# Patient Record
Sex: Female | Born: 1945 | Race: White | Hispanic: No | Smoking: Never smoker
Health system: Southern US, Community
[De-identification: ages and names within clinical notes are randomized; demographics above are authoritative.]

## PROBLEM LIST (undated history)

## (undated) DIAGNOSIS — I2721 Secondary pulmonary arterial hypertension: Secondary | ICD-10-CM

## (undated) DIAGNOSIS — F329 Major depressive disorder, single episode, unspecified: Secondary | ICD-10-CM

## (undated) DIAGNOSIS — I5030 Unspecified diastolic (congestive) heart failure: Secondary | ICD-10-CM

## (undated) DIAGNOSIS — N183 Chronic kidney disease, stage 3 unspecified: Secondary | ICD-10-CM

## (undated) DIAGNOSIS — G4733 Obstructive sleep apnea (adult) (pediatric): Secondary | ICD-10-CM

## (undated) DIAGNOSIS — I2510 Atherosclerotic heart disease of native coronary artery without angina pectoris: Secondary | ICD-10-CM

## (undated) DIAGNOSIS — R011 Cardiac murmur, unspecified: Secondary | ICD-10-CM

## (undated) DIAGNOSIS — I513 Intracardiac thrombosis, not elsewhere classified: Secondary | ICD-10-CM

## (undated) DIAGNOSIS — K219 Gastro-esophageal reflux disease without esophagitis: Secondary | ICD-10-CM

## (undated) DIAGNOSIS — E119 Type 2 diabetes mellitus without complications: Secondary | ICD-10-CM

## (undated) DIAGNOSIS — E785 Hyperlipidemia, unspecified: Secondary | ICD-10-CM

## (undated) DIAGNOSIS — R32 Unspecified urinary incontinence: Secondary | ICD-10-CM

## (undated) DIAGNOSIS — I739 Peripheral vascular disease, unspecified: Secondary | ICD-10-CM

## (undated) DIAGNOSIS — I10 Essential (primary) hypertension: Secondary | ICD-10-CM

## (undated) DIAGNOSIS — M1990 Unspecified osteoarthritis, unspecified site: Secondary | ICD-10-CM

## (undated) DIAGNOSIS — J302 Other seasonal allergic rhinitis: Secondary | ICD-10-CM

## (undated) DIAGNOSIS — I679 Cerebrovascular disease, unspecified: Secondary | ICD-10-CM

## (undated) DIAGNOSIS — I050 Rheumatic mitral stenosis: Secondary | ICD-10-CM

## (undated) DIAGNOSIS — I4819 Other persistent atrial fibrillation: Secondary | ICD-10-CM

## (undated) DIAGNOSIS — I639 Cerebral infarction, unspecified: Secondary | ICD-10-CM

## (undated) DIAGNOSIS — I779 Disorder of arteries and arterioles, unspecified: Secondary | ICD-10-CM

## (undated) DIAGNOSIS — K922 Gastrointestinal hemorrhage, unspecified: Secondary | ICD-10-CM

## (undated) DIAGNOSIS — F32A Depression, unspecified: Secondary | ICD-10-CM

## (undated) HISTORY — DX: Cardiac murmur, unspecified: R01.1

## (undated) HISTORY — DX: Essential (primary) hypertension: I10

## (undated) HISTORY — PX: KNEE SURGERY: SHX244

## (undated) HISTORY — DX: Unspecified osteoarthritis, unspecified site: M19.90

## (undated) HISTORY — DX: Chronic kidney disease, stage 3 (moderate): N18.3

## (undated) HISTORY — DX: Morbid (severe) obesity due to excess calories: E66.01

## (undated) HISTORY — DX: Other seasonal allergic rhinitis: J30.2

## (undated) HISTORY — DX: Chronic kidney disease, stage 3 unspecified: N18.30

## (undated) HISTORY — DX: Secondary pulmonary arterial hypertension: I27.21

## (undated) HISTORY — DX: Unspecified diastolic (congestive) heart failure: I50.30

## (undated) HISTORY — DX: Type 2 diabetes mellitus without complications: E11.9

## (undated) HISTORY — PX: BACK SURGERY: SHX140

## (undated) HISTORY — DX: Major depressive disorder, single episode, unspecified: F32.9

## (undated) HISTORY — DX: Hyperlipidemia, unspecified: E78.5

## (undated) HISTORY — DX: Gastrointestinal hemorrhage, unspecified: K92.2

## (undated) HISTORY — DX: Depression, unspecified: F32.A

## (undated) HISTORY — DX: Atherosclerotic heart disease of native coronary artery without angina pectoris: I25.10

## (undated) HISTORY — PX: CARDIAC CATHETERIZATION: SHX172

## (undated) HISTORY — DX: Obstructive sleep apnea (adult) (pediatric): G47.33

## (undated) HISTORY — DX: Unspecified urinary incontinence: R32

## (undated) HISTORY — DX: Rheumatic mitral stenosis: I05.0

## (undated) HISTORY — DX: Gastro-esophageal reflux disease without esophagitis: K21.9

## (undated) HISTORY — DX: Other persistent atrial fibrillation: I48.19

---
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
## 1999-02-01 ENCOUNTER — Other Ambulatory Visit: Admission: RE | Admit: 1999-02-01 | Discharge: 1999-02-01 | Payer: Self-pay | Admitting: Obstetrics & Gynecology

## 1999-04-07 ENCOUNTER — Ambulatory Visit (HOSPITAL_COMMUNITY): Admission: RE | Admit: 1999-04-07 | Discharge: 1999-04-07 | Payer: Self-pay | Admitting: *Deleted

## 1999-04-29 ENCOUNTER — Emergency Department (HOSPITAL_COMMUNITY): Admission: EM | Admit: 1999-04-29 | Discharge: 1999-04-29 | Payer: Self-pay | Admitting: Internal Medicine

## 1999-04-29 ENCOUNTER — Encounter: Payer: Self-pay | Admitting: Family Medicine

## 1999-04-29 ENCOUNTER — Encounter: Payer: Self-pay | Admitting: Internal Medicine

## 2001-07-10 ENCOUNTER — Encounter: Payer: Self-pay | Admitting: Family Medicine

## 2001-07-10 ENCOUNTER — Ambulatory Visit (HOSPITAL_COMMUNITY): Admission: RE | Admit: 2001-07-10 | Discharge: 2001-07-10 | Payer: Self-pay | Admitting: Family Medicine

## 2001-10-06 ENCOUNTER — Ambulatory Visit (HOSPITAL_COMMUNITY): Admission: RE | Admit: 2001-10-06 | Discharge: 2001-10-06 | Payer: Self-pay | Admitting: Cardiology

## 2001-10-22 ENCOUNTER — Ambulatory Visit (HOSPITAL_COMMUNITY): Admission: RE | Admit: 2001-10-22 | Discharge: 2001-10-22 | Payer: Self-pay | Admitting: Cardiology

## 2001-10-30 ENCOUNTER — Ambulatory Visit (HOSPITAL_COMMUNITY): Admission: RE | Admit: 2001-10-30 | Discharge: 2001-10-30 | Payer: Self-pay | Admitting: Cardiology

## 2001-11-05 ENCOUNTER — Ambulatory Visit (HOSPITAL_BASED_OUTPATIENT_CLINIC_OR_DEPARTMENT_OTHER): Admission: RE | Admit: 2001-11-05 | Discharge: 2001-11-05 | Payer: Self-pay | Admitting: Family Medicine

## 2003-03-07 ENCOUNTER — Emergency Department (HOSPITAL_COMMUNITY): Admission: EM | Admit: 2003-03-07 | Discharge: 2003-03-07 | Payer: Self-pay | Admitting: Emergency Medicine

## 2003-03-07 ENCOUNTER — Encounter: Payer: Self-pay | Admitting: Emergency Medicine

## 2004-12-06 ENCOUNTER — Emergency Department (HOSPITAL_COMMUNITY): Admission: EM | Admit: 2004-12-06 | Discharge: 2004-12-06 | Payer: Self-pay | Admitting: Emergency Medicine

## 2009-06-01 ENCOUNTER — Emergency Department (HOSPITAL_COMMUNITY): Admission: EM | Admit: 2009-06-01 | Discharge: 2009-06-01 | Payer: Self-pay | Admitting: Emergency Medicine

## 2011-01-05 LAB — DIFFERENTIAL
Basophils Absolute: 0 10*3/uL (ref 0.0–0.1)
Basophils Relative: 0 % (ref 0–1)
Eosinophils Absolute: 0.2 10*3/uL (ref 0.0–0.7)
Eosinophils Relative: 3 % (ref 0–5)
Lymphocytes Relative: 16 % (ref 12–46)
Monocytes Absolute: 0.5 10*3/uL (ref 0.1–1.0)

## 2011-01-05 LAB — CBC
HCT: 34.9 % — ABNORMAL LOW (ref 36.0–46.0)
Hemoglobin: 12.1 g/dL (ref 12.0–15.0)
MCHC: 34.7 g/dL (ref 30.0–36.0)
MCV: 87.6 fL (ref 78.0–100.0)
RDW: 13.2 % (ref 11.5–15.5)

## 2011-01-05 LAB — BASIC METABOLIC PANEL
CO2: 26 mEq/L (ref 19–32)
Glucose, Bld: 191 mg/dL — ABNORMAL HIGH (ref 70–99)
Potassium: 3.8 mEq/L (ref 3.5–5.1)
Sodium: 134 mEq/L — ABNORMAL LOW (ref 135–145)

## 2011-02-13 NOTE — Consult Note (Signed)
Megan Obrien, NORRICK            ACCOUNT NO.:  [id number]   MEDICAL RECORD NO.:  [id number]          PATIENT TYPE:  EMS   LOCATION:  ED                            FACILITY:  [facility]   PHYSICIAN:  Chelsea Primus, MD      DATE OF BIRTH:  01-08-1946   DATE OF CONSULTATION:  06/01/2009  DATE OF DISCHARGE:  06/01/2009                                 CONSULTATION   REASON FOR CONSULTATION:  Laceration in the right neck and submandibular  area.   HISTORY OF PRESENT ILLNESS:  The patient is a 65 year old female with  multiple medical problems and has been experiencing some dizziness.  She  was earlier in the morning getting out of bed to use the restroom, lost  her balance and had fallen striking a piece of furniture on the left  side of her face.  No loss of consciousness, but the patient has had  significant amount of bleeding and was therefore brought to [facility]  Emergency Department for further evaluation.  She had been evaluated by  the emergency room physician, who attempted the initial closure.  However, was unable to obtain hemostasis and therefore consultation was  urgently requested.   The patient's past medical, past surgical, medications are reviewed in  the patient's chart and briefly with the patient, who is somewhat  somnolent secondary to pain medication for the procedure.   ALLERGIES:  Reviewed.   FOCUSED PHYSICAL EXAM:  GENERAL:  The patient is in a supine position in  the emergency room cart.  She is arousable and alert, oriented once  awake.  She is not in any acute distress.  HEENT:  Scalp, no deformities.  No masses.  Eyes, pupils are equal,  round, reactive.  Extraocular movements are intact.  Oral mucosa is  pink.  On inspection of the mouth, there is area of notable bleeding and  appears to be fresh, lost a molar on the right side of the lower jaw.  Additionally, there is notable bleeding from a large mucosal defect near  the mucosal edge on the mandible.   Externally, the inspection of the  face demonstrates a large angulated laceration over the anterior to the  mandible on the right side, this was approximately mid way along the  mandible.  This is relatively deep.  There is somewhat undermining of  the skin edge, soft tissue, and muscles evidence.  There is significant  bleeding upon removal of dressing, although this is nonpulsatile.  There  is a through-and-through wound in this area.  NECK:  Trachea is midline.  No cervical lymphadenopathy.  No trauma or  abnormalities noted at the chest wall.  PULMONARY:  Unlabored respirations.  Chest clear to auscultation.  CARDIOVASCULAR:  Regular rate and rhythm.   ASSESSMENT/PLAN:  Complex laceration of the face involving a through-and-  through defect on the right premandibular region.  Plan at this time,  the patient will continue on analgesia and plans will be for three-layer  closure of the wound, this is discussed with the patient and plans are  to proceed at this time with closure.  Chelsea Primus, MD  Electronically Signed     BZ/MEDQ  D:  06/08/2009  T:  06/09/2009  Job:  [id number]

## 2011-02-13 NOTE — Op Note (Signed)
NAMEKATHYREN, MARSIGLIA            ACCOUNT NO.:  [id number]   MEDICAL RECORD NO.:  [id number]          PATIENT TYPE:  EMS   LOCATION:  ED                            FACILITY:  [facility]   PHYSICIAN:  Chelsea Primus, MD      DATE OF BIRTH:  1945-12-15   DATE OF PROCEDURE:  06/01/2009  DATE OF DISCHARGE:  06/01/2009                               OPERATIVE REPORT   PREPROCEDURE DIAGNOSIS:  Complex laceration of the right face.   POSTPROCEDURE DIAGNOSIS:  Complex laceration of the right face.   PROCEDURE:  Three-layer closure of the laceration of the right face.   SURGEON:  Chelsea Primus, MD   ANESTHESIA:  1% lidocaine with epinephrine.   SPECIMEN:  None.   ESTIMATED BLOOD LOSS:  Less than 200 mL during the procedure.   INDICATIONS:  The patient is a 65 year old female, status post fall, who  hit the edge of furniture during the fall causing the laceration.  Risks, benefits, and alternatives of the closure were discussed and at  this time plans are for closure.   PROCEDURE:  The wound was cleaned with both sterile saline as well as  Betadine solution and to the region the area was then anesthetized with  local anesthetic.  Due to ongoing intraoral bleeding, I did opt to close  this aspect first using a 4-0 chromic suture in interrupted fashion to  reapproximate the mucosal lining.  At this time, the multiple sutures  were required to obtain a closure.  Upon closing the mucosa, hemostasis  was excellent intraorally.  At this time, I turned my attention to the  deep subcuticular tissue.  Inspection of the wound did not demonstrate  any evidence of any muscular defects or involvement of any salivary  glands at this time.  A 4-0 Vicryl was utilized to reapproximate the  deep subcu tissue again in a simple interrupted fashion with this tissue  reapproximated the wound again was irrigated and a 4-0 nylon was  utilized to reapproximate the skin edges.  At the maximum point of  angulation, I  did place a simple interrupted suture to identify the apex  of the skin defect and to properly align the tissue.  Several short  series of continuous running suture was then utilized to approximate the  skin edges.  Three  separate short runs were utilized to reapproximate the skin edges.  With  the skin reapproximated, the skin was washed and dried with moist dry  towel.  Betadine ointment was placed over the dressing.  The patient  tolerated the procedure extremely well.  All structures disposed were in  proper accordance.      Chelsea Primus, MD  Electronically Signed     BZ/MEDQ  D:  06/08/2009  T:  06/09/2009  Job:  [id number]

## 2011-02-16 NOTE — Cardiovascular Report (Signed)
[place]. [facility]  Patient:    Megan Obrien, Megan Obrien Visit Number: [id number] MRN: [id number]          Service Type: CAT Location: [place] 01 Attending Physician:  Rodell Perna Dictated by:   Barnett Abu, M.D. Proc. Date: 10/30/00 Admit Date:  10/30/2001 Discharge Date: 10/30/2001   CC:         Fransico Him, M.D.  Briscoe Deutscher, M.D.  Cardiac Catheterization Laboratory   Cardiac Catheterization  PROCEDURES PERFORMED: 1. Left heart catheterization. 2. Coronary angiography. 3. Left ventriculogram via the right brachial approach.  INDICATIONS:  Megan Obrien is a 65 year old woman with diabetes and worsening exertional dyspnea, as well as morbid obesity.  A myocardial perfusion study revealed reversible anterior and lateral wall defect.  She is brought now to the catheterization laboratory to identify the extent of disease and to provide for further therapeutic options.  PROCEDURAL NOTE:  Left heart catheterization was performed following the percutaneous insertion of a 6-French catheter sheath utilizing an anterior approach over a guiding J-wire into the right brachial artery.  The patient then received 2500 units of heparin intra-arterially.  A 6-French 100 cm pigtail catheter was used to measure pressures in the ascending aorta and in the left ventricle both prior to and following the ventriculogram.  A 30 degree RAO cine left ventriculogram was performed utilizing a power injector.  All catheter manipulations were performed using fluoroscopic observation and exchanges performed over a long guiding J-wire.  A 6-French #2 Harvest Dark Amplatz catheter was used for angiography of left coronary artery.  Cineangiography of the left coronary artery was performed in multiple LAO and RAO projections.  The #2 Harvest Dark was exchanged for a #1 Castillo.  Cineangiography of the right coronary artery was conducted in the LAO and RAO projections.  At  the completion of the procedure, the catheter and catheter sheath was removed.  Hemostasis was achieved by direct pressure.  The patient was transported to the recovery area in stable condition with an intact radial pulse.  HEMODYNAMICS:  Systemic arterial pressure was 204/108, with a mean of 150 mmHg.  There was no systolic gradient across the aortic valve.  The left ventricular end-diastolic pressure was 30 mmHg.  ANGIOGRAPHY:  LEFT VENTRICULOGRAM:  The left ventriculogram demonstrated normal global systolic function with a visual estimate of the ejection fraction at 75%. There was concentric moderate LVH present.  There was no coronary calcification or mitral valve regurgitation seen.  CORONARY ARTERIES:  There was a right dominant coronary system present.  The main left coronary artery was normal.  The left anterior descending and its branches were normal.  The left circumflex coronary artery and its branches were normal.  The right coronary artery and its branches were normal.  It should be noted that there was extensive tortuosity of the distal portions of the vessel, indicative of her longstanding hypertension.  FINAL IMPRESSIONS: 1. Systemic hypertension. 2. Concentric moderate left ventricular hypertrophy. 3. Intact left ventricular systolic function. 4. Elevated left ventricular end-diastolic pressure. 5. No evidence of epicardial coronary disease.  PLAN/RECOMMENDATIONS:  The patient is discharged on an outpatient basis with followup with Dr. Radford Pax and Dr. Maceo Pro.  She is reassured by these results and understands that her dyspnea is most likely due to pulmonary complications secondary to her obesity. Dictated by:   Barnett Abu, M.D. Attending Physician:  Rodell Perna DD:  10/30/01 TD:  10/31/01 Job: [id number]

## 2011-06-05 ENCOUNTER — Ambulatory Visit (HOSPITAL_COMMUNITY)
Admission: RE | Admit: 2011-06-05 | Discharge: 2011-06-05 | Disposition: A | Discharge status: Home / Self Care | Payer: Medicare Other | Source: Ambulatory Visit | Attending: Gastroenterology | Admitting: Gastroenterology

## 2011-06-05 DIAGNOSIS — K259 Gastric ulcer, unspecified as acute or chronic, without hemorrhage or perforation: Secondary | ICD-10-CM | POA: Insufficient documentation

## 2011-06-05 DIAGNOSIS — K2960 Other gastritis without bleeding: Secondary | ICD-10-CM | POA: Insufficient documentation

## 2011-06-05 DIAGNOSIS — K5730 Diverticulosis of large intestine without perforation or abscess without bleeding: Secondary | ICD-10-CM | POA: Insufficient documentation

## 2011-06-05 DIAGNOSIS — I10 Essential (primary) hypertension: Secondary | ICD-10-CM | POA: Insufficient documentation

## 2011-06-05 DIAGNOSIS — E785 Hyperlipidemia, unspecified: Secondary | ICD-10-CM | POA: Insufficient documentation

## 2011-06-05 DIAGNOSIS — Z79899 Other long term (current) drug therapy: Secondary | ICD-10-CM | POA: Insufficient documentation

## 2011-06-05 DIAGNOSIS — D649 Anemia, unspecified: Secondary | ICD-10-CM | POA: Insufficient documentation

## 2011-06-05 DIAGNOSIS — E119 Type 2 diabetes mellitus without complications: Secondary | ICD-10-CM | POA: Insufficient documentation

## 2011-06-06 LAB — CLOTEST (H. PYLORI), BIOPSY: Helicobacter screen: NEGATIVE

## 2011-06-24 NOTE — Op Note (Signed)
  [name], Megan Obrien            ACCOUNT NO.:  [id number]  MEDICAL RECORD NO.:  [id number]  LOCATION:  WLEN                         FACILITY:  [facility]  PHYSICIAN:  Yariel Ferraris C. Amedeo Plenty, M.D.    DATE OF BIRTH:  11/04/1945  DATE OF PROCEDURE:  06/05/2011 DATE OF DISCHARGE:                              OPERATIVE REPORT   PROCEDURE:  Colonoscopy.  REASON FOR CONSULTATION:  Anemia.  DESCRIPTION OF PROCEDURE:  The patient was placed in the left lateral decubitus position and placed on the pulse monitor with continuous low- flow oxygen delivered by nasal cannula.  She was sedated with 50 mcg IV fentanyl and 4 mg IV Versed in addition to the medicine given for the previous EGD.  Olympus video colonoscope was inserted into the rectum and advanced to cecum, confirmed by transillumination of McBurney point and visualization of ileocecal valve and appendiceal orifice.  The prep was good.  The cecum and ascending colon appeared normal with no masses,polyps, diverticula, or other mucosal abnormalities.  Thus in the transverse colon, there were seen a few scattered diverticula and these were more numerous in the descending sigmoid colon.  No other abnormalities were seen in the transverse, descending, sigmoid, or rectum.  Scope was then withdrawn and the patient returned to the recovery room in stable condition.  She tolerated the procedure well. There were no immediate complications.  IMPRESSION:  Predominantly left-sided diverticulosis, otherwise normal study.  PLAN:  Repeat screening colonoscopy in 10 years.          ______________________________ Elyse Jarvis Amedeo Plenty, M.D.     JCH/MEDQ  D:  06/05/2011  T:  06/05/2011  Job:  [id number]  Electronically Signed by Teena Irani M.D. on 06/24/2011 11:40:10 AM

## 2011-06-24 NOTE — Op Note (Signed)
  [name], Megan Obrien            ACCOUNT NO.:  [id number]  MEDICAL RECORD NO.:  [id number]  LOCATION:  WLEN                         FACILITY:  [facility]  PHYSICIAN:  Cristiana Yochim C. Amedeo Plenty, M.D.    DATE OF BIRTH:  12/12/1945  DATE OF PROCEDURE:  06/05/2011 DATE OF DISCHARGE:                              OPERATIVE REPORT   PROCEDURE:  Esophagogastroduodenoscopy with biopsy.  INDICATION FOR PROCEDURE:  Significant microcytic anemia in a patient taking nonsteroidal anti-inflammatory drugs.  PROCEDURE IN DETAIL:  The patient was placed in the left lateral decubitus position and placed on the pulse monitor with continuous low- flow oxygen delivered via nasal cannula.  She was sedated with 50 mcg IV fentanyl and 4 mg IV Versed.  Olympus video endoscope was advanced under direct vision into the oropharynx and esophagus.  The esophagus was straight and of normal caliber with squamocolumnar line at 38 cm.  There was no visible hiatal hernia, ring, stricture, or other abnormality of the GE junction.  The stomach was entered and a small amount of liquid secretions were suctioned from the fundus.  Retroflexed view of cardia was unremarkable.  The fundus, body appeared normal.  The antrum showed some erythema and a few punctate erosions with 1 or 2 showing a tiny bit of central exudate.  In addition, there was a better define large prepyloric to pyloric channel ulcer about 4 mm in diameter with clean base.  This showed some mild deformity of the duodenal bulb and some mild-to-moderate difficulty in passage of the scope tip into the duodenum.  There was no stigma of hemorrhage.  Both bulb and second portion were inspected and appeared to be within normal limits.  The scope was then withdrawn and the patient returned to the recovery room in stable condition.  She tolerated the procedure well and there were no immediate complications.  IMPRESSION: 1. Pyloric channel ulcer. 2. Antral gastritis with  erosions.  PLAN:  Await CLO-test and treat with proton pump inhibitor, otherwise while minimizing use of nonsteroidal anti-inflammatory drugs if possible.          ______________________________ Elyse Jarvis. Amedeo Plenty, M.D.     JCH/MEDQ  D:  06/05/2011  T:  06/05/2011  Job:  [id number]  Electronically Signed by Teena Irani M.D. on 06/24/2011 11:40:13 AM

## 2012-07-30 ENCOUNTER — Other Ambulatory Visit: Payer: Self-pay | Admitting: Family Medicine

## 2012-07-30 DIAGNOSIS — Z1231 Encounter for screening mammogram for malignant neoplasm of breast: Secondary | ICD-10-CM

## 2012-09-04 ENCOUNTER — Ambulatory Visit
Admission: RE | Admit: 2012-09-04 | Discharge: 2012-09-04 | Disposition: A | Discharge status: Home / Self Care | Payer: Medicare Other | Source: Ambulatory Visit | Attending: Family Medicine | Admitting: Family Medicine

## 2012-09-04 DIAGNOSIS — Z1231 Encounter for screening mammogram for malignant neoplasm of breast: Secondary | ICD-10-CM

## 2014-10-04 ENCOUNTER — Other Ambulatory Visit: Payer: Self-pay

## 2014-10-04 DIAGNOSIS — Z1231 Encounter for screening mammogram for malignant neoplasm of breast: Secondary | ICD-10-CM

## 2014-10-14 ENCOUNTER — Ambulatory Visit
Admission: RE | Admit: 2014-10-14 | Discharge: 2014-10-14 | Disposition: A | Discharge status: Home / Self Care | Payer: Medicare Other | Source: Ambulatory Visit

## 2014-10-14 ENCOUNTER — Encounter (INDEPENDENT_AMBULATORY_CARE_PROVIDER_SITE_OTHER): Payer: Self-pay

## 2014-10-14 DIAGNOSIS — Z1231 Encounter for screening mammogram for malignant neoplasm of breast: Secondary | ICD-10-CM

## 2015-09-30 ENCOUNTER — Telehealth: Payer: Self-pay | Admitting: Family Medicine

## 2015-09-30 NOTE — Telephone Encounter (Signed)
Error

## 2015-10-16 DIAGNOSIS — G4730 Sleep apnea, unspecified: Secondary | ICD-10-CM | POA: Diagnosis not present

## 2015-11-01 ENCOUNTER — Telehealth: Payer: Self-pay | Admitting: Family Medicine

## 2015-11-01 NOTE — Telephone Encounter (Signed)
Notified patient no, cannot legaly prescribe til seen. Patient verbalized understanding.

## 2015-11-01 NOTE — Telephone Encounter (Signed)
NO, cannot legealy prescribe til seen

## 2015-11-01 NOTE — Telephone Encounter (Signed)
No documented visit in [place]. No meds on med list. Patient has no documented visit to establish care.

## 2015-11-01 NOTE — Telephone Encounter (Signed)
Pt is a new pt with [place] and has not been seen yet. Pt had an appt for 2/7 but her daughter in law can not bring her until 3/7 due to her job giving her the wrong day off. Pt is needing refills on some of her medications and is wanting to know if we can refill them to last her to 3/7.   OMEPRAZOLE 40 MG CAP PRAVASTATIN 40 MG TAB CARVEDILOL 6.25 MG TAB LISINOPRIL 20-12.5 MG TAB GLIMEPIRIDE 2 MG TAB FLUOXETINE 40 MG CAP   [facility] [place]

## 2015-11-08 ENCOUNTER — Ambulatory Visit: Payer: Self-pay | Admitting: Family Medicine

## 2015-11-16 DIAGNOSIS — G4730 Sleep apnea, unspecified: Secondary | ICD-10-CM | POA: Diagnosis not present

## 2015-12-06 ENCOUNTER — Ambulatory Visit: Payer: Self-pay | Admitting: Family Medicine

## 2015-12-14 DIAGNOSIS — G4730 Sleep apnea, unspecified: Secondary | ICD-10-CM | POA: Diagnosis not present

## 2016-01-14 DIAGNOSIS — G4730 Sleep apnea, unspecified: Secondary | ICD-10-CM | POA: Diagnosis not present

## 2016-02-06 ENCOUNTER — Ambulatory Visit (INDEPENDENT_AMBULATORY_CARE_PROVIDER_SITE_OTHER): Payer: Medicare Other | Admitting: Primary Care

## 2016-02-06 ENCOUNTER — Encounter: Payer: Self-pay | Admitting: Primary Care

## 2016-02-06 VITALS — BP 136/70 | HR 61 | Temp 97.7°F | Ht 66.0 in | Wt 244.1 lb

## 2016-02-06 DIAGNOSIS — E118 Type 2 diabetes mellitus with unspecified complications: Secondary | ICD-10-CM

## 2016-02-06 DIAGNOSIS — I10 Essential (primary) hypertension: Secondary | ICD-10-CM

## 2016-02-06 DIAGNOSIS — N3941 Urge incontinence: Secondary | ICD-10-CM

## 2016-02-06 DIAGNOSIS — F329 Major depressive disorder, single episode, unspecified: Secondary | ICD-10-CM | POA: Diagnosis not present

## 2016-02-06 DIAGNOSIS — K219 Gastro-esophageal reflux disease without esophagitis: Secondary | ICD-10-CM

## 2016-02-06 DIAGNOSIS — E785 Hyperlipidemia, unspecified: Secondary | ICD-10-CM | POA: Diagnosis not present

## 2016-02-06 DIAGNOSIS — F32A Depression, unspecified: Secondary | ICD-10-CM

## 2016-02-06 MED ORDER — GLIMEPIRIDE 2 MG PO TABS: 2.0000 mg | ORAL_TABLET | Freq: Every day | ORAL | Status: DC

## 2016-02-06 MED ORDER — OMEPRAZOLE 40 MG PO CPDR: 40.0000 mg | DELAYED_RELEASE_CAPSULE | Freq: Every day | ORAL | Status: DC

## 2016-02-06 MED ORDER — FLUOXETINE HCL 40 MG PO CAPS: 40.0000 mg | ORAL_CAPSULE | Freq: Every day | ORAL | Status: DC

## 2016-02-06 MED ORDER — LISINOPRIL-HYDROCHLOROTHIAZIDE 20-12.5 MG PO TABS: 1.0000 | ORAL_TABLET | Freq: Every day | ORAL | Status: DC

## 2016-02-06 MED ORDER — PRAVASTATIN SODIUM 40 MG PO TABS: 40.0000 mg | ORAL_TABLET | Freq: Every day | ORAL | Status: DC

## 2016-02-06 MED ORDER — OXYBUTYNIN CHLORIDE 5 MG PO TABS: 5.0000 mg | ORAL_TABLET | Freq: Three times a day (TID) | ORAL | Status: DC

## 2016-02-06 MED ORDER — METFORMIN HCL 1000 MG PO TABS: 1000.0000 mg | ORAL_TABLET | Freq: Two times a day (BID) | ORAL | Status: DC

## 2016-02-06 MED ORDER — CARVEDILOL 6.25 MG PO TABS: 12.5000 mg | ORAL_TABLET | Freq: Two times a day (BID) | ORAL | Status: DC

## 2016-02-06 NOTE — Assessment & Plan Note (Signed)
Managed on Fluoxetine for years. Feels well managed, denies SI/HI. Continue same.

## 2016-02-06 NOTE — Assessment & Plan Note (Signed)
Currently managed on Carvedilol 12.5 mg BID, and hctz/lisinpril combo. HR remains in low 60's on average. Will have her reduce carvedilol to 1 tablet BID as she's nearly fallen a few times due to dizziness. She is to check her BP and HR over the next 2 weeks and call in with readings. BP stable today.

## 2016-02-06 NOTE — Progress Notes (Signed)
Subjective:    Patient ID: Megan Obrien, female    DOB: May 24, 1946, 70 y.o.   MRN: [id number]  HPI  Megan Obrien is a 70 year old female who presents today to establish care and discuss the problems mentioned below. Will obtain old records. Her last Wellness Exam was in December 2016.   1) Essential Hypertension: Diagnosed years ago. Currently managed on carvedilol 6.25 (2 tabs) mg BID, Lisinopril/HCTZ 20/12.5 mg. She will check her BP occasionally at home with stable readings. Denies chest pain, dizziness, shortness of breath. She does have occasional headaches. She's noticed her HR in the low 60's on average.  2) Type 2 Diabetes: Diagnosed 20 years ago. Currently managed on Glimepiride 2 mg, Metformin 1000 mg BID. She is unsure of her last A1C. She checks her sugars at home once to twice daily and will get readings of 120's-140's. She does have numbness to her right hand and has a history of diabetic neuropathy to her feet. She was treated for diabetic neuropathy in the past with an "unknown" medication.   3) Depression: Diagnosed years ago and is currently managed on Fluoxetine 40 mg. She's been on the Fluoxetine 40 mg for the past 20 years and feels well managed at her current dose. Denies daily tearfulness, SI/HI, daily sadness.    4) Hyperlipidemia: Currently managed on pravastatin 40 mg. Her last lipid panel was in December 2016. She believes it was normal. Denies myalgias.   5) Urinary Incontinence: She is currently managed on oxybutynin 5 mg. Stress and urge incontinence. Wears pads/panty liners for accidents. She does not travel outside of her home much because of this.  Review of Systems  Constitutional: Negative for unexpected weight change.  Respiratory: Negative for shortness of breath.   Cardiovascular: Negative for chest pain.  Genitourinary:       Incontinence  Musculoskeletal: Positive for arthralgias.  Neurological: Positive for headaches. Negative for dizziness.      Diabetic neuropathy.  Psychiatric/Behavioral: Negative for suicidal ideas.       Past Medical History  Diagnosis Date  . GERD (gastroesophageal reflux disease)   . Type 2 diabetes mellitus ([place])   . Depression   . Hyperlipidemia   . Arthritis   . Seasonal allergies   . Urinary incontinence   . Murmur      Social History   Social History  . Marital Status: Widowed    Spouse Name: N/A  . Number of Children: N/A  . Years of Education: N/A   Occupational History  . Not on file.   Social History Main Topics  . Smoking status: Never Smoker   . Smokeless tobacco: Not on file  . Alcohol Use: 0.0 oz/week    0 Standard drinks or equivalent per week     Comment: rarely  . Drug Use: No  . Sexual Activity: Not on file   Other Topics Concern  . Not on file   Social History Narrative   Widow.   2 children, numerous grandchildren.   Retired. Once worked on a Publishing copy.   Enjoys playing computer games, spending time with family.    Past Surgical History  Procedure Laterality Date  . Tonsillectomy and adenoidectomy  1951  . Knee surgery Left   . Back surgery      Family History  Problem Relation Age of Onset  . Lung cancer Father   . Alzheimer's disease Mother     Allergies not on file  No current outpatient prescriptions  on file prior to visit.   No current facility-administered medications on file prior to visit.    BP 136/70 mmHg  Pulse 61  Temp(Src) 97.7 F (36.5 C) (Oral)  Ht 5\' 6"  (1.676 m)  Wt 244 lb 1.9 oz (110.732 kg)  BMI 39.42 kg/m2  SpO2 96%    Objective:   Physical Exam  Constitutional: She appears well-nourished.  Neck: Neck supple.  Cardiovascular: Normal rate and regular rhythm.   Pulmonary/Chest: Effort normal and breath sounds normal.  Skin: Skin is warm and dry.  Psychiatric: She has a normal mood and affect.          Assessment & Plan:

## 2016-02-06 NOTE — Patient Instructions (Signed)
I've sent refills for all of your medications.  I will obtain records from [place] and determine if/when we should obtain a repeat echocardiogram.  Follow up in December 2017 for your annual Wellness Visit or sooner if needed.  It was a pleasure to meet you today! Please don't hesitate to call me with any questions. Welcome to [facility]!

## 2016-02-06 NOTE — Assessment & Plan Note (Signed)
Managed on pravastatin 40 mg. Continue same. Will obtain records for most recent lipid panel.

## 2016-02-06 NOTE — Assessment & Plan Note (Signed)
Managed on omeprazole 40 mg. Continue current regimen for now.

## 2016-02-06 NOTE — Assessment & Plan Note (Signed)
Managed on Glimiperide and Metformin. Endorses normal kidney function and A1C that was checked in December 2016. Will obtain records for further evaluation. Continue current regimen. Managed on ACE and statin.

## 2016-02-06 NOTE — Progress Notes (Signed)
Pre visit review using our clinic review tool, if applicable. No additional management support is needed unless otherwise documented below in the visit note. 

## 2016-02-06 NOTE — Assessment & Plan Note (Signed)
Managed on oxybutynin for years, also wears pads during the day. Continue current regimen as she believes this does help to reduce symptoms.

## 2016-02-13 DIAGNOSIS — G4730 Sleep apnea, unspecified: Secondary | ICD-10-CM | POA: Diagnosis not present

## 2016-02-20 ENCOUNTER — Telehealth: Payer: Self-pay | Admitting: Primary Care

## 2016-02-20 NOTE — Telephone Encounter (Signed)
Noted. Will continue to monitor. Please have her notify me if she continues to see numbers at or greater than 150/90 as that is too high.

## 2016-02-20 NOTE — Telephone Encounter (Signed)
-----   Message from Pleas Koch, NP sent at 02/06/2016 11:13 AM EDT ----- Regarding: BP Check on BP and heart rate readings since we reduced carvediolol to 6.25 mg BID.

## 2016-02-20 NOTE — Telephone Encounter (Signed)
Spoken to patient and she checked it today. BP was 150/75, pulse 65  Patient is doing fine and have not been dizzy.

## 2016-02-20 NOTE — Telephone Encounter (Signed)
Spoken and notified patient of Kate's comments. Patient verbalized understanding. 

## 2016-03-15 DIAGNOSIS — G4730 Sleep apnea, unspecified: Secondary | ICD-10-CM | POA: Diagnosis not present

## 2016-04-14 DIAGNOSIS — G4730 Sleep apnea, unspecified: Secondary | ICD-10-CM | POA: Diagnosis not present

## 2016-05-15 DIAGNOSIS — G4730 Sleep apnea, unspecified: Secondary | ICD-10-CM | POA: Diagnosis not present

## 2016-06-15 DIAGNOSIS — G4730 Sleep apnea, unspecified: Secondary | ICD-10-CM | POA: Diagnosis not present

## 2016-07-15 DIAGNOSIS — G4730 Sleep apnea, unspecified: Secondary | ICD-10-CM | POA: Diagnosis not present

## 2016-07-17 ENCOUNTER — Telehealth: Payer: Self-pay

## 2016-07-17 MED ORDER — GLUCOSE BLOOD VI STRP: ORAL_STRIP | 0 refills | Status: DC

## 2016-07-17 NOTE — Telephone Encounter (Signed)
Pt is almost out of accuchek aviva plus test strips and request refill to [facility] [address]. Pt has wellness exam later this month. Pt established care 02/06/16. Refilled x 1 and pt voiced understanding.

## 2016-07-24 ENCOUNTER — Ambulatory Visit (INDEPENDENT_AMBULATORY_CARE_PROVIDER_SITE_OTHER): Payer: Medicare Other | Admitting: Primary Care

## 2016-07-24 ENCOUNTER — Encounter: Payer: Self-pay | Admitting: Primary Care

## 2016-07-24 ENCOUNTER — Other Ambulatory Visit: Payer: Self-pay | Admitting: Primary Care

## 2016-07-24 VITALS — BP 130/70 | HR 60 | Temp 97.7°F | Ht 66.0 in | Wt 244.0 lb

## 2016-07-24 DIAGNOSIS — R300 Dysuria: Secondary | ICD-10-CM

## 2016-07-24 DIAGNOSIS — I10 Essential (primary) hypertension: Secondary | ICD-10-CM

## 2016-07-24 DIAGNOSIS — Z23 Encounter for immunization: Secondary | ICD-10-CM

## 2016-07-24 DIAGNOSIS — E119 Type 2 diabetes mellitus without complications: Secondary | ICD-10-CM | POA: Diagnosis not present

## 2016-07-24 DIAGNOSIS — E2839 Other primary ovarian failure: Secondary | ICD-10-CM | POA: Diagnosis not present

## 2016-07-24 DIAGNOSIS — Z1231 Encounter for screening mammogram for malignant neoplasm of breast: Secondary | ICD-10-CM

## 2016-07-24 DIAGNOSIS — Z1239 Encounter for other screening for malignant neoplasm of breast: Secondary | ICD-10-CM

## 2016-07-24 DIAGNOSIS — E785 Hyperlipidemia, unspecified: Secondary | ICD-10-CM | POA: Diagnosis not present

## 2016-07-24 DIAGNOSIS — R809 Proteinuria, unspecified: Principal | ICD-10-CM

## 2016-07-24 DIAGNOSIS — N3941 Urge incontinence: Secondary | ICD-10-CM

## 2016-07-24 DIAGNOSIS — Z0000 Encounter for general adult medical examination without abnormal findings: Secondary | ICD-10-CM | POA: Insufficient documentation

## 2016-07-24 DIAGNOSIS — E1129 Type 2 diabetes mellitus with other diabetic kidney complication: Secondary | ICD-10-CM

## 2016-07-24 DIAGNOSIS — E118 Type 2 diabetes mellitus with unspecified complications: Secondary | ICD-10-CM

## 2016-07-24 LAB — COMPREHENSIVE METABOLIC PANEL
ALT: 7 U/L (ref 0–35)
AST: 11 U/L (ref 0–37)
Albumin: 3.9 g/dL (ref 3.5–5.2)
Alkaline Phosphatase: 52 U/L (ref 39–117)
BUN: 27 mg/dL — AB (ref 6–23)
CHLORIDE: 103 meq/L (ref 96–112)
CO2: 28 mEq/L (ref 19–32)
Calcium: 9.7 mg/dL (ref 8.4–10.5)
Creatinine, Ser: 1.34 mg/dL — ABNORMAL HIGH (ref 0.40–1.20)
GFR: 41.37 mL/min — ABNORMAL LOW (ref >60.00)
GLUCOSE: 132 mg/dL — AB (ref 70–99)
POTASSIUM: 4.9 meq/L (ref 3.5–5.1)
SODIUM: 138 meq/L (ref 135–145)
Total Bilirubin: 0.8 mg/dL (ref 0.2–1.2)
Total Protein: 7.3 g/dL (ref 6.0–8.3)

## 2016-07-24 LAB — POC URINALSYSI DIPSTICK (AUTOMATED)
Bilirubin, UA: NEGATIVE
Glucose, UA: NEGATIVE
KETONES UA: NEGATIVE
NITRITE UA: NEGATIVE
PH UA: 6
Spec Grav, UA: >=1.03
Urobilinogen, UA: NEGATIVE

## 2016-07-24 LAB — MICROALBUMIN / CREATININE URINE RATIO
Creatinine,U: 254.6 mg/dL
Microalb Creat Ratio: 91.9 mg/g — ABNORMAL HIGH (ref 0.0–30.0)
Microalb, Ur: 234 mg/dL — ABNORMAL HIGH (ref 0.0–1.9)

## 2016-07-24 LAB — LIPID PANEL
CHOL/HDL RATIO: 4
Cholesterol: 156 mg/dL (ref 0–200)
HDL: 44.6 mg/dL (ref >39.00)
LDL Cholesterol: 72 mg/dL (ref 0–99)
NONHDL: 111.63
TRIGLYCERIDES: 196 mg/dL — AB (ref 0.0–149.0)
VLDL: 39.2 mg/dL (ref 0.0–40.0)

## 2016-07-24 LAB — HEMOGLOBIN A1C: HEMOGLOBIN A1C: 8.2 % — AB (ref 4.6–6.5)

## 2016-07-24 MED ORDER — CEPHALEXIN 500 MG PO CAPS: 500.0000 mg | ORAL_CAPSULE | Freq: Two times a day (BID) | ORAL | 0 refills | Status: DC

## 2016-07-24 MED ORDER — ZOSTER VACCINE LIVE 19400 UNT/0.65ML ~~LOC~~ SUSR: 0.6500 mL | Freq: Once | SUBCUTANEOUS | 0 refills | Status: AC

## 2016-07-24 MED ORDER — GLIMEPIRIDE 4 MG PO TABS: 4.0000 mg | ORAL_TABLET | Freq: Every day | ORAL | 0 refills | Status: DC

## 2016-07-24 NOTE — Assessment & Plan Note (Signed)
Lipids pending today, continue pravastatin.

## 2016-07-24 NOTE — Patient Instructions (Addendum)
Complete lab work prior to leaving today. I will notify you of your results once received.   You will be contacted regarding your mammogram and bone density testing.  Please let us know if you have not heard back within one week.   Take the shingles vaccination to your pharmacy to inquire about price.  Increase consumption of vegetables, fruit, whole grains.   Start walking daily for 15-20 minutes as tolerated.  Your urine shows evidence of infection. Start Cephalexin antibiotics. Take 1 tablet by mouth twice daily for 7 days.  I will be in touch with you regarding your results once received.  It was a pleasure to see you today!

## 2016-07-24 NOTE — Assessment & Plan Note (Signed)
Immunizations UTD except for influenza (administered today) and Zostavax (too expensive in the past). Rx printed for Zostavax for her to inquire price at her pharmacy. Mammogram and Bone Density testing due, ordered and pending. Colonoscopy UTD. Declines Pap given age. Due for annual eye exam, patient will schedule. Discussed importance of diabetic diet and regular exercise. Exam unremarkable, labs pending. Recommendations provided at end of visit.  I have personally reviewed and have noted: 1. The patient's medical and social history 2. Their use of alcohol, tobacco or illicit drugs 3. Their current medications and supplements 4. The patient's functional ability including ADL's, fall  risks, home safety risks and hearing or visual  impairment. 5. Diet and physical activities 6. Evidence for depression or mood disorder

## 2016-07-24 NOTE — Assessment & Plan Note (Signed)
Due for repeat A1C today, urine microalbumin pending. Managed on ACE and statin. Pneumonia vaccinations UTD. Continue Metformin and Glimepiride.

## 2016-07-24 NOTE — Progress Notes (Signed)
Patient ID: Megan Obrien, female   DOB: 12-13-1945, 70 y.o.   MRN: [id number]  HPI: [name] is a 70 year old female who presents today for her annual wellness visit.  Past Medical History:  Diagnosis Date  . Arthritis   . Depression   . GERD (gastroesophageal reflux disease)   . Hyperlipidemia   . Murmur   . Seasonal allergies   . Type 2 diabetes mellitus ([place])   . Urinary incontinence     Current Outpatient Prescriptions  Medication Sig Dispense Refill  . carvedilol (COREG) 6.25 MG tablet Take 2 tablets (12.5 mg total) by mouth 2 (two) times daily with a meal. 360 tablet 2  . FLUoxetine (PROZAC) 40 MG capsule Take 1 capsule (40 mg total) by mouth daily. 90 capsule 2  . glimepiride (AMARYL) 2 MG tablet Take 1 tablet (2 mg total) by mouth daily with breakfast. 90 tablet 2  . glucose blood (ACCU-CHEK AVIVA PLUS) test strip Check blood sugar 1-2 times daily and as directed. Dx E11.8 100 each 0  . Iron-Folic Acid-Vit L89 (IRON FORMULA PO) Take 65 mg by mouth daily.    [name] lisinopril-hydrochlorothiazide (PRINZIDE,ZESTORETIC) 20-12.5 MG tablet Take 1 tablet by mouth daily. 90 tablet 2  . metFORMIN (GLUCOPHAGE) 1000 MG tablet Take 1 tablet (1,000 mg total) by mouth 2 (two) times daily with a meal. 180 tablet 2  . omeprazole (PRILOSEC) 40 MG capsule Take 1 capsule (40 mg total) by mouth daily. 90 capsule 2  . oxybutynin (DITROPAN) 5 MG tablet Take 1 tablet (5 mg total) by mouth 3 (three) times daily. 270 tablet 2  . pravastatin (PRAVACHOL) 40 MG tablet Take 1 tablet (40 mg total) by mouth daily. 90 tablet 2  . VITAMIN D, CHOLECALCIFEROL, PO Take 5,000 Units by mouth daily.     No current facility-administered medications for this visit.     Not on File  Family History  Problem Relation Age of Onset  . Lung cancer Father   . Alzheimer's disease Mother     Social History   Social History  . Marital status: Widowed    Spouse name: N/A  . Number of children: N/A  . Years of  education: N/A   Occupational History  . Not on file.   Social History Main Topics  . Smoking status: Never Smoker  . Smokeless tobacco: Not on file  . Alcohol use 0.0 oz/week     Comment: rarely  . Drug use: No  . Sexual activity: Not on file   Other Topics Concern  . Not on file   Social History Narrative   Widow.   2 children, numerous grandchildren.   Retired. Once worked on a Publishing copy.   Enjoys playing computer games, spending time with family.    Hospitiliaztions: None  Health Maintenance:    Flu: Due today.  Tetanus: Completed in 2016  Pneumovax: Completed in 2004 and 2011  Prevnar: Completed in 2016  Zostavax: Never completed  Bone Density: Never completed   Colonoscopy: Completed in 2012  Eye Doctor: Completed 2 years ago  Dental Exam: False teeth  Mammogram: Completed in 2016, due.  Pap: Completed years ago, declines      Providers: [name], PCP   I have personally reviewed and have noted: 1. The patient's medical and social history 2. Their use of alcohol, tobacco or illicit drugs 3. Their current medications and supplements 4. The patient's functional ability including ADL's, fall risks, home safety risks  and hearing or visual impairment. 5. Diet and physical activities 6. Evidence for depression or mood disorder  Subjective:   Review of Systems:   Constitutional: Denies fever, malaise, fatigue, headache or abrupt weight changes.  HEENT: Denies eye pain, eye redness, ear pain, ringing in the ears, wax buildup, runny nose, nasal congestion, bloody nose, or sore throat. Respiratory: Denies difficulty breathing, shortness of breath, cough or sputum production.   Cardiovascular: Denies chest pain, chest tightness, palpitations or swelling in the hands or feet.  Gastrointestinal: Denies abdominal pain, bloating, constipation, diarrhea or blood in the stool.  GU: Denies urgency, blood in urine, odor or discharge. She does take Oxybutynin  for incontinence. Over the past week she's noticed dysuria. She denies hematuria, foul smelling urine, increased frequency. Musculoskeletal: Denies muscle pain or joint pain and swelling. Uses cane for ambulation. Chronic knee pain. Chronic decrease in ROM to fingers of right hand. History of osteoarthritis. Skin: Denies redness, rashes, lesions or ulcercations.  Neurological: Denies dizziness, difficulty with speech or problems with balance and coordination. Does experience some difficulty with short term memory.  No other specific complaints in a complete review of systems (except as listed in HPI above).  Objective:  PE:   BP 130/70   Pulse 60   Temp 97.7 F (36.5 C) (Oral)   Ht 5\' 6"  (1.676 m)   Wt 244 lb (110.7 kg)   SpO2 96%   BMI 39.38 kg/m  Wt Readings from Last 3 Encounters:  07/24/16 244 lb (110.7 kg)  02/06/16 244 lb 1.9 oz (110.7 kg)    General: Appears their stated age, well developed, well nourished in NAD. Skin: Warm, dry and intact. No rashes, lesions or ulcerations noted. HEENT: Head: normal shape and size; Eyes: sclera white, no icterus, conjunctiva pink, PERRLA and EOMs intact; Ears: Tm's gray and intact, normal light reflex; Nose: mucosa pink and moist, septum midline; Throat/Mouth: Teeth present, mucosa pink and moist, no exudate, lesions or ulcerations noted.  Neck: Normal range of motion. Neck supple, trachea midline. No massses, lumps or thyromegaly present.  Cardiovascular: Normal rate and rhythm. S1,S2 noted.  No murmur, rubs or gallops noted. No JVD or BLE edema. No carotid bruits noted. Pulmonary/Chest: Normal effort and positive vesicular breath sounds. No respiratory distress. No wheezes, rales or ronchi noted.  Abdomen: Soft and nontender. Normal bowel sounds, no bruits noted. No distention or masses noted. Liver, spleen and kidneys non palpable. Musculoskeletal: Normal range of motion. No signs of joint swelling. No difficulty with gait.  Neurological:  Alert and oriented. Cranial nerves II-XII intact. Coordination normal. +DTRs bilaterally. Psychiatric: Mood and affect normal. Behavior is normal. Judgment and thought content normal.     BMET    Component Value Date/Time   NA 134 (L) 06/01/2009 0552   K 3.8 06/01/2009 0552   CL 97 06/01/2009 0552   CO2 26 06/01/2009 0552   GLUCOSE 191 (H) 06/01/2009 0552   BUN 14 06/01/2009 0552   CREATININE 0.84 06/01/2009 0552   CALCIUM 8.8 06/01/2009 0552   GFRNONAA >60 06/01/2009 0552   GFRAA  06/01/2009 0552    >60        The eGFR has been calculated using the MDRD equation. This calculation has not been validated in all clinical situations. eGFR's persistently <60 mL/min signify possible Chronic Kidney Disease.    Lipid Panel  No results found for: CHOL, TRIG, HDL, CHOLHDL, VLDL, LDLCALC  CBC    Component Value Date/Time   WBC 9.1 06/01/2009  0552   RBC 3.98 06/01/2009 0552   HGB 12.1 06/01/2009 0552   HCT 34.9 (L) 06/01/2009 0552   PLT 153 06/01/2009 0552   MCV 87.6 06/01/2009 0552   MCHC 34.7 06/01/2009 0552   RDW 13.2 06/01/2009 0552   LYMPHSABS 1.4 06/01/2009 0552   MONOABS 0.5 06/01/2009 0552   EOSABS 0.2 06/01/2009 0552   BASOSABS 0.0 06/01/2009 0552    Hgb A1C No results found for: HGBA1C    Assessment and Plan:   Medicare Annual Wellness Visit:  Diet: She endorses a heathy diet. Breakfast: Cereal, eggs, sometimes skips Lunch: Sandwich, pasta salad, fruit Dinner: Restaurants, left overs, sandwich, canned food Snacks: Sugar free ice cream, sugar free candy Desserts: Daily Beverages: Diet soda, water with flavor Physical activity: Sedentary Depression/mood screen: Negative Hearing: Intact to whispered voice Visual acuity: Grossly normal, performs annual eye exam  ADLs: Capable Fall risk: Yes, uses cane Home safety: Good Cognitive evaluation: Intact to orientation, naming, recall and repetition EOL planning: Adv directives completed., [name] to be  [place], daughter.  Preventative Medicine: Immunizations UTD except for influenza (administered today) and Zostavax (too expensive in the past). Rx printed for Zostavax for her to inquire price at her pharmacy. Mammogram and Bone Density testing due, ordered and pending. Colonoscopy UTD. Declines Pap given age. Due for annual eye exam, patient will schedule. Discussed importance of diabetic diet and regular exercise. Exam unremarkable, labs pending. Recommendations provided at end of visit.  Next appointment: Depending on lab results. Likely 6 months.

## 2016-07-24 NOTE — Assessment & Plan Note (Signed)
Stable on Prozac, continue same.

## 2016-07-24 NOTE — Assessment & Plan Note (Signed)
Overall stable on Oxybutynin, continue same.  Complaints of dysuria x 1 week. UA today: 3+ leuks, 2+ blood, negative nitrites. Culture pending. Rx for Cephalexin course sent to pharmacy. Push intake of water for now.

## 2016-07-24 NOTE — Progress Notes (Signed)
Pre visit review using our clinic review tool, if applicable. No additional management support is needed unless otherwise documented below in the visit note. 

## 2016-07-24 NOTE — Assessment & Plan Note (Signed)
Stable in the clinic today. Continue lisinopril-hctz and carvedilol.

## 2016-07-26 ENCOUNTER — Encounter: Payer: Self-pay | Admitting: *Deleted

## 2016-07-26 LAB — URINE CULTURE

## 2016-08-07 ENCOUNTER — Other Ambulatory Visit: Payer: Self-pay | Admitting: Primary Care

## 2016-08-15 DIAGNOSIS — G4730 Sleep apnea, unspecified: Secondary | ICD-10-CM | POA: Diagnosis not present

## 2016-08-28 ENCOUNTER — Telehealth: Payer: Self-pay | Admitting: *Deleted

## 2016-08-28 DIAGNOSIS — R809 Proteinuria, unspecified: Principal | ICD-10-CM

## 2016-08-28 DIAGNOSIS — E1129 Type 2 diabetes mellitus with other diabetic kidney complication: Secondary | ICD-10-CM

## 2016-08-28 NOTE — Telephone Encounter (Addendum)
Patient left a voicemail stating that you stopped her metformin and her blood sugar is staying above 150. Patient stated that she also has a rash in the bend of her arms and until her blood sugar is under control she will continue to have the rash. Patient stated that she is using an old ointment that another doctor gave her, but wants to know if you can prescribe something for the rash?  Patient wants to know if she should start back on the metformin or something else for her sugar? Pharmacy [facility]

## 2016-08-28 NOTE — Telephone Encounter (Signed)
She cannot resume her Metformin as her kidneys cannot handle this medication. Is she taking Glimepiride 4 mg once daily? Is she eating a healthy diet? What are her blood sugars running? As for the rash, I need to see it in the office so I can understand what kind of rash.

## 2016-08-28 NOTE — Telephone Encounter (Signed)
Spoken and notified patient of Kate's comments. Patient verbalized understanding.  Patient stated that she is taking the glimepiride 4 mg.  Patient is trying to eat a healthy diet. She eat very little meat. Trying to eat more fruits and vegetables.  Patient usually check her twice and one after the other to make sure that the numbers are correct. She checks it before she eats. Blood sugars 150s to 200s.  Patient will call to make an appointment sometime by the end of the week.

## 2016-08-29 MED ORDER — GLIMEPIRIDE 4 MG PO TABS: 4.0000 mg | ORAL_TABLET | Freq: Two times a day (BID) | ORAL | 0 refills | Status: DC

## 2016-08-29 NOTE — Telephone Encounter (Signed)
Spoken and notified patient of Kate's comments. Patient verbalized understanding. 

## 2016-08-29 NOTE — Telephone Encounter (Signed)
Please have patient start taking Glimepiride 4 mg twice daily with meals. Have her continue to monitor blood sugars. I sent a new Rx for Glimepiride to her pharmacy. Needs repeat A1C after January 24th, please ensure she is scheduled for a lab only appointment.

## 2016-08-30 ENCOUNTER — Encounter: Payer: Self-pay | Admitting: Primary Care

## 2016-08-30 ENCOUNTER — Ambulatory Visit: Payer: Medicare Other | Admitting: Primary Care

## 2016-08-30 ENCOUNTER — Ambulatory Visit (INDEPENDENT_AMBULATORY_CARE_PROVIDER_SITE_OTHER): Payer: Medicare Other | Admitting: Primary Care

## 2016-08-30 VITALS — BP 136/72 | HR 61 | Temp 98.1°F | Ht 66.0 in | Wt 261.4 lb

## 2016-08-30 DIAGNOSIS — L309 Dermatitis, unspecified: Secondary | ICD-10-CM

## 2016-08-30 DIAGNOSIS — E118 Type 2 diabetes mellitus with unspecified complications: Secondary | ICD-10-CM | POA: Diagnosis not present

## 2016-08-30 MED ORDER — TRIAMCINOLONE ACETONIDE 0.1 % EX CREA: 1.0000 "application " | TOPICAL_CREAM | Freq: Two times a day (BID) | CUTANEOUS | 0 refills | Status: DC

## 2016-08-30 MED ORDER — ACCU-CHEK AVIVA PLUS W/DEVICE KIT: PACK | 0 refills | Status: DC

## 2016-08-30 NOTE — Assessment & Plan Note (Addendum)
Hyperglycemia since removal of metformin. We will increase glimepiride to 4 mg twice daily. Continue improvements in diet and start exercising. Repeat A1c due in late January 2018.

## 2016-08-30 NOTE — Progress Notes (Signed)
Pre visit review using our clinic review tool, if applicable. No additional management support is needed unless otherwise documented below in the visit note. 

## 2016-08-30 NOTE — Patient Instructions (Addendum)
Increase your Glimepiride to 4 mg twice daily.  We sent a prescription for the new glucose meter to your pharmacy.  Start Triamcinolone 1% cream for rash. Apply to affected area twice daily.  Schedule a lab only appointment after January 24th for recheck of your labs.  It was a pleasure to see you today!

## 2016-08-30 NOTE — Progress Notes (Signed)
Subjective:    Patient ID: Megan Obrien, female    DOB: 1946/09/05, 70 y.o.   MRN: [id number]  HPI  Megan Obrien is a 70 year old female who presents today with a chief complaint of rash and hyperglycemia.  She has a history of type 2 diabetes and is currently managed on Glimpeiride 4 mg once daily. She was managed on Metformin but this was removed given recent elevation in creatinine and GFR of 41. She called several days ago reporting elevations in blood sugar levels since removing Metformin. She's checking her blood sugars fasting which are running 140-170's fasting.   She has a history of intermittent itchy rash that will flare with episodes of hyperglycemia that is typically located to her upper extremity antecubital fossa bilaterally. Her recent rash began intermittently since she stopped taking Metformin. She's been applying an old prescription strength cream that was provided by her PCP several years ago. This has not helped with the rash as it once did.  She denies numbness, tingling, shortness of breath, chest pain.  Review of Systems  Respiratory: Negative for shortness of breath.   Cardiovascular: Negative for chest pain.  Skin: Positive for rash. Negative for wound.  Neurological: Negative for dizziness and headaches.       Past Medical History:  Diagnosis Date  . Arthritis   . Depression   . GERD (gastroesophageal reflux disease)   . Hyperlipidemia   . Murmur   . Seasonal allergies   . Type 2 diabetes mellitus ([place])   . Urinary incontinence      Social History   Social History  . Marital status: Widowed    Spouse name: N/A  . Number of children: N/A  . Years of education: N/A   Occupational History  . Not on file.   Social History Main Topics  . Smoking status: Never Smoker  . Smokeless tobacco: Not on file  . Alcohol use 0.0 oz/week     Comment: rarely  . Drug use: No  . Sexual activity: Not on file   Other Topics Concern  . Not on file    Social History Narrative   Widow.   2 children, numerous grandchildren.   Retired. Once worked on a Publishing copy.   Enjoys playing computer games, spending time with family.    Past Surgical History:  Procedure Laterality Date  . BACK SURGERY    . KNEE SURGERY Left   . TONSILLECTOMY AND ADENOIDECTOMY  1951    Family History  Problem Relation Age of Onset  . Lung cancer Father   . Alzheimer's disease Mother     No Known Allergies  Current Outpatient Prescriptions on File Prior to Visit  Medication Sig Dispense Refill  . ACCU-CHEK AVIVA PLUS test strip USE TO TEST BLOOD SUGAR ONE TO TWO TIMES DAILY AND AS DIRECTED 100 each 5  . carvedilol (COREG) 6.25 MG tablet Take 2 tablets (12.5 mg total) by mouth 2 (two) times daily with a meal. 360 tablet 2  . FLUoxetine (PROZAC) 40 MG capsule Take 1 capsule (40 mg total) by mouth daily. 90 capsule 2  . glimepiride (AMARYL) 4 MG tablet Take 1 tablet (4 mg total) by mouth 2 (two) times daily with a meal. 180 tablet 0  . Iron-Folic Acid-Vit A35 (IRON FORMULA PO) Take 65 mg by mouth daily.    [name] lisinopril-hydrochlorothiazide (PRINZIDE,ZESTORETIC) 20-12.5 MG tablet Take 1 tablet by mouth daily. 90 tablet 2  . omeprazole (PRILOSEC) 40 MG capsule  Take 1 capsule (40 mg total) by mouth daily. 90 capsule 2  . oxybutynin (DITROPAN) 5 MG tablet Take 1 tablet (5 mg total) by mouth 3 (three) times daily. 270 tablet 2  . pravastatin (PRAVACHOL) 40 MG tablet Take 1 tablet (40 mg total) by mouth daily. 90 tablet 2  . VITAMIN D, CHOLECALCIFEROL, PO Take 5,000 Units by mouth daily.     No current facility-administered medications on file prior to visit.     BP 136/72   Pulse 61   Temp 98.1 F (36.7 C) (Oral)   Ht 5\' 6"  (1.676 m)   Wt 261 lb 6.4 oz (118.6 kg)   SpO2 96%   BMI 42.19 kg/m    Objective:   Physical Exam  Constitutional: She appears well-nourished.  Neck: Neck supple.  Cardiovascular: Normal rate and regular rhythm.    Pulmonary/Chest: Effort normal and breath sounds normal.  Skin: Skin is warm and dry. Rash noted.  Mild rash to bilateral antecubital fossa representative of eczema.          Assessment & Plan:  Rash:  Intermittently present to antecubital fossa for years. Recent episode over the past month. Exam today with evidence of eczema-like rash. Suspect this has flared due to hyperglycemia and stress. Prescription for triamcinolone 1% cream sent to pharmacy for twice a day application. She will notify no improvement.  Sheral Flow, NP

## 2016-09-10 ENCOUNTER — Ambulatory Visit
Admission: RE | Admit: 2016-09-10 | Discharge: 2016-09-10 | Disposition: A | Discharge status: Home / Self Care | Payer: Medicare Other | Source: Ambulatory Visit | Attending: Primary Care | Admitting: Primary Care

## 2016-09-10 ENCOUNTER — Other Ambulatory Visit: Payer: Self-pay | Admitting: Family Medicine

## 2016-09-10 DIAGNOSIS — M85852 Other specified disorders of bone density and structure, left thigh: Secondary | ICD-10-CM | POA: Insufficient documentation

## 2016-09-10 DIAGNOSIS — Z1239 Encounter for other screening for malignant neoplasm of breast: Secondary | ICD-10-CM

## 2016-09-10 DIAGNOSIS — Z13820 Encounter for screening for osteoporosis: Secondary | ICD-10-CM | POA: Diagnosis not present

## 2016-09-10 DIAGNOSIS — E2839 Other primary ovarian failure: Secondary | ICD-10-CM | POA: Insufficient documentation

## 2016-09-11 ENCOUNTER — Encounter: Payer: Self-pay | Admitting: Family Medicine

## 2016-09-11 ENCOUNTER — Encounter: Payer: Self-pay | Admitting: *Deleted

## 2016-09-11 ENCOUNTER — Ambulatory Visit (INDEPENDENT_AMBULATORY_CARE_PROVIDER_SITE_OTHER): Payer: Medicare Other | Admitting: Family Medicine

## 2016-09-11 ENCOUNTER — Ambulatory Visit (INDEPENDENT_AMBULATORY_CARE_PROVIDER_SITE_OTHER): Payer: Medicare Other

## 2016-09-11 VITALS — BP 160/69 | HR 89 | Temp 97.8°F | Wt 264.0 lb

## 2016-09-11 DIAGNOSIS — M79672 Pain in left foot: Secondary | ICD-10-CM

## 2016-09-11 DIAGNOSIS — M19072 Primary osteoarthritis, left ankle and foot: Secondary | ICD-10-CM | POA: Diagnosis not present

## 2016-09-11 DIAGNOSIS — M109 Gout, unspecified: Secondary | ICD-10-CM | POA: Diagnosis not present

## 2016-09-11 LAB — BASIC METABOLIC PANEL
BUN: 23 mg/dL (ref 6–23)
CHLORIDE: 99 meq/L (ref 96–112)
CO2: 32 mEq/L (ref 19–32)
Calcium: 9.7 mg/dL (ref 8.4–10.5)
Creatinine, Ser: 1.08 mg/dL (ref 0.40–1.20)
GFR: 53.04 mL/min — ABNORMAL LOW (ref >60.00)
GLUCOSE: 161 mg/dL — AB (ref 70–99)
POTASSIUM: 4.2 meq/L (ref 3.5–5.1)
Sodium: 138 mEq/L (ref 135–145)

## 2016-09-11 LAB — URIC ACID: Uric Acid, Serum: 6.7 mg/dL (ref 2.4–7.0)

## 2016-09-11 MED ORDER — NAPROXEN 500 MG PO TABS: 500.0000 mg | ORAL_TABLET | Freq: Two times a day (BID) | ORAL | 0 refills | Status: DC

## 2016-09-11 NOTE — Patient Instructions (Signed)
Nice to see you. We will start you on naproxen for your gout pain. You need to take this with food. If it causes upset stomach please let us know. We'll check some lab work today and an x-ray to look for other causes of this discomfort. If you develops spreading redness, warmth, fevers, chills, sweats, or any new or changing symptoms please seek medical attention immediately.

## 2016-09-11 NOTE — Progress Notes (Signed)
Tommi Rumps, MD Phone: [phone]  Megan Obrien is a 70 y.o. female who presents today for same-day visit.  Patient notes 1 week of left first MTP joint discomfort. Notes there was some mild swelling with it last night. Notes some overlying erythema. No spreading erythema. No fevers. No chills. No warmth to it. Notes the pain occurs if she steps on it or if she wears shoes. She feels well overall. She did eat more red meat recently. No alcohol intake. Reports she was treated for gout last year. She does have a history of diabetes and her sugars have been running around the 150s. She is unsure what she was treated with last year. Does have a history of reflux though this is well controlled at this time.  PMH: History of gout.   ROS see history of present illness  Objective  Physical Exam Vitals:   09/11/16 1326 09/11/16 1341  BP: (!) 158/88 (!) 160/69  Pulse: 89   Temp: 97.8 F (36.6 C)     BP Readings from Last 3 Encounters:  09/11/16 (!) 160/69  08/30/16 136/72  07/24/16 130/70   Wt Readings from Last 3 Encounters:  09/11/16 264 lb (119.7 kg)  08/30/16 261 lb 6.4 oz (118.6 kg)  07/24/16 244 lb (110.7 kg)    Physical Exam  Constitutional: No distress.  Cardiovascular: Normal rate, regular rhythm and normal heart sounds.   Pulmonary/Chest: Effort normal.  Musculoskeletal: She exhibits no edema.  Left first MTP joint with overlying erythema and proximal to the MTP joint to the midfoot over the medial aspect and just distal into the proximal great toe not past the interphalangeal joint, this area is tender, there is no induration or warmth, no fluctuance, good capillary refill in the great toe, sensation to light touch intact in left foot, no other erythema in the foot, right foot with no swelling, warmth, erythema, or tenderness  Neurological: She is alert.  Skin: Skin is warm and dry. She is not diaphoretic.     Assessment/Plan: Please see individual problem  list.  [place] History and exam most consistent with gout. No documented gout in her chart though reports she was treated for gout last year at her former doctor's office. Vital signs are stable and she is systemically well arguing against infection. There is no warmth or induration to indicate cellulitis. History most consistent with gout. Given her diabetes history we will obtain a CBC and sedimentation rate to evaluate for other underlying causes. Also obtain uric acid. Given tenderness we will additionally obtain a left foot x-ray. Considered using colchicine though it appears there is an interaction between that and her pravastatin which places her at risk for myopathy. Patient with no active GI issues thus we will proceed with naproxen for treatment. We will check her kidney function to ensure that she can tolerate this as well. Could consider prednisone if unable to tolerate NSAIDs. Patient is given return precautions.   Orders Placed This Encounter  Procedures  . DG Foot Complete Left    Standing Status:   Future    Number of Occurrences:   1    Standing Expiration Date:   11/12/2017    Order Specific Question:   Reason for Exam (SYMPTOM  OR DIAGNOSIS REQUIRED)    Answer:   left foot discomfort at first MTP with overlying erythema and tenderness    Order Specific Question:   Preferred imaging location?    Answer:   [facility]  .  CBC  . Sed Rate (ESR)  . Uric acid  . Basic Metabolic Panel (BMET)    Meds ordered this encounter  Medications  . naproxen (NAPROSYN) 500 MG tablet    Sig: Take 1 tablet (500 mg total) by mouth 2 (two) times daily with a meal.    Dispense:  10 tablet    Refill:  0    Tommi Rumps, MD [place]

## 2016-09-11 NOTE — Assessment & Plan Note (Signed)
History and exam most consistent with gout. No documented gout in her chart though reports she was treated for gout last year at her former doctor's office. Vital signs are stable and she is systemically well arguing against infection. There is no warmth or induration to indicate cellulitis. History most consistent with gout. Given her diabetes history we will obtain a CBC and sedimentation rate to evaluate for other underlying causes. Also obtain uric acid. Given tenderness we will additionally obtain a left foot x-ray. Considered using colchicine though it appears there is an interaction between that and her pravastatin which places her at risk for myopathy. Patient with no active GI issues thus we will proceed with naproxen for treatment. We will check her kidney function to ensure that she can tolerate this as well. Could consider prednisone if unable to tolerate NSAIDs. Patient is given return precautions.

## 2016-09-11 NOTE — Progress Notes (Signed)
Pre visit review using our clinic review tool, if applicable. No additional management support is needed unless otherwise documented below in the visit note. 

## 2016-09-12 ENCOUNTER — Ambulatory Visit: Payer: Medicare Other | Admitting: Primary Care

## 2016-09-12 LAB — CBC
HEMATOCRIT: 38.4 % (ref 36.0–46.0)
HEMOGLOBIN: 13 g/dL (ref 12.0–15.0)
MCHC: 34 g/dL (ref 30.0–36.0)
MCV: 90.8 fl (ref 78.0–100.0)
Platelets: 168 10*3/uL (ref 150.0–400.0)
RBC: 4.23 Mil/uL (ref 3.87–5.11)
RDW: 12.9 % (ref 11.5–15.5)
WBC: 8.9 10*3/uL (ref 4.0–10.5)

## 2016-09-12 LAB — SEDIMENTATION RATE: Sed Rate: 35 mm/hr — ABNORMAL HIGH (ref 0–30)

## 2016-09-14 DIAGNOSIS — G4730 Sleep apnea, unspecified: Secondary | ICD-10-CM | POA: Diagnosis not present

## 2016-09-18 ENCOUNTER — Other Ambulatory Visit: Payer: Self-pay | Admitting: Family Medicine

## 2016-09-18 ENCOUNTER — Other Ambulatory Visit: Payer: Self-pay | Admitting: Primary Care

## 2016-09-18 NOTE — Addendum Note (Signed)
Addended by: Jacqualin Combes on: 09/18/2016 03:13 PM   Modules accepted: Orders

## 2016-09-19 ENCOUNTER — Other Ambulatory Visit: Payer: Self-pay | Admitting: Family Medicine

## 2016-09-20 ENCOUNTER — Telehealth: Payer: Self-pay | Admitting: Primary Care

## 2016-09-20 NOTE — Telephone Encounter (Signed)
Pt lvm stating that she saw dr. Caryl Bis for the gout on her foot. States that she has finished the naproxen and her foot is still swollen and tender. States that the pharmacy will not let her refill her naproxen. Pt would like a cb to discuss. Pt cb [phone]

## 2016-09-20 NOTE — Telephone Encounter (Signed)
Advised patient to contact pcp office for any further refills

## 2016-09-28 ENCOUNTER — Ambulatory Visit (INDEPENDENT_AMBULATORY_CARE_PROVIDER_SITE_OTHER): Payer: Medicare Other | Admitting: Family Medicine

## 2016-09-28 ENCOUNTER — Encounter: Payer: Self-pay | Admitting: Family Medicine

## 2016-09-28 VITALS — BP 124/88 | HR 79 | Wt 270.0 lb

## 2016-09-28 DIAGNOSIS — M79672 Pain in left foot: Secondary | ICD-10-CM

## 2016-09-28 DIAGNOSIS — E118 Type 2 diabetes mellitus with unspecified complications: Secondary | ICD-10-CM | POA: Diagnosis not present

## 2016-09-28 DIAGNOSIS — B351 Tinea unguium: Secondary | ICD-10-CM | POA: Diagnosis not present

## 2016-09-28 MED ORDER — NAPROXEN 500 MG PO TABS: 500.0000 mg | ORAL_TABLET | Freq: Two times a day (BID) | ORAL | 0 refills | Status: DC | PRN

## 2016-09-28 NOTE — Progress Notes (Signed)
Subjective:    Patient ID: Megan Obrien, female    DOB: July 09, 1946, 70 y.o.   MRN: [id number]  HPI This is a 70 yo female, brought in by her daughter, who presents today with left foot pain. Was seen 09/11/16 by Dr. Caryl Bis and was diagnosed with gout. Had some improvement with naproxen, 5 day course and then pain has increased once she stopped naproxen.Pain is under and on top of foot near big toe, worse with putting on shoes and with weight of blankets, not as much with walking. Some redness at area and bilateral foot swelling. Uric acid was normal, ESR was mildly elevated at 35. BMP showed continued decreased GFR (though improved) of 53. She has DM type 2 and last HgbA1C was 8.2. She has had similar symptoms and diagnosis in the past, no known triggers. Longstanding thickened toenails and dry feet. Does not drink water, drinks diet, decaffeinated soda.   Past Medical History:  Diagnosis Date  . Arthritis   . Depression   . GERD (gastroesophageal reflux disease)   . Hyperlipidemia   . Murmur   . Seasonal allergies   . Type 2 diabetes mellitus ([place])   . Urinary incontinence    Past Surgical History:  Procedure Laterality Date  . BACK SURGERY    . KNEE SURGERY Left   . TONSILLECTOMY AND ADENOIDECTOMY  1951   Family History  Problem Relation Age of Onset  . Lung cancer Father   . Alzheimer's disease Mother    Social History  Substance Use Topics  . Smoking status: Never Smoker  . Smokeless tobacco: Not on file  . Alcohol use 0.0 oz/week     Comment: rarely      Review of Systems Per HPI    Objective:   Physical Exam  Constitutional: She is oriented to person, place, and time. She appears well-developed and well-nourished. No distress.  Morbidly obese.   HENT:  Head: Normocephalic and atraumatic.  Eyes: Conjunctivae are normal.  Cardiovascular: Normal rate.   Pulmonary/Chest: Effort normal.  Musculoskeletal:       Left foot: There is tenderness.  Left MTP  with mild erythema, mild tenderness, bilateral feet with trace edema.  Thickened toenails bilaterally, some superficial, non erythematous cracking along toes and medial soles, scattered area of peeling.   Neurological: She is alert and oriented to person, place, and time.  Skin: She is not diaphoretic.  Psychiatric: She has a normal mood and affect. Her behavior is normal. Judgment and thought content normal.  Vitals reviewed.     BP 124/88   Pulse 79   Wt 270 lb (122.5 kg)   SpO2 96%   BMI 43.58 kg/m  Wt Readings from Last 3 Encounters:  09/28/16 270 lb (122.5 kg)  09/11/16 264 lb (119.7 kg)  08/30/16 261 lb 6.4 oz (118.6 kg)       Assessment & Plan:  1. Left foot pain - discussed diet to minimize gout and provided written information - discussed using naproxen sparingly - naproxen (NAPROSYN) 500 MG tablet; Take 1 tablet (500 mg total) by mouth 2 (two) times daily as needed.  Dispense: 20 tablet; Refill: 0 - Ambulatory referral to Podiatry  2. Onychomycosis - Ambulatory referral to Podiatry  3. Type 2 diabetes mellitus with complication, without long-term current use of insulin ([place]) - discussed need for diabetic foot exam with her current symptoms - Ambulatory referral to Podiatry  - follow up with PCP in 4 weeks for repeat labs  Clarene Reamer, FNP-BC  [facility]  09/28/2016 5:02 PM

## 2016-09-28 NOTE — Patient Instructions (Addendum)
Use naproxen as sparingly as possible Drink more water!!! Gout Gout is painful swelling that can occur in some of your joints. Gout is a type of arthritis. This condition is caused by having too much uric acid in your body. Uric acid is a chemical that forms when your body breaks down substances called purines. Purines are important for building body proteins. When your body has too much uric acid, sharp crystals can form and build up inside your joints. This causes pain and swelling. Gout attacks can happen quickly and be very painful (acute gout). Over time, the attacks can affect more joints and become more frequent (chronic gout). Gout can also cause uric acid to build up under your skin and inside your kidneys. What are the causes? This condition is caused by too much uric acid in your blood. This can occur because:  Your kidneys do not remove enough uric acid from your blood. This is the most common cause.  Your body makes too much uric acid. This can occur with some cancers and cancer treatments. It can also occur if your body is breaking down too many red blood cells (hemolytic anemia).  You eat too many foods that are high in purines. These foods include organ meats and some seafood. Alcohol, especially beer, is also high in purines. A gout attack may be triggered by trauma or stress. What increases the risk? This condition is more likely to develop in people who:  Have a family history of gout.  Are female and middle-aged.  Are female and have gone through menopause.  Are obese.  Frequently drink alcohol, especially beer.  Are dehydrated.  Lose weight too quickly.  Have an organ transplant.  Have lead poisoning.  Take certain medicines, including aspirin, cyclosporine, diuretics, levodopa, and niacin.  Have kidney disease or psoriasis. What are the signs or symptoms? An attack of acute gout happens quickly. It usually occurs in just one joint. The most common place is  the big toe. Attacks often start at night. Other joints that may be affected include joints of the feet, ankle, knee, fingers, wrist, or elbow. Symptoms may include:  Severe pain.  Warmth.  Swelling.  Stiffness.  Tenderness. The affected joint may be very painful to touch.  Shiny, red, or purple skin.  Chills and fever. Chronic gout may cause symptoms more frequently. More joints may be involved. You may also have white or yellow lumps (tophi) on your hands or feet or in other areas near your joints. How is this diagnosed? This condition is diagnosed based on your symptoms, medical history, and physical exam. You may have tests, such as:  Blood tests to measure uric acid levels.  Removal of joint fluid with a needle (aspiration) to look for uric acid crystals.  X-rays to look for joint damage. How is this treated? Treatment for this condition has two phases: treating an acute attack and preventing future attacks. Acute gout treatment may include medicines to reduce pain and swelling, including:  NSAIDs.  Steroids. These are strong anti-inflammatory medicines that can be taken by mouth (orally) or injected into a joint.  Colchicine. This medicine relieves pain and swelling when it is taken soon after an attack. It can be given orally or through an IV tube. Preventive treatment may include:  Daily use of smaller doses of NSAIDs or colchicine.  Use of a medicine that reduces uric acid levels in your blood.  Changes to your diet. You may need to see a specialist  about healthy eating (dietitian). Follow these instructions at home: During a Gout Attack  If directed, apply ice to the affected area:  Put ice in a plastic bag.  Place a towel between your skin and the bag.  Leave the ice on for 20 minutes, 2-3 times a day.  Rest the joint as much as possible. If the affected joint is in your leg, you may be given crutches to use.  Raise (elevate) the affected joint above the  level of your heart as often as possible.  Drink enough fluids to keep your urine clear or pale yellow.  Take over-the-counter and prescription medicines only as told by your health care provider.  Do not drive or operate heavy machinery while taking prescription pain medicine.  Follow instructions from your health care provider about eating or drinking restrictions.  Return to your normal activities as told by your health care provider. Ask your health care provider what activities are safe for you. Avoiding Future Gout Attacks  Follow a low-purine diet as told by your dietitian or health care provider. Avoid foods and drinks that are high in purines, including liver, kidney, anchovies, asparagus, herring, mushrooms, mussels, and beer.  Limit alcohol intake to no more than 1 drink a day for nonpregnant women and 2 drinks a day for men. One drink equals 12 oz of beer, 5 oz of wine, or 1 oz of hard liquor.  Maintain a healthy weight or lose weight if you are overweight. If you want to lose weight, talk with your health care provider. It is important that you do not lose weight too quickly.  Start or maintain an exercise program as told by your health care provider.  Drink enough fluids to keep your urine clear or pale yellow.  Take over-the-counter and prescription medicines only as told by your health care provider.  Keep all follow-up visits as told by your health care provider. This is important. Contact a health care provider if:  You have another gout attack.  You continue to have symptoms of a gout attack after10 days of treatment.  You have side effects from your medicines.  You have chills or a fever.  You have burning pain when you urinate.  You have pain in your lower back or belly. Get help right away if:  You have severe or uncontrolled pain.  You cannot urinate. This information is not intended to replace advice given to you by your health care provider. Make  sure you discuss any questions you have with your health care provider. Document Released: 09/14/2000 Document Revised: 02/23/2016 Document Reviewed: 06/30/2015 Elsevier Interactive Patient Education  2017 [employer].

## 2016-10-10 ENCOUNTER — Emergency Department
Admission: EM | Admit: 2016-10-10 | Discharge: 2016-10-10 | Disposition: A | Discharge status: Home / Self Care | Payer: Medicare Other | Attending: Emergency Medicine | Admitting: Emergency Medicine

## 2016-10-10 ENCOUNTER — Encounter: Payer: Self-pay | Admitting: Emergency Medicine

## 2016-10-10 ENCOUNTER — Emergency Department: Payer: Medicare Other

## 2016-10-10 DIAGNOSIS — S0083XA Contusion of other part of head, initial encounter: Secondary | ICD-10-CM | POA: Insufficient documentation

## 2016-10-10 DIAGNOSIS — W19XXXA Unspecified fall, initial encounter: Secondary | ICD-10-CM

## 2016-10-10 DIAGNOSIS — Y999 Unspecified external cause status: Secondary | ICD-10-CM | POA: Insufficient documentation

## 2016-10-10 DIAGNOSIS — Z7984 Long term (current) use of oral hypoglycemic drugs: Secondary | ICD-10-CM | POA: Insufficient documentation

## 2016-10-10 DIAGNOSIS — W0110XA Fall on same level from slipping, tripping and stumbling with subsequent striking against unspecified object, initial encounter: Secondary | ICD-10-CM | POA: Diagnosis not present

## 2016-10-10 DIAGNOSIS — R55 Syncope and collapse: Secondary | ICD-10-CM | POA: Insufficient documentation

## 2016-10-10 DIAGNOSIS — S0990XA Unspecified injury of head, initial encounter: Secondary | ICD-10-CM | POA: Diagnosis not present

## 2016-10-10 DIAGNOSIS — Y929 Unspecified place or not applicable: Secondary | ICD-10-CM | POA: Diagnosis not present

## 2016-10-10 DIAGNOSIS — Y939 Activity, unspecified: Secondary | ICD-10-CM | POA: Insufficient documentation

## 2016-10-10 DIAGNOSIS — E119 Type 2 diabetes mellitus without complications: Secondary | ICD-10-CM | POA: Insufficient documentation

## 2016-10-10 DIAGNOSIS — S0003XA Contusion of scalp, initial encounter: Secondary | ICD-10-CM | POA: Diagnosis not present

## 2016-10-10 DIAGNOSIS — I10 Essential (primary) hypertension: Secondary | ICD-10-CM | POA: Diagnosis not present

## 2016-10-10 DIAGNOSIS — S0093XA Contusion of unspecified part of head, initial encounter: Secondary | ICD-10-CM

## 2016-10-10 DIAGNOSIS — R0602 Shortness of breath: Secondary | ICD-10-CM | POA: Diagnosis not present

## 2016-10-10 LAB — CBC WITH DIFFERENTIAL/PLATELET
BASOS ABS: 0 10*3/uL (ref 0–0.1)
Basophils Relative: 0 %
Eosinophils Absolute: 0.4 10*3/uL (ref 0–0.7)
Eosinophils Relative: 3 %
HEMATOCRIT: 36.4 % (ref 35.0–47.0)
HEMOGLOBIN: 12.6 g/dL (ref 12.0–16.0)
LYMPHS PCT: 6 %
Lymphs Abs: 0.7 10*3/uL — ABNORMAL LOW (ref 1.0–3.6)
MCH: 31.2 pg (ref 26.0–34.0)
MCHC: 34.5 g/dL (ref 32.0–36.0)
MCV: 90.4 fL (ref 80.0–100.0)
Monocytes Absolute: 0.5 10*3/uL (ref 0.2–0.9)
Monocytes Relative: 4 %
NEUTROS ABS: 11.4 10*3/uL — AB (ref 1.4–6.5)
Neutrophils Relative %: 87 %
Platelets: 171 10*3/uL (ref 150–440)
RBC: 4.03 MIL/uL (ref 3.80–5.20)
RDW: 13.1 % (ref 11.5–14.5)
WBC: 13 10*3/uL — AB (ref 3.6–11.0)

## 2016-10-10 MED ORDER — BACITRACIN ZINC 500 UNIT/GM EX OINT
TOPICAL_OINTMENT | Freq: Once | CUTANEOUS | Status: AC
  Administered 2016-10-10: 1 via TOPICAL

## 2016-10-10 MED ORDER — ONDANSETRON HCL 4 MG/2ML IJ SOLN
4.0000 mg | Freq: Once | INTRAMUSCULAR | Status: AC
  Administered 2016-10-10: 4 mg via INTRAVENOUS
  Filled 2016-10-10: qty 2

## 2016-10-10 MED ORDER — ONDANSETRON HCL 4 MG PO TABS: 4.0000 mg | ORAL_TABLET | Freq: Three times a day (TID) | ORAL | 0 refills | Status: DC | PRN

## 2016-10-10 MED ORDER — MORPHINE SULFATE (PF) 4 MG/ML IV SOLN
4.0000 mg | Freq: Once | INTRAVENOUS | Status: AC
  Administered 2016-10-10: 4 mg via INTRAVENOUS
  Filled 2016-10-10: qty 1

## 2016-10-10 MED ORDER — BACITRACIN ZINC 500 UNIT/GM EX OINT
TOPICAL_OINTMENT | CUTANEOUS | Status: AC
  Administered 2016-10-10: 1 via TOPICAL
  Filled 2016-10-10: qty 0.9

## 2016-10-10 NOTE — ED Notes (Signed)

## 2016-10-10 NOTE — ED Notes (Signed)
Pt placed on 3L via n/c per pt and family request. Pt states she uses 2.5-3L to sleep at night.

## 2016-10-10 NOTE — ED Provider Notes (Addendum)
[facility] Emergency Department Provider Note  ____________________________________________   I have reviewed the triage vital signs and the nursing notes.   HISTORY  Chief Complaint Fall    HPI Megan Obrien is a 71 y.o. female who presents today complaining ofa mechanical fall. She was trying to get something off of her shirt, failed to which where she was going and hit the ground. She does not have any neck pain she did not pass out. She was able to rollover immediately and get up with assistance. She denies any other injury. She has a very large hematoma to the right forehead. Patient states she did take her blood pressure medications today. She is somewhat anxious and upset. She insists that she had no antecedent symptoms. She has had no chest pain or shortness of breath she does not take any blood thinners. She does not have any change in vision in that eye although it is starting to swell over.  The patient has significant high blood pressure at baseline, she states she did take her blood pressure medications today.  Past Medical History:  Diagnosis Date  . Arthritis   . Depression   . GERD (gastroesophageal reflux disease)   . Hyperlipidemia   . Murmur   . Seasonal allergies   . Type 2 diabetes mellitus ([place])   . Urinary incontinence     Patient Active Problem List   Diagnosis Date Noted  . 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 Medicare annual wellness visit, subsequent 07/24/2016  . Hyperlipidemia 02/06/2016  . Essential hypertension 02/06/2016  . Type 2 diabetes mellitus with complication, without long-term current use of insulin ([place]) 02/06/2016  . Depression 02/06/2016  . Esophageal reflux 02/06/2016  . Urge incontinence of urine 02/06/2016    Past Surgical History:  Procedure Laterality Date  . BACK SURGERY    . KNEE SURGERY Left   . TONSILLECTOMY AND ADENOIDECTOMY  1951    Prior to Admission medications   Medication Sig Start Date End  Date Taking? Authorizing Provider  ACCU-CHEK AVIVA PLUS test strip USE TO TEST BLOOD SUGAR ONE TO TWO TIMES DAILY AND AS DIRECTED 09/18/16   Pleas Koch, NP  Blood Glucose Monitoring Suppl (ACCU-CHEK AVIVA PLUS) w/Device KIT Use as directed to check blood sugar 2 times daily. 08/30/16   Pleas Koch, NP  carvedilol (COREG) 6.25 MG tablet Take 2 tablets (12.5 mg total) by mouth 2 (two) times daily with a meal. 02/06/16   Pleas Koch, NP  FLUoxetine (PROZAC) 40 MG capsule Take 1 capsule (40 mg total) by mouth daily. 02/06/16   Pleas Koch, NP  glimepiride (AMARYL) 4 MG tablet Take 1 tablet (4 mg total) by mouth 2 (two) times daily with a meal. 08/29/16   Pleas Koch, NP  Iron-Folic Acid-Vit Y18 (IRON FORMULA PO) Take 65 mg by mouth daily.    Historical Provider, MD  lisinopril-hydrochlorothiazide (PRINZIDE,ZESTORETIC) 20-12.5 MG tablet Take 1 tablet by mouth daily. 02/06/16   Pleas Koch, NP  naproxen (NAPROSYN) 500 MG tablet Take 1 tablet (500 mg total) by mouth 2 (two) times daily as needed. 09/28/16   Elby Beck, FNP  omeprazole (PRILOSEC) 40 MG capsule Take 1 capsule (40 mg total) by mouth daily. 02/06/16   Pleas Koch, NP  oxybutynin (DITROPAN) 5 MG tablet Take 1 tablet (5 mg total) by mouth 3 (three) times daily. 02/06/16   Pleas Koch, NP  pravastatin (PRAVACHOL) 40 MG tablet Take 1 tablet (  40 mg total) by mouth daily. 02/06/16   Doreene Nest, NP  triamcinolone cream (KENALOG) 0.1 % Apply 1 application topically 2 (two) times daily. 08/30/16   Doreene Nest, NP  VITAMIN D, CHOLECALCIFEROL, PO Take 5,000 Units by mouth daily.    Historical Provider, MD    Allergies Patient has no known allergies.  Family History  Problem Relation Age of Onset  . Lung cancer Father   . Alzheimer's disease Mother     Social History Social History  Substance Use Topics  . Smoking status: Never Smoker  . Smokeless tobacco: Never Used  . Alcohol use 0.0  oz/week     Comment: rarely    Review of Systems Constitutional: No fever/chills Eyes: No visual changes. ENT: No sore throat. No stiff neck no neck pain Cardiovascular: Denies chest pain. Respiratory: Denies shortness of breath. Gastrointestinal:   no vomiting.  No diarrhea.  No constipation. Genitourinary: Negative for dysuria. Musculoskeletal: Negative lower extremity swelling Skin: Negative for rash. Neurological: Negative for severe headaches, focal weakness or numbness. 10-point ROS otherwise negative.  ____________________________________________   PHYSICAL EXAM:  VITAL SIGNS: ED Triage Vitals  Enc Vitals Group     BP 10/10/16 1500 (!) 221/89     Pulse Rate 10/10/16 1500 60     Resp 10/10/16 1649 16     Temp --      Temp src --      SpO2 10/10/16 1500 91 %     Weight 10/10/16 1453 300 lb (136.1 kg)     Height 10/10/16 1453 5\' 8"  (1.727 m)     Head Circumference --      Peak Flow --      Pain Score 10/10/16 1453 8     Pain Loc --      Pain Edu? --      Excl. in GC? --     Constitutional: Alert and oriented. Well appearing and in no acute distress. Eyes: Conjunctivae are normal. PERRL. EOMI. Head: Large hematoma noted to the right forehead with no evidence of skull fracture,. Nose: No congestion/rhinnorhea. Mouth/Throat: Mucous membranes are moist.  Oropharynx non-erythematous. Neck: No stridor.   Nontender with no meningismus Cardiovascular: Normal rate, regular rhythm. Grossly normal heart sounds.  Good peripheral circulation. Respiratory: Normal respiratory effort.  No retractions. Lungs CTAB. Abdominal: Soft and nontender. No distention. No guarding no rebound Back:  There is no focal tenderness or step off.  there is no midline tenderness there are no lesions noted. there is no CVA tenderness Musculoskeletal: No lower extremity tenderness, no upper extremity tenderness. No joint effusions, no DVT signs strong distal pulses no edema Neurologic:  Normal  speech and language. No gross focal neurologic deficits are appreciated.  Skin:  Skin is warm, dry and intact. No rash noted. Psychiatric: Mood and affect are anxious. Speech and behavior are normal.  ____________________________________________   LABS (all labs ordered are listed, but only abnormal results are displayed)  Labs Reviewed  CBC WITH DIFFERENTIAL/PLATELET - Abnormal; Notable for the following:       Result Value   WBC 13.0 (*)    Neutro Abs 11.4 (*)    Lymphs Abs 0.7 (*)    All other components within normal limits  PROTIME-INR  BASIC METABOLIC PANEL  TROPONIN I   ____________________________________________  EKG  I personally interpreted any EKGs ordered by me or triage Normal sinus rhythm rate 59 bpm no acute ST admission acute ST depression normal axis unremarkable EKG  ____________________________________________  [id number]  I reviewed any imaging ordered by me or triage that were performed during my shift and, if possible, patient and/or family made aware of any abnormal findings. ____________________________________________   PROCEDURES  Procedure(s) performed: None  Procedures  Critical Care performed: None  ____________________________________________   INITIAL IMPRESSION / ASSESSMENT AND PLAN / ED COURSE  Pertinent labs & imaging results that were available during my care of the patient were reviewed by me and considered in my medical decision making (see chart for details).  Patient in no acute distress but very anxious and upset. Has a large hematoma. However I'm able to pull back the bruising her eye shows no evidence of hyphema or other damage. The patient initial blood pressure was elevated, conservatively but she was very anxious upset and in pain. We have given her pain medication and her blood pressure standing and appropriately. I'm reluctant to aggressively treat her with cardiac active medications given that I believe this is mostly  situational. Because of the size of the hematoma, which is considerable, we are placing an Ace wrap after we have placed ice on it, and we will check basic blood work including coags and platelet count. 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 She agrees with all of her management and care, and we are going to observe the patient even though this was a syncopal fall we will do a workup.   ----------------------------------------- 6:14 PM on 10/10/2016 -----------------------------------------  Patient in no acute distress and laboratory here in the department. There was significant hemolysis and some of the blood that they sent them. Family would prefer not to wait for repeat blood draw. She is not on any blood thinners she remains neurologically intact with no other complaints she has some slight abrasions which are showing up on Neurontin and no evidence of fracture. Tetanus is up-to-date. We will discharge her therefore the care of her family. Return precautions and follow-up given and understood. This is a large hematoma but there is no evidence of underlying pathology including ocular pathology, and I have advised him that it'll take several weeks to resolve. Platelet count is reassuring. The pressure is trending down. Initial blood pressure was likely artificially elevated because the cuff was too small for her arm. We checked it is certainly improved and his since improved even more. Family would prefer to watch this at home. Daughter as mentioned is a Marine scientist. They will stay with her. I do not think that there is any indication for acute medication to bring down this patient's heart she is still anxious and a little upset about her fall. At this time, I think discharge is a reasonable option and we will do so.   Clinical Course    ____________________________________________   FINAL CLINICAL IMPRESSION(S) / ED DIAGNOSES  Final diagnoses:  Syncope      This chart  was dictated using voice recognition software.  Despite best efforts to proofread,  errors can occur which can change meaning.      Schuyler Amor, MD 10/10/16 [place], MD 10/10/16 [place], MD 10/10/16 [place], MD 10/10/16 [phone]

## 2016-10-10 NOTE — ED Triage Notes (Signed)
Pt was out to eat and tripped getting into car.  Arrived via ems, a&o, hematoma to right forehead

## 2016-10-10 NOTE — Discharge Instructions (Signed)
[name] blood pressure was somewhat elevated here today. Please have that rechecked in the next day or 2. Take your blood pressure medications as prescribed. If he have chest pain, headache or any other new or worrisome symptoms return to the emergency room. Take Tylenol for the pain from her fall. He have persistent vomiting or lethargy worsening headache or any other new or worrisome symptoms of course return to the hospital.

## 2016-10-10 NOTE — ED Notes (Signed)
Pt ambulated to bathroom with stand-by assist x2.

## 2016-10-11 ENCOUNTER — Ambulatory Visit: Payer: Medicare Other | Admitting: Podiatry

## 2016-10-15 DIAGNOSIS — G4730 Sleep apnea, unspecified: Secondary | ICD-10-CM | POA: Diagnosis not present

## 2016-10-24 ENCOUNTER — Encounter: Payer: Self-pay | Admitting: Primary Care

## 2016-10-24 ENCOUNTER — Ambulatory Visit (INDEPENDENT_AMBULATORY_CARE_PROVIDER_SITE_OTHER): Payer: Medicare Other | Admitting: Primary Care

## 2016-10-24 VITALS — BP 130/84 | HR 78 | Temp 97.3°F | Ht 68.0 in | Wt 267.1 lb

## 2016-10-24 DIAGNOSIS — T148XXA Other injury of unspecified body region, initial encounter: Secondary | ICD-10-CM | POA: Diagnosis not present

## 2016-10-24 DIAGNOSIS — R110 Nausea: Secondary | ICD-10-CM

## 2016-10-24 MED ORDER — TRAMADOL HCL 50 MG PO TABS: 50.0000 mg | ORAL_TABLET | Freq: Three times a day (TID) | ORAL | 0 refills | Status: DC | PRN

## 2016-10-24 MED ORDER — ONDANSETRON HCL 4 MG PO TABS: 4.0000 mg | ORAL_TABLET | Freq: Three times a day (TID) | ORAL | 0 refills | Status: DC | PRN

## 2016-10-24 NOTE — Progress Notes (Signed)
Subjective:    Patient ID: Megan Obrien, female    DOB: 23-Apr-1946, 71 y.o.   MRN: [id number]  HPI  Megan Obrien is a 71 year old female who presents today for emergency department follow up.  She presented to [facility] ED on 10/10/16 with a chief complaint of fall. She was walking down a ramp with her walker and was distracted by an object on her shirt, she accidentally tripped and fell onto the ground. She did not have any neck pain and did not experience a syncopal episode. She did have a large hematoma to the right forehead and eye.  During her stay in the ED she underwent ECG (NSR, no acute ST changes, normal axis), Chest xray (no fracture), CT scan of her head which was showed mild small vessel disease, multiple areas of arterial calcifications, large hematoma; otherwise unremarkable. Her BP was noted to be very high at 208/81, but this improved during her say and was thought to be situational. They attempted to draw blood work including CBC, but the samples were hemolyzed. Given that she is not on blood thinners, normal neuro exam, and family to monitor her at home, she was discharged home.  Since her visit her hematoma and bruising has drastically decreased. She's noticed headaches to the site of the hematoma. Her family denies confusion, changes in consciousness. She's not taken any ibuprofen, naproxen; only tylenol. She's applying an ice pack once daily.   Review of Systems  Constitutional: Negative for fever.  Respiratory: Negative for shortness of breath.   Cardiovascular: Negative for chest pain.  Skin: Positive for color change and wound.       Hematoma   Neurological: Positive for headaches. Negative for dizziness and weakness.  Psychiatric/Behavioral: Negative for confusion.       Past Medical History:  Diagnosis Date  . Arthritis   . Depression   . GERD (gastroesophageal reflux disease)   . Hyperlipidemia   . Murmur   . Seasonal allergies   . Type 2 diabetes  mellitus ([place])   . Urinary incontinence      Social History   Social History  . Marital status: Widowed    Spouse name: N/A  . Number of children: N/A  . Years of education: N/A   Occupational History  . Not on file.   Social History Main Topics  . Smoking status: Never Smoker  . Smokeless tobacco: Never Used  . Alcohol use 0.0 oz/week     Comment: rarely  . Drug use: No  . Sexual activity: Not on file   Other Topics Concern  . Not on file   Social History Narrative   Widow.   2 children, numerous grandchildren.   Retired. Once worked on a Publishing copy.   Enjoys playing computer games, spending time with family.    Past Surgical History:  Procedure Laterality Date  . BACK SURGERY    . KNEE SURGERY Left   . TONSILLECTOMY AND ADENOIDECTOMY  1951    Family History  Problem Relation Age of Onset  . Lung cancer Father   . Alzheimer's disease Mother     No Known Allergies  Current Outpatient Prescriptions on File Prior to Visit  Medication Sig Dispense Refill  . ACCU-CHEK AVIVA PLUS test strip USE TO TEST BLOOD SUGAR ONE TO TWO TIMES DAILY AND AS DIRECTED 300 each 2  . Blood Glucose Monitoring Suppl (ACCU-CHEK AVIVA PLUS) w/Device KIT Use as directed to check blood sugar 2 times daily.  1 kit 0  . carvedilol (COREG) 6.25 MG tablet Take 2 tablets (12.5 mg total) by mouth 2 (two) times daily with a meal. 360 tablet 2  . FLUoxetine (PROZAC) 40 MG capsule Take 1 capsule (40 mg total) by mouth daily. 90 capsule 2  . glimepiride (AMARYL) 4 MG tablet Take 1 tablet (4 mg total) by mouth 2 (two) times daily with a meal. 180 tablet 0  . Iron-Folic Acid-Vit H20 (IRON FORMULA PO) Take 65 mg by mouth daily.    [name] lisinopril-hydrochlorothiazide (PRINZIDE,ZESTORETIC) 20-12.5 MG tablet Take 1 tablet by mouth daily. 90 tablet 2  . naproxen (NAPROSYN) 500 MG tablet Take 1 tablet (500 mg total) by mouth 2 (two) times daily as needed. 20 tablet 0  . omeprazole (PRILOSEC) 40 MG capsule  Take 1 capsule (40 mg total) by mouth daily. 90 capsule 2  . oxybutynin (DITROPAN) 5 MG tablet Take 1 tablet (5 mg total) by mouth 3 (three) times daily. 270 tablet 2  . pravastatin (PRAVACHOL) 40 MG tablet Take 1 tablet (40 mg total) by mouth daily. 90 tablet 2  . triamcinolone cream (KENALOG) 0.1 % Apply 1 application topically 2 (two) times daily. 30 g 0  . VITAMIN D, CHOLECALCIFEROL, PO Take 5,000 Units by mouth daily.     No current facility-administered medications on file prior to visit.     BP 130/84   Pulse 78   Temp 97.3 F (36.3 C) (Oral)   Ht _0  (1.727 m)   Wt 267 lb 1.9 oz (121.2 kg)   SpO2 95%   BMI 40.62 kg/m    Objective:   Physical Exam  Constitutional: She is oriented to person, place, and time. She appears well-nourished.  Eyes: EOM are normal. Pupils are equal, round, and reactive to light.  Neck: Neck supple. Normal range of motion present.  Cardiovascular: Normal rate and regular rhythm.   Pulmonary/Chest: Effort normal and breath sounds normal.  Neurological: She is alert and oriented to person, place, and time. No cranial nerve deficit.  Skin: Skin is warm and dry.  Golf ball sized rounded hematoma, colored purple/light blue to right forehead proximal and lateral to right eye. Scabbing present. Bruising to right shoulder and neck, right face; healing well.          Assessment & Plan:  Emergency Department Follow Up:  Presented to [facility] ED after fall. ED workup overall unremarkable except for large hematoma. She's had no confusion, dizziness, increased headaches since being home. Neuro exam today unremarkable. Wounds appear to be healing well. Discussed that hematoma will decrease with time, increase frequency of ice packs.  Rx for Tramadol provided to use PRN pain. Discussed to refrain from NSAIDS. Strict return precautions provided including dizziness, changes in mental status, increased headache, enlarging hematoma. Patient and family  verbalized understanding.  All hospital notes and imaging reviewed. Sheral Flow, NP

## 2016-10-24 NOTE — Patient Instructions (Signed)
You may take the ondansetron (Zofran) tablets every 8 hours as needed for nausea.  You may take Tramadol every 8 hours as needed for pain. Caution as this may cause drowsiness.  Please notify me if you develop increased headaches, dizziness, feeling off balance, confusion.  It was a pleasure to see you today!

## 2016-10-24 NOTE — Progress Notes (Signed)
Pre visit review using our clinic review tool, if applicable. No additional management support is needed unless otherwise documented below in the visit note. 

## 2016-11-15 DIAGNOSIS — G4730 Sleep apnea, unspecified: Secondary | ICD-10-CM | POA: Diagnosis not present

## 2016-12-13 DIAGNOSIS — G4730 Sleep apnea, unspecified: Secondary | ICD-10-CM | POA: Diagnosis not present

## 2016-12-31 ENCOUNTER — Telehealth: Payer: Self-pay | Admitting: Primary Care

## 2016-12-31 ENCOUNTER — Other Ambulatory Visit: Payer: Self-pay | Admitting: Primary Care

## 2016-12-31 DIAGNOSIS — F329 Major depressive disorder, single episode, unspecified: Secondary | ICD-10-CM

## 2016-12-31 DIAGNOSIS — E785 Hyperlipidemia, unspecified: Secondary | ICD-10-CM

## 2016-12-31 DIAGNOSIS — K219 Gastro-esophageal reflux disease without esophagitis: Secondary | ICD-10-CM

## 2016-12-31 DIAGNOSIS — E1129 Type 2 diabetes mellitus with other diabetic kidney complication: Secondary | ICD-10-CM

## 2016-12-31 DIAGNOSIS — R809 Proteinuria, unspecified: Secondary | ICD-10-CM

## 2016-12-31 DIAGNOSIS — F32A Depression, unspecified: Secondary | ICD-10-CM

## 2016-12-31 DIAGNOSIS — I10 Essential (primary) hypertension: Secondary | ICD-10-CM

## 2016-12-31 NOTE — Telephone Encounter (Signed)
Patient has two prescriptions of glimepiride 4 mg on file. I just refilled the one that was sent through refill request. Please verify how much she's taking and delete the other. Also see note for office visit due in April 2018.

## 2017-01-01 NOTE — Telephone Encounter (Signed)
Spoken and notified patient of Kate's comments. I have updated the medication list.  Also patient stated that she does not have anyone to take to the doctor. Her daughter just started a new job. She does have someone who may be able to bring her so she will call back later.

## 2017-01-01 NOTE — Telephone Encounter (Signed)
Noted  

## 2017-01-01 NOTE — Telephone Encounter (Signed)
See other telephone note.  

## 2017-01-13 DIAGNOSIS — G4730 Sleep apnea, unspecified: Secondary | ICD-10-CM | POA: Diagnosis not present

## 2017-01-15 ENCOUNTER — Encounter: Payer: Self-pay | Admitting: Primary Care

## 2017-01-15 ENCOUNTER — Ambulatory Visit (INDEPENDENT_AMBULATORY_CARE_PROVIDER_SITE_OTHER): Payer: Medicare Other | Admitting: Primary Care

## 2017-01-15 VITALS — BP 136/88 | HR 62 | Temp 98.2°F | Ht 68.0 in | Wt 280.0 lb

## 2017-01-15 DIAGNOSIS — E1129 Type 2 diabetes mellitus with other diabetic kidney complication: Secondary | ICD-10-CM

## 2017-01-15 DIAGNOSIS — E785 Hyperlipidemia, unspecified: Secondary | ICD-10-CM

## 2017-01-15 DIAGNOSIS — F331 Major depressive disorder, recurrent, moderate: Secondary | ICD-10-CM

## 2017-01-15 DIAGNOSIS — E118 Type 2 diabetes mellitus with unspecified complications: Secondary | ICD-10-CM

## 2017-01-15 DIAGNOSIS — N3941 Urge incontinence: Secondary | ICD-10-CM | POA: Diagnosis not present

## 2017-01-15 DIAGNOSIS — I10 Essential (primary) hypertension: Secondary | ICD-10-CM | POA: Diagnosis not present

## 2017-01-15 DIAGNOSIS — R809 Proteinuria, unspecified: Secondary | ICD-10-CM

## 2017-01-15 LAB — LIPID PANEL
Cholesterol: 198 mg/dL (ref 0–200)
HDL: 48.9 mg/dL (ref >39.00)
LDL Cholesterol: 125 mg/dL — ABNORMAL HIGH (ref 0–99)
NONHDL: 149.56
Total CHOL/HDL Ratio: 4
Triglycerides: 121 mg/dL (ref 0.0–149.0)
VLDL: 24.2 mg/dL (ref 0.0–40.0)

## 2017-01-15 LAB — BASIC METABOLIC PANEL
BUN: 26 mg/dL — ABNORMAL HIGH (ref 6–23)
CALCIUM: 9.7 mg/dL (ref 8.4–10.5)
CO2: 32 mEq/L (ref 19–32)
Chloride: 99 mEq/L (ref 96–112)
Creatinine, Ser: 1.31 mg/dL — ABNORMAL HIGH (ref 0.40–1.20)
GFR: 42.4 mL/min — ABNORMAL LOW (ref >60.00)
Glucose, Bld: 158 mg/dL — ABNORMAL HIGH (ref 70–99)
POTASSIUM: 5 meq/L (ref 3.5–5.1)
SODIUM: 137 meq/L (ref 135–145)

## 2017-01-15 LAB — HEMOGLOBIN A1C: Hgb A1c MFr Bld: 8.2 % — ABNORMAL HIGH (ref 4.6–6.5)

## 2017-01-15 MED ORDER — BUPROPION HCL ER (SR) 150 MG PO TB12: 150.0000 mg | ORAL_TABLET | Freq: Two times a day (BID) | ORAL | 1 refills | Status: DC

## 2017-01-15 NOTE — Assessment & Plan Note (Signed)
Continues with urgency. Would recommend we try Myrbetric but will wait until next visit to initiate given initiation of the antidepressant.

## 2017-01-15 NOTE — Assessment & Plan Note (Signed)
Continue pravastatin for now, check lipids today.

## 2017-01-15 NOTE — Assessment & Plan Note (Signed)
Deteriorated and seems to be the cause of her overall health decline. PHQ 9 score of 13 today. Offered therapy for which I suspect she would greatly benefit from, she declines. Discussed other treatment options, will add in Wellbutrin 150 mg BID for depression. Continue Fluoxetine. Close follow up in 1 month for re-evaluation.

## 2017-01-15 NOTE — Assessment & Plan Note (Signed)
Stable today, continue current regimen.

## 2017-01-15 NOTE — Progress Notes (Signed)
Pre visit review using our clinic review tool, if applicable. No additional management support is needed unless otherwise documented below in the visit note. 

## 2017-01-15 NOTE — Assessment & Plan Note (Signed)
Fasting sugars in the mid 200's, A1C pending today. Continue glimepiride 4 mg for now until results received. Poor diet and is inactive, education provided regarding diabetic diet and regular exercise. She declines referral to diabetic nutritionist as she's "heard it all before".

## 2017-01-15 NOTE — Progress Notes (Signed)
Subjective:    Patient ID: Megan Obrien, female    DOB: 08/21/1946, 71 y.o.   MRN: [id number]  HPI  [name] is a 71 year old female who presents today for follow up.  1) Type 2 Diabetes: Currently managed on glimepiride 4 mg daily which is an increase from her 2 mg tablets in October 2017. Her last A1C was 8.2 in October 2017. She was previously managed on Metformin which was discontinued due to decreased renal function.   She's checking her sugars fasting in the morning and in the evening which is running in the mid to upper 200's.  She endorses a poor diet and eats mostly at restaurants or canned/boxed food at home. She does eat sugar free cookies and candy, she's drinking mostly diet soda. She has experienced increased symptoms of depression and has been binge eating since her last visit.  2) Essential Hypertension: Currently managed on carvedilol 6.25 mg BID, lisinopril-HCTZ 20/12.5 mg. Her BP in the office today is 136/88. She's not checking her blood pressure at home. She denies chest pain, dizziness. She's continued to experience an intermittent headache since her fall in late January 2018, overall improved.  3) Hyperlipidemia: She is currently managed on pravastatin 40 mg. Last lipid panel with triglycerides above goal in October 2017. She is fasting today.   4) Depression: Currently managed on Fluoxetine 40 mg. Her children have noticed symptoms of irritability, depression, and food binging. She doesn't feel as though she's irritable, but does feel lonely when she's at home by herself. She has difficulty falling asleep and is taking Melatonin 10 mg every night at bedtime without improvement. She will sleep 2 hours every night as she can't stop her mind racing thoughts. She is concerned about her age and how she's getting older. She is concerned about Alzheimer's disease as she has a strong family history of this.  PHQ 9 score of 13 today. She denies SI/HI.  5) Urinary  Incontinence: Currently managed on oxybutynin 5 mg TID. She is up to the bathroom every 2 hours. She does wet herself every so often, but this has overall improved since she was initiated on oxybutynin.    Review of Systems  Eyes: Negative for visual disturbance.  Respiratory: Negative for shortness of breath.   Cardiovascular: Negative for chest pain.  Genitourinary:       Urinary incontinence   Neurological: Positive for headaches. Negative for dizziness and weakness.  Psychiatric/Behavioral: Positive for sleep disturbance. Negative for confusion and suicidal ideas. The patient is not nervous/anxious.        Depression, see HPI       Past Medical History:  Diagnosis Date  . Arthritis   . Depression   . GERD (gastroesophageal reflux disease)   . Hyperlipidemia   . Murmur   . Seasonal allergies   . Type 2 diabetes mellitus ([place])   . Urinary incontinence      Social History   Social History  . Marital status: Widowed    Spouse name: N/A  . Number of children: N/A  . Years of education: N/A   Occupational History  . Not on file.   Social History Main Topics  . Smoking status: Never Smoker  . Smokeless tobacco: Never Used  . Alcohol use 0.0 oz/week     Comment: rarely  . Drug use: No  . Sexual activity: Not on file   Other Topics Concern  . Not on file   Social History Narrative  Widow.   2 children, numerous grandchildren.   Retired. Once worked on a Publishing copy.   Enjoys playing computer games, spending time with family.    Past Surgical History:  Procedure Laterality Date  . BACK SURGERY    . KNEE SURGERY Left   . TONSILLECTOMY AND ADENOIDECTOMY  1951    Family History  Problem Relation Age of Onset  . Lung cancer Father   . Alzheimer's disease Mother     No Known Allergies  Current Outpatient Prescriptions on File Prior to Visit  Medication Sig Dispense Refill  . ACCU-CHEK AVIVA PLUS test strip USE TO TEST BLOOD SUGAR ONE TO TWO TIMES DAILY  AND AS DIRECTED 300 each 2  . Blood Glucose Monitoring Suppl (ACCU-CHEK AVIVA PLUS) w/Device KIT Use as directed to check blood sugar 2 times daily. 1 kit 0  . carvedilol (COREG) 6.25 MG tablet Take 2 tablets (12.5 mg total) by mouth 2 (two) times daily with a meal. 360 tablet 2  . FLUoxetine (PROZAC) 40 MG capsule TAKE ONE CAPSULE BY MOUTH ONCE DAILY 90 capsule 2  . glimepiride (AMARYL) 4 MG tablet TAKE ONE TABLET BY MOUTH ONCE DAILY WITH BREAKFAST 90 tablet 0  . Iron-Folic Acid-Vit H15 (IRON FORMULA PO) Take 65 mg by mouth daily.    [name] lisinopril-hydrochlorothiazide (PRINZIDE,ZESTORETIC) 20-12.5 MG tablet TAKE ONE TABLET BY MOUTH ONCE DAILY 90 tablet 2  . omeprazole (PRILOSEC) 40 MG capsule TAKE ONE CAPSULE BY MOUTH ONCE DAILY 90 capsule 1  . oxybutynin (DITROPAN) 5 MG tablet Take 1 tablet (5 mg total) by mouth 3 (three) times daily. 270 tablet 2  . pravastatin (PRAVACHOL) 40 MG tablet TAKE ONE TABLET BY MOUTH ONCE DAILY 90 tablet 1  . triamcinolone cream (KENALOG) 0.1 % Apply 1 application topically 2 (two) times daily. 30 g 0  . VITAMIN D, CHOLECALCIFEROL, PO Take 5,000 Units by mouth daily.     No current facility-administered medications on file prior to visit.     BP 136/88   Pulse 62   Temp 98.2 F (36.8 C) (Oral)   Ht '5\' 8"'$  (1.727 m)   Wt 280 lb (127 kg)   SpO2 98%   BMI 42.57 kg/m    Objective:   Physical Exam  Constitutional: She appears well-nourished.  Neck: Neck supple.  Cardiovascular: Normal rate and regular rhythm.   Pulmonary/Chest: Effort normal and breath sounds normal. She has no wheezes.  Skin: Skin is warm and dry.  Very slight bruising in the healing stage to right frontal lobe just proximal to eye from prior injury in January.  Psychiatric: She has a normal mood and affect.          Assessment & Plan:

## 2017-01-15 NOTE — Patient Instructions (Signed)
Complete lab work prior to leaving today. I will notify you of your results once received.   Start bupropion 150 SR tablets for depression. Take 1 tablet by mouth once daily for 5 days, then increase to 1 tablet twice daily thereafter.  It's important to improve your diet by reducing consumption of fast food, fried food, processed snack foods, sugary drinks. Increase consumption of fresh vegetables and fruits, whole grains, water.  Ensure you are drinking 64 ounces of water daily.  Start walking daily, even if you can walk 5 minutes at a time. Gradually build up from that.   Please schedule a follow up visit in 1 month for re-evaluation of depression.

## 2017-01-17 ENCOUNTER — Other Ambulatory Visit: Payer: Self-pay | Admitting: Primary Care

## 2017-01-17 DIAGNOSIS — E785 Hyperlipidemia, unspecified: Secondary | ICD-10-CM

## 2017-01-17 MED ORDER — ATORVASTATIN CALCIUM 40 MG PO TABS: 40.0000 mg | ORAL_TABLET | Freq: Every day | ORAL | 1 refills | Status: DC

## 2017-01-24 LAB — HM DIABETES EYE EXAM

## 2017-01-29 LAB — HM DIABETES EYE EXAM

## 2017-02-12 DIAGNOSIS — G4730 Sleep apnea, unspecified: Secondary | ICD-10-CM | POA: Diagnosis not present

## 2017-02-20 ENCOUNTER — Ambulatory Visit (INDEPENDENT_AMBULATORY_CARE_PROVIDER_SITE_OTHER): Payer: Medicare Other | Admitting: Primary Care

## 2017-02-20 VITALS — BP 140/80 | HR 79 | Temp 97.9°F | Ht 68.0 in | Wt 270.8 lb

## 2017-02-20 DIAGNOSIS — R531 Weakness: Secondary | ICD-10-CM

## 2017-02-20 DIAGNOSIS — F3341 Major depressive disorder, recurrent, in partial remission: Secondary | ICD-10-CM

## 2017-02-20 DIAGNOSIS — I10 Essential (primary) hypertension: Secondary | ICD-10-CM

## 2017-02-20 DIAGNOSIS — R5383 Other fatigue: Secondary | ICD-10-CM | POA: Diagnosis not present

## 2017-02-20 DIAGNOSIS — R011 Cardiac murmur, unspecified: Secondary | ICD-10-CM

## 2017-02-20 DIAGNOSIS — R296 Repeated falls: Secondary | ICD-10-CM | POA: Diagnosis not present

## 2017-02-20 MED ORDER — FLUTICASONE PROPIONATE 50 MCG/ACT NA SUSP: 2.0000 | Freq: Every day | NASAL | 1 refills | Status: DC

## 2017-02-20 NOTE — Patient Instructions (Addendum)
You will be contacted regarding your echocardiogram and also for physical therapy.  Please let us know if you have not heard back within one week.   Schedule a lab only appointment after July 17th to recheck your diabetes levels.  Check your blood pressure daily, around the same time of day, for the next 2 weeks.  Ensure that you have rested for 30 minutes prior to checking your blood pressure. Record your readings and I'll call you for those readings.  It was a pleasure to see you today!

## 2017-02-20 NOTE — Assessment & Plan Note (Signed)
Mood today seems improved when compared to last visit. Continue Wellbutrin and fluoxetine for now.

## 2017-02-20 NOTE — Progress Notes (Signed)
Subjective:    Patient ID: Megan Obrien, female    DOB: Feb 06, 1946, 71 y.o.   MRN: [id number]  HPI  Megan Obrien is a 71 year old female who presents today for follow up of depression. She was last evaluated in April 2018 with symptoms of irritability, depression, food binging, difficulty sleeping. PHQ 9 score of 13 on Fluoxetine 40 mg. She kindly declined therapy last visit so we added in Wellbutrin 150 mg BID.   Since her last visit she's not noticed much difference, although she never thought she had depression to begin with. She has lost 10 pounds since her last visit and has reduced her binge eating.  She continues to feel dizzy, feels off balance, blurred vision, generalized weakness, and "feels bad" all the time. She's felt this way for the past 6-8 months. She has noticed intermittent sharp pain to the site of her hematoma that has been present since her fall in January 2018. She has fallen several times in the past several months as she loses her balance. She is home bound and depends on others for transportation. She does use her walker in her home.  She feels dizziness every time when walking, sometimes when sitting. She wears glasses with magnifiers during the day, cannot wear them when walking. She's not had an eye exam in 1 year.   She's checking her BP infrequently and got a reading of 175/84 this morning. Her blood pressure in the office today is 140/80. She denies chest pain, shortness of breath, headaches. She is inactive most of her day, struggles with chronic knee pain.  Review of Systems  Constitutional: Positive for fatigue.  Eyes: Positive for visual disturbance.  Respiratory: Negative for shortness of breath.   Cardiovascular: Negative for chest pain.  Musculoskeletal: Positive for arthralgias.  Neurological: Positive for dizziness.  Psychiatric/Behavioral:       Denies depression       Past Medical History:  Diagnosis Date  . Arthritis   . Depression   .  GERD (gastroesophageal reflux disease)   . Hyperlipidemia   . Murmur   . Seasonal allergies   . Type 2 diabetes mellitus ([place])   . Urinary incontinence      Social History   Social History  . Marital status: Widowed    Spouse name: N/A  . Number of children: N/A  . Years of education: N/A   Occupational History  . Not on file.   Social History Main Topics  . Smoking status: Never Smoker  . Smokeless tobacco: Never Used  . Alcohol use 0.0 oz/week     Comment: rarely  . Drug use: No  . Sexual activity: Not on file   Other Topics Concern  . Not on file   Social History Narrative   Widow.   2 children, numerous grandchildren.   Retired. Once worked on a Publishing copy.   Enjoys playing computer games, spending time with family.    Past Surgical History:  Procedure Laterality Date  . BACK SURGERY    . KNEE SURGERY Left   . TONSILLECTOMY AND ADENOIDECTOMY  1951    Family History  Problem Relation Age of Onset  . Lung cancer Father   . Alzheimer's disease Mother     No Known Allergies  Current Outpatient Prescriptions on File Prior to Visit  Medication Sig Dispense Refill  . ACCU-CHEK AVIVA PLUS test strip USE TO TEST BLOOD SUGAR ONE TO TWO TIMES DAILY AND AS DIRECTED 300 each  2  . atorvastatin (LIPITOR) 40 MG tablet Take 1 tablet (40 mg total) by mouth daily. 90 tablet 1  . Blood Glucose Monitoring Suppl (ACCU-CHEK AVIVA PLUS) w/Device KIT Use as directed to check blood sugar 2 times daily. 1 kit 0  . buPROPion (WELLBUTRIN SR) 150 MG 12 hr tablet Take 1 tablet (150 mg total) by mouth 2 (two) times daily. 60 tablet 1  . carvedilol (COREG) 6.25 MG tablet Take 2 tablets (12.5 mg total) by mouth 2 (two) times daily with a meal. 360 tablet 2  . FLUoxetine (PROZAC) 40 MG capsule TAKE ONE CAPSULE BY MOUTH ONCE DAILY 90 capsule 2  . glimepiride (AMARYL) 4 MG tablet TAKE ONE TABLET BY MOUTH ONCE DAILY WITH BREAKFAST 90 tablet 0  . Iron-Folic Acid-Vit Q33 (IRON FORMULA PO)  Take 65 mg by mouth daily.    [name] lisinopril-hydrochlorothiazide (PRINZIDE,ZESTORETIC) 20-12.5 MG tablet TAKE ONE TABLET BY MOUTH ONCE DAILY 90 tablet 2  . omeprazole (PRILOSEC) 40 MG capsule TAKE ONE CAPSULE BY MOUTH ONCE DAILY 90 capsule 1  . oxybutynin (DITROPAN) 5 MG tablet Take 1 tablet (5 mg total) by mouth 3 (three) times daily. 270 tablet 2  . triamcinolone cream (KENALOG) 0.1 % Apply 1 application topically 2 (two) times daily. 30 g 0  . VITAMIN D, CHOLECALCIFEROL, PO Take 5,000 Units by mouth daily.     No current facility-administered medications on file prior to visit.     BP 140/80   Pulse 79   Temp 97.9 F (36.6 C) (Oral)   Ht 5' 8" (1.727 m)   Wt 270 lb 12.8 oz (122.8 kg)   SpO2 95%   BMI 41.17 kg/m    Objective:   Physical Exam  Constitutional: She is oriented to person, place, and time. She appears well-nourished.  Eyes: EOM are normal.  Neck: Neck supple.  Cardiovascular: Normal rate and regular rhythm.   Pulmonary/Chest: Effort normal and breath sounds normal.  Musculoskeletal:  Strength to upper extremities equal bilaterally.  Neurological: She is alert and oriented to person, place, and time. No cranial nerve deficit.  Skin: Skin is warm and dry.  Evidence of vascular disease to lower extremities  Psychiatric: She has a normal mood and affect.  Seems more upbeat today          Assessment & Plan:

## 2017-02-20 NOTE — Assessment & Plan Note (Addendum)
Overall stable today, normal on previous visits. Will have her monitor blood pressure at home and call her in 2 weeks for readings.

## 2017-02-20 NOTE — Assessment & Plan Note (Signed)
Ongoing for the past 8-9 months. Could be related to depression, currently treating. Will check echocardiogram given history of chronic murmur coupled with obesity, fatigue and vascular disease to lower extremities. Referral placed for home health physical therapy to help with balance and strength.

## 2017-02-21 MED ORDER — FLUTICASONE PROPIONATE 50 MCG/ACT NA SUSP: 1.0000 | Freq: Every day | NASAL | 1 refills | Status: DC

## 2017-02-21 NOTE — Addendum Note (Signed)
Addended by: Jacqualin Combes on: 02/21/2017 12:18 PM   Modules accepted: Orders

## 2017-02-22 ENCOUNTER — Encounter: Payer: Self-pay | Admitting: Primary Care

## 2017-02-26 DIAGNOSIS — Z9981 Dependence on supplemental oxygen: Secondary | ICD-10-CM | POA: Diagnosis not present

## 2017-02-26 DIAGNOSIS — M1991 Primary osteoarthritis, unspecified site: Secondary | ICD-10-CM | POA: Diagnosis not present

## 2017-02-26 DIAGNOSIS — E785 Hyperlipidemia, unspecified: Secondary | ICD-10-CM | POA: Diagnosis not present

## 2017-02-26 DIAGNOSIS — M6281 Muscle weakness (generalized): Secondary | ICD-10-CM | POA: Diagnosis not present

## 2017-02-26 DIAGNOSIS — E119 Type 2 diabetes mellitus without complications: Secondary | ICD-10-CM | POA: Diagnosis not present

## 2017-02-26 DIAGNOSIS — R296 Repeated falls: Secondary | ICD-10-CM | POA: Diagnosis not present

## 2017-02-26 DIAGNOSIS — I10 Essential (primary) hypertension: Secondary | ICD-10-CM | POA: Diagnosis not present

## 2017-02-28 DIAGNOSIS — E785 Hyperlipidemia, unspecified: Secondary | ICD-10-CM | POA: Diagnosis not present

## 2017-02-28 DIAGNOSIS — I10 Essential (primary) hypertension: Secondary | ICD-10-CM | POA: Diagnosis not present

## 2017-02-28 DIAGNOSIS — M1991 Primary osteoarthritis, unspecified site: Secondary | ICD-10-CM | POA: Diagnosis not present

## 2017-02-28 DIAGNOSIS — E119 Type 2 diabetes mellitus without complications: Secondary | ICD-10-CM | POA: Diagnosis not present

## 2017-02-28 DIAGNOSIS — Z9981 Dependence on supplemental oxygen: Secondary | ICD-10-CM | POA: Diagnosis not present

## 2017-02-28 DIAGNOSIS — M6281 Muscle weakness (generalized): Secondary | ICD-10-CM | POA: Diagnosis not present

## 2017-02-28 DIAGNOSIS — R296 Repeated falls: Secondary | ICD-10-CM | POA: Diagnosis not present

## 2017-03-04 DIAGNOSIS — E119 Type 2 diabetes mellitus without complications: Secondary | ICD-10-CM | POA: Diagnosis not present

## 2017-03-04 DIAGNOSIS — R296 Repeated falls: Secondary | ICD-10-CM | POA: Diagnosis not present

## 2017-03-04 DIAGNOSIS — Z9981 Dependence on supplemental oxygen: Secondary | ICD-10-CM | POA: Diagnosis not present

## 2017-03-04 DIAGNOSIS — E785 Hyperlipidemia, unspecified: Secondary | ICD-10-CM | POA: Diagnosis not present

## 2017-03-04 DIAGNOSIS — M6281 Muscle weakness (generalized): Secondary | ICD-10-CM | POA: Diagnosis not present

## 2017-03-04 DIAGNOSIS — I10 Essential (primary) hypertension: Secondary | ICD-10-CM | POA: Diagnosis not present

## 2017-03-04 DIAGNOSIS — M1991 Primary osteoarthritis, unspecified site: Secondary | ICD-10-CM | POA: Diagnosis not present

## 2017-03-06 ENCOUNTER — Telehealth: Payer: Self-pay | Admitting: Primary Care

## 2017-03-06 NOTE — Telephone Encounter (Signed)
Message left for patient to return my call.  

## 2017-03-06 NOTE — Telephone Encounter (Signed)
-----   Message from Pleas Koch, NP sent at 02/20/2017  3:00 PM EDT ----- Regarding: BP Please check on patient's BP readings.

## 2017-03-07 DIAGNOSIS — E785 Hyperlipidemia, unspecified: Secondary | ICD-10-CM | POA: Diagnosis not present

## 2017-03-07 DIAGNOSIS — M1991 Primary osteoarthritis, unspecified site: Secondary | ICD-10-CM | POA: Diagnosis not present

## 2017-03-07 DIAGNOSIS — E119 Type 2 diabetes mellitus without complications: Secondary | ICD-10-CM | POA: Diagnosis not present

## 2017-03-07 DIAGNOSIS — R296 Repeated falls: Secondary | ICD-10-CM | POA: Diagnosis not present

## 2017-03-07 DIAGNOSIS — M6281 Muscle weakness (generalized): Secondary | ICD-10-CM | POA: Diagnosis not present

## 2017-03-07 DIAGNOSIS — I10 Essential (primary) hypertension: Secondary | ICD-10-CM | POA: Diagnosis not present

## 2017-03-07 DIAGNOSIS — Z9981 Dependence on supplemental oxygen: Secondary | ICD-10-CM | POA: Diagnosis not present

## 2017-03-11 DIAGNOSIS — E119 Type 2 diabetes mellitus without complications: Secondary | ICD-10-CM | POA: Diagnosis not present

## 2017-03-11 DIAGNOSIS — M6281 Muscle weakness (generalized): Secondary | ICD-10-CM | POA: Diagnosis not present

## 2017-03-11 DIAGNOSIS — M1991 Primary osteoarthritis, unspecified site: Secondary | ICD-10-CM | POA: Diagnosis not present

## 2017-03-11 DIAGNOSIS — Z9981 Dependence on supplemental oxygen: Secondary | ICD-10-CM | POA: Diagnosis not present

## 2017-03-11 DIAGNOSIS — E785 Hyperlipidemia, unspecified: Secondary | ICD-10-CM | POA: Diagnosis not present

## 2017-03-11 DIAGNOSIS — R296 Repeated falls: Secondary | ICD-10-CM | POA: Diagnosis not present

## 2017-03-11 DIAGNOSIS — I10 Essential (primary) hypertension: Secondary | ICD-10-CM | POA: Diagnosis not present

## 2017-03-13 NOTE — Telephone Encounter (Signed)
Spoken to patient and the last time it was check when stated the nurse who comes over to her house, a few weeks ago. It was 150/80. Patient stated that it is much better than before. When the nurse first came out, BP was 200/100.

## 2017-03-14 NOTE — Telephone Encounter (Signed)
Please have her start monitoring her blood pressure closely once daily. Have her report readings greater than 140/90 on a regular basis. I'd like to see her back in the office in 1 month for a diabetes and blood pressure check. Please schedule.

## 2017-03-14 NOTE — Telephone Encounter (Signed)
Spoken and notified patient of Kate's comments. Patient verbalized understanding.  Follow up on 04/17/2017

## 2017-03-15 DIAGNOSIS — G4730 Sleep apnea, unspecified: Secondary | ICD-10-CM | POA: Diagnosis not present

## 2017-03-18 ENCOUNTER — Other Ambulatory Visit: Payer: Self-pay | Admitting: Primary Care

## 2017-03-18 DIAGNOSIS — R809 Proteinuria, unspecified: Principal | ICD-10-CM

## 2017-03-18 DIAGNOSIS — F331 Major depressive disorder, recurrent, moderate: Secondary | ICD-10-CM

## 2017-03-18 DIAGNOSIS — N3941 Urge incontinence: Secondary | ICD-10-CM

## 2017-03-18 DIAGNOSIS — I10 Essential (primary) hypertension: Secondary | ICD-10-CM

## 2017-03-18 DIAGNOSIS — E1129 Type 2 diabetes mellitus with other diabetic kidney complication: Secondary | ICD-10-CM

## 2017-03-28 ENCOUNTER — Ambulatory Visit (INDEPENDENT_AMBULATORY_CARE_PROVIDER_SITE_OTHER): Payer: Medicare Other

## 2017-03-28 ENCOUNTER — Other Ambulatory Visit: Payer: Self-pay

## 2017-03-28 DIAGNOSIS — R011 Cardiac murmur, unspecified: Secondary | ICD-10-CM

## 2017-04-05 ENCOUNTER — Encounter: Payer: Self-pay | Admitting: *Deleted

## 2017-04-14 DIAGNOSIS — G4730 Sleep apnea, unspecified: Secondary | ICD-10-CM | POA: Diagnosis not present

## 2017-04-17 ENCOUNTER — Other Ambulatory Visit: Payer: Self-pay | Admitting: Primary Care

## 2017-04-17 ENCOUNTER — Encounter: Payer: Self-pay | Admitting: Primary Care

## 2017-04-17 ENCOUNTER — Ambulatory Visit (INDEPENDENT_AMBULATORY_CARE_PROVIDER_SITE_OTHER): Payer: Medicare Other | Admitting: Primary Care

## 2017-04-17 VITALS — BP 158/84 | HR 76 | Temp 98.4°F | Ht 68.0 in | Wt 272.0 lb

## 2017-04-17 DIAGNOSIS — I10 Essential (primary) hypertension: Secondary | ICD-10-CM | POA: Diagnosis not present

## 2017-04-17 DIAGNOSIS — R809 Proteinuria, unspecified: Principal | ICD-10-CM

## 2017-04-17 DIAGNOSIS — E118 Type 2 diabetes mellitus with unspecified complications: Secondary | ICD-10-CM

## 2017-04-17 DIAGNOSIS — R0602 Shortness of breath: Secondary | ICD-10-CM | POA: Diagnosis not present

## 2017-04-17 DIAGNOSIS — E119 Type 2 diabetes mellitus without complications: Secondary | ICD-10-CM

## 2017-04-17 DIAGNOSIS — E785 Hyperlipidemia, unspecified: Secondary | ICD-10-CM | POA: Diagnosis not present

## 2017-04-17 DIAGNOSIS — R0609 Other forms of dyspnea: Secondary | ICD-10-CM

## 2017-04-17 DIAGNOSIS — R5383 Other fatigue: Secondary | ICD-10-CM | POA: Diagnosis not present

## 2017-04-17 DIAGNOSIS — E1129 Type 2 diabetes mellitus with other diabetic kidney complication: Secondary | ICD-10-CM

## 2017-04-17 LAB — BRAIN NATRIURETIC PEPTIDE: Pro B Natriuretic peptide (BNP): 372 pg/mL — ABNORMAL HIGH (ref 0.0–100.0)

## 2017-04-17 LAB — BASIC METABOLIC PANEL
BUN: 24 mg/dL — AB (ref 6–23)
CHLORIDE: 98 meq/L (ref 96–112)
CO2: 30 mEq/L (ref 19–32)
CREATININE: 1.56 mg/dL — AB (ref 0.40–1.20)
Calcium: 9.9 mg/dL (ref 8.4–10.5)
GFR: 34.64 mL/min — ABNORMAL LOW (ref >60.00)
GLUCOSE: 217 mg/dL — AB (ref 70–99)
POTASSIUM: 4.9 meq/L (ref 3.5–5.1)
Sodium: 136 mEq/L (ref 135–145)

## 2017-04-17 LAB — HEMOGLOBIN A1C: HEMOGLOBIN A1C: 8.6 % — AB (ref 4.6–6.5)

## 2017-04-17 LAB — LIPID PANEL
CHOL/HDL RATIO: 5
Cholesterol: 232 mg/dL — ABNORMAL HIGH (ref 0–200)
HDL: 45.9 mg/dL (ref >39.00)
LDL Cholesterol: 157 mg/dL — ABNORMAL HIGH (ref 0–99)
NONHDL: 186.29
Triglycerides: 146 mg/dL (ref 0.0–149.0)
VLDL: 29.2 mg/dL (ref 0.0–40.0)

## 2017-04-17 LAB — TSH: TSH: 4.16 u[IU]/mL (ref 0.35–4.50)

## 2017-04-17 MED ORDER — LISINOPRIL-HYDROCHLOROTHIAZIDE 20-12.5 MG PO TABS: 1.0000 | ORAL_TABLET | Freq: Two times a day (BID) | ORAL | 0 refills | Status: DC

## 2017-04-17 MED ORDER — GLIMEPIRIDE 4 MG PO TABS: 8.0000 mg | ORAL_TABLET | Freq: Every day | ORAL | 1 refills | Status: DC

## 2017-04-17 NOTE — Assessment & Plan Note (Signed)
Ongoing. Check TSH today. Recommended regular activity to increase overall strength.

## 2017-04-17 NOTE — Assessment & Plan Note (Addendum)
Chronic for years, worse now. Exam today without evidence of crackles, does have aortic murmur. Echocardiogram completed in late June 2018, overall okay. Xray in January with pulmonary vascular congestion and cardiomegaly. Given symptoms and overall presentation today will check BNP. Will continue to work on BP control. Referral placed to cardiology for further work up of exertional SOB with fatigue.

## 2017-04-17 NOTE — Assessment & Plan Note (Signed)
No improvement in diet, not exercising. Check A1C today, consider increasing glimepiride or adding additional medication if needed. She declines diabetic nutritionist. Referral placed for diabetic eye exam. Managed on ACE and statin.

## 2017-04-17 NOTE — Assessment & Plan Note (Signed)
Due for repeat lipids since switching from pravastatin to atorvastatin. Lipid panel pending.

## 2017-04-17 NOTE — Patient Instructions (Signed)
Increase lisinopril/HCTZ 20/12.5 to one tablet twice daily.   Please start monitoring your blood pressure daily. Check your blood pressure daily, around the same time of day, for the next 2 weeks. Ensure that you have rested for 30 minutes prior to checking your blood pressure. Record your readings and bring them to your next visit.  Stop by the front desk and speak with either Rosaria Ferries or Shirlean Mylar regarding your referral to Cardiology.  It's important to improve your diet by reducing consumption of fast food, fried food, processed snack foods, sugary drinks. Increase consumption of fresh vegetables and fruits, whole grains, water.  Start exercising. You should be getting 150 minutes of exercise weekly.  Schedule a follow up visit in 2-3 weeks for re-evaluation of your blood pressure.  It was a pleasure to see you today!

## 2017-04-17 NOTE — Assessment & Plan Note (Signed)
Above goal today, also on home readings. Continue carvedilol 6.25 mg BID, increase to BID dosing of lisinopril/HCTZ 20/12.5 mg.  Will have her continue to monitor BP, record readings and bring them to her next visit in 2-3 weeks.

## 2017-04-17 NOTE — Progress Notes (Signed)
Subjective:    Patient ID: Megan Obrien, female    DOB: July 18, 1946, 71 y.o.   MRN: [id number]  HPI  Megan Obrien is a 71 year old female with a history of obesity, diabetes, essential hypertension who presents today for follow up.  1) Type 2 Diabetes: Currently managed on glimepiride 4 mg daily. Her last A1C was above goal at 8.2 in April 2018 which was about the same as her prior reading. She reported that she would work on diet improvements and exercise. She is due for A1C today.  She's checking her blood sugars infrequently, mostly fasting with numbers ranging 160-mid 200's. She has not seen a diabetic nutritionist and declines today. She has difficulty controlling cravings and will binge on unhealthy foods. She is not exercising due to chronic knee pain and shortness of breath.  Diet currently consists of:  Breakfast: Restaurants, pancakes, eggs, potatoes, eggs Lunch: Restaurants, potatoes  Dinner: Sandwich, chicken, rice, potato salad Snacks: Not usually Desserts: Rarely  Beverages: Un-sweet tea, diet soda, little water  Exercise: She is not exercising   Wt Readings from Last 3 Encounters:  04/17/17 272 lb (123.4 kg)  02/20/17 270 lb 12.8 oz (122.8 kg)  01/15/17 280 lb (127 kg)     2) Essential Hypertension: BP in the clinic today of 158/84. Currently managed on carvedilol 6.25 mg BID, lisinopril/HCTZ 20-12.5 mg once daily. She's checked her BP at home for the past 2-3 days, no readings prior. Her BP readings were 150-170's/80's. She's continued to feel dizzy with rest and ambulation, this has been present for years. Her last eye exam was around 2-3 years ago.   3) Hyperlipidemia: Lipid panel in April 2018 with LDL above goal, borderline TC so her Pravastatin was switched to atorvastatin. She is due for lipid panel today and is fasting.  4) Shortness of Breath: Chronic for years, gradually increased over the last several months. Chronic, dry cough throughout the day.  Continues to feel fatigued which is chronic. Uses walker for ambulation and will often have to stop every several feet and rest. Echocardiogram in June 2018 with EF of 55-60%, severe calcified annulus that were consistent with mild-moderate stenosis, moderate dilation to left atrium. Pulmonary artery pressure was unable to be measured. Wearing oxygen at night for sleep apnea. Chest xray in January with pulmonary vascular congestion and cardiomegaly.   She's working with a physical therapist on overall strength and endurance given chronic knee pain and dyspnea.   Review of Systems  Constitutional: Positive for fatigue.  HENT: Negative for congestion.   Eyes: Negative for visual disturbance.  Respiratory: Positive for cough and shortness of breath. Negative for wheezing.   Cardiovascular: Negative for chest pain and palpitations.  Musculoskeletal: Positive for arthralgias.  Neurological: Positive for dizziness. Negative for headaches.       Past Medical History:  Diagnosis Date  . Arthritis   . Depression   . GERD (gastroesophageal reflux disease)   . Hyperlipidemia   . Murmur   . Seasonal allergies   . Type 2 diabetes mellitus ([place])   . Urinary incontinence      Social History   Social History  . Marital status: Widowed    Spouse name: N/A  . Number of children: N/A  . Years of education: N/A   Occupational History  . Not on file.   Social History Main Topics  . Smoking status: Never Smoker  . Smokeless tobacco: Never Used  . Alcohol use 0.0 oz/week  Comment: rarely  . Drug use: No  . Sexual activity: Not on file   Other Topics Concern  . Not on file   Social History Narrative   Widow.   2 children, numerous grandchildren.   Retired. Once worked on a Publishing copy.   Enjoys playing computer games, spending time with family.    Past Surgical History:  Procedure Laterality Date  . BACK SURGERY    . KNEE SURGERY Left   . TONSILLECTOMY AND ADENOIDECTOMY  1951      Family History  Problem Relation Age of Onset  . Lung cancer Father   . Alzheimer's disease Mother     No Known Allergies  Current Outpatient Prescriptions on File Prior to Visit  Medication Sig Dispense Refill  . ACCU-CHEK AVIVA PLUS test strip USE TO TEST BLOOD SUGAR ONE TO TWO TIMES DAILY AND AS DIRECTED 300 each 2  . atorvastatin (LIPITOR) 40 MG tablet Take 1 tablet (40 mg total) by mouth daily. 90 tablet 1  . Blood Glucose Monitoring Suppl (ACCU-CHEK AVIVA PLUS) w/Device KIT Use as directed to check blood sugar 2 times daily. 1 kit 0  . buPROPion (WELLBUTRIN SR) 150 MG 12 hr tablet TAKE 1 TABLET BY MOUTH TWICE DAILY 180 tablet 0  . carvedilol (COREG) 6.25 MG tablet TAKE TWO TABLETS BY MOUTH TWICE DAILY WITH MEALS 360 tablet 1  . FLUoxetine (PROZAC) 40 MG capsule TAKE ONE CAPSULE BY MOUTH ONCE DAILY 90 capsule 2  . fluticasone (FLONASE) 50 MCG/ACT nasal spray Place 1 spray into both nostrils daily. 48 g 1  . glimepiride (AMARYL) 4 MG tablet TAKE 1 TABLET BY MOUTH ONCE DAILY WITH BREAKFAST 90 tablet 0  . Iron-Folic Acid-Vit E32 (IRON FORMULA PO) Take 65 mg by mouth daily.    [name] lisinopril-hydrochlorothiazide (PRINZIDE,ZESTORETIC) 20-12.5 MG tablet TAKE ONE TABLET BY MOUTH ONCE DAILY 90 tablet 2  . omeprazole (PRILOSEC) 40 MG capsule TAKE ONE CAPSULE BY MOUTH ONCE DAILY 90 capsule 1  . oxybutynin (DITROPAN) 5 MG tablet TAKE ONE TABLET BY MOUTH THREE TIMES DAILY 270 tablet 0  . triamcinolone cream (KENALOG) 0.1 % Apply 1 application topically 2 (two) times daily. 30 g 0  . VITAMIN D, CHOLECALCIFEROL, PO Take 5,000 Units by mouth daily.     No current facility-administered medications on file prior to visit.     BP (!) 158/84   Pulse 76   Temp 98.4 F (36.9 C) (Oral)   Ht _0  (1.727 m)   Wt 272 lb (123.4 kg)   SpO2 97%   BMI 41.36 kg/m  ' Objective:   Physical Exam  Constitutional: She is oriented to person, place, and time. She appears well-nourished.  Using walker in  clinic, stopping to rest every 10 feet initially. Improved when leaving clinic.  Neck: Neck supple.  Cardiovascular: Normal rate and regular rhythm.   Murmur heard. Pulmonary/Chest: Effort normal and breath sounds normal. She has no wheezes.  Neurological: She is alert and oriented to person, place, and time.  Skin: Skin is warm and dry.  Psychiatric: She has a normal mood and affect.          Assessment & Plan:

## 2017-04-18 ENCOUNTER — Encounter: Payer: Self-pay | Admitting: *Deleted

## 2017-04-19 ENCOUNTER — Ambulatory Visit (INDEPENDENT_AMBULATORY_CARE_PROVIDER_SITE_OTHER): Payer: Medicare Other | Admitting: Cardiovascular Disease

## 2017-04-19 ENCOUNTER — Encounter: Payer: Self-pay | Admitting: Cardiovascular Disease

## 2017-04-19 VITALS — BP 179/86 | HR 64 | Ht 68.0 in | Wt 273.0 lb

## 2017-04-19 DIAGNOSIS — Z7689 Persons encountering health services in other specified circumstances: Secondary | ICD-10-CM

## 2017-04-19 DIAGNOSIS — I059 Rheumatic mitral valve disease, unspecified: Secondary | ICD-10-CM | POA: Diagnosis not present

## 2017-04-19 DIAGNOSIS — I10 Essential (primary) hypertension: Secondary | ICD-10-CM | POA: Diagnosis not present

## 2017-04-19 DIAGNOSIS — Z01812 Encounter for preprocedural laboratory examination: Secondary | ICD-10-CM | POA: Diagnosis not present

## 2017-04-19 DIAGNOSIS — R0609 Other forms of dyspnea: Secondary | ICD-10-CM

## 2017-04-19 DIAGNOSIS — E785 Hyperlipidemia, unspecified: Secondary | ICD-10-CM | POA: Diagnosis not present

## 2017-04-19 NOTE — Progress Notes (Signed)
  Cardiology Office Note   Date:  04/19/2017   ID:  Megan Obrien, DOB 05/11/1946, MRN [id number]  PCP:  Clark, Katherine K, NP  Cardiologist:   Muhammad Arida, MD   Chief Complaint  Patient presents with  . other    Sob. Meds reviewed verbally with pt.      History of Present Illness: Megan Obrien is a 71 y.o. female who Was referred by Kathryn Clark, NP for evaluation of exertional dyspnea. She has known history of type 2 diabetes, obesity, hyperlipidemia, Sleep apnea and essential hypertension.  She had previous cardiac catheterization in 2013 which showed no significant coronary artery disease. Over the last few years, she has experienced progressive exertional dyspnea which is currently happening with minimal activities. Her functional capacity is very poor and she walks slowly with a walker. She reports poor balance and occasional falls. She complains of significant orthostatic dizziness. She denies any chest pain. She reports poor quality sleep as she has to wake up every 2 hours to urinate. Her blood pressure was uncontrolled recently. Blood pressure medications were adjusted but she continued to have significant exertional dyspnea.  She had an echocardiogram done last month which showed normal LV systolic function, grade 1 diastolic dysfunction, severely calcified mitral valve annulus with mild to moderate stenosis. Mean gradient was 5 mmHg with a valve area of 1.73. Left atrium was moderately to severely dilated and pulmonary pressure could not be estimated.  Recent labs showed mildly elevated BNP at 372.   Past Medical History:  Diagnosis Date  . Arthritis   . Depression   . GERD (gastroesophageal reflux disease)   . Hyperlipidemia   . Hypertension   . Murmur   . Seasonal allergies   . Type 2 diabetes mellitus ([facility])   . Urinary incontinence     Past Surgical History:  Procedure Laterality Date  . BACK SURGERY    . CARDIAC CATHETERIZATION    . KNEE  SURGERY Left   . TONSILLECTOMY AND ADENOIDECTOMY  1951     Current Outpatient Prescriptions  Medication Sig Dispense Refill  . ACCU-CHEK AVIVA PLUS test strip USE TO TEST BLOOD SUGAR ONE TO TWO TIMES DAILY AND AS DIRECTED 300 each 2  . atorvastatin (LIPITOR) 40 MG tablet Take 1 tablet (40 mg total) by mouth daily. 90 tablet 1  . Blood Glucose Monitoring Suppl (ACCU-CHEK AVIVA PLUS) w/Device KIT Use as directed to check blood sugar 2 times daily. 1 kit 0  . buPROPion (WELLBUTRIN SR) 150 MG 12 hr tablet TAKE 1 TABLET BY MOUTH TWICE DAILY 180 tablet 0  . carvedilol (COREG) 6.25 MG tablet TAKE TWO TABLETS BY MOUTH TWICE DAILY WITH MEALS 360 tablet 1  . FLUoxetine (PROZAC) 40 MG capsule TAKE ONE CAPSULE BY MOUTH ONCE DAILY 90 capsule 2  . fluticasone (FLONASE) 50 MCG/ACT nasal spray Place 1 spray into both nostrils daily. 48 g 1  . glimepiride (AMARYL) 4 MG tablet Take 2 tablets (8 mg total) by mouth daily with breakfast. 180 tablet 1  . Iron-Folic Acid-Vit B12 (IRON FORMULA PO) Take 65 mg by mouth daily.    . lisinopril-hydrochlorothiazide (PRINZIDE,ZESTORETIC) 20-12.5 MG tablet Take 1 tablet by mouth 2 (two) times daily. 180 tablet 0  . omeprazole (PRILOSEC) 40 MG capsule TAKE ONE CAPSULE BY MOUTH ONCE DAILY 90 capsule 1  . oxybutynin (DITROPAN) 5 MG tablet TAKE ONE TABLET BY MOUTH THREE TIMES DAILY 270 tablet 0  . triamcinolone cream (KENALOG) 0.1 % Apply   1 application topically 2 (two) times daily. 30 g 0  . VITAMIN D, CHOLECALCIFEROL, PO Take 5,000 Units by mouth daily.     No current facility-administered medications for this visit.     Allergies:   Patient has no known allergies.    Social History:  The patient  reports that she has never smoked. She has never used smokeless tobacco. She reports that she does not drink alcohol or use drugs.   Family History:  The patient's family history includes Alzheimer's disease in her mother; Lung cancer in her father; Stroke in her maternal  grandfather.    ROS:  Please see the history of present illness.   Otherwise, review of systems are positive for none.   All other systems are reviewed and negative.    PHYSICAL EXAM: VS:  BP (!) 179/86 (BP Location: Right Arm, Patient Position: Sitting, Cuff Size: Large)   Pulse 64   Ht 5' 8" (1.727 m)   Wt 273 lb (123.8 kg)   BMI 41.51 kg/m  , BMI Body mass index is 41.51 kg/m. GEN: Well nourished, well developed, in no acute distress  HEENT: normal  Neck: no JVD, carotid bruits, or masses Cardiac: RRR; no rubs, or gallops. 2/6 systolic ejection murmur in the aortic area which is early peaking. There is +1 edema bilaterally with tonic stasis dermatitis Respiratory:  clear to auscultation bilaterally, normal work of breathing GI: soft, nontender, nondistended, + BS MS: no deformity or atrophy  Skin: warm and dry, no rash Neuro:  Strength and sensation are intact Psych: euthymic mood, full affect   EKG:  EKG is ordered today. The ekg ordered today demonstrates normal sinus rhythm was left axis deviation and minimal LVH.   Recent Labs: 07/24/2016: ALT 7 10/10/2016: Hemoglobin 12.6; Platelets 171 04/17/2017: BUN 24; Creatinine, Ser 1.56; Potassium 4.9; Pro B Natriuretic peptide (BNP) 372.0; Sodium 136; TSH 4.16    Lipid Panel    Component Value Date/Time   CHOL 232 (H) 04/17/2017 1249   TRIG 146.0 04/17/2017 1249   HDL 45.90 04/17/2017 1249   CHOLHDL 5 04/17/2017 1249   VLDL 29.2 04/17/2017 1249   LDLCALC 157 (H) 04/17/2017 1249      Wt Readings from Last 3 Encounters:  04/19/17 273 lb (123.8 kg)  04/17/17 272 lb (123.4 kg)  02/20/17 270 lb 12.8 oz (122.8 kg)       PAD Screen 04/19/2017  Previous PAD dx? Yes  Previous surgical procedure? No  Pain with walking? Yes  Subsides with rest? Yes  Feet/toe relief with dangling? No  Painful, non-healing ulcers? No  Extremities discolored? Yes      ASSESSMENT AND PLAN:  1.  Severe exertional dyspnea: Most likely  this is multifactorial due to diastolic heart failure, extreme physical deconditioning and obesity. Nonetheless, she has multiple risk factors for coronary artery disease including prolonged history of type 2 diabetes. She requires further ischemic cardiac evaluation. Stress testing is limited by poor functional capacity and obesity and likely would be associated with significant artifacts. The echocardiogram also could not evaluate pulmonary pressure.  Given severity of her symptoms, I think the best option is to proceed with a right and left cardiac catheterization for definite diagnosis. I discussed the procedure in details as well as risks and benefits.  2. Mild to moderate mitral stenosis: The murmur she has is due to aortic sclerosis. This will be further evaluated with cardiac catheterization.  3. Essential hypertension: Blood pressure continues to be elevated. I  considered adding amlodipine. However, she complains of significant orthostatic dizziness. Thus, I made no changes for now.  4. Hyperlipidemia: Most recent LDL was 157. Given the presence of diabetes, treatment with atorvastatin is recommended to achieve a target LDL of less than 70.    Disposition:   FU with me in 1 month  Signed,  Muhammad Arida, MD  04/19/2017 12:16 PM    [facility] 

## 2017-04-19 NOTE — Patient Instructions (Addendum)
Medication Instructions:  Your physician recommends that you continue on your current medications as directed. Please refer to the Current Medication list given to you today.   Labwork: BMET, CBC, PT/INR Monday, July 23 at the [facility] outpatient lab  Testing/Procedures: Your physician has requested that you have a cardiac catheterization. Cardiac catheterization is used to diagnose and/or treat various heart conditions. Doctors may recommend this procedure for a number of different reasons. The most common reason is to evaluate chest pain. Chest pain can be a symptom of coronary artery disease (CAD), and cardiac catheterization can show whether plaque is narrowing or blocking your heart's arteries. This procedure is also used to evaluate the valves, as well as measure the blood flow and oxygen levels in different parts of your heart. For further information please visit [employer]. Please follow instruction sheet, as given.  [facility] Cardiac Cath Instructions   You are scheduled for a Cardiac Cath on Thursday, July 26  Arrival time 7:30am  Please expect a call from our [facility] to pre-register you  Do not eat/drink anything after midnight  Someone will need to drive you home  It is recommended someone be with you for the first 24 hours after your procedure  Wear clothes that are easy to get on/off and wear slip on shoes if possible   Medications bring a current list of all medications with you  _xx_ Hold lisinopril/HCTZ the morning of your procedure.  _xx__ Do not take these medications before your procedure: Do not take diabetes medications 24 hours before your procedure  Day of your procedure: Arrive at the [place] entrance.  Free valet service is available.  After entering the [place] please check-in at the registration desk (1st desk on your right) to receive your armband. After receiving your armband someone will escort you to the  cardiac cath/special procedures waiting area.  The usual length of stay after your procedure is about 2 to 3 hours.  This can vary.  If you have any questions, please call our office at [phone], or you may call the cardiac cath lab at [facility] directly at [phone]   Follow-Up: Your physician recommends that you schedule a follow-up appointment in: 3 weeks with Dr. Fletcher Anon.    Any Other Special Instructions Will Be Listed Below (If Applicable).     If you need a refill on your cardiac medications before your next appointment, please call your pharmacy.   Angiogram An angiogram, also called angiography, is a procedure used to look at the blood vessels. In this procedure, dye is injected through a long, thin tube (catheter) into an artery. X-rays are then taken. The X-rays will show if there is a blockage or problem in a blood vessel. Tell a health care provider about:  Any allergies you have, including allergies to shellfish or contrast dye.  All medicines you are taking, including vitamins, herbs, eye drops, creams, and over-the-counter medicines.  Any problems you or family members have had with anesthetic medicines.  Any blood disorders you have.  Any surgeries you have had.  Any previous kidney problems or failure you have had.  Any medical conditions you have.  Possibility of pregnancy, if this applies. What are the risks? Generally, an angiogram is a safe procedure. However, as with any procedure, problems can occur. Possible problems include:  Injury to the blood vessels, including rupture or bleeding.  Infection or bruising at the catheter site.  Allergic reaction to the dye or contrast  used.  Kidney damage from the dye or contrast used.  Blood clots that can lead to a stroke or heart attack.  What happens before the procedure?  Do not eat or drink after midnight on the night before the procedure, or as directed by your health care provider.  Ask your  health care provider if you may drink enough water to take any needed medicines the morning of the procedure. What happens during the procedure?  You may be given a medicine to help you relax (sedative) before and during the procedure. This medicine is given through an IV access tube that is inserted into one of your veins.  The area where the catheter will be inserted will be washed and shaved. This is usually done in the groin but may be done in the fold of your arm (near your elbow) or in the wrist.  A medicine will be given to numb the area where the catheter will be inserted (local anesthetic).  The catheter will be inserted with a guide wire into an artery. The catheter is guided by using a type of X-ray (fluoroscopy) to the blood vessel being examined.  Dye is then injected into the catheter, and X-rays are taken. The dye helps to show where any narrowing or blockages are located. What happens after the procedure?  If the procedure is done through the leg, you will be kept in bed lying flat for several hours. You will be instructed to not bend or cross your legs.  The insertion site will be checked frequently.  The pulse in your feet or wrist will be checked frequently.  Additional blood tests, X-rays, and electrocardiography may be done.  You may need to stay in the hospital overnight for observation. This information is not intended to replace advice given to you by your health care provider. Make sure you discuss any questions you have with your health care provider. Document Released: 06/27/2005 Document Revised: 02/29/2016 Document Reviewed: 02/18/2013 Elsevier Interactive Patient Education  2017 [employer].

## 2017-04-22 ENCOUNTER — Other Ambulatory Visit
Admission: RE | Admit: 2017-04-22 | Discharge: 2017-04-22 | Disposition: A | Discharge status: Home / Self Care | Payer: Medicare Other | Source: Ambulatory Visit | Attending: Cardiovascular Disease | Admitting: Cardiovascular Disease

## 2017-04-22 DIAGNOSIS — Z01812 Encounter for preprocedural laboratory examination: Secondary | ICD-10-CM | POA: Insufficient documentation

## 2017-04-22 LAB — BASIC METABOLIC PANEL
Anion gap: 9 (ref 5–15)
BUN: 27 mg/dL — AB (ref 6–20)
CHLORIDE: 100 mmol/L — AB (ref 101–111)
CO2: 28 mmol/L (ref 22–32)
CREATININE: 1.41 mg/dL — AB (ref 0.44–1.00)
Calcium: 9.3 mg/dL (ref 8.9–10.3)
GFR calc Af Amer: 42 mL/min — ABNORMAL LOW (ref >60)
GFR calc non Af Amer: 36 mL/min — ABNORMAL LOW (ref >60)
GLUCOSE: 178 mg/dL — AB (ref 65–99)
Potassium: 4.3 mmol/L (ref 3.5–5.1)
Sodium: 137 mmol/L (ref 135–145)

## 2017-04-22 LAB — CBC
HCT: 38.4 % (ref 35.0–47.0)
Hemoglobin: 13.5 g/dL (ref 12.0–16.0)
MCH: 31 pg (ref 26.0–34.0)
MCHC: 35.2 g/dL (ref 32.0–36.0)
MCV: 88.2 fL (ref 80.0–100.0)
PLATELETS: 159 10*3/uL (ref 150–440)
RBC: 4.35 MIL/uL (ref 3.80–5.20)
RDW: 12.6 % (ref 11.5–14.5)
WBC: 6.3 10*3/uL (ref 3.6–11.0)

## 2017-04-22 LAB — PROTIME-INR
INR: 0.97
PROTHROMBIN TIME: 12.9 s (ref 11.4–15.2)

## 2017-04-25 ENCOUNTER — Encounter: Payer: Self-pay | Admitting: Cardiovascular Disease

## 2017-04-25 ENCOUNTER — Telehealth: Payer: Self-pay | Admitting: *Deleted

## 2017-04-25 ENCOUNTER — Ambulatory Visit
Admission: RE | Admit: 2017-04-25 | Discharge: 2017-04-25 | Disposition: A | Discharge status: Home / Self Care | Payer: Medicare Other | Source: Ambulatory Visit | Attending: Cardiovascular Disease | Admitting: Cardiovascular Disease

## 2017-04-25 ENCOUNTER — Encounter
Admission: RE | Disposition: A | Discharge status: Home / Self Care | Payer: Self-pay | Source: Ambulatory Visit | Attending: Cardiovascular Disease

## 2017-04-25 DIAGNOSIS — I059 Rheumatic mitral valve disease, unspecified: Secondary | ICD-10-CM

## 2017-04-25 DIAGNOSIS — R0609 Other forms of dyspnea: Secondary | ICD-10-CM | POA: Diagnosis not present

## 2017-04-25 DIAGNOSIS — K219 Gastro-esophageal reflux disease without esophagitis: Secondary | ICD-10-CM | POA: Diagnosis not present

## 2017-04-25 DIAGNOSIS — G4730 Sleep apnea, unspecified: Secondary | ICD-10-CM | POA: Insufficient documentation

## 2017-04-25 DIAGNOSIS — I130 Hypertensive heart and chronic kidney disease with heart failure and stage 1 through stage 4 chronic kidney disease, or unspecified chronic kidney disease: Secondary | ICD-10-CM | POA: Insufficient documentation

## 2017-04-25 DIAGNOSIS — I5030 Unspecified diastolic (congestive) heart failure: Secondary | ICD-10-CM | POA: Diagnosis not present

## 2017-04-25 DIAGNOSIS — I2510 Atherosclerotic heart disease of native coronary artery without angina pectoris: Secondary | ICD-10-CM | POA: Insufficient documentation

## 2017-04-25 DIAGNOSIS — I10 Essential (primary) hypertension: Secondary | ICD-10-CM

## 2017-04-25 DIAGNOSIS — I342 Nonrheumatic mitral (valve) stenosis: Secondary | ICD-10-CM | POA: Diagnosis not present

## 2017-04-25 DIAGNOSIS — E785 Hyperlipidemia, unspecified: Secondary | ICD-10-CM | POA: Insufficient documentation

## 2017-04-25 DIAGNOSIS — Z6841 Body Mass Index (BMI) 40.0 and over, adult: Secondary | ICD-10-CM | POA: Diagnosis not present

## 2017-04-25 DIAGNOSIS — E1122 Type 2 diabetes mellitus with diabetic chronic kidney disease: Secondary | ICD-10-CM | POA: Diagnosis not present

## 2017-04-25 DIAGNOSIS — Z79899 Other long term (current) drug therapy: Secondary | ICD-10-CM

## 2017-04-25 DIAGNOSIS — Z7951 Long term (current) use of inhaled steroids: Secondary | ICD-10-CM | POA: Diagnosis not present

## 2017-04-25 DIAGNOSIS — F329 Major depressive disorder, single episode, unspecified: Secondary | ICD-10-CM | POA: Insufficient documentation

## 2017-04-25 DIAGNOSIS — E669 Obesity, unspecified: Secondary | ICD-10-CM | POA: Diagnosis not present

## 2017-04-25 DIAGNOSIS — I272 Pulmonary hypertension, unspecified: Secondary | ICD-10-CM | POA: Diagnosis not present

## 2017-04-25 DIAGNOSIS — Z7984 Long term (current) use of oral hypoglycemic drugs: Secondary | ICD-10-CM | POA: Diagnosis not present

## 2017-04-25 DIAGNOSIS — N189 Chronic kidney disease, unspecified: Secondary | ICD-10-CM | POA: Diagnosis not present

## 2017-04-25 DIAGNOSIS — M1990 Unspecified osteoarthritis, unspecified site: Secondary | ICD-10-CM | POA: Diagnosis not present

## 2017-04-25 HISTORY — PX: RIGHT/LEFT HEART CATH AND CORONARY ANGIOGRAPHY: CATH118266

## 2017-04-25 LAB — GLUCOSE, CAPILLARY: Glucose-Capillary: 212 mg/dL — ABNORMAL HIGH (ref 65–99)

## 2017-04-25 SURGERY — RIGHT/LEFT HEART CATH AND CORONARY ANGIOGRAPHY
Anesthesia: Moderate Sedation | Laterality: Bilateral

## 2017-04-25 MED ORDER — CARVEDILOL 12.5 MG PO TABS: 12.5000 mg | ORAL_TABLET | Freq: Two times a day (BID) | ORAL | 3 refills | Status: DC

## 2017-04-25 MED ORDER — FUROSEMIDE 40 MG PO TABS: 40.0000 mg | ORAL_TABLET | Freq: Every day | ORAL | 3 refills | Status: DC

## 2017-04-25 MED ORDER — LISINOPRIL 10 MG PO TABS
20.0000 mg | ORAL_TABLET | Freq: Two times a day (BID) | ORAL | Status: DC
  Administered 2017-04-25: 20 mg via ORAL

## 2017-04-25 MED ORDER — FENTANYL CITRATE (PF) 100 MCG/2ML IJ SOLN
INTRAMUSCULAR | Status: DC | PRN
  Administered 2017-04-25: 25 ug via INTRAVENOUS

## 2017-04-25 MED ORDER — ASPIRIN 81 MG PO CHEW: 81.0000 mg | CHEWABLE_TABLET | ORAL | Status: DC

## 2017-04-25 MED ORDER — FENTANYL CITRATE (PF) 100 MCG/2ML IJ SOLN
INTRAMUSCULAR | Status: AC
  Filled 2017-04-25: qty 2

## 2017-04-25 MED ORDER — SODIUM CHLORIDE 0.9% FLUSH: 3.0000 mL | INTRAVENOUS | Status: DC | PRN

## 2017-04-25 MED ORDER — MIDAZOLAM HCL 2 MG/2ML IJ SOLN
INTRAMUSCULAR | Status: DC | PRN
  Administered 2017-04-25: 1 mg via INTRAVENOUS

## 2017-04-25 MED ORDER — CARVEDILOL 12.5 MG PO TABS
12.5000 mg | ORAL_TABLET | Freq: Once | ORAL | Status: AC
  Administered 2017-04-25: 12.5 mg via ORAL
  Filled 2017-04-25: qty 1

## 2017-04-25 MED ORDER — HEPARIN SODIUM (PORCINE) 1000 UNIT/ML IJ SOLN
INTRAMUSCULAR | Status: DC | PRN
  Administered 2017-04-25: 3000 [IU] via INTRAVENOUS
  Administered 2017-04-25: 2000 [IU] via INTRAVENOUS

## 2017-04-25 MED ORDER — HYDROCHLOROTHIAZIDE 12.5 MG PO CAPS
12.5000 mg | ORAL_CAPSULE | Freq: Two times a day (BID) | ORAL | Status: DC
  Administered 2017-04-25: 12.5 mg via ORAL
  Filled 2017-04-25 (×3): qty 1

## 2017-04-25 MED ORDER — HEPARIN SODIUM (PORCINE) 1000 UNIT/ML IJ SOLN
INTRAMUSCULAR | Status: AC
  Filled 2017-04-25: qty 1

## 2017-04-25 MED ORDER — LISINOPRIL-HYDROCHLOROTHIAZIDE 20-12.5 MG PO TABS: 2.0000 | ORAL_TABLET | Freq: Two times a day (BID) | ORAL | Status: DC

## 2017-04-25 MED ORDER — LABETALOL HCL 5 MG/ML IV SOLN
INTRAVENOUS | Status: AC
  Filled 2017-04-25: qty 4

## 2017-04-25 MED ORDER — MIDAZOLAM HCL 2 MG/2ML IJ SOLN
INTRAMUSCULAR | Status: AC
  Filled 2017-04-25: qty 2

## 2017-04-25 MED ORDER — LISINOPRIL 10 MG PO TABS
ORAL_TABLET | ORAL | Status: AC
  Administered 2017-04-25: 20 mg via ORAL
  Filled 2017-04-25: qty 2

## 2017-04-25 MED ORDER — SODIUM CHLORIDE 0.9 % IV SOLN: 250.0000 mL | INTRAVENOUS | Status: DC | PRN

## 2017-04-25 MED ORDER — LABETALOL HCL 5 MG/ML IV SOLN
INTRAVENOUS | Status: DC | PRN
  Administered 2017-04-25: 20 mg via INTRAVENOUS

## 2017-04-25 MED ORDER — VERAPAMIL HCL 2.5 MG/ML IV SOLN
INTRAVENOUS | Status: AC
  Filled 2017-04-25: qty 2

## 2017-04-25 MED ORDER — SODIUM CHLORIDE 0.9% FLUSH: 3.0000 mL | Freq: Two times a day (BID) | INTRAVENOUS | Status: DC

## 2017-04-25 MED ORDER — IOPAMIDOL (ISOVUE-300) INJECTION 61%
INTRAVENOUS | Status: DC | PRN
  Administered 2017-04-25: 55 mL via INTRA_ARTERIAL

## 2017-04-25 MED ORDER — SODIUM CHLORIDE 0.9 % IV SOLN
INTRAVENOUS | Status: DC
Start: 2017-04-25 — End: 2017-04-25
  Administered 2017-04-25: 08:00:00 via INTRAVENOUS

## 2017-04-25 SURGICAL SUPPLY — 12 items
CATH 5FR JR4 DIAGNOSTIC (CATHETERS) ×2 IMPLANT
CATH BALLN WEDGE 5F 110CM (CATHETERS) ×2 IMPLANT
CATH INFINITI 5FR ANG PIGTAIL (CATHETERS) ×2 IMPLANT
CATH OPTITORQUE JACKY 4.0 5F (CATHETERS) ×2 IMPLANT
DEVICE RAD TR BAND REGULAR (VASCULAR PRODUCTS) ×2 IMPLANT
GLIDESHEATH SLEND SS 6F .021 (SHEATH) ×4 IMPLANT
GUIDEWIRE .025 260CM (WIRE) ×2 IMPLANT
KIT MANI 3VAL PERCEP (MISCELLANEOUS) ×3 IMPLANT
KIT RIGHT HEART (MISCELLANEOUS) ×2 IMPLANT
PACK CARDIAC CATH (CUSTOM PROCEDURE TRAY) ×3 IMPLANT
WIRE HITORQ VERSACORE ST 145CM (WIRE) ×2 IMPLANT
WIRE ROSEN-J .035X260CM (WIRE) ×2 IMPLANT

## 2017-04-25 NOTE — H&P (View-Only) (Signed)
Cardiology Office Note   Date:  04/19/2017   ID:  Megan Obrien, DOB [birth year], MRN [id number]  PCP:  Pleas Koch, NP  Cardiologist:   [name], MD   Chief Complaint  Patient presents with  . other    Sob. Meds reviewed verbally with pt.      History of Present Illness: Megan Obrien is a 71 y.o. female who Was referred by Jerrel Ivory, NP for evaluation of exertional dyspnea. She has known history of type 2 diabetes, obesity, hyperlipidemia, Sleep apnea and essential hypertension.  She had previous cardiac catheterization in 2013 which showed no significant coronary artery disease. Over the last few years, she has experienced progressive exertional dyspnea which is currently happening with minimal activities. Her functional capacity is very poor and she walks slowly with a walker. She reports poor balance and occasional falls. She complains of significant orthostatic dizziness. She denies any chest pain. She reports poor quality sleep as she has to wake up every 2 hours to urinate. Her blood pressure was uncontrolled recently. Blood pressure medications were adjusted but she continued to have significant exertional dyspnea.  She had an echocardiogram done last month which showed normal LV systolic function, grade 1 diastolic dysfunction, severely calcified mitral valve annulus with mild to moderate stenosis. Mean gradient was 5 mmHg with a valve area of 1.73. Left atrium was moderately to severely dilated and pulmonary pressure could not be estimated.  Recent labs showed mildly elevated BNP at 372.   Past Medical History:  Diagnosis Date  . Arthritis   . Depression   . GERD (gastroesophageal reflux disease)   . Hyperlipidemia   . Hypertension   . Murmur   . Seasonal allergies   . Type 2 diabetes mellitus ([place])   . Urinary incontinence     Past Surgical History:  Procedure Laterality Date  . BACK SURGERY    . CARDIAC CATHETERIZATION    . KNEE  SURGERY Left   . TONSILLECTOMY AND ADENOIDECTOMY  1951     Current Outpatient Prescriptions  Medication Sig Dispense Refill  . ACCU-CHEK AVIVA PLUS test strip USE TO TEST BLOOD SUGAR ONE TO TWO TIMES DAILY AND AS DIRECTED 300 each 2  . atorvastatin (LIPITOR) 40 MG tablet Take 1 tablet (40 mg total) by mouth daily. 90 tablet 1  . Blood Glucose Monitoring Suppl (ACCU-CHEK AVIVA PLUS) w/Device KIT Use as directed to check blood sugar 2 times daily. 1 kit 0  . buPROPion (WELLBUTRIN SR) 150 MG 12 hr tablet TAKE 1 TABLET BY MOUTH TWICE DAILY 180 tablet 0  . carvedilol (COREG) 6.25 MG tablet TAKE TWO TABLETS BY MOUTH TWICE DAILY WITH MEALS 360 tablet 1  . FLUoxetine (PROZAC) 40 MG capsule TAKE ONE CAPSULE BY MOUTH ONCE DAILY 90 capsule 2  . fluticasone (FLONASE) 50 MCG/ACT nasal spray Place 1 spray into both nostrils daily. 48 g 1  . glimepiride (AMARYL) 4 MG tablet Take 2 tablets (8 mg total) by mouth daily with breakfast. 180 tablet 1  . Iron-Folic Acid-Vit M54 (IRON FORMULA PO) Take 65 mg by mouth daily.    [name] lisinopril-hydrochlorothiazide (PRINZIDE,ZESTORETIC) 20-12.5 MG tablet Take 1 tablet by mouth 2 (two) times daily. 180 tablet 0  . omeprazole (PRILOSEC) 40 MG capsule TAKE ONE CAPSULE BY MOUTH ONCE DAILY 90 capsule 1  . oxybutynin (DITROPAN) 5 MG tablet TAKE ONE TABLET BY MOUTH THREE TIMES DAILY 270 tablet 0  . triamcinolone cream (KENALOG) 0.1 % Apply  1 application topically 2 (two) times daily. 30 g 0  . VITAMIN D, CHOLECALCIFEROL, PO Take 5,000 Units by mouth daily.     No current facility-administered medications for this visit.     Allergies:   Patient has no known allergies.    Social History:  The patient  reports that she has never smoked. She has never used smokeless tobacco. She reports that she does not drink alcohol or use drugs.   Family History:  The patient's family history includes Alzheimer's disease in her mother; Lung cancer in her father; Stroke in her maternal  grandfather.    ROS:  Please see the history of present illness.   Otherwise, review of systems are positive for none.   All other systems are reviewed and negative.    PHYSICAL EXAM: VS:  BP (!) 179/86 (BP Location: Right Arm, Patient Position: Sitting, Cuff Size: Large)   Pulse 64   Ht 5' 8" (1.727 m)   Wt 273 lb (123.8 kg)   BMI 41.51 kg/m  , BMI Body mass index is 41.51 kg/m. GEN: Well nourished, well developed, in no acute distress  HEENT: normal  Neck: no JVD, carotid bruits, or masses Cardiac: RRR; no rubs, or gallops. 2/6 systolic ejection murmur in the aortic area which is early peaking. There is +1 edema bilaterally with tonic stasis dermatitis Respiratory:  clear to auscultation bilaterally, normal work of breathing GI: soft, nontender, nondistended, + BS MS: no deformity or atrophy  Skin: warm and dry, no rash Neuro:  Strength and sensation are intact Psych: euthymic mood, full affect   EKG:  EKG is ordered today. The ekg ordered today demonstrates normal sinus rhythm was left axis deviation and minimal LVH.   Recent Labs: 07/24/2016: ALT 7 10/10/2016: Hemoglobin 12.6; Platelets 171 04/17/2017: BUN 24; Creatinine, Ser 1.56; Potassium 4.9; Pro B Natriuretic peptide (BNP) 372.0; Sodium 136; TSH 4.16    Lipid Panel    Component Value Date/Time   CHOL 232 (H) 04/17/2017 1249   TRIG 146.0 04/17/2017 1249   HDL 45.90 04/17/2017 1249   CHOLHDL 5 04/17/2017 1249   VLDL 29.2 04/17/2017 1249   LDLCALC 157 (H) 04/17/2017 1249      Wt Readings from Last 3 Encounters:  04/19/17 273 lb (123.8 kg)  04/17/17 272 lb (123.4 kg)  02/20/17 270 lb 12.8 oz (122.8 kg)       PAD Screen 04/19/2017  Previous PAD dx? Yes  Previous surgical procedure? No  Pain with walking? Yes  Subsides with rest? Yes  Feet/toe relief with dangling? No  Painful, non-healing ulcers? No  Extremities discolored? Yes      ASSESSMENT AND PLAN:  1.  Severe exertional dyspnea: Most likely  this is multifactorial due to diastolic heart failure, extreme physical deconditioning and obesity. Nonetheless, she has multiple risk factors for coronary artery disease including prolonged history of type 2 diabetes. She requires further ischemic cardiac evaluation. Stress testing is limited by poor functional capacity and obesity and likely would be associated with significant artifacts. The echocardiogram also could not evaluate pulmonary pressure.  Given severity of her symptoms, I think the best option is to proceed with a right and left cardiac catheterization for definite diagnosis. I discussed the procedure in details as well as risks and benefits.  2. Mild to moderate mitral stenosis: The murmur she has is due to aortic sclerosis. This will be further evaluated with cardiac catheterization.  3. Essential hypertension: Blood pressure continues to be elevated. I  considered adding amlodipine. However, she complains of significant orthostatic dizziness. Thus, I made no changes for now.  4. Hyperlipidemia: Most recent LDL was 157. Given the presence of diabetes, treatment with atorvastatin is recommended to achieve a target LDL of less than 70.    Disposition:   FU with me in 1 month  Signed,  [name], MD  04/19/2017 12:16 PM    [place]

## 2017-04-25 NOTE — Telephone Encounter (Signed)
S/w patient. Instructions given verbally to patient. Then she gave me permission to speak with her friend who wrote down the medications and how to take them. She verbalized understanding to get labwork on 05/02/17 at the [facility] as well. BMP order entered and Rx for furosemide and carvedilol sent to pharmacy.

## 2017-04-25 NOTE — Discharge Instructions (Signed)
Moderate Conscious Sedation, Adult, Care After These instructions provide you with information about caring for yourself after your procedure. Your health care provider may also give you more specific instructions. Your treatment has been planned according to current medical practices, but problems sometimes occur. Call your health care provider if you have any problems or questions after your procedure. What can I expect after the procedure? After your procedure, it is common:  To feel sleepy for several hours.  To feel clumsy and have poor balance for several hours.  To have poor judgment for several hours.  To vomit if you eat too soon.  Follow these instructions at home: For at least 24 hours after the procedure:   Do not: ? Participate in activities where you could fall or become injured. ? Drive. ? Use heavy machinery. ? Drink alcohol. ? Take sleeping pills or medicines that cause drowsiness. ? Make important decisions or sign legal documents. ? Take care of children on your own.  Rest. Eating and drinking  Follow the diet recommended by your health care provider.  If you vomit: ? Drink water, juice, or soup when you can drink without vomiting. ? Make sure you have little or no nausea before eating solid foods. General instructions  Have a responsible adult stay with you until you are awake and alert.  Take over-the-counter and prescription medicines only as told by your health care provider.  If you smoke, do not smoke without supervision.  Keep all follow-up visits as told by your health care provider. This is important. Contact a health care provider if:  You keep feeling nauseous or you keep vomiting.  You feel light-headed.  You develop a rash.  You have a fever. Get help right away if:  You have trouble breathing. This information is not intended to replace advice given to you by your health care provider. Make sure you discuss any questions you have  with your health care provider. Document Released: 07/08/2013 Document Revised: 02/20/2016 Document Reviewed: 01/07/2016 Elsevier Interactive Patient Education  2018 [employer] Refer to this sheet in the next few weeks. These instructions provide you with information about caring for yourself after your procedure. Your health care provider may also give you more specific instructions. Your treatment has been planned according to current medical practices, but problems sometimes occur. Call your health care provider if you have any problems or questions after your procedure. What can I expect after the procedure? After your procedure, it is typical to have the following:  Bruising at the radial site that usually fades within 1-2 weeks.  Blood collecting in the tissue (hematoma) that may be painful to the touch. It should usually decrease in size and tenderness within 1-2 weeks.  Follow these instructions at home:  Take medicines only as directed by your health care provider.  You may shower 24-48 hours after the procedure or as directed by your health care provider. Remove the bandage (dressing) and gently wash the site with plain soap and water. Pat the area dry with a clean towel. Do not rub the site, because this may cause bleeding.  Do not take baths, swim, or use a hot tub until your health care provider approves.  Check your insertion site every day for redness, swelling, or drainage.  Do not apply powder or lotion to the site.  Do not flex or bend the affected arm for 24 hours or as directed by your health care provider.  Do not  push or pull heavy objects with the affected arm for 24 hours or as directed by your health care provider.  Do not lift over 10 lb (4.5 kg) for 5 days after your procedure or as directed by your health care provider.  Ask your health care provider when it is okay to: ? Return to work or school. ? Resume usual physical activities or  sports. ? Resume sexual activity.  Do not drive home if you are discharged the same day as the procedure. Have someone else drive you.  You may drive 24 hours after the procedure unless otherwise instructed by your health care provider.  Do not operate machinery or power tools for 24 hours after the procedure.  If your procedure was done as an outpatient procedure, which means that you went home the same day as your procedure, a responsible adult should be with you for the first 24 hours after you arrive home.  Keep all follow-up visits as directed by your health care provider. This is important. Contact a health care provider if:  You have a fever.  You have chills.  You have increased bleeding from the radial site. Hold pressure on the site. Get help right away if:  You have unusual pain at the radial site.  You have redness, warmth, or swelling at the radial site.  You have drainage (other than a small amount of blood on the dressing) from the radial site.  The radial site is bleeding, and the bleeding does not stop after 30 minutes of holding steady pressure on the site.  Your arm or hand becomes pale, cool, tingly, or numb. This information is not intended to replace advice given to you by your health care provider. Make sure you discuss any questions you have with your health care provider. Document Released: 10/20/2010 Document Revised: 02/23/2016 Document Reviewed: 04/05/2014 Elsevier Interactive Patient Education  2018 [employer].

## 2017-04-25 NOTE — Interval H&P Note (Signed)
History and Physical Interval Note:  04/25/2017 8:50 AM  Megan Obrien  has presented today for surgery, with the diagnosis of LT and RT Cath   Exertional dyspnea  Mitral valve disease  The various methods of treatment have been discussed with the patient and family. After consideration of risks, benefits and other options for treatment, the patient has consented to  Procedure(s): Right/Left Heart Cath and Coronary Angiography (Bilateral) as a surgical intervention .  The patient's history has been reviewed, patient examined, no change in status, stable for surgery.  I have reviewed the patient's chart and labs.  Questions were answered to the patient's satisfaction.     [name]

## 2017-04-25 NOTE — Telephone Encounter (Signed)
-----   Message from Wellington Hampshire, MD sent at 04/25/2017  2:05 PM EDT ----- She had cardiac cath done today and blood pressure was extremely high. Increased carvedilol to 12.5 mg twice daily and add furosemide 40 mg once daily. Check basic metabolic profile in one week.

## 2017-05-09 ENCOUNTER — Telehealth: Payer: Self-pay | Admitting: *Deleted

## 2017-05-09 NOTE — Telephone Encounter (Signed)
Patient needed BMP end of last week and forgot to have it done. Patient has appt with Dr Fletcher Anon tomorrow at 2 pm. She will go to [facility] tomorrow prior to appointment for the labs.

## 2017-05-10 ENCOUNTER — Encounter: Payer: Self-pay | Admitting: Cardiovascular Disease

## 2017-05-10 ENCOUNTER — Ambulatory Visit (INDEPENDENT_AMBULATORY_CARE_PROVIDER_SITE_OTHER): Payer: Medicare Other | Admitting: Cardiovascular Disease

## 2017-05-10 ENCOUNTER — Other Ambulatory Visit
Admission: RE | Admit: 2017-05-10 | Discharge: 2017-05-10 | Disposition: A | Discharge status: Home / Self Care | Payer: Medicare Other | Source: Ambulatory Visit | Attending: Cardiovascular Disease | Admitting: Cardiovascular Disease

## 2017-05-10 VITALS — BP 142/76 | HR 58 | Ht 66.0 in | Wt 270.0 lb

## 2017-05-10 DIAGNOSIS — G4730 Sleep apnea, unspecified: Secondary | ICD-10-CM | POA: Diagnosis not present

## 2017-05-10 DIAGNOSIS — I059 Rheumatic mitral valve disease, unspecified: Secondary | ICD-10-CM

## 2017-05-10 DIAGNOSIS — I10 Essential (primary) hypertension: Secondary | ICD-10-CM | POA: Diagnosis not present

## 2017-05-10 DIAGNOSIS — I5032 Chronic diastolic (congestive) heart failure: Secondary | ICD-10-CM | POA: Diagnosis not present

## 2017-05-10 DIAGNOSIS — Z79899 Other long term (current) drug therapy: Secondary | ICD-10-CM | POA: Diagnosis not present

## 2017-05-10 DIAGNOSIS — I272 Pulmonary hypertension, unspecified: Secondary | ICD-10-CM | POA: Diagnosis not present

## 2017-05-10 LAB — BASIC METABOLIC PANEL
ANION GAP: 9 (ref 5–15)
BUN: 36 mg/dL — AB (ref 6–20)
CO2: 32 mmol/L (ref 22–32)
Calcium: 9.2 mg/dL (ref 8.9–10.3)
Chloride: 96 mmol/L — ABNORMAL LOW (ref 101–111)
Creatinine, Ser: 1.82 mg/dL — ABNORMAL HIGH (ref 0.44–1.00)
GFR, EST AFRICAN AMERICAN: 31 mL/min — AB (ref >60)
GFR, EST NON AFRICAN AMERICAN: 27 mL/min — AB (ref >60)
GLUCOSE: 265 mg/dL — AB (ref 65–99)
POTASSIUM: 4.2 mmol/L (ref 3.5–5.1)
SODIUM: 137 mmol/L (ref 135–145)

## 2017-05-10 MED ORDER — LISINOPRIL 40 MG PO TABS: 40.0000 mg | ORAL_TABLET | Freq: Every day | ORAL | 3 refills | Status: DC

## 2017-05-10 NOTE — Patient Instructions (Signed)
Medication Instructions:  Your physician has recommended you make the following change in your medication:  STOP taking lisinopril-HCTZ START taking lisinopril 40mg  once daily   Labwork: BMET in one week at the [facility] lab. No appointment needed  Testing/Procedures: none  Follow-Up: Your physician recommends that you schedule a follow-up appointment in: 1 month with Ignacia Bayley, NP, or [name], PA-C.   Any Other Special Instructions Will Be Listed Below (If Applicable). Referral to Dr. Simonne Maffucci [address] [phone]     If you need a refill on your cardiac medications before your next appointment, please call your pharmacy.

## 2017-05-10 NOTE — Progress Notes (Signed)
Cardiology Office Note   Date:  05/10/2017   ID:  Megan Obrien, DOB 1946-04-14, MRN [id number]  PCP:  Pleas Koch, NP  Cardiologist:   [name], MD   Chief Complaint  Patient presents with  . other    3 month follow up post cath. Patient c/o SOB and swelling in ankles. Meds reviewed verbally with patient.      History of Present Illness: Megan Obrien is a 71 y.o. female who Is here today for a follow-up visit regarding chronic diastolic heart failure, pulmonary hypertension and mitral stenosis. She has known history of type 2 diabetes, morbid obesity, hyperlipidemia, Sleep apnea and essential hypertension.  She was seen recently by me for dyspnea with minimal activities. Her functional capacity is very poor and she walks slowly with a walker. She had an echocardiogram done in June which showed normal LV systolic function, grade 1 diastolic dysfunction, severely calcified mitral valve annulus with mild to moderate stenosis. Mean gradient was 5 mmHg with a valve area of 1.73. Left atrium was moderately to severely dilated and pulmonary pressure could not be estimated.  Recent labs showed mildly elevated BNP at 372.  I proceeded with a right and left cardiac catheterization which showed mild nonobstructive coronary artery disease. Right heart catheterization showed moderately elevated filling pressures, severe pulmonary hypertension and normal cardiac output. Mean PA pressure was 59 mmHg and pulmonary capillary wedge pressure was 31 mmHg with an LVEDP of 19 mmHg. Mitral stenosis was mild with a mean gradient of 8 mmHg and valve area of 2.02 cm.  I started the patient on furosemide 40 mg once daily. There has been mild worsening of renal function with creatinine of 1.86 today. Her creatinine was 1.56 about 3 weeks ago. She continues to have significant dyspnea. She reports known history of sleep apnea but she does not use a CPAP. She denies any chest pain. She  continues to have orthostatic dizziness.   Past Medical History:  Diagnosis Date  . Arthritis   . Depression   . GERD (gastroesophageal reflux disease)   . Hyperlipidemia   . Hypertension   . Murmur   . Seasonal allergies   . Type 2 diabetes mellitus ([place])   . Urinary incontinence     Past Surgical History:  Procedure Laterality Date  . BACK SURGERY    . CARDIAC CATHETERIZATION    . KNEE SURGERY Left   . RIGHT/LEFT HEART CATH AND CORONARY ANGIOGRAPHY Bilateral 04/25/2017   Procedure: Right/Left Heart Cath and Coronary Angiography;  Surgeon: Wellington Hampshire, MD;  Location: [facility];  Service: Cardiovascular;  Laterality: Bilateral;  . TONSILLECTOMY AND ADENOIDECTOMY  1951     Current Outpatient Prescriptions  Medication Sig Dispense Refill  . ACCU-CHEK AVIVA PLUS test strip USE TO TEST BLOOD SUGAR ONE TO TWO TIMES DAILY AND AS DIRECTED 300 each 2  . acetaminophen (TYLENOL) 650 MG CR tablet Take 1,300-1,950 mg by mouth daily as needed for pain.    [name] atorvastatin (LIPITOR) 40 MG tablet Take 1 tablet (40 mg total) by mouth daily. 90 tablet 1  . Blood Glucose Monitoring Suppl (ACCU-CHEK AVIVA PLUS) w/Device KIT Use as directed to check blood sugar 2 times daily. 1 kit 0  . buPROPion (WELLBUTRIN SR) 150 MG 12 hr tablet TAKE 1 TABLET BY MOUTH TWICE DAILY 180 tablet 0  . carvedilol (COREG) 12.5 MG tablet Take 1 tablet (12.5 mg total) by mouth 2 (two) times daily. [place]  tablet 3  . Cholecalciferol (VITAMIN D3) 5000 units CAPS Take 5,000 Units by mouth daily.    . ferrous sulfate 325 (65 FE) MG tablet Take 325 mg by mouth daily.    [name] FLUoxetine (PROZAC) 40 MG capsule TAKE ONE CAPSULE BY MOUTH ONCE DAILY 90 capsule 2  . fluticasone (FLONASE) 50 MCG/ACT nasal spray Place 1 spray into both nostrils daily. (Patient taking differently: Place 2 sprays into both nostrils daily. ) 48 g 1  . furosemide (LASIX) 40 MG tablet Take 1 tablet (40 mg total) by mouth daily. 90 tablet 3  .  glimepiride (AMARYL) 4 MG tablet Take 2 tablets (8 mg total) by mouth daily with breakfast. 180 tablet 1  . lisinopril-hydrochlorothiazide (PRINZIDE,ZESTORETIC) 20-12.5 MG tablet Take 1 tablet by mouth 2 (two) times daily. (Patient taking differently: Take 2 tablets by mouth 2 (two) times daily. ) 180 tablet 0  . Melatonin 10 MG TABS Take 10 mg by mouth at bedtime.    . Omega-3 1000 MG CAPS Take 1,000 mg by mouth daily.    [name] omeprazole (PRILOSEC) 40 MG capsule TAKE ONE CAPSULE BY MOUTH ONCE DAILY 90 capsule 1  . oxybutynin (DITROPAN) 5 MG tablet TAKE ONE TABLET BY MOUTH THREE TIMES DAILY 270 tablet 0  . triamcinolone cream (KENALOG) 0.1 % Apply 1 application topically 2 (two) times daily. (Patient taking differently: Apply 1 application topically daily as needed (rash). ) 30 g 0   No current facility-administered medications for this visit.     Allergies:   Patient has no known allergies.    Social History:  The patient  reports that she has never smoked. She has never used smokeless tobacco. She reports that she does not drink alcohol or use drugs.   Family History:  The patient's family history includes Alzheimer's disease in her mother; Lung cancer in her father; Stroke in her maternal grandfather.    ROS:  Please see the history of present illness.   Otherwise, review of systems are positive for none.   All other systems are reviewed and negative.    PHYSICAL EXAM: VS:  BP (!) 142/76 (BP Location: Right Arm, Patient Position: Sitting, Cuff Size: Normal)   Pulse (!) 58   Ht '5\' 6"'$  (1.676 m)   Wt 270 lb (122.5 kg)   BMI 43.58 kg/m  , BMI Body mass index is 43.58 kg/m. GEN: Well nourished, well developed, in no acute distress  HEENT: normal  Neck: no JVD, carotid bruits, or masses Cardiac: RRR; no rubs, or gallops. 2/6 systolic ejection murmur in the aortic area which is early peaking. There is Trace edema bilaterally with tonic stasis dermatitis Respiratory:  clear to auscultation  bilaterally, normal work of breathing GI: soft, nontender, nondistended, + BS MS: no deformity or atrophy  Skin: warm and dry, no rash Neuro:  Strength and sensation are intact Psych: euthymic mood, full affect Right radial pulse is normal with no hematoma.  EKG:  EKG is ordered today. The ekg ordered today demonstrates sinus bradycardia with left axis deviation and minimal LVH.   Recent Labs: 07/24/2016: ALT 7 04/17/2017: Pro B Natriuretic peptide (BNP) 372.0; TSH 4.16 04/22/2017: Hemoglobin 13.5; Platelets 159 05/10/2017: BUN 36; Creatinine, Ser 1.82; Potassium 4.2; Sodium 137    Lipid Panel    Component Value Date/Time   CHOL 232 (H) 04/17/2017 1249   TRIG 146.0 04/17/2017 1249   HDL 45.90 04/17/2017 1249   CHOLHDL 5 04/17/2017 1249   VLDL 29.2 04/17/2017 1249  LDLCALC 157 (H) 04/17/2017 1249      Wt Readings from Last 3 Encounters:  05/10/17 270 lb (122.5 kg)  04/25/17 273 lb (123.8 kg)  04/19/17 273 lb (123.8 kg)       PAD Screen 04/19/2017  Previous PAD dx? Yes  Previous surgical procedure? No  Pain with walking? Yes  Subsides with rest? Yes  Feet/toe relief with dangling? No  Painful, non-healing ulcers? No  Extremities discolored? Yes      ASSESSMENT AND PLAN:  1.   Chronic diastolic heart failure: Volume status improved after starting furosemide 40 mg once daily. However, renal function worsened. I elected to discontinue hydrochlorothiazide and switched her instead to lisinopril 40 mg once daily. Recheck basic metabolic profile in one week.  2. Severe pulmonary hypertension: Most of her pulmonary hypertension is likely due to diastolic heart failure and mitral stenosis. However, the degree of pulmonary pressure elevation seems to be out of proportion and I suspect some other etiologies including sleep apnea and possible underlying lung disease. I'm going to refer her to Dr. [place] Bells.  2. Mild to moderate mitral stenosis:  This is mild to moderate by both  echo and cardiac cath.  3. Essential hypertension: Blood pressure improved from before.  4. Hyperlipidemia: Most recent LDL was 157.  She was placed on atorvastatin.   Disposition:   FU with me in 1 month  Signed,  [name], MD  05/10/2017 2:00 PM    [place]

## 2017-05-15 DIAGNOSIS — G4730 Sleep apnea, unspecified: Secondary | ICD-10-CM | POA: Diagnosis not present

## 2017-05-17 ENCOUNTER — Other Ambulatory Visit
Admission: RE | Admit: 2017-05-17 | Discharge: 2017-05-17 | Disposition: A | Discharge status: Home / Self Care | Payer: Medicare Other | Source: Ambulatory Visit | Attending: Cardiovascular Disease | Admitting: Cardiovascular Disease

## 2017-05-17 DIAGNOSIS — I10 Essential (primary) hypertension: Secondary | ICD-10-CM

## 2017-05-17 LAB — BASIC METABOLIC PANEL
Anion gap: 10 (ref 5–15)
BUN: 37 mg/dL — AB (ref 6–20)
CO2: 29 mmol/L (ref 22–32)
CREATININE: 1.57 mg/dL — AB (ref 0.44–1.00)
Calcium: 9.1 mg/dL (ref 8.9–10.3)
Chloride: 97 mmol/L — ABNORMAL LOW (ref 101–111)
GFR calc Af Amer: 37 mL/min — ABNORMAL LOW (ref >60)
GFR, EST NON AFRICAN AMERICAN: 32 mL/min — AB (ref >60)
Glucose, Bld: 310 mg/dL — ABNORMAL HIGH (ref 65–99)
Potassium: 4.1 mmol/L (ref 3.5–5.1)
SODIUM: 136 mmol/L (ref 135–145)

## 2017-05-22 ENCOUNTER — Ambulatory Visit: Payer: Medicare Other | Admitting: Primary Care

## 2017-05-29 ENCOUNTER — Ambulatory Visit (INDEPENDENT_AMBULATORY_CARE_PROVIDER_SITE_OTHER): Payer: Medicare Other | Admitting: Primary Care

## 2017-05-29 ENCOUNTER — Encounter: Payer: Self-pay | Admitting: Primary Care

## 2017-05-29 VITALS — BP 136/74 | HR 67 | Temp 98.0°F | Wt 277.8 lb

## 2017-05-29 DIAGNOSIS — E119 Type 2 diabetes mellitus without complications: Secondary | ICD-10-CM | POA: Diagnosis not present

## 2017-05-29 DIAGNOSIS — E785 Hyperlipidemia, unspecified: Secondary | ICD-10-CM | POA: Diagnosis not present

## 2017-05-29 DIAGNOSIS — E118 Type 2 diabetes mellitus with unspecified complications: Secondary | ICD-10-CM | POA: Diagnosis not present

## 2017-05-29 DIAGNOSIS — I10 Essential (primary) hypertension: Secondary | ICD-10-CM

## 2017-05-29 DIAGNOSIS — R0609 Other forms of dyspnea: Secondary | ICD-10-CM | POA: Diagnosis not present

## 2017-05-29 DIAGNOSIS — N183 Chronic kidney disease, stage 3 unspecified: Secondary | ICD-10-CM

## 2017-05-29 NOTE — Assessment & Plan Note (Signed)
Stable on lisinopril and carvedilol. Doing well on HCTZ. Recent BMP improved. Continue to monitor.

## 2017-05-29 NOTE — Assessment & Plan Note (Signed)
Will check lipids in October 2018 with A1C.

## 2017-05-29 NOTE — Assessment & Plan Note (Signed)
Evaluated by cardiology, overall improved with furosemide. Continue cardiology follow up. Strongly encouraged her to follow up with pulmonology for evaluation of OSA.

## 2017-05-29 NOTE — Assessment & Plan Note (Signed)
Due for repeat A1C in mid October 2018.

## 2017-05-29 NOTE — Progress Notes (Signed)
Subjective:    Patient ID: [name], female    DOB: 1946/08/27, 71 y.o.   MRN: [id number]  HPI  Ms. Gentz is a 71 year old female who presents today for follow up.  1) Chronic diastolic heart failure: Chronic shortness of breath for years, worse over the past 1 year. History of aortic murmur. 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 She was initiated on furosemide 40 mg once daily. She was referred to pulmonology for sleep study given severely elevated pulmonary pressures. She was removed from her HCTZ   She is on continuous oxygen at night, previously managed on CPAP for which she could not tolerate. She is scheduled to see pulmonology next month. She is compliant to her furosemide daily. Recent BMP with creatinine of 1.57 which is a decrease from 1.8. Overall she's noticed a decrease in shortness of breath but does have to rest often with exertion.   Wt Readings from Last 3 Encounters:  05/29/17 277 lb 12.8 oz (126 kg)  05/10/17 270 lb (122.5 kg)  04/25/17 273 lb (123.8 kg)     2) Hyperlipidemia: Currently managed on atorvastatin 40 mg, previously on pravastatin which was discontinued during her last visit. Her last lipid panel was above goal but had not started atorvastatin at that time. She is not fasting today.   3) Essential Hypertension: Currently managed on lisinopril 40 mg, carvedilol 12.5 mg BID, furosemide 40 mg. She's checking her BP at home which is running 130's/70's. She denies chest pain.      Review of Systems  Constitutional: Positive for fatigue.  Respiratory: Positive for shortness of breath. Negative for cough and wheezing.   Cardiovascular: Negative for chest pain.  Neurological: Negative for dizziness and headaches.       Past Medical History:  Diagnosis  Date  . Arthritis   . Depression   . GERD (gastroesophageal reflux disease)   . Hyperlipidemia   . Hypertension   . Murmur   . Seasonal allergies   . Type 2 diabetes mellitus ([place])   . Urinary incontinence      Social History   Social History  . Marital status: Widowed    Spouse name: N/A  . Number of children: N/A  . Years of education: N/A   Occupational History  . Not on file.   Social History Main Topics  . Smoking status: Never Smoker  . Smokeless tobacco: Never Used  . Alcohol use No     Comment: rarely  . Drug use: No  . Sexual activity: Not on file   Other Topics Concern  . Not on file   Social History Narrative   Widow.   2 children, numerous grandchildren.   Retired. Once worked on a Publishing copy.   Enjoys playing computer games, spending time with family.    Past Surgical History:  Procedure Laterality Date  . BACK SURGERY    . CARDIAC CATHETERIZATION    . KNEE SURGERY Left   . RIGHT/LEFT HEART CATH AND CORONARY ANGIOGRAPHY Bilateral 04/25/2017   Procedure: Right/Left Heart Cath and Coronary Angiography;  Surgeon: Wellington Hampshire, MD;  Location: [facility];  Service: Cardiovascular;  Laterality: Bilateral;  . TONSILLECTOMY AND ADENOIDECTOMY  1951    Family History  Problem Relation Age of Onset  . Lung cancer Father   . Alzheimer's disease Mother   .  Stroke Maternal Grandfather     No Known Allergies  Current Outpatient Prescriptions on File Prior to Visit  Medication Sig Dispense Refill  . ACCU-CHEK AVIVA PLUS test strip USE TO TEST BLOOD SUGAR ONE TO TWO TIMES DAILY AND AS DIRECTED 300 each 2  . acetaminophen (TYLENOL) 650 MG CR tablet Take 1,300-1,950 mg by mouth daily as needed for pain.    [name] atorvastatin (LIPITOR) 40 MG tablet Take 1 tablet (40 mg total) by mouth daily. 90 tablet 1  . Blood Glucose Monitoring Suppl (ACCU-CHEK AVIVA PLUS) w/Device KIT Use as directed to check blood sugar 2 times daily. 1 kit 0  . buPROPion  (WELLBUTRIN SR) 150 MG 12 hr tablet TAKE 1 TABLET BY MOUTH TWICE DAILY 180 tablet 0  . carvedilol (COREG) 12.5 MG tablet Take 1 tablet (12.5 mg total) by mouth 2 (two) times daily. 180 tablet 3  . Cholecalciferol (VITAMIN D3) 5000 units CAPS Take 5,000 Units by mouth daily.    . ferrous sulfate 325 (65 FE) MG tablet Take 325 mg by mouth daily.    [name] FLUoxetine (PROZAC) 40 MG capsule TAKE ONE CAPSULE BY MOUTH ONCE DAILY 90 capsule 2  . fluticasone (FLONASE) 50 MCG/ACT nasal spray Place 1 spray into both nostrils daily. (Patient taking differently: Place 2 sprays into both nostrils daily. ) 48 g 1  . furosemide (LASIX) 40 MG tablet Take 1 tablet (40 mg total) by mouth daily. 90 tablet 3  . glimepiride (AMARYL) 4 MG tablet Take 2 tablets (8 mg total) by mouth daily with breakfast. 180 tablet 1  . lisinopril (PRINIVIL,ZESTRIL) 40 MG tablet Take 1 tablet (40 mg total) by mouth daily. 90 tablet 3  . Melatonin 10 MG TABS Take 10 mg by mouth at bedtime.    . Omega-3 1000 MG CAPS Take 1,000 mg by mouth daily.    [name] omeprazole (PRILOSEC) 40 MG capsule TAKE ONE CAPSULE BY MOUTH ONCE DAILY 90 capsule 1  . oxybutynin (DITROPAN) 5 MG tablet TAKE ONE TABLET BY MOUTH THREE TIMES DAILY 270 tablet 0  . triamcinolone cream (KENALOG) 0.1 % Apply 1 application topically 2 (two) times daily. (Patient taking differently: Apply 1 application topically daily as needed (rash). ) 30 g 0   No current facility-administered medications on file prior to visit.     BP 136/74   Pulse 67   Temp 98 F (36.7 C) (Oral)   Wt 277 lb 12.8 oz (126 kg)   SpO2 95%   BMI 44.84 kg/m    Objective:   Physical Exam  Constitutional: She appears well-nourished.  Neck: Neck supple.  Cardiovascular: Normal rate and regular rhythm.   Pulmonary/Chest: Effort normal and breath sounds normal. She has no wheezes. She has no rales.  Skin: Skin is warm and dry.  Psychiatric: She has a normal mood and affect.          Assessment &  Plan:

## 2017-05-29 NOTE — Patient Instructions (Addendum)
Continue furosemide 40 mg.  Follow up with cardiology and pulmonology as scheduled.  Schedule a lab only appointment after October 18th to recheck cholesterol and diabetes.  Schedule a follow up visit with me in 6 months.  It was a pleasure to see you today!

## 2017-06-12 ENCOUNTER — Ambulatory Visit: Payer: Medicare Other | Admitting: Nurse Practitioner

## 2017-06-13 DIAGNOSIS — H16223 Keratoconjunctivitis sicca, not specified as Sjogren's, bilateral: Secondary | ICD-10-CM | POA: Diagnosis not present

## 2017-06-13 DIAGNOSIS — H40053 Ocular hypertension, bilateral: Secondary | ICD-10-CM | POA: Diagnosis not present

## 2017-06-13 LAB — HM DIABETES EYE EXAM

## 2017-06-14 ENCOUNTER — Encounter: Payer: Self-pay | Admitting: Primary Care

## 2017-06-15 DIAGNOSIS — G4730 Sleep apnea, unspecified: Secondary | ICD-10-CM | POA: Diagnosis not present

## 2017-06-20 ENCOUNTER — Ambulatory Visit (INDEPENDENT_AMBULATORY_CARE_PROVIDER_SITE_OTHER): Payer: Medicare Other | Admitting: Nurse Practitioner

## 2017-06-20 ENCOUNTER — Encounter: Payer: Self-pay | Admitting: Nurse Practitioner

## 2017-06-20 VITALS — BP 158/82 | HR 59 | Ht 66.5 in | Wt 277.5 lb

## 2017-06-20 DIAGNOSIS — I2721 Secondary pulmonary arterial hypertension: Secondary | ICD-10-CM | POA: Diagnosis not present

## 2017-06-20 DIAGNOSIS — I5030 Unspecified diastolic (congestive) heart failure: Secondary | ICD-10-CM

## 2017-06-20 DIAGNOSIS — E119 Type 2 diabetes mellitus without complications: Secondary | ICD-10-CM

## 2017-06-20 DIAGNOSIS — I050 Rheumatic mitral stenosis: Secondary | ICD-10-CM

## 2017-06-20 DIAGNOSIS — G4733 Obstructive sleep apnea (adult) (pediatric): Secondary | ICD-10-CM

## 2017-06-20 DIAGNOSIS — I10 Essential (primary) hypertension: Secondary | ICD-10-CM | POA: Diagnosis not present

## 2017-06-20 MED ORDER — CARVEDILOL 12.5 MG PO TABS
18.7500 mg | ORAL_TABLET | Freq: Two times a day (BID) | ORAL | 3 refills | Status: DC
Start: 2017-06-20 — End: 2017-12-27

## 2017-06-20 NOTE — Patient Instructions (Signed)
Medication Instructions:  Your physician has recommended you make the following change in your medication:  INCREASE coreg to 18.75mg  twice daily   Labwork: none  Testing/Procedures: none  Follow-Up: Your physician recommends that you schedule a follow-up appointment in: 2 months with Dr. Fletcher Anon.    Any Other Special Instructions Will Be Listed Below (If Applicable). Check blood pressure at home once a day for one week then call us with the readings.      If you need a refill on your cardiac medications before your next appointment, please call your pharmacy.

## 2017-06-20 NOTE — Progress Notes (Signed)
Office Visit    Patient Name: Megan Obrien Date of Encounter: 06/20/2017  Primary Care Provider:  Pleas Koch, NP Primary Cardiologist:  Jerilynn Mages. Fletcher Anon, MD   Chief Complaint    71 year old ? With a history of HFpEF, stage III chronic kidney disease, hypertension, hyperlipidemia, mild to moderate mitral stenosis, nonobstructive CAD, morbid obesity, pulmonary hypertension, type 2 diabetes mellitus, and obstructive sleep apnea, who presents for follow-up.  Past Medical History    Past Medical History:  Diagnosis Date  . (HFpEF) heart failure with preserved ejection fraction ([place])    a. 03/2017 Echo: EF 55-60%, no rwma, Gr1 DD, mild to mod MS, mod to sev dil LA.  [name] Arthritis   . CKD (chronic kidney disease), stage III   . Depression   . GERD (gastroesophageal reflux disease)   . Hyperlipidemia   . Hypertension   . Mitral stenosis    a. Mild to moderate by echo 03/2017; b. 03/2017 R/LHC: PCWP 46mHg, LVEDP 136mg. Mean grad of 9m7m. Valve area of 2.02cm^2.  . Morbid obesity ([facility] . Murmur   . Non-obstructive CAD (coronary artery disease)    a. 03/2017 Cath: mild, non-obstructive CAD.  . OMarland KitchenA (obstructive sleep apnea)    a. Previously wore CPAP for several years but stopped on her own and now just uses O2 via [place] @ night.  . PMarland KitchenH (pulmonary artery hypertension) ([place]  a. 03/2017 RHC: PA 95m56m  . Seasonal allergies   . Type 2 diabetes mellitus ([facility])[place]. Urinary incontinence    Past Surgical History:  Procedure Laterality Date  . BACK SURGERY    . CARDIAC CATHETERIZATION    . KNEE SURGERY Left   . RIGHT/LEFT HEART CATH AND CORONARY ANGIOGRAPHY Bilateral 04/25/2017   Procedure: Right/Left Heart Cath and Coronary Angiography;  Surgeon: AridWellington Hampshire;  [facility];  Service: Cardiovascular;  Laterality: Bilateral;  . TONSILLECTOMY AND ADENOIDECTOMY  1951    Allergies  No Known Allergies  History of Present Illness    71 y49r old ? With  the above complex past medical history including HFpEF, stage III chronic disease, hypertension, hyperlipidemia, mild to moderate mitral stenosis, nonobstructive CAD, morbid obesity, pulmonary hypertension, type 2 diabetes mellitus, and obstructive sleep apnea. She was last seen in clinic on August 10, following evaluation for mitral stenosis including right and left heart cardiac catheterization in July which showed nonobstructive CAD and moderately elevated right heart filling pressures including severe pulmonary hypertension. Following catheterization, she was placed on Lasix therapy. At follow-up, creatinine was elevated at 1.86. At that visit, she was switched from lisinopril-HCTZ to simply lisinopril. She was maintained on Lasix 40 mg daily. Follow-up basic metabolic panel on August 17 showed improved creatinine at 1.57 with stable potassium at 4.1.  Since her last visit, she feels as though her energy is a little bit better. She has stable, chronic dyspnea on exertion but notes that her exertion is very limited. She spends most of her day in her recliner with her legs elevated. In that setting, she has not had any significant lower extremity swelling. She has chronic two-pillow orthopnea which is actually improved and down from 3 pillow orthopnea. Her appetite has been good and she denies any PND, palpitations, syncope, or early satiety. She does not weigh herself at home. Her weight today is similar to what was on August 29, which is above where she was on August 10. She does occasionally note lightheadedness  with standing but has not fallen recently.  Home Medications    Prior to Admission medications   Medication Sig Start Date End Date Taking? Authorizing Provider  ACCU-CHEK AVIVA PLUS test strip USE TO TEST BLOOD SUGAR ONE TO TWO TIMES DAILY AND AS DIRECTED 09/18/16  Yes Pleas Koch, NP  acetaminophen (TYLENOL) 650 MG CR tablet Take 1,300-1,950 mg by mouth daily as needed for pain.   Yes  [provider]  atorvastatin (LIPITOR) 40 MG tablet Take 1 tablet (40 mg total) by mouth daily. 01/17/17  Yes Pleas Koch, NP  Blood Glucose Monitoring Suppl (ACCU-CHEK AVIVA PLUS) w/Device KIT Use as directed to check blood sugar 2 times daily. 08/30/16  Yes Pleas Koch, NP  buPROPion [facility] SR) 150 MG 12 hr tablet TAKE 1 TABLET BY MOUTH TWICE DAILY 03/18/17  Yes Pleas Koch, NP  carvedilol (COREG) 12.5 MG tablet Take 1.5 tablets (18.75 mg total) by mouth 2 (two) times daily. 06/20/17 09/18/17 Yes Rogelia Mire, NP  Cholecalciferol (VITAMIN D3) 5000 units CAPS Take 5,000 Units by mouth daily.   Yes [provider]  ferrous sulfate 325 (65 FE) MG tablet Take 325 mg by mouth daily.   Yes [provider]  FLUoxetine (PROZAC) 40 MG capsule TAKE ONE CAPSULE BY MOUTH ONCE DAILY 12/31/16  Yes Pleas Koch, NP  fluticasone (FLONASE) 50 MCG/ACT nasal spray Place 1 spray into both nostrils daily. Patient taking differently: Place 2 sprays into both nostrils daily.  02/21/17  Yes Pleas Koch, NP  furosemide (LASIX) 40 MG tablet Take 1 tablet (40 mg total) by mouth daily. 04/25/17 07/24/17 Yes Wellington Hampshire, MD  glimepiride (AMARYL) 4 MG tablet Take 2 tablets (8 mg total) by mouth daily with breakfast. 04/17/17  Yes Pleas Koch, NP  lisinopril (PRINIVIL,ZESTRIL) 40 MG tablet Take 1 tablet (40 mg total) by mouth daily. 05/10/17 08/08/17 Yes Wellington Hampshire, MD  Melatonin 10 MG TABS Take 10 mg by mouth at bedtime.   Yes [provider]  Omega-3 1000 MG CAPS Take 1,000 mg by mouth daily.   Yes [provider]  omeprazole (PRILOSEC) 40 MG capsule TAKE ONE CAPSULE BY MOUTH ONCE DAILY 12/31/16  Yes Pleas Koch, NP  oxybutynin (DITROPAN) 5 MG tablet TAKE ONE TABLET BY MOUTH THREE TIMES DAILY 03/18/17  Yes Pleas Koch, NP  triamcinolone cream (KENALOG) 0.1 % Apply 1 application topically 2 (two) times  daily. Patient taking differently: Apply 1 application topically daily as needed (rash).  08/30/16  Yes Pleas Koch, NP    Review of Systems    As noted above, she has been stable and may be doing a little bit better. She continues to have mild lightheadedness if she gets up too quickly and so she is more intentional about this. She has not had any chest pain, PND, palpitations, syncope, edema, or early satiety. She stable, chronic 2 pillow orthopnea.  All other systems reviewed and are otherwise negative except as noted above.  Physical Exam    VS:  BP (!) 158/82 (BP Location: Left Arm, Patient Position: Sitting, Cuff Size: Large)   Pulse (!) 59   Ht 5' 6.5" (1.689 m)   Wt 277 lb 8 oz (125.9 kg)   BMI 44.12 kg/m  , BMI Body mass index is 44.12 kg/m. IRS:WNIOE, in no acute distress.  HEENT: normal.  Neck: Supple, no JVD, carotid bruits, or masses. Cardiac: RRR, I noted periods of  increased heart rate during examination. 2/6 systolic ejection murmur at the upper sternal borders. I do not appreciate a diastolic murmur/mitral stenosis. No no rubs, or gallops. No clubbing, cyanosis, edema.  Radials/DP/PT 2+ and equal bilaterally.  Respiratory:  Respirations regular and unlabored, clear to auscultation bilaterally. GI: Soft, nontender, nondistended, BS + x 4. MS: no deformity or atrophy. Skin: warm and dry, no rash. Neuro:  Strength and sensation are intact. Psych: Normal affect.  Accessory Clinical Findings    ECG - Sinus bradycardia, 59, left axis deviation, left anterior fascicular block. Rhythm strip does show periods of faster heart rate, around 80 bpm with normal P wave morphology and PR interval.  Assessment & Plan    1.  Chronic diastolic congestive heart failure: Patient's weight is up today however she has been symptomatically stable. She does not have any evidence of significant volume overload. She is very sedentary and I suspect that any weight gain may be adipose as  opposed to volume. She has chronic stable dyspnea on exertion and does not exert often. She spends most of her day in her recliner.  Blood pressure is elevated today at 158/82. She checks her blood pressure at home and notes similar numbers there. I'll increase her carvedilol to 18.75 mg twice a day. She otherwise remains on Lasix 40 mg daily, lisinopril 40 mg daily.  We discussed the importance of daily weights, sodium restriction, medication compliance, and symptom reporting and she verbalizes understanding.   2. Essential hypertension: Blood pressure elevated as above. Titrating carvedilol. Of note, heart rate listed at 59 and 12-lead but on exam, she was noted to be faster and rhythm strip confirms this with periods of rates in the 80s.  3. Pulmonary arterial hypertension: Patient has follow-up with pulmonology next month. She continues to use oxygen at night.  4. Obstructive sleep apnea: Patient has a prior diagnosis of sleep apnea and previously wore CPAP for several years so she stopped using it because it was uncomfortable. She has not tried anything other than the mask. She understands that she may require retesting and that sleep apnea is likely contributing to her pulmonary hypertension.  5. Mild to moderate mitral stenosis: Mild to moderate by both echocardiogram and right/left heart catheterization. Continue to follow.  6. Stage III chronic kidney disease: Creatinine is stable at most recent follow-up. She remains on ACE inhibitor and Lasix.  7. Type 2 diabetes mellitus: This is followed by primary care and she is treated with Amaryl. Her A1c was 8.6 in July.  8. Palpitations: Patient reports having expressed palpitations over a year ago, described as fast and hard beats. She denies any palpitations today. I noted on examination, that she had periods of higher heart rates and we documented this by rhythm strip where she appears to have a sinus arrhythmia with rates increasing into the 80s  and then dropping back down into the high 50s and 60s. She is not symptomatic when this occurs.  9. Disposition: Patient will check her blood pressure at home and contact us in a week with some numbers so that we can make decisions regarding future antihypertensive therapy. She may do well with amlodipine if necessary, then we will have to look for lower extremity swelling in that case. She will follow-up with Dr. Fletcher Anon in 2 months or sooner if necessary.  Murray Hodgkins, NP 06/20/2017, 1:22 PM

## 2017-07-13 ENCOUNTER — Other Ambulatory Visit: Payer: Self-pay | Admitting: Primary Care

## 2017-07-13 DIAGNOSIS — F331 Major depressive disorder, recurrent, moderate: Secondary | ICD-10-CM

## 2017-07-13 DIAGNOSIS — K219 Gastro-esophageal reflux disease without esophagitis: Secondary | ICD-10-CM

## 2017-07-15 DIAGNOSIS — G4730 Sleep apnea, unspecified: Secondary | ICD-10-CM | POA: Diagnosis not present

## 2017-07-17 ENCOUNTER — Institutional Professional Consult (permissible substitution): Payer: Medicare Other | Admitting: Pulmonary Disease

## 2017-07-19 ENCOUNTER — Other Ambulatory Visit: Payer: Medicare Other

## 2017-08-07 ENCOUNTER — Institutional Professional Consult (permissible substitution): Payer: Medicare Other | Admitting: Internal Medicine

## 2017-08-15 DIAGNOSIS — G4730 Sleep apnea, unspecified: Secondary | ICD-10-CM | POA: Diagnosis not present

## 2017-09-14 DIAGNOSIS — G4730 Sleep apnea, unspecified: Secondary | ICD-10-CM | POA: Diagnosis not present

## 2017-09-17 ENCOUNTER — Other Ambulatory Visit: Payer: Self-pay | Admitting: Primary Care

## 2017-09-17 DIAGNOSIS — F329 Major depressive disorder, single episode, unspecified: Secondary | ICD-10-CM

## 2017-09-17 DIAGNOSIS — F32A Depression, unspecified: Secondary | ICD-10-CM

## 2017-09-17 DIAGNOSIS — N3941 Urge incontinence: Secondary | ICD-10-CM

## 2017-09-17 DIAGNOSIS — R809 Proteinuria, unspecified: Secondary | ICD-10-CM

## 2017-09-17 DIAGNOSIS — E1129 Type 2 diabetes mellitus with other diabetic kidney complication: Secondary | ICD-10-CM

## 2017-10-15 DIAGNOSIS — G4730 Sleep apnea, unspecified: Secondary | ICD-10-CM | POA: Diagnosis not present

## 2017-11-15 DIAGNOSIS — G4730 Sleep apnea, unspecified: Secondary | ICD-10-CM | POA: Diagnosis not present

## 2017-11-29 ENCOUNTER — Other Ambulatory Visit: Payer: Self-pay | Admitting: Primary Care

## 2017-12-13 DIAGNOSIS — G4730 Sleep apnea, unspecified: Secondary | ICD-10-CM | POA: Diagnosis not present

## 2017-12-17 ENCOUNTER — Emergency Department: Payer: Medicare Other

## 2017-12-17 ENCOUNTER — Encounter: Payer: Self-pay | Admitting: Internal Medicine

## 2017-12-17 ENCOUNTER — Inpatient Hospital Stay
Admission: EM | Admit: 2017-12-17 | Discharge: 2017-12-23 | DRG: 065 | Disposition: A | Discharge status: Skilled Nursing Facility | Payer: Medicare Other | Attending: Internal Medicine | Admitting: Internal Medicine

## 2017-12-17 DIAGNOSIS — Z79899 Other long term (current) drug therapy: Secondary | ICD-10-CM

## 2017-12-17 DIAGNOSIS — I10 Essential (primary) hypertension: Secondary | ICD-10-CM | POA: Diagnosis present

## 2017-12-17 DIAGNOSIS — I6340 Cerebral infarction due to embolism of unspecified cerebral artery: Principal | ICD-10-CM | POA: Diagnosis present

## 2017-12-17 DIAGNOSIS — N183 Chronic kidney disease, stage 3 (moderate): Secondary | ICD-10-CM | POA: Diagnosis present

## 2017-12-17 DIAGNOSIS — E785 Hyperlipidemia, unspecified: Secondary | ICD-10-CM | POA: Diagnosis present

## 2017-12-17 DIAGNOSIS — R4701 Aphasia: Secondary | ICD-10-CM | POA: Diagnosis not present

## 2017-12-17 DIAGNOSIS — R42 Dizziness and giddiness: Secondary | ICD-10-CM | POA: Diagnosis not present

## 2017-12-17 DIAGNOSIS — I5032 Chronic diastolic (congestive) heart failure: Secondary | ICD-10-CM | POA: Diagnosis present

## 2017-12-17 DIAGNOSIS — I480 Paroxysmal atrial fibrillation: Secondary | ICD-10-CM | POA: Diagnosis present

## 2017-12-17 DIAGNOSIS — E118 Type 2 diabetes mellitus with unspecified complications: Secondary | ICD-10-CM | POA: Diagnosis present

## 2017-12-17 DIAGNOSIS — R29703 NIHSS score 3: Secondary | ICD-10-CM | POA: Diagnosis present

## 2017-12-17 DIAGNOSIS — Z7951 Long term (current) use of inhaled steroids: Secondary | ICD-10-CM

## 2017-12-17 DIAGNOSIS — K219 Gastro-esophageal reflux disease without esophagitis: Secondary | ICD-10-CM | POA: Diagnosis present

## 2017-12-17 DIAGNOSIS — G4733 Obstructive sleep apnea (adult) (pediatric): Secondary | ICD-10-CM | POA: Diagnosis present

## 2017-12-17 DIAGNOSIS — I130 Hypertensive heart and chronic kidney disease with heart failure and stage 1 through stage 4 chronic kidney disease, or unspecified chronic kidney disease: Secondary | ICD-10-CM | POA: Diagnosis present

## 2017-12-17 DIAGNOSIS — E669 Obesity, unspecified: Secondary | ICD-10-CM | POA: Diagnosis present

## 2017-12-17 DIAGNOSIS — E1122 Type 2 diabetes mellitus with diabetic chronic kidney disease: Secondary | ICD-10-CM | POA: Diagnosis present

## 2017-12-17 DIAGNOSIS — F32A Depression, unspecified: Secondary | ICD-10-CM | POA: Diagnosis present

## 2017-12-17 DIAGNOSIS — W19XXXA Unspecified fall, initial encounter: Secondary | ICD-10-CM | POA: Diagnosis not present

## 2017-12-17 DIAGNOSIS — I639 Cerebral infarction, unspecified: Secondary | ICD-10-CM | POA: Diagnosis present

## 2017-12-17 DIAGNOSIS — Z823 Family history of stroke: Secondary | ICD-10-CM

## 2017-12-17 DIAGNOSIS — F329 Major depressive disorder, single episode, unspecified: Secondary | ICD-10-CM | POA: Diagnosis present

## 2017-12-17 DIAGNOSIS — I2510 Atherosclerotic heart disease of native coronary artery without angina pectoris: Secondary | ICD-10-CM | POA: Diagnosis present

## 2017-12-17 DIAGNOSIS — Z6841 Body Mass Index (BMI) 40.0 and over, adult: Secondary | ICD-10-CM

## 2017-12-17 DIAGNOSIS — G969 Disorder of central nervous system, unspecified: Secondary | ICD-10-CM | POA: Diagnosis not present

## 2017-12-17 DIAGNOSIS — R531 Weakness: Secondary | ICD-10-CM | POA: Diagnosis not present

## 2017-12-17 DIAGNOSIS — Z7984 Long term (current) use of oral hypoglycemic drugs: Secondary | ICD-10-CM

## 2017-12-17 HISTORY — DX: Peripheral vascular disease, unspecified: I73.9

## 2017-12-17 HISTORY — DX: Cerebrovascular disease, unspecified: I67.9

## 2017-12-17 HISTORY — DX: Disorder of arteries and arterioles, unspecified: I77.9

## 2017-12-17 HISTORY — DX: Intracardiac thrombosis, not elsewhere classified: I51.3

## 2017-12-17 HISTORY — DX: Cerebral infarction, unspecified: I63.9

## 2017-12-17 MED ORDER — SODIUM CHLORIDE 0.9 % IV BOLUS (SEPSIS)
1000.0000 mL | Freq: Once | INTRAVENOUS | Status: AC
  Administered 2017-12-18: 1000 mL via INTRAVENOUS

## 2017-12-17 MED ORDER — IOPAMIDOL (ISOVUE-370) INJECTION 76%
75.0000 mL | Freq: Once | INTRAVENOUS | Status: AC | PRN
  Administered 2017-12-17: 75 mL via INTRAVENOUS

## 2017-12-17 NOTE — ED Triage Notes (Signed)
Pt BIB EMS reporting dizziness and difficulty finding her words.

## 2017-12-17 NOTE — ED Provider Notes (Signed)
[facility] Emergency Department Provider Note    First MD Initiated Contact with Patient 12/17/17 2339     (approximate)  I have reviewed the triage vital signs and the nursing notes.   HISTORY  Chief Complaint Dizziness   HPI Megan Obrien is a 72 y.o. female presents to the emergency department via EMS with below list of chronic medical conditions presents to the emergency department acute onset of dizziness and word finding difficulty on awakening this morning.  Patient states that she woke to go to the bathroom and on doing so became acutely dizzy.  Patient states the dizziness has persisted and is currently present.  Patient also admits that word finding difficulty is also persistent.  Patient denies any weakness numbness.  Patient denies any visual changes.  Past Medical History:  Diagnosis Date  . (HFpEF) heart failure with preserved ejection fraction ([place])    a. 03/2017 Echo: EF 55-60%, no rwma, Gr1 DD, mild to mod MS, mod to sev dil LA.  [name] Arthritis   . CKD (chronic kidney disease), stage III ([place])   . Depression   . GERD (gastroesophageal reflux disease)   . Hyperlipidemia   . Hypertension   . Mitral stenosis    a. Mild to moderate by echo 03/2017; b. 03/2017 R/LHC: PCWP 83mHg, LVEDP 127mg. Mean grad of 53m22m. Valve area of 2.02cm^2.  . Morbid obesity ([place] . [place]   . Non-obstructive CAD (coronary artery disease)    a. 03/2017 Cath: mild, non-obstructive CAD.  . OMarland KitchenA (obstructive sleep apnea)    a. Previously wore CPAP for several years but stopped on her own and now just uses O2 via [place] @ night.  . PMarland KitchenH (pulmonary artery hypertension) (HCC[place]  a. 03/2017 RHC: PA 71m73m  . Seasonal allergies   . Type 2 diabetes mellitus (HCC)[place]. Urinary incontinence     Patient Active Problem List   Diagnosis Date Noted  . Expressive aphasia 12/18/2017  . Mitral valve disease   . Exertional dyspnea 04/17/2017  . Fatigue 02/20/2017  . 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 Medicare annual wellness visit, subsequent 07/24/2016  . Hyperlipidemia 02/06/2016  . Essential hypertension 02/06/2016  . Type 2 diabetes mellitus with complication, without long-term current use of insulin (HCC)[place]/05/2016  . Depression 02/06/2016  . GERD (gastroesophageal reflux disease) 02/06/2016  . Urge incontinence of urine 02/06/2016    Past Surgical History:  Procedure Laterality Date  . BACK SURGERY    . CARDIAC CATHETERIZATION    . KNEE SURGERY Left   . RIGHT/LEFT HEART CATH AND CORONARY ANGIOGRAPHY Bilateral 04/25/2017   Procedure: Right/Left Heart Cath and Coronary Angiography;  Surgeon: AridWellington Hampshire;  [facility];  Service: Cardiovascular;  Laterality: Bilateral;  . TONSILLECTOMY AND ADENOIDECTOMY  1952    Prior to Admission medications   Medication Sig Start Date End Date Taking? Authorizing Provider  ACCU-CHEK AVIVA PLUS test strip USE ONE STRIP TO CHECK GLUCOSE ONCE TO TWICE DAILY AND AS DIRECTED 11/29/17  Yes ClarPleas Koch  atorvastatin (LIPITOR) 40 MG tablet Take 1 tablet (40 mg total) by mouth daily. 01/17/17  Yes ClarPleas Koch  Blood Glucose Monitoring Suppl (ACCU-CHEK AVIVA PLUS) w/Device KIT Use as directed to check blood sugar 2 times daily. 08/30/16  Yes ClarPleas Koch  buPROPion ([facility] 150 MG 12 hr tablet TAKE 1 TABLET BY MOUTH TWICE DAILY 07/15/17  Yes ClarCarlis Abbott  Leticia Penna, NP  carvedilol (COREG) 12.5 MG tablet Take 1.5 tablets (18.75 mg total) by mouth 2 (two) times daily. 06/20/17 12/18/17 Yes Theora Gianotti, NP  Cholecalciferol (VITAMIN D3) 5000 units CAPS Take 5,000 Units by mouth daily.   Yes [provider]  ferrous sulfate 325 (65 FE) MG tablet Take 325 mg by mouth daily.   Yes [provider]  FLUoxetine (PROZAC) 40 MG capsule TAKE 1 CAPSULE BY MOUTH ONCE DAILY 09/17/17  Yes Pleas Koch, NP  fluticasone (FLONASE) 50 MCG/ACT nasal spray Place 1 spray  into both nostrils daily. Patient taking differently: Place 2 sprays into both nostrils daily.  02/21/17  Yes Pleas Koch, NP  furosemide (LASIX) 40 MG tablet Take 1 tablet (40 mg total) by mouth daily. 04/25/17 12/18/17 Yes Wellington Hampshire, MD  glimepiride (AMARYL) 4 MG tablet TAKE 2 TABLETS BY MOUTH ONCE DAILY WITH  BREAKFAST 09/17/17  Yes Pleas Koch, NP  lisinopril (PRINIVIL,ZESTRIL) 40 MG tablet Take 1 tablet (40 mg total) by mouth daily. 05/10/17 12/18/17 Yes Wellington Hampshire, MD  Melatonin 10 MG TABS Take 10 mg by mouth at bedtime.   Yes [provider]  Omega-3 1000 MG CAPS Take 1,000 mg by mouth daily.   Yes [provider]  omeprazole (PRILOSEC) 40 MG capsule TAKE 1 CAPSULE BY MOUTH ONCE DAILY 07/15/17  Yes Pleas Koch, NP  oxybutynin (DITROPAN) 5 MG tablet TAKE 1 TABLET BY MOUTH THREE TIMES DAILY 09/17/17  Yes Pleas Koch, NP  acetaminophen (TYLENOL) 650 MG CR tablet Take 1,300-1,950 mg by mouth daily as needed for pain.    [provider]  triamcinolone cream (KENALOG) 0.1 % Apply 1 application topically 2 (two) times daily. Patient taking differently: Apply 1 application topically daily as needed (rash).  08/30/16   Pleas Koch, NP    Allergies No known drug allergies  Family History  Problem Relation Age of Onset  . Lung cancer Father   . Alzheimer's disease Mother   . Stroke Maternal Grandfather     Social History Social History   Tobacco Use  . Smoking status: Never Smoker  . Smokeless tobacco: Never Used  Substance Use Topics  . Alcohol use: No    Alcohol/week: 0.0 oz    Comment: rarely  . Drug use: No    Review of Systems Constitutional: No fever/chills Eyes: No visual changes. ENT: No sore throat. Cardiovascular: Denies chest pain. Respiratory: Denies shortness of breath. Gastrointestinal: No abdominal pain.  No nausea, no vomiting.  No diarrhea.  No constipation. Genitourinary: Negative for  dysuria. Musculoskeletal: Negative for neck pain.  Negative for back pain. Integumentary: Negative for rash. Neurological: Negative for headaches, focal weakness or numbness.  Positive for dizziness  ____________________________________________   PHYSICAL EXAM:  VITAL SIGNS: ED Triage Vitals  Enc Vitals Group     BP      Pulse      Resp      Temp      Temp src      SpO2      Weight      Height      Head Circumference      Peak Flow      Pain Score      Pain Loc      Pain Edu?      Excl. in [place]?     Constitutional: Alert and oriented. Well appearing and in no acute distress. Eyes: Conjunctivae are normal.  PERRL. EOMI.Vertical nystagmus Head: Atraumatic. Ears:  Healthy appearing ear canals and TMs bilaterally Nose: No congestion/rhinnorhea. Mouth/Throat: Mucous membranes are moist.  Oropharynx non-erythematous. Neck: No stridor.  No meningeal signs.   Cardiovascular: Normal rate, regular rhythm. Good peripheral circulation. Grossly normal heart sounds. Respiratory: Normal respiratory effort.  No retractions. Lungs CTAB. Gastrointestinal: Soft and nontender. No distention.  Musculoskeletal: No lower extremity tenderness nor edema. No gross deformities of extremities. Neurologic: Intermittent expressive aphasia no gross focal neurologic deficits are appreciated.  Skin:  Skin is warm, dry and intact. No rash noted. Psychiatric: Mood and affect are normal. Speech and behavior are normal.  ____________________________________________   LABS (all labs ordered are listed, but only abnormal results are displayed)  Labs Reviewed  COMPREHENSIVE METABOLIC PANEL - Abnormal; Notable for the following components:      Result Value   Chloride 98 (*)    Glucose, Bld 404 (*)    BUN 31 (*)    Creatinine, Ser 1.60 (*)    Albumin 3.4 (*)    ALT 13 (*)    GFR calc non Af Amer 31 (*)    GFR calc Af Amer 36 (*)    All other components within normal limits  URINALYSIS, ROUTINE W  REFLEX MICROSCOPIC - Abnormal; Notable for the following components:   Color, Urine STRAW (*)    APPearance HAZY (*)    Glucose, UA >=500 (*)    Hgb urine dipstick MODERATE (*)    Protein, ur 100 (*)    Leukocytes, UA TRACE (*)    Bacteria, UA RARE (*)    Squamous Epithelial / LPF 0-5 (*)    All other components within normal limits  LIPID PANEL - Abnormal; Notable for the following components:   Cholesterol 231 (*)    Triglycerides 213 (*)    VLDL 43 (*)    LDL Cholesterol 145 (*)    All other components within normal limits  CREATININE, SERUM - Abnormal; Notable for the following components:   Creatinine, Ser 1.57 (*)    GFR calc non Af Amer 32 (*)    GFR calc Af Amer 37 (*)    All other components within normal limits  ETHANOL  PROTIME-INR  APTT  CBC  DIFFERENTIAL  TROPONIN I  URINE DRUG SCREEN, QUALITATIVE ([facility] ONLY)  CBC  HEMOGLOBIN A1C   _______________  RADIOLOGY I, [place] Lucianne Smestad, personally viewed and evaluated these images (plain radiographs) as part of my medical decision making, as well as reviewing the written report by the radiologist.   Official radiology report(s): Ct Angio Head W Or Wo Contrast  Result Date: 12/18/2017 CLINICAL DATA:  Initial evaluation for acute expressive aphasia, vertical nystagmus. EXAM: CT ANGIOGRAPHY HEAD AND NECK. TECHNIQUE: Multidetector CT imaging of the head and neck was performed using the standard protocol during bolus administration of intravenous contrast. Multiplanar CT image reconstructions and MIPs were obtained to evaluate the vascular anatomy. Carotid stenosis measurements (when applicable) are obtained utilizing NASCET criteria, using the distal internal carotid diameter as the denominator. CONTRAST:  79m ISOVUE-370 IOPAMIDOL (ISOVUE-370) INJECTION 76% COMPARISON:  Prior CT from 10/10/2016. FINDINGS: CT HEAD FINDINGS Brain: Cerebral atrophy with chronic small vessel ischemic disease, progressed relative to 2018. Small  remote subcortical right frontal infarct noted. No acute intracranial hemorrhage. No acute large vessel territory infarct. No mass lesion, midline shift or mass effect. No hydrocephalus. 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 No extra-axial fluid collection. Vascular: No hyperdense vessel. Calcified atherosclerosis present at  the skull base. Skull: Scalp soft tissues and calvarium within normal limits. Sinuses: Visualized paranasal sinuses are clear. Small left mastoid effusion noted. Orbits: Globes orbital soft tissues within normal limits. Review of the MIP images confirms the above findings CTA NECK FINDINGS Aortic arch: Visualized aortic arch of normal caliber with normal 3 vessel morphology. No flow-limiting stenosis about the origin of the great vessels. Visualized subclavian arteries widely patent. Right carotid system: Right common carotid artery tortuous proximally but widely patent to the bifurcation without stenosis. Right carotid artery system medialized into the retropharyngeal space. Mixed plaque about the proximal right ICA with associated stenosis of up to 45% by NASCET criteria. Right ICA widely patent distally to the skull base without stenosis, dissection, or occlusion. Left carotid system: Left common carotid artery patent from its origin to the bifurcation without stenosis. Left carotid artery system medialized into the retropharyngeal space. Calcified plaque at the left bifurcation with associated stenosis of approximately 60% by NASCET criteria. Distally, left ICA widely patent to the skull base without stenosis, dissection, or occlusion. Vertebral arteries: Both of the vertebral arteries arise from the subclavian arteries. Focal plaque at the origin of the right vertebral artery without significant stenosis. Vertebral arteries patent within the neck without stenosis, dissection, or occlusion. Skeleton: No acute osseous abnormality identified. Advanced degenerative spondylolysis present  at C4-5 through C6-7. Advanced facet arthrosis present throughout the cervical spine, worse on the right. Other neck: No acute soft tissue abnormality within the neck. Salivary glands normal. Subcentimeter hypodense right thyroid nodule noted, of doubtful significance. Thyroid otherwise unremarkable. No adenopathy. Upper chest: Visualized upper chest demonstrates no acute abnormality. Visualized lungs are clear. Review of the MIP images confirms the above findings CTA HEAD FINDINGS Anterior circulation: Petrous segments patent bilaterally. Scattered atheromatous plaque within the cavernous/supraclinoid ICAs with resultant mild to moderate multifocal narrowing, worse on the right. No high-grade stenosis. ICA termini widely patent. A1 segments patent bilaterally. Normal anterior communicating artery. Anterior cerebral arteries patent to their distal aspects without stenosis. M1 segments patent without stenosis. No proximal M2 occlusion. Prominent atheromatous irregularity throughout the MCA branches bilaterally with several prominent severe distal M3 stenoses, seen on the left on series 10, image 88), seen on the right on series 10, image 80). Posterior circulation: Atheromatous plaque within the bilateral V4 segments with resultant mild to moderate stenoses, left slightly worse than right. Left vertebral artery slightly dominant. Left PICA patent. Right PICA not well visualized. Basilar artery widely patent to its distal aspect without stenosis. Superior cerebral arteries patent bilaterally. Both of the posterior cerebral arteries primarily supplied via the basilar. Moderate to severe distal left P2 stenosis noted (series 10, image 98). There are severe tandem right P2 stenoses. Distal small vessel atheromatous irregularity. Venous sinuses: Patent. Anatomic variants: None significant.  No intracranial aneurysm. Delayed phase: Not performed. Review of the MIP images confirms the above findings IMPRESSION: 1. Negative  CTA for emergent large vessel occlusion. No acute intracranial abnormality identified. 2. 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 3. Moderate to advanced intracranial atherosclerotic changes above with multiple prominent severe bilateral distal MCA branch stenoses, with severe bilateral P2 stenoses. No proximal high-grade or correctable stenosis identified. Results were called by telephone at the time of interpretation on 12/18/2017 at 12:41 am to Dr. Marjean Donna , who verbally acknowledged these results. Electronically Signed   By: Jeannine Boga M.D.   On: 12/18/2017 01:01  Ct Angio Neck W And/or Wo Contrast  Result Date: 12/18/2017 CLINICAL DATA:  Initial evaluation for acute expressive aphasia, vertical nystagmus. EXAM: CT ANGIOGRAPHY HEAD AND NECK. TECHNIQUE: Multidetector CT imaging of the head and neck was performed using the standard protocol during bolus administration of intravenous contrast. Multiplanar CT image reconstructions and MIPs were obtained to evaluate the vascular anatomy. Carotid stenosis measurements (when applicable) are obtained utilizing NASCET criteria, using the distal internal carotid diameter as the denominator. CONTRAST:  16m ISOVUE-370 IOPAMIDOL (ISOVUE-370) INJECTION 76% COMPARISON:  Prior CT from 10/10/2016. FINDINGS: CT HEAD FINDINGS Brain: Cerebral atrophy with chronic small vessel ischemic disease, progressed relative to 2018. Small remote subcortical right frontal infarct noted. No acute intracranial hemorrhage. No acute large vessel territory infarct. No mass lesion, midline shift or mass effect. No hydrocephalus. 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 No extra-axial fluid collection. Vascular: No hyperdense vessel. Calcified atherosclerosis present at the skull base. Skull: Scalp soft tissues and calvarium within normal limits. Sinuses: Visualized paranasal  sinuses are clear. Small left mastoid effusion noted. Orbits: Globes orbital soft tissues within normal limits. Review of the MIP images confirms the above findings CTA NECK FINDINGS Aortic arch: Visualized aortic arch of normal caliber with normal 3 vessel morphology. No flow-limiting stenosis about the origin of the great vessels. Visualized subclavian arteries widely patent. Right carotid system: Right common carotid artery tortuous proximally but widely patent to the bifurcation without stenosis. Right carotid artery system medialized into the retropharyngeal space. Mixed plaque about the proximal right ICA with associated stenosis of up to 45% by NASCET criteria. Right ICA widely patent distally to the skull base without stenosis, dissection, or occlusion. Left carotid system: Left common carotid artery patent from its origin to the bifurcation without stenosis. Left carotid artery system medialized into the retropharyngeal space. Calcified plaque at the left bifurcation with associated stenosis of approximately 60% by NASCET criteria. Distally, left ICA widely patent to the skull base without stenosis, dissection, or occlusion. Vertebral arteries: Both of the vertebral arteries arise from the subclavian arteries. Focal plaque at the origin of the right vertebral artery without significant stenosis. Vertebral arteries patent within the neck without stenosis, dissection, or occlusion. Skeleton: No acute osseous abnormality identified. Advanced degenerative spondylolysis present at C4-5 through C6-7. Advanced facet arthrosis present throughout the cervical spine, worse on the right. Other neck: No acute soft tissue abnormality within the neck. Salivary glands normal. Subcentimeter hypodense right thyroid nodule noted, of doubtful significance. Thyroid otherwise unremarkable. No adenopathy. Upper chest: Visualized upper chest demonstrates no acute abnormality. Visualized lungs are clear. Review of the MIP images  confirms the above findings CTA HEAD FINDINGS Anterior circulation: Petrous segments patent bilaterally. Scattered atheromatous plaque within the cavernous/supraclinoid ICAs with resultant mild to moderate multifocal narrowing, worse on the right. No high-grade stenosis. ICA termini widely patent. A1 segments patent bilaterally. Normal anterior communicating artery. Anterior cerebral arteries patent to their distal aspects without stenosis. M1 segments patent without stenosis. No proximal M2 occlusion. Prominent atheromatous irregularity throughout the MCA branches bilaterally with several prominent severe distal M3 stenoses, seen on the left on series 10, image 88), seen on the right on series 10, image 80). Posterior circulation: Atheromatous plaque within the bilateral V4 segments with resultant mild to moderate stenoses, left slightly worse than right. Left vertebral artery slightly dominant. Left PICA patent. Right PICA not well visualized. Basilar artery widely patent to its distal aspect without stenosis. Superior cerebral arteries patent bilaterally. Both of the posterior cerebral  arteries primarily supplied via the basilar. Moderate to severe distal left P2 stenosis noted (series 10, image 98). There are severe tandem right P2 stenoses. Distal small vessel atheromatous irregularity. Venous sinuses: Patent. Anatomic variants: None significant.  No intracranial aneurysm. Delayed phase: Not performed. Review of the MIP images confirms the above findings IMPRESSION: 1. Negative CTA for emergent large vessel occlusion. No acute intracranial abnormality identified. 2. 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 3. Moderate to advanced intracranial atherosclerotic changes above with multiple prominent severe bilateral distal MCA branch stenoses, with severe bilateral P2 stenoses. No proximal high-grade or correctable  stenosis identified. Results were called by telephone at the time of interpretation on 12/18/2017 at 12:41 am to Dr. Marjean Donna , who verbally acknowledged these results. Electronically Signed   By: Jeannine Boga M.D.   On: 12/18/2017 01:01     .Critical Care Performed by: Gregor Hams, MD Authorized by: Gregor Hams, MD   Critical care provider statement:    Critical care time was exclusive of:  Separately billable procedures and treating other patients   Critical care was necessary to treat or prevent imminent or life-threatening deterioration of the following conditions:  CNS failure or compromise   Critical care was time spent personally by me on the following activities:  Development of treatment plan with patient or surrogate, discussions with consultants, evaluation of patient's response to treatment, examination of patient, obtaining history from patient or surrogate, ordering and performing treatments and interventions, ordering and review of laboratory studies, ordering and review of radiographic studies, pulse oximetry, re-evaluation of patient's condition and review of old charts   I assumed direction of critical care for this patient from another provider in my specialty: no       ____________________________________________   INITIAL IMPRESSION / ASSESSMENT AND PLAN / ED COURSE    72 year old female presented with above-stated history and physical exam concerning for possible CVA and as such as stroke protocol was initiated.  CT Angie of the head revealed no acute findings as per radiologist.  Patient evaluated by [facility] psychiatry which I spoke with the physician as well.  Patient subsequently discussed with Dr. Jannifer Franklin for hospital admission for further evaluation and management of possible CVA __________  FINAL CLINICAL IMPRESSION(S) / ED DIAGNOSES  Final diagnoses:  Expressive aphasia     MEDICATIONS GIVEN DURING THIS VISIT:  Medications    atorvastatin (LIPITOR) tablet 40 mg (not administered)  buPROPion (WELLBUTRIN SR) 12 hr tablet 150 mg (not administered)  FLUoxetine (PROZAC) capsule 40 mg (not administered)  pantoprazole (PROTONIX) EC tablet 40 mg (not administered)  oxybutynin (DITROPAN) tablet 5 mg (not administered)  acetaminophen (TYLENOL) tablet 650 mg (not administered)    Or  acetaminophen (TYLENOL) solution 650 mg (not administered)    Or  acetaminophen (TYLENOL) suppository 650 mg (not administered)  heparin injection 5,000 Units (5,000 Units Subcutaneous Given 12/18/17 0654)  insulin aspart (novoLOG) injection 0-9 Units (not administered)  insulin aspart (novoLOG) injection 0-5 Units (not administered)  sodium chloride 0.9 % bolus 1,000 mL (0 mLs Intravenous Stopped 12/18/17 0444)  iopamidol (ISOVUE-370) 76 % injection 75 mL (75 mLs Intravenous Contrast Given 12/17/17 2355)  labetalol (NORMODYNE,TRANDATE) injection 10 mg (10 mg Intravenous Given 12/18/17 0040)   stroke: mapping our early stages of recovery book ( Does not apply Given 12/18/17 0559)     ED Discharge Orders    None  Note:  This document was prepared using Dragon voice recognition software and may include unintentional dictation errors.    Gregor Hams, MD 12/18/17 [phone]

## 2017-12-18 ENCOUNTER — Observation Stay: Payer: Medicare Other

## 2017-12-18 ENCOUNTER — Other Ambulatory Visit: Payer: Self-pay

## 2017-12-18 ENCOUNTER — Observation Stay (HOSPITAL_COMMUNITY)
Admit: 2017-12-18 | Discharge: 2017-12-18 | Disposition: A | Discharge status: Home / Self Care | Payer: Medicare Other | Attending: Internal Medicine | Admitting: Internal Medicine

## 2017-12-18 DIAGNOSIS — I130 Hypertensive heart and chronic kidney disease with heart failure and stage 1 through stage 4 chronic kidney disease, or unspecified chronic kidney disease: Secondary | ICD-10-CM | POA: Diagnosis not present

## 2017-12-18 DIAGNOSIS — M6281 Muscle weakness (generalized): Secondary | ICD-10-CM | POA: Diagnosis not present

## 2017-12-18 DIAGNOSIS — I639 Cerebral infarction, unspecified: Secondary | ICD-10-CM | POA: Diagnosis not present

## 2017-12-18 DIAGNOSIS — R42 Dizziness and giddiness: Secondary | ICD-10-CM | POA: Diagnosis not present

## 2017-12-18 DIAGNOSIS — Z794 Long term (current) use of insulin: Secondary | ICD-10-CM | POA: Diagnosis not present

## 2017-12-18 DIAGNOSIS — I342 Nonrheumatic mitral (valve) stenosis: Secondary | ICD-10-CM | POA: Diagnosis not present

## 2017-12-18 DIAGNOSIS — E669 Obesity, unspecified: Secondary | ICD-10-CM | POA: Diagnosis present

## 2017-12-18 DIAGNOSIS — M1990 Unspecified osteoarthritis, unspecified site: Secondary | ICD-10-CM | POA: Diagnosis not present

## 2017-12-18 DIAGNOSIS — Z823 Family history of stroke: Secondary | ICD-10-CM | POA: Diagnosis not present

## 2017-12-18 DIAGNOSIS — G4733 Obstructive sleep apnea (adult) (pediatric): Secondary | ICD-10-CM | POA: Diagnosis present

## 2017-12-18 DIAGNOSIS — E1122 Type 2 diabetes mellitus with diabetic chronic kidney disease: Secondary | ICD-10-CM | POA: Diagnosis present

## 2017-12-18 DIAGNOSIS — I69393 Ataxia following cerebral infarction: Secondary | ICD-10-CM | POA: Diagnosis not present

## 2017-12-18 DIAGNOSIS — I340 Nonrheumatic mitral (valve) insufficiency: Secondary | ICD-10-CM | POA: Diagnosis not present

## 2017-12-18 DIAGNOSIS — I69398 Other sequelae of cerebral infarction: Secondary | ICD-10-CM | POA: Diagnosis not present

## 2017-12-18 DIAGNOSIS — Z7951 Long term (current) use of inhaled steroids: Secondary | ICD-10-CM | POA: Diagnosis not present

## 2017-12-18 DIAGNOSIS — I341 Nonrheumatic mitral (valve) prolapse: Secondary | ICD-10-CM

## 2017-12-18 DIAGNOSIS — I10 Essential (primary) hypertension: Secondary | ICD-10-CM | POA: Diagnosis not present

## 2017-12-18 DIAGNOSIS — Z6841 Body Mass Index (BMI) 40.0 and over, adult: Secondary | ICD-10-CM | POA: Diagnosis not present

## 2017-12-18 DIAGNOSIS — I69320 Aphasia following cerebral infarction: Secondary | ICD-10-CM | POA: Diagnosis not present

## 2017-12-18 DIAGNOSIS — N183 Chronic kidney disease, stage 3 (moderate): Secondary | ICD-10-CM | POA: Diagnosis not present

## 2017-12-18 DIAGNOSIS — G459 Transient cerebral ischemic attack, unspecified: Secondary | ICD-10-CM | POA: Diagnosis not present

## 2017-12-18 DIAGNOSIS — I5030 Unspecified diastolic (congestive) heart failure: Secondary | ICD-10-CM | POA: Diagnosis not present

## 2017-12-18 DIAGNOSIS — Z7401 Bed confinement status: Secondary | ICD-10-CM | POA: Diagnosis not present

## 2017-12-18 DIAGNOSIS — I5032 Chronic diastolic (congestive) heart failure: Secondary | ICD-10-CM | POA: Diagnosis not present

## 2017-12-18 DIAGNOSIS — K219 Gastro-esophageal reflux disease without esophagitis: Secondary | ICD-10-CM | POA: Diagnosis present

## 2017-12-18 DIAGNOSIS — I480 Paroxysmal atrial fibrillation: Secondary | ICD-10-CM | POA: Diagnosis not present

## 2017-12-18 DIAGNOSIS — Z7984 Long term (current) use of oral hypoglycemic drugs: Secondary | ICD-10-CM | POA: Diagnosis not present

## 2017-12-18 DIAGNOSIS — I63429 Cerebral infarction due to embolism of unspecified anterior cerebral artery: Secondary | ICD-10-CM | POA: Diagnosis not present

## 2017-12-18 DIAGNOSIS — I2510 Atherosclerotic heart disease of native coronary artery without angina pectoris: Secondary | ICD-10-CM | POA: Diagnosis not present

## 2017-12-18 DIAGNOSIS — E119 Type 2 diabetes mellitus without complications: Secondary | ICD-10-CM | POA: Diagnosis not present

## 2017-12-18 DIAGNOSIS — I6523 Occlusion and stenosis of bilateral carotid arteries: Secondary | ICD-10-CM | POA: Diagnosis not present

## 2017-12-18 DIAGNOSIS — R4701 Aphasia: Secondary | ICD-10-CM | POA: Diagnosis present

## 2017-12-18 DIAGNOSIS — F329 Major depressive disorder, single episode, unspecified: Secondary | ICD-10-CM | POA: Diagnosis present

## 2017-12-18 DIAGNOSIS — Z79899 Other long term (current) drug therapy: Secondary | ICD-10-CM | POA: Diagnosis not present

## 2017-12-18 DIAGNOSIS — E785 Hyperlipidemia, unspecified: Secondary | ICD-10-CM | POA: Diagnosis not present

## 2017-12-18 DIAGNOSIS — I6340 Cerebral infarction due to embolism of unspecified cerebral artery: Secondary | ICD-10-CM | POA: Diagnosis not present

## 2017-12-18 DIAGNOSIS — R29703 NIHSS score 3: Secondary | ICD-10-CM | POA: Diagnosis present

## 2017-12-18 LAB — URINALYSIS, ROUTINE W REFLEX MICROSCOPIC
BILIRUBIN URINE: NEGATIVE
Ketones, ur: NEGATIVE mg/dL
NITRITE: NEGATIVE
Protein, ur: 100 mg/dL — AB
Specific Gravity, Urine: 1.01 (ref 1.005–1.030)
pH: 8 (ref 5.0–8.0)

## 2017-12-18 LAB — ECHOCARDIOGRAM COMPLETE
HEIGHTINCHES: 66 in
WEIGHTICAEL: 4348.8 [oz_av]

## 2017-12-18 LAB — COMPREHENSIVE METABOLIC PANEL
ALT: 13 U/L — ABNORMAL LOW (ref 14–54)
ANION GAP: 8 (ref 5–15)
AST: 20 U/L (ref 15–41)
Albumin: 3.4 g/dL — ABNORMAL LOW (ref 3.5–5.0)
Alkaline Phosphatase: 75 U/L (ref 38–126)
BUN: 31 mg/dL — ABNORMAL HIGH (ref 6–20)
CHLORIDE: 98 mmol/L — AB (ref 101–111)
CO2: 29 mmol/L (ref 22–32)
Calcium: 9.2 mg/dL (ref 8.9–10.3)
Creatinine, Ser: 1.6 mg/dL — ABNORMAL HIGH (ref 0.44–1.00)
GFR calc non Af Amer: 31 mL/min — ABNORMAL LOW (ref >60)
GFR, EST AFRICAN AMERICAN: 36 mL/min — AB (ref >60)
Glucose, Bld: 404 mg/dL — ABNORMAL HIGH (ref 65–99)
POTASSIUM: 4.1 mmol/L (ref 3.5–5.1)
Sodium: 135 mmol/L (ref 135–145)
Total Bilirubin: 0.6 mg/dL (ref 0.3–1.2)
Total Protein: 7.4 g/dL (ref 6.5–8.1)

## 2017-12-18 LAB — DIFFERENTIAL
BASOS ABS: 0 10*3/uL (ref 0–0.1)
BASOS PCT: 1 %
EOS ABS: 0.2 10*3/uL (ref 0–0.7)
Eosinophils Relative: 3 %
Lymphocytes Relative: 19 %
Lymphs Abs: 1.2 10*3/uL (ref 1.0–3.6)
MONOS PCT: 5 %
Monocytes Absolute: 0.4 10*3/uL (ref 0.2–0.9)
NEUTROS ABS: 4.8 10*3/uL (ref 1.4–6.5)
NEUTROS PCT: 72 %

## 2017-12-18 LAB — URINE DRUG SCREEN, QUALITATIVE (ARMC ONLY)
AMPHETAMINES, UR SCREEN: NOT DETECTED
BENZODIAZEPINE, UR SCRN: NOT DETECTED
Barbiturates, Ur Screen: NOT DETECTED
Cannabinoid 50 Ng, Ur ~~LOC~~: NOT DETECTED
Cocaine Metabolite,Ur ~~LOC~~: NOT DETECTED
MDMA (ECSTASY) UR SCREEN: NOT DETECTED
METHADONE SCREEN, URINE: NOT DETECTED
Opiate, Ur Screen: NOT DETECTED
Phencyclidine (PCP) Ur S: NOT DETECTED
TRICYCLIC, UR SCREEN: NOT DETECTED

## 2017-12-18 LAB — CBC
HCT: 36 % (ref 35.0–47.0)
HEMATOCRIT: 37.9 % (ref 35.0–47.0)
Hemoglobin: 12.5 g/dL (ref 12.0–16.0)
Hemoglobin: 13.3 g/dL (ref 12.0–16.0)
MCH: 31.2 pg (ref 26.0–34.0)
MCH: 31.2 pg (ref 26.0–34.0)
MCHC: 34.8 g/dL (ref 32.0–36.0)
MCHC: 35.1 g/dL (ref 32.0–36.0)
MCV: 89.1 fL (ref 80.0–100.0)
MCV: 89.5 fL (ref 80.0–100.0)
PLATELETS: 170 10*3/uL (ref 150–440)
PLATELETS: 173 10*3/uL (ref 150–440)
RBC: 4.02 MIL/uL (ref 3.80–5.20)
RBC: 4.26 MIL/uL (ref 3.80–5.20)
RDW: 12.7 % (ref 11.5–14.5)
RDW: 12.8 % (ref 11.5–14.5)
WBC: 6.6 10*3/uL (ref 3.6–11.0)
WBC: 9.4 10*3/uL (ref 3.6–11.0)

## 2017-12-18 LAB — LIPID PANEL
Cholesterol: 231 mg/dL — ABNORMAL HIGH (ref 0–200)
HDL: 43 mg/dL (ref >40)
LDL CALC: 145 mg/dL — AB (ref 0–99)
Total CHOL/HDL Ratio: 5.4 RATIO
Triglycerides: 213 mg/dL — ABNORMAL HIGH (ref <150)
VLDL: 43 mg/dL — AB (ref 0–40)

## 2017-12-18 LAB — HEMOGLOBIN A1C
Hgb A1c MFr Bld: 11.4 % — ABNORMAL HIGH (ref 4.8–5.6)
Mean Plasma Glucose: 280.48 mg/dL

## 2017-12-18 LAB — PROTIME-INR
INR: 0.98
PROTHROMBIN TIME: 12.9 s (ref 11.4–15.2)

## 2017-12-18 LAB — GLUCOSE, CAPILLARY
GLUCOSE-CAPILLARY: 194 mg/dL — AB (ref 65–99)
Glucose-Capillary: 387 mg/dL — ABNORMAL HIGH (ref 65–99)
Glucose-Capillary: 369 mg/dL — ABNORMAL HIGH (ref 65–99)
Glucose-Capillary: 232 mg/dL — ABNORMAL HIGH (ref 65–99)

## 2017-12-18 LAB — ETHANOL

## 2017-12-18 LAB — TROPONIN I

## 2017-12-18 LAB — APTT: APTT: 32 s (ref 24–36)

## 2017-12-18 LAB — CREATININE, SERUM
CREATININE: 1.57 mg/dL — AB (ref 0.44–1.00)
GFR calc Af Amer: 37 mL/min — ABNORMAL LOW (ref >60)
GFR calc non Af Amer: 32 mL/min — ABNORMAL LOW (ref >60)

## 2017-12-18 MED ORDER — INSULIN STARTER KIT- SYRINGES (ENGLISH)
1.0000 | Freq: Once | Status: AC
  Administered 2017-12-18: 1
  Filled 2017-12-18 (×2): qty 1

## 2017-12-18 MED ORDER — LIVING WELL WITH DIABETES BOOK
Freq: Once | Status: AC
  Administered 2017-12-18: 16:00:00
  Filled 2017-12-18: qty 1

## 2017-12-18 MED ORDER — INSULIN ASPART 100 UNIT/ML ~~LOC~~ SOLN
0.0000 [IU] | Freq: Every day | SUBCUTANEOUS | Status: DC
  Administered 2017-12-18: 22:00:00 2 [IU] via SUBCUTANEOUS
  Filled 2017-12-18: qty 1

## 2017-12-18 MED ORDER — STROKE: EARLY STAGES OF RECOVERY BOOK
Freq: Once | Status: AC
  Administered 2017-12-18: 06:00:00

## 2017-12-18 MED ORDER — OXYBUTYNIN CHLORIDE 5 MG PO TABS
5.0000 mg | ORAL_TABLET | Freq: Three times a day (TID) | ORAL | Status: DC
  Administered 2017-12-18 – 2017-12-23 (×16): 5 mg via ORAL
  Filled 2017-12-18 (×16): qty 1

## 2017-12-18 MED ORDER — INSULIN GLARGINE 100 UNIT/ML ~~LOC~~ SOLN
20.0000 [IU] | Freq: Every day | SUBCUTANEOUS | Status: DC
  Administered 2017-12-18 – 2017-12-20 (×3): 20 [IU] via SUBCUTANEOUS
  Filled 2017-12-18 (×3): qty 0.2

## 2017-12-18 MED ORDER — HEPARIN SODIUM (PORCINE) 5000 UNIT/ML IJ SOLN
5000.0000 [IU] | Freq: Three times a day (TID) | INTRAMUSCULAR | Status: DC
  Administered 2017-12-18 – 2017-12-20 (×6): 5000 [IU] via SUBCUTANEOUS
  Filled 2017-12-18 (×6): qty 1

## 2017-12-18 MED ORDER — ATORVASTATIN CALCIUM 20 MG PO TABS
40.0000 mg | ORAL_TABLET | Freq: Every day | ORAL | Status: DC
  Administered 2017-12-18 – 2017-12-23 (×6): 40 mg via ORAL
  Filled 2017-12-18 (×6): qty 2

## 2017-12-18 MED ORDER — FLUOXETINE HCL 20 MG PO CAPS
40.0000 mg | ORAL_CAPSULE | Freq: Every day | ORAL | Status: DC
  Administered 2017-12-18 – 2017-12-23 (×6): 40 mg via ORAL
  Filled 2017-12-18 (×6): qty 2

## 2017-12-18 MED ORDER — ACETAMINOPHEN 650 MG RE SUPP: 650.0000 mg | RECTAL | Status: DC | PRN

## 2017-12-18 MED ORDER — BUPROPION HCL ER (SR) 150 MG PO TB12
150.0000 mg | ORAL_TABLET | Freq: Two times a day (BID) | ORAL | Status: DC
  Administered 2017-12-18 – 2017-12-23 (×11): 150 mg via ORAL
  Filled 2017-12-18 (×12): qty 1

## 2017-12-18 MED ORDER — INSULIN ASPART 100 UNIT/ML ~~LOC~~ SOLN
0.0000 [IU] | Freq: Three times a day (TID) | SUBCUTANEOUS | Status: DC
Start: 2017-12-18 — End: 2017-12-23
  Administered 2017-12-18 (×2): 9 [IU] via SUBCUTANEOUS
  Administered 2017-12-18: 18:00:00 2 [IU] via SUBCUTANEOUS
  Administered 2017-12-19: 1 [IU] via SUBCUTANEOUS
  Administered 2017-12-19: 12:00:00 3 [IU] via SUBCUTANEOUS
  Administered 2017-12-19: 5 [IU] via SUBCUTANEOUS
  Administered 2017-12-20 (×2): 2 [IU] via SUBCUTANEOUS
  Administered 2017-12-21: 1 [IU] via SUBCUTANEOUS
  Administered 2017-12-21: 2 [IU] via SUBCUTANEOUS
  Administered 2017-12-21: 3 [IU] via SUBCUTANEOUS
  Administered 2017-12-22 (×2): 1 [IU] via SUBCUTANEOUS
  Administered 2017-12-22: 2 [IU] via SUBCUTANEOUS
  Administered 2017-12-23: 12:00:00 5 [IU] via SUBCUTANEOUS
  Filled 2017-12-18 (×14): qty 1

## 2017-12-18 MED ORDER — GLIMEPIRIDE 4 MG PO TABS
4.0000 mg | ORAL_TABLET | Freq: Every day | ORAL | Status: DC
  Administered 2017-12-19 – 2017-12-23 (×5): 4 mg via ORAL
  Filled 2017-12-18 (×5): qty 1

## 2017-12-18 MED ORDER — PANTOPRAZOLE SODIUM 40 MG PO TBEC
40.0000 mg | DELAYED_RELEASE_TABLET | Freq: Every day | ORAL | Status: DC
  Administered 2017-12-18 – 2017-12-23 (×6): 40 mg via ORAL
  Filled 2017-12-18 (×6): qty 1

## 2017-12-18 MED ORDER — LABETALOL HCL 5 MG/ML IV SOLN
10.0000 mg | Freq: Once | INTRAVENOUS | Status: AC
  Administered 2017-12-18: 10 mg via INTRAVENOUS
  Filled 2017-12-18: qty 4

## 2017-12-18 MED ORDER — METFORMIN HCL 500 MG PO TABS: 500.0000 mg | ORAL_TABLET | Freq: Two times a day (BID) | ORAL | Status: DC

## 2017-12-18 MED ORDER — ACETAMINOPHEN 160 MG/5ML PO SOLN
650.0000 mg | ORAL | Status: DC | PRN
  Filled 2017-12-18: qty 20.3

## 2017-12-18 MED ORDER — ACETAMINOPHEN 325 MG PO TABS
650.0000 mg | ORAL_TABLET | ORAL | Status: DC | PRN
  Administered 2017-12-18 – 2017-12-23 (×6): 650 mg via ORAL
  Filled 2017-12-18 (×6): qty 2

## 2017-12-18 NOTE — Care Management Obs Status (Signed)
[place] NOTIFICATION   Patient Details  Name: Megan Obrien MRN: [id number] Date of Birth: 01-04-1946   Medicare Observation Status Notification Given:  Yes    Katrina Stack, RN 12/18/2017, 9:54 AM

## 2017-12-18 NOTE — Care Management (Addendum)
Placed in observation for sx concerning for stroke. Independent in all adls, denies issues accessing medical care, obtaining medications. Friends and family to her to MD appointments. Current with her PCP. PT, SLP, OT and neuro pending. Addressed external catheter with primary nurse

## 2017-12-18 NOTE — H&P (Signed)
[facility] NAME: Megan Obrien    MR#:  [id number]  DATE OF BIRTH:  November 10, 1945  DATE OF ADMISSION:  12/17/2017  PRIMARY CARE PHYSICIAN: Pleas Koch, NP   REQUESTING/REFERRING PHYSICIAN: Owens Shark, MD  CHIEF COMPLAINT:   Chief Complaint  Patient presents with  . Dizziness    HISTORY OF PRESENT ILLNESS:  Megan Obrien  is a 72 y.o. female who presents with dizziness and expressive aphasia.  Patient states symptoms started in the morning, but she attributed them to something else, or felt that they would pass.  They progressed during the day and in the evening she came to ED for evaluation.  Initial workup was largely within normal limits, but her symptoms are persistent and highly concerning for stroke.  Hospitalist were called for admission  PAST MEDICAL HISTORY:   Past Medical History:  Diagnosis Date  . (HFpEF) heart failure with preserved ejection fraction ([place])    a. 03/2017 Echo: EF 55-60%, no rwma, Gr1 DD, mild to mod MS, mod to sev dil LA.  [name] Arthritis   . CKD (chronic kidney disease), stage III ([place])   . Depression   . GERD (gastroesophageal reflux disease)   . Hyperlipidemia   . Hypertension   . Mitral stenosis    a. Mild to moderate by echo 03/2017; b. 03/2017 R/LHC: PCWP 37mHg, LVEDP 134mg. Mean grad of 64m51m. Valve area of 2.02cm^2.  . Morbid obesity ([place]   . Non-obstructive CAD (coronary artery disease)    a. 03/2017 Cath: mild, non-obstructive CAD.  . OMarland KitchenA (obstructive sleep apnea)    a. Previously wore CPAP for several years but stopped on her own and now just uses O2 via [place] @ night.  . PMarland KitchenH (pulmonary artery hypertension) ([facility]  a. 03/2017 RHC: PA 55m61m  . Seasonal allergies   . Type 2 diabetes mellitus (HCC)[place]. Urinary incontinence      PAST SURGICAL HISTORY:   Past Surgical History:  Procedure Laterality Date  . BACK SURGERY    . CARDIAC CATHETERIZATION    . KNEE  SURGERY Left   . RIGHT/LEFT HEART CATH AND CORONARY ANGIOGRAPHY Bilateral 04/25/2017   Procedure: Right/Left Heart Cath and Coronary Angiography;  Surgeon: AridWellington Hampshire;  [facility];  Service: Cardiovascular;  Laterality: Bilateral;  . TONSILLECTOMY AND ADENOIDECTOMY  1951     SOCIAL HISTORY:   Social History   Tobacco Use  . Smoking status: Never Smoker  . Smokeless tobacco: Never Used  Substance Use Topics  . Alcohol use: No    Alcohol/week: 0.0 oz    Comment: rarely     FAMILY HISTORY:   Family History  Problem Relation Age of Onset  . Lung cancer Father   . Alzheimer's disease Mother   . Stroke Maternal Grandfather      DRUG ALLERGIES:  No Known Allergies  MEDICATIONS AT HOME:   Prior to Admission medications   Medication Sig Start Date End Date Taking? Authorizing Provider  ACCU-CHEK AVIVA PLUS test strip USE ONE STRIP TO CHECK GLUCOSE ONCE TO TWICE DAILY AND AS DIRECTED 11/29/17   ClarPleas Koch  acetaminophen (TYLENOL) 650 MG CR tablet Take 1,300-1,950 mg by mouth daily as needed for pain.    [provider]  atorvastatin (LIPITOR) 40 MG tablet Take 1 tablet (40 mg total) by mouth daily. 01/17/17   ClarPleas Koch  Blood Glucose Monitoring Suppl (ACCU-CHEK AVIVA PLUS) w/Device KIT Use as directed to check blood sugar 2 times daily. 08/30/16   Pleas Koch, NP  buPROPion [facility] SR) 150 MG 12 hr tablet TAKE 1 TABLET BY MOUTH TWICE DAILY 07/15/17   Pleas Koch, NP  carvedilol (COREG) 12.5 MG tablet Take 1.5 tablets (18.75 mg total) by mouth 2 (two) times daily. 06/20/17 09/18/17  Theora Gianotti, NP  Cholecalciferol (VITAMIN D3) 5000 units CAPS Take 5,000 Units by mouth daily.    [provider]  ferrous sulfate 325 (65 FE) MG tablet Take 325 mg by mouth daily.    [provider]  FLUoxetine (PROZAC) 40 MG capsule TAKE 1 CAPSULE BY MOUTH ONCE DAILY 09/17/17   Pleas Koch, NP  fluticasone (FLONASE) 50 MCG/ACT nasal spray Place 1 spray into both nostrils daily. Patient taking differently: Place 2 sprays into both nostrils daily.  02/21/17   Pleas Koch, NP  furosemide (LASIX) 40 MG tablet Take 1 tablet (40 mg total) by mouth daily. 04/25/17 07/24/17  Wellington Hampshire, MD  glimepiride (AMARYL) 4 MG tablet TAKE 2 TABLETS BY MOUTH ONCE DAILY WITH  BREAKFAST 09/17/17   Pleas Koch, NP  lisinopril (PRINIVIL,ZESTRIL) 40 MG tablet Take 1 tablet (40 mg total) by mouth daily. 05/10/17 08/08/17  Wellington Hampshire, MD  Melatonin 10 MG TABS Take 10 mg by mouth at bedtime.    [provider]  Omega-3 1000 MG CAPS Take 1,000 mg by mouth daily.    [provider]  omeprazole (PRILOSEC) 40 MG capsule TAKE 1 CAPSULE BY MOUTH ONCE DAILY 07/15/17   Pleas Koch, NP  oxybutynin (DITROPAN) 5 MG tablet TAKE 1 TABLET BY MOUTH THREE TIMES DAILY 09/17/17   Pleas Koch, NP  triamcinolone cream (KENALOG) 0.1 % Apply 1 application topically 2 (two) times daily. Patient taking differently: Apply 1 application topically daily as needed (rash).  08/30/16   Pleas Koch, NP    REVIEW OF SYSTEMS:  Review of Systems  Constitutional: Negative for chills, fever, malaise/fatigue and weight loss.  HENT: Negative for ear pain, hearing loss and tinnitus.   Eyes: Negative for blurred vision, double vision, pain and redness.  Respiratory: Negative for cough, hemoptysis and shortness of breath.   Cardiovascular: Negative for chest pain, palpitations, orthopnea and leg swelling.  Gastrointestinal: Negative for abdominal pain, constipation, diarrhea, nausea and vomiting.  Genitourinary: Negative for dysuria, frequency and hematuria.  Musculoskeletal: Negative for back pain, joint pain and neck pain.  Skin:       No acne, rash, or lesions  Neurological: Positive for dizziness and speech change. Negative for tremors, focal weakness and weakness.   Endo/Heme/Allergies: Negative for polydipsia. Does not bruise/bleed easily.  Psychiatric/Behavioral: Negative for depression. The patient is not nervous/anxious and does not have insomnia.      VITAL SIGNS:   Vitals:   12/18/17 0016 12/18/17 0030 12/18/17 0100 12/18/17 0202  BP: (!) 200/87 (!) 215/73 (!) 171/96 (!) 189/119  Pulse: 80 78 76 74  Resp: 15 19 (!) 21 15  Temp:      TempSrc:      SpO2: 95% 96% 97% 98%  Weight:      Height:       Wt Readings from Last 3 Encounters:  12/17/17 136.1 kg (300 lb)  06/20/17 125.9 kg (277 lb 8 oz)  05/29/17 126 kg (277 lb 12.8 oz)    PHYSICAL EXAMINATION:  Physical Exam  Vitals reviewed. Constitutional: She is oriented to person, place, and time. She appears well-developed and well-nourished. No distress.  HENT:  Head: Normocephalic and atraumatic.  Mouth/Throat: Oropharynx is clear and moist.  Eyes: Conjunctivae and EOM are normal. Pupils are equal, round, and reactive to light. No scleral icterus.  Neck: Normal range of motion. Neck supple. No JVD present. No thyromegaly present.  Cardiovascular: Normal rate, regular rhythm and intact distal pulses. Exam reveals no gallop and no friction rub.  Murmur heard. Respiratory: Effort normal and breath sounds normal. No respiratory distress. She has no wheezes. She has no rales.  GI: Soft. Bowel sounds are normal. She exhibits no distension. There is no tenderness.  Musculoskeletal: Normal range of motion. She exhibits no edema.  No arthritis, no gout  Lymphadenopathy:    She has no cervical adenopathy.  Neurological: She is alert and oriented to person, place, and time. No cranial nerve deficit.  Neurologic: Cranial nerves II-XII intact, Sensation intact to light touch/pinprick, 5/5 strength in all extremities, no dysarthria, expressive aphasia present, no dysphagia, memory intact, finger to nose testing showed no abnormality, no pronator drift, DTR intact, Babinski sign not present.    Skin: Skin is warm and dry. No rash noted. No erythema.  Psychiatric: She has a normal mood and affect. Her behavior is normal. Judgment and thought content normal.    LABORATORY PANEL:   CBC Recent Labs  Lab 12/17/17 2347  WBC 6.6  HGB 13.3  HCT 37.9  PLT 170   ------------------------------------------------------------------------------------------------------------------  Chemistries  Recent Labs  Lab 12/17/17 2347  NA 135  K 4.1  CL 98*  CO2 29  GLUCOSE 404*  BUN 31*  CREATININE 1.60*  CALCIUM 9.2  AST 20  ALT 13*  ALKPHOS 75  BILITOT 0.6   ------------------------------------------------------------------------------------------------------------------  Cardiac Enzymes Recent Labs  Lab 12/17/17 2347  TROPONINI <0.03   ------------------------------------------------------------------------------------------------------------------  RADIOLOGY:  Ct Angio Head W Or Wo Contrast  Result Date: 12/18/2017 CLINICAL DATA:  Initial evaluation for acute expressive aphasia, vertical nystagmus. EXAM: CT ANGIOGRAPHY HEAD AND NECK. TECHNIQUE: Multidetector CT imaging of the head and neck was performed using the standard protocol during bolus administration of intravenous contrast. Multiplanar CT image reconstructions and MIPs were obtained to evaluate the vascular anatomy. Carotid stenosis measurements (when applicable) are obtained utilizing NASCET criteria, using the distal internal carotid diameter as the denominator. CONTRAST:  26m ISOVUE-370 IOPAMIDOL (ISOVUE-370) INJECTION 76% COMPARISON:  Prior CT from 10/10/2016. FINDINGS: CT HEAD FINDINGS Brain: Cerebral atrophy with chronic small vessel ischemic disease, progressed relative to 2018. Small remote subcortical right frontal infarct noted. No acute intracranial hemorrhage. No acute large vessel territory infarct. No mass lesion, midline shift or mass effect. No hydrocephalus. Inter caval cyst noted, stable from  previous. No extra-axial fluid collection. Vascular: No hyperdense vessel. Calcified atherosclerosis present at the skull base. Skull: Scalp soft tissues and calvarium within normal limits. Sinuses: Visualized paranasal sinuses are clear. Small left mastoid effusion noted. Orbits: Globes orbital soft tissues within normal limits. Review of the MIP images confirms the above findings CTA NECK FINDINGS Aortic arch: Visualized aortic arch of normal caliber with normal 3 vessel morphology. No flow-limiting stenosis about the origin of the great vessels. Visualized subclavian arteries widely patent. Right carotid system: Right common carotid artery tortuous proximally but widely patent to the bifurcation without stenosis. Right carotid artery system medialized into the retropharyngeal space. Mixed plaque about the proximal right ICA with associated stenosis of up to 45% by NASCET criteria.  Right ICA widely patent distally to the skull base without stenosis, dissection, or occlusion. Left carotid system: Left common carotid artery patent from its origin to the bifurcation without stenosis. Left carotid artery system medialized into the retropharyngeal space. Calcified plaque at the left bifurcation with associated stenosis of approximately 60% by NASCET criteria. Distally, left ICA widely patent to the skull base without stenosis, dissection, or occlusion. Vertebral arteries: Both of the vertebral arteries arise from the subclavian arteries. Focal plaque at the origin of the right vertebral artery without significant stenosis. Vertebral arteries patent within the neck without stenosis, dissection, or occlusion. Skeleton: No acute osseous abnormality identified. Advanced degenerative spondylolysis present at C4-5 through C6-7. Advanced facet arthrosis present throughout the cervical spine, worse on the right. Other neck: No acute soft tissue abnormality within the neck. Salivary glands normal. Subcentimeter hypodense right  thyroid nodule noted, of doubtful significance. Thyroid otherwise unremarkable. No adenopathy. Upper chest: Visualized upper chest demonstrates no acute abnormality. Visualized lungs are clear. Review of the MIP images confirms the above findings CTA HEAD FINDINGS Anterior circulation: Petrous segments patent bilaterally. Scattered atheromatous plaque within the cavernous/supraclinoid ICAs with resultant mild to moderate multifocal narrowing, worse on the right. No high-grade stenosis. ICA termini widely patent. A1 segments patent bilaterally. Normal anterior communicating artery. Anterior cerebral arteries patent to their distal aspects without stenosis. M1 segments patent without stenosis. No proximal M2 occlusion. Prominent atheromatous irregularity throughout the MCA branches bilaterally with several prominent severe distal M3 stenoses, seen on the left on series 10, image 88), seen on the right on series 10, image 80). Posterior circulation: Atheromatous plaque within the bilateral V4 segments with resultant mild to moderate stenoses, left slightly worse than right. Left vertebral artery slightly dominant. Left PICA patent. Right PICA not well visualized. Basilar artery widely patent to its distal aspect without stenosis. Superior cerebral arteries patent bilaterally. Both of the posterior cerebral arteries primarily supplied via the basilar. Moderate to severe distal left P2 stenosis noted (series 10, image 98). There are severe tandem right P2 stenoses. Distal small vessel atheromatous irregularity. Venous sinuses: Patent. Anatomic variants: None significant.  No intracranial aneurysm. Delayed phase: Not performed. Review of the MIP images confirms the above findings IMPRESSION: 1. Negative CTA for emergent large vessel occlusion. No acute intracranial abnormality identified. 2. 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 3. Moderate to advanced intracranial atherosclerotic changes above with multiple prominent severe bilateral distal MCA branch stenoses, with severe bilateral P2 stenoses. No proximal high-grade or correctable stenosis identified. Results were called by telephone at the time of interpretation on 12/18/2017 at 12:41 am to Dr. Marjean Donna , who verbally acknowledged these results. Electronically Signed   By: Jeannine Boga M.D.   On: 12/18/2017 01:01   Ct Angio Neck W And/or Wo Contrast  Result Date: 12/18/2017 CLINICAL DATA:  Initial evaluation for acute expressive aphasia, vertical nystagmus. EXAM: CT ANGIOGRAPHY HEAD AND NECK. TECHNIQUE: Multidetector CT imaging of the head and neck was performed using the standard protocol during bolus administration of intravenous contrast. Multiplanar CT image reconstructions and MIPs were obtained to evaluate the vascular anatomy. Carotid stenosis measurements (when applicable) are obtained utilizing NASCET criteria, using the distal internal carotid diameter as the denominator. CONTRAST:  69m ISOVUE-370 IOPAMIDOL (ISOVUE-370) INJECTION 76% COMPARISON:  Prior CT from 10/10/2016. FINDINGS: CT HEAD FINDINGS Brain: Cerebral atrophy with chronic small vessel ischemic disease, progressed relative to 2018. Small  remote subcortical right frontal infarct noted. No acute intracranial hemorrhage. No acute large vessel territory infarct. No mass lesion, midline shift or mass effect. No hydrocephalus. 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 No extra-axial fluid collection. Vascular: No hyperdense vessel. Calcified atherosclerosis present at the skull base. Skull: Scalp soft tissues and calvarium within normal limits. Sinuses: Visualized paranasal sinuses are clear. Small left mastoid effusion noted. Orbits: Globes orbital soft tissues within normal limits. Review of the MIP images confirms the above findings CTA NECK FINDINGS Aortic arch: Visualized aortic arch of  normal caliber with normal 3 vessel morphology. No flow-limiting stenosis about the origin of the great vessels. Visualized subclavian arteries widely patent. Right carotid system: Right common carotid artery tortuous proximally but widely patent to the bifurcation without stenosis. Right carotid artery system medialized into the retropharyngeal space. Mixed plaque about the proximal right ICA with associated stenosis of up to 45% by NASCET criteria. Right ICA widely patent distally to the skull base without stenosis, dissection, or occlusion. Left carotid system: Left common carotid artery patent from its origin to the bifurcation without stenosis. Left carotid artery system medialized into the retropharyngeal space. Calcified plaque at the left bifurcation with associated stenosis of approximately 60% by NASCET criteria. Distally, left ICA widely patent to the skull base without stenosis, dissection, or occlusion. Vertebral arteries: Both of the vertebral arteries arise from the subclavian arteries. Focal plaque at the origin of the right vertebral artery without significant stenosis. Vertebral arteries patent within the neck without stenosis, dissection, or occlusion. Skeleton: No acute osseous abnormality identified. Advanced degenerative spondylolysis present at C4-5 through C6-7. Advanced facet arthrosis present throughout the cervical spine, worse on the right. Other neck: No acute soft tissue abnormality within the neck. Salivary glands normal. Subcentimeter hypodense right thyroid nodule noted, of doubtful significance. Thyroid otherwise unremarkable. No adenopathy. Upper chest: Visualized upper chest demonstrates no acute abnormality. Visualized lungs are clear. Review of the MIP images confirms the above findings CTA HEAD FINDINGS Anterior circulation: Petrous segments patent bilaterally. Scattered atheromatous plaque within the cavernous/supraclinoid ICAs with resultant mild to moderate multifocal  narrowing, worse on the right. No high-grade stenosis. ICA termini widely patent. A1 segments patent bilaterally. Normal anterior communicating artery. Anterior cerebral arteries patent to their distal aspects without stenosis. M1 segments patent without stenosis. No proximal M2 occlusion. Prominent atheromatous irregularity throughout the MCA branches bilaterally with several prominent severe distal M3 stenoses, seen on the left on series 10, image 88), seen on the right on series 10, image 80). Posterior circulation: Atheromatous plaque within the bilateral V4 segments with resultant mild to moderate stenoses, left slightly worse than right. Left vertebral artery slightly dominant. Left PICA patent. Right PICA not well visualized. Basilar artery widely patent to its distal aspect without stenosis. Superior cerebral arteries patent bilaterally. Both of the posterior cerebral arteries primarily supplied via the basilar. Moderate to severe distal left P2 stenosis noted (series 10, image 98). There are severe tandem right P2 stenoses. Distal small vessel atheromatous irregularity. Venous sinuses: Patent. Anatomic variants: None significant.  No intracranial aneurysm. Delayed phase: Not performed. Review of the MIP images confirms the above findings IMPRESSION: 1. Negative CTA for emergent large vessel occlusion. No acute intracranial abnormality identified. 2. 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 3. Moderate to advanced intracranial atherosclerotic changes above with multiple prominent severe bilateral distal MCA branch stenoses, with severe bilateral P2 stenoses. No proximal  high-grade or correctable stenosis identified. Results were called by telephone at the time of interpretation on 12/18/2017 at 12:41 am to Dr. Marjean Donna , who verbally acknowledged these results. Electronically Signed   By: Jeannine Boga M.D.   On: 12/18/2017 01:01    EKG:   Orders placed or performed during the hospital encounter of 12/17/17  . ED EKG  . ED EKG    IMPRESSION AND PLAN:  Principal Problem:   Expressive aphasia -patient is still experiencing this on this writer's interview.  Highly concerning for stroke.  We will get an MRI and other pertinent labs and imaging per stroke admission order set, to include neurology consult Active Problems:   Essential hypertension -permissive hypertension.  Blood pressure goal less than 220/120 for the first 24 hours after onset of symptoms.  Hold home meds   Type 2 diabetes mellitus with complication, without long-term current use of insulin ([place]) -continue home meds   Hyperlipidemia -continue home medications   Depression -home dose antidepressants   GERD (gastroesophageal reflux disease) -home dose PPI  Chart review performed and case discussed with ED provider. Labs, imaging and/or ECG reviewed by provider and discussed with patient/family. Management plans discussed with the patient and/or family.  DVT PROPHYLAXIS: SubQ lovenox  GI PROPHYLAXIS: PPI  ADMISSION STATUS: Observation  CODE STATUS: Full Code Status History    Date Active Date Inactive Code Status Order ID Comments User Context   04/25/2017 10:33 04/25/2017 15:48 Full Code [id number]  Wellington Hampshire, MD Inpatient      TOTAL TIME TAKING CARE OF THIS PATIENT: 40 minutes.   [place] 12/18/2017, 3:31 AM  [facility]  Office  [phone]  CC: Primary care physician; Pleas Koch, NP  Note:  This document was prepared using Dragon voice recognition software and may include unintentional dictation errors.

## 2017-12-18 NOTE — Evaluation (Signed)
Occupational Therapy Evaluation Patient Details Name: Megan Obrien MRN: [id number] DOB: [birth year] Today's Date: 12/18/2017    History of Present Illness 72 y.o. female who presents with dizziness and expressive aphasia. Admitted under observation status for stroke work up. MRI pending.   Clinical Impression   Pt seen for OT evaluation this date. Prior to hospital admission, pt was modified independent with most ADL, ambulating short household distances with a standard walker, and endorses multiple falls in past year. Pt's friend in room endorses that sometimes she needs assist for transfers at home. Pt lives alone with her cat in a 2nd floor apartment with an Media planner. Pt has family/friends come by daily but not overnight. Pt endorses not always taking her medications as prescribed because she gets tired of taking them or is forgetful.  Currently pt demonstrates impairments in activity tolerance, strength globally, expressive communication difficulties, chronic bilateral knee pain, and with high falls risk all impacting her ability to perform mobility and self care tasks, requiring CGA for bed mobility, CGA to min assist for transfers and side stepping along bed with standard walker, min-mod assist for LB ADL tasks.  Pt instructed in falls prevention strategies to minimize falls risk. Pt would benefit from skilled OT to address noted impairments and functional limitations (see below for any additional details).  Upon hospital discharge, recommend pt discharge to home with [facility] and assist from family/friends.     Follow Up Recommendations  Home health OT;Supervision - Intermittent    Equipment Recommendations  None recommended by OT    Recommendations for Other Services       Precautions / Restrictions Precautions Precautions: Fall Restrictions Weight Bearing Restrictions: No      Mobility Bed Mobility Overal bed mobility: Needs Assistance Bed Mobility: Supine to Sit;Sit to  Supine     Supine to sit: Min guard;HOB elevated Sit to supine: Min guard   General bed mobility comments: additional time/effort to perform, min guard for BLE mgt  Transfers Overall transfer level: Needs assistance Equipment used: Standard walker Transfers: Sit to/from Stand Sit to Stand: Min assist;Mod assist;From elevated surface         General transfer comment: initial min-mod, decreasing to CGA to min assist for transfers with bed elevated slightly and verbal cues for hand placement on EOB to improve bringing COG over BOS    Balance Overall balance assessment: Needs assistance;History of Falls Sitting-balance support: Bilateral upper extremity supported Sitting balance-Leahy Scale: Good     Standing balance support: Bilateral upper extremity supported Standing balance-Leahy Scale: Fair                             ADL either performed or assessed with clinical judgement   ADL Overall ADL's : Needs assistance/impaired Eating/Feeding: Sitting;Independent   Grooming: Sitting;Independent   Upper Body Bathing: Sitting;Minimal assistance   Lower Body Bathing: Sit to/from stand;Moderate assistance;Minimal assistance   Upper Body Dressing : Sitting;Min guard   Lower Body Dressing: Sit to/from stand;Moderate assistance   Toilet Transfer: Ambulation;Minimal Patent examiner Details (indicate cue type and reason): SW and min assist for transfer to [place] and Hygiene: Sit to/from stand;Supervision/safety               Vision Baseline Vision/History: Wears glasses Wears Glasses: At all times;Reading only Patient Visual Report: No change from baseline Vision Assessment?: No apparent visual deficits     Perception     Praxis  Pertinent Vitals/Pain Pain Assessment: 0-10 Pain Score: 5  Pain Location: bilat knee pain (chronic) Pain Descriptors / Indicators: Aching Pain Intervention(s): Limited  activity within patient's tolerance;Monitored during session;Repositioned;Premedicated before session     Hand Dominance Right   Extremity/Trunk Assessment Upper Extremity Assessment Upper Extremity Assessment: Generalized weakness(grossly 4/5 bilaterally, neuropathy in hands at baseline, no new sensory deficits, intact coordination)   Lower Extremity Assessment Lower Extremity Assessment: Defer to PT evaluation;Generalized weakness(grossly at least 4-/5 bilaterally, neuropathy at baseline, no new sensory deficits)   Cervical / Trunk Assessment Cervical / Trunk Assessment: Normal   Communication Communication Communication: Expressive difficulties   Cognition Arousal/Alertness: Awake/alert Behavior During Therapy: WFL for tasks assessed/performed Overall Cognitive Status: Within Functional Limits for tasks assessed                                     General Comments       Exercises Other Exercises Other Exercises: pt/friend instructed in falls prevention strategies and proper foot wear as pt endorses wearing "slippery house slippers" that may have contributed to at least 1 fall.    Shoulder Instructions      Home Living Family/patient expects to be discharged to:: Private residence Living Arrangements: Alone Available Help at Discharge: Family;Friend(s);Available PRN/intermittently;Available 24 hours/day(friend could be there 24/7 short term if needed) Type of Home: Apartment(2nd floor) Home Access: Elevator     Home Layout: One level     Bathroom Shower/Tub: Walk-in shower;Tub/shower unit(uses garden tub w/ TTB)   Bathroom Toilet: Standard     Home Equipment: Walker - standard;Tub bench;Wheelchair - manual          Prior Functioning/Environment Level of Independence: Needs assistance  Gait / Transfers Assistance Needed: Pt ambulating <50 w/ SW, pt/friend endorse she sometimes needs help with transfers, uses w/c or motorized chair for grocery  shopping ADL's / Homemaking Assistance Needed: Pt modified indep with seated bath, dressing (difficulty with reaching feet so unable to perform toenail care, wears slip on house slippers), indep with light meal prep, friend comes x2/wk to clean house, drives pt, and helps with shopping. Pt endorses being forgetful with medications or not taking them all the time, stating "I hate taking them all the time."   Comments: When asked about falls in past year, pt states "Good heavens, yes! All I do is fall" secondary to loss of balance, per pt report        OT Problem List: Decreased strength;Decreased knowledge of use of DME or AE;Decreased activity tolerance;Decreased safety awareness;Impaired balance (sitting and/or standing);Obesity      OT Treatment/Interventions: Self-care/ADL training;Balance training;Therapeutic exercise;Therapeutic activities;DME and/or AE instruction;Patient/family education;Cognitive remediation/compensation    OT Goals(Current goals can be found in the care plan section) Acute Rehab OT Goals Patient Stated Goal: go home and work on my health OT Goal Formulation: With patient Time For Goal Achievement: 01/01/18 Potential to Achieve Goals: Good ADL Goals Pt Will Perform Upper Body Dressing: with caregiver independent in assisting;sitting Pt Will Perform Lower Body Dressing: sit to/from stand;with modified independence;with adaptive equipment Pt Will Transfer to Toilet: with supervision;bedside commode;ambulating(SW for amb, bari BSC) Pt/caregiver will Perform Home Exercise Program: Left upper extremity;Increased ROM;With written HEP provided;With Supervision;With minimal assist Additional ADL Goal #1: Pt will perform elbow, wrist, and hand ROM exercises independently  OT Frequency: Min 2X/week   Barriers to D/C:  Co-evaluation              AM-PAC PT "6 Clicks" Daily Activity     Outcome Measure Help from another person eating meals?: None Help  from another person taking care of personal grooming?: None Help from another person toileting, which includes using toliet, bedpan, or urinal?: A Little Help from another person bathing (including washing, rinsing, drying)?: A Lot Help from another person to put on and taking off regular upper body clothing?: A Little Help from another person to put on and taking off regular lower body clothing?: A Lot 6 Click Score: 18   End of Session Equipment Utilized During Treatment: Rolling walker;Gait belt  Activity Tolerance: Patient tolerated treatment well Patient left: in bed;with call bell/phone within reach;with bed alarm set;Other (comment)(with PT in room to assess)  OT Visit Diagnosis: Other abnormalities of gait and mobility (R26.89);Repeated falls (R29.6);Muscle weakness (generalized) (M62.81);Cognitive communication deficit (R41.841) Symptoms and signs involving cognitive functions: Cerebral infarction                Time: 1586-8257 OT Time Calculation (min): 48 min Charges:  OT General Charges $OT Visit: 1 Visit OT Evaluation $OT Eval Moderate Complexity: 1 Mod OT Treatments $Self Care/Home Management : 23-37 mins  Jeni Salles, MPH, MS, OTR/L ascom [phone] 12/18/17, 11:17 AM

## 2017-12-18 NOTE — Progress Notes (Addendum)
Inpatient Diabetes Program Recommendations  AACE/ADA: New Consensus Statement on Inpatient Glycemic Control (2015)  Target Ranges:  Prepandial:   less than 140 mg/dL      Peak postprandial:   less than 180 mg/dL (1-2 hours)      Critically ill patients:  140 - 180 mg/dL   Lab Results  Component Value Date   GLUCAP 387 (H) 12/18/2017   HGBA1C 8.6 (H) 04/17/2017    Review of Glycemic Control Results for Megan Obrien, Megan Obrien (MRN [id number]) as of 12/18/2017 11:03  Ref. Range 12/18/2017 07:40  Glucose-Capillary Latest Ref Range: 65 - 99 mg/dL 387 (H)   Diabetes history: Type 2 DM Outpatient Diabetes medications: Amaryl 8 mg daily Current orders for Inpatient glycemic control:  Novolog sensitive tid with meals and HS  Inpatient Diabetes Program Recommendations:    Blood sugars>goal. Based on current CBG, patient may need basal insulin while in the hospital. -Please consider adding Lantus 18 units daily (0.15 units/kg) -check HgBA1C.   Will follow. Thanks,  Adah Perl, RN, BC-ADM Inpatient Diabetes Coordinator Pager [phone] (8a-5p)  Addendum: [phone]- spoke with patient regarding elevated A1C of 11.7%.  She states that her blood sugars run between 200-400's at home.  We discussed possibility of her being on insulin at d/c.  She is agreeable but states "I need to learn how to do it".  Discussed with RN to allow patient to begin practicing insulin preparation and administration.  We did review hypoglycemia signs and symptoms and how to treat.  She state that her husband had diabetes and she remembers him "needing to eat" when his blood sugar was low.  Will need close follow-up with PCP. Will follow up on 3/21.

## 2017-12-18 NOTE — Evaluation (Signed)
Physical Therapy Evaluation Patient Details Name: Megan Obrien MRN: [id number] DOB: [birth year] Today's Date: 12/18/2017   History of Present Illness  72 y.o. female who presents with dizziness and expressive aphasia. Admitted under observation status for stroke work up. MRI pending.  PMH includes heart failure, CKD, htn, OSA, PAH, DM, back and knee surgery.  Clinical Impression  Prior to hospital admission, pt was ambulating short distances with SW in home (used w/c or electric scooter in community) and sometimes requires assist with transfers/standing.  Pt endorses that she falls a lot at baseline.  Pt lives alone in 2nd floor apt with elevator access.  Currently pt is min to mod assist with standing with SW and CGA ambulating 12 feet x2 with SW (antalgic gait d/t R>L knee pain; limited distance d/t R knee pain but steady).  Overall pt steady with SW once standing.  Pt would benefit from skilled PT to address noted impairments and functional limitations (see below for any additional details).  Per pt and pt's granddaughters report, pt appears close to baseline in terms of assist levels and functional mobility.  Upon hospital discharge, recommend pt discharge to home with assist with transfers/functional mobility as needed; also recommend HHPT d/t h/o multiple falls.    Follow Up Recommendations Home health PT;Supervision for mobility/OOB    Equipment Recommendations  Standard walker    Recommendations for Other Services       Precautions / Restrictions Precautions Precautions: Fall Restrictions Weight Bearing Restrictions: No      Mobility  Bed Mobility Overal bed mobility: Needs Assistance Bed Mobility: Supine to Sit;Sit to Supine     Supine to sit: Supervision;HOB elevated Sit to supine: Supervision;HOB elevated   General bed mobility comments: increased time/effort to perform on own  Transfers Overall transfer level: Needs assistance Equipment used: Standard  walker Transfers: Sit to/from Stand Sit to Stand: Min assist;Mod assist         General transfer comment: x2 trials; assist to initiate stand up to SW (pt likes to pull up with hand hold assist to stand--pt's granddaughter reports this is her baseline technique)  Ambulation/Gait Ambulation/Gait assistance: Min guard Ambulation Distance (Feet): (12 feet x2 (ambulating around bed)) Assistive device: Standard walker Gait Pattern/deviations: Step-to pattern Gait velocity: decreased   General Gait Details: antalgic; decreased stance time R LE (d/t R>L knee pain); limited distance d/t R knee pain  Stairs            Wheelchair Mobility    Modified Rankin (Stroke Patients Only)       Balance Overall balance assessment: Needs assistance;History of Falls Sitting-balance support: No upper extremity supported;Feet supported Sitting balance-Leahy Scale: Good Sitting balance - Comments: steady sitting reaching within BOS   Standing balance support: Bilateral upper extremity supported Standing balance-Leahy Scale: Poor Standing balance comment: requires B UE support for ambulation                             Pertinent Vitals/Pain Pain Assessment: 0-10 Pain Score: 5  Pain Location: bilat knee pain (chronic) Pain Intervention(s): Limited activity within patient's tolerance;Monitored during session;Repositioned  Beginning of session BP 177/85, HR 69 bpm, and O2 97% on room air. End of session BP 167/85, HR 71 bpm, and O2 100% on room air.    Home Living Family/patient expects to be discharged to:: Private residence Living Arrangements: Alone Available Help at Discharge: Family;Friend(s);Available PRN/intermittently;Available 24 hours/day(Friend could be there 24/7 if needed)  Type of Home: Apartment(2nd floor) Home Access: Elevator     Home Layout: One level Home Equipment: Walker - standard;Tub bench;Wheelchair - manual      Prior Function Level of  Independence: Needs assistance   Gait / Transfers Assistance Needed: Pt ambulating <50 feet w/ SW, pt/granddaughter endorse she sometimes needs help with transfers, uses w/c or motorized chair for grocery shopping  ADL's / Homemaking Assistance Needed: Per OT "Pt modified indep with seated bath, dressing (difficulty with reaching feet so unable to perform toenail care, wears slip on house slippers), indep with light meal prep, friend comes x2/wk to clean house, drives pt, and helps with shopping. Pt endorses being forgetful with medications or not taking them all the time, stating "I hate taking them all the time.""  Comments: Pt reports h/o multiple falls.  Per OT "When asked about falls in past year, pt states "Good heavens, yes! All I do is fall" secondary to loss of balance, per pt report".     Hand Dominance   Dominant Hand: Right    Extremity/Trunk Assessment   Upper Extremity Assessment Upper Extremity Assessment: (see OT note for details)    Lower Extremity Assessment Lower Extremity Assessment: LLE deficits/detail;RLE deficits/detail(h/o peripheral neuropathy; B LE tone intact; difficulty with B heel to shin); at least 3/5 AROM B hip flexion, knee flexion/extension, and DF. LLE Deficits / Details: decreased proprioception L great toe    Cervical / Trunk Assessment Cervical / Trunk Assessment: Normal  Communication   Communication: Expressive difficulties  Cognition Arousal/Alertness: Awake/alert Behavior During Therapy: WFL for tasks assessed/performed Overall Cognitive Status: Within Functional Limits for tasks assessed                                        General Comments General comments (skin integrity, edema, etc.): pt resting in bed upon PT entry; pt's granddaughter and then pt's son arrived during session.  Nursing cleared pt for participation in physical therapy.  Pt agreeable to PT session.    Exercises     Assessment/Plan    PT Assessment  Patient needs continued PT services  PT Problem List Decreased strength;Decreased activity tolerance;Decreased balance;Decreased mobility;Pain       PT Treatment Interventions DME instruction;Gait training;Functional mobility training;Therapeutic activities;Therapeutic exercise;Balance training;Patient/family education    PT Goals (Current goals can be found in the Care Plan section)  Acute Rehab PT Goals Patient Stated Goal: to go home PT Goal Formulation: With patient Time For Goal Achievement: 01/01/18 Potential to Achieve Goals: Good    Frequency 7X/week   Barriers to discharge        Co-evaluation               AM-PAC PT "6 Clicks" Daily Activity  Outcome Measure Difficulty turning over in bed (including adjusting bedclothes, sheets and blankets)?: A Little Difficulty moving from lying on back to sitting on the side of the bed? : A Lot Difficulty sitting down on and standing up from a chair with arms (e.g., wheelchair, bedside commode, etc,.)?: Unable Help needed moving to and from a bed to chair (including a wheelchair)?: A Lot Help needed walking in hospital room?: A Little Help needed climbing 3-5 steps with a railing? : Total 6 Click Score: 12    End of Session Equipment Utilized During Treatment: Gait belt Activity Tolerance: Patient limited by pain(chronic R>L knee pain) Patient left: in bed;with call  bell/phone within reach;with bed alarm set;with family/visitor present Nurse Communication: Mobility status;Precautions PT Visit Diagnosis: Other abnormalities of gait and mobility (R26.89);Muscle weakness (generalized) (M62.81);History of falling (Z91.81);Repeated falls (R29.6);Difficulty in walking, not elsewhere classified (R26.2)    Time: 8350-7573 PT Time Calculation (min) (ACUTE ONLY): 54 min   Charges:   PT Evaluation $PT Eval Low Complexity: 1 Low PT Treatments $Therapeutic Activity: 8-22 mins   PT G CodesLeitha Bleak,  PT 12/18/17, 3:30 PM [phone]

## 2017-12-18 NOTE — Consult Note (Addendum)
   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 Thalamic stroke.  CTA H/N is negatieve. - Will start ASA, Lipid Panel, A1c.     Differential Diagnosis:   1. Cardioembolic stroke  2. Small vessel disease/lacune  3. Thromboembolic, artery-to-artery mechanism  4. Hypercoagulable state-related infarct  5. Transient ischemic attack  6. Thrombotic mechanism, large artery disease   Comments:   [facility] contacted: 1147 am [facility] at bedside: 1153 am initiated call with Ed, Md NIHSS assessment time: 1210 am  Recommendations:  Inpatient neurology consultation Inpatient stroke evaluation as per Neurology/ Internal Medicine Discussed with ED MD Please call with questions  -----------------------------------------------------------------------------------------  CC  History of Present Illness   Acute onset, started on awakening and going to the restroom, and acute dizziness, and persisted to the point and continues to be dizzy with word finding dififculty. She states she was last normal at about 5 pm.   Diagnostic:   CT head is negative.   Vitals:   12/17/17 2348  Pulse: 70  Resp: 20  Temp: 98.3 F (36.8 C)  TempSrc: Oral  SpO2: 97%  Weight: 136.1 kg (300 lb)  Height: 5\' 6"  (1.676 m)    Exam:  LOC x 3 - 0 VF - 2 - left sided hemianopai FD - 0 HG - 0 UE -0 LE - @ baseline they are weak but antigravity 2,2  Aphasia - 1 Sensory - 0 Dysarthria - 0 Extinction - 0      Medical Decision Making:  - Extensive number of diagnosis or management options are considered above.   - Extensive amount of complex data reviewed.   - High risk of  complication and/or morbidity or mortality are associated with differential diagnostic considerations above.  - There may be  Uncertain outcome and increased probability of prolonged functional impairment or high probability of severe prolonged functional impairment associated with some of these differential diagnosis.  Medical Data Reviewed:  1.Data reviewed include clinical labs, radiology,  Medical Tests;   2.Tests results discussed w/performing or interpreting physician;   3.Obtain ing/reviewing old medical records;  4.Obtaining case history from another source;  5.Independent review of image, tracing or specimen.    Patient was informed the Neurology Consult would happen via telehealth (remote video) and consented to receiving care in this manner.

## 2017-12-18 NOTE — Progress Notes (Signed)
Megan Obrien at [name] NAME: Megan Obrien    MR#:  [id number]  DATE OF BIRTH:  10/12/1945  SUBJECTIVE:  CHIEF COMPLAINT:   Chief Complaint  Patient presents with  . Dizziness   Came with speech difficulty, still have the symptoms. Denies headache or any other symptoms.   REVIEW OF SYSTEMS:  CONSTITUTIONAL: No fever, fatigue or weakness.  EYES: No blurred or double vision.  EARS, NOSE, AND THROAT: No tinnitus or ear pain.  RESPIRATORY: No cough, shortness of breath, wheezing or hemoptysis.  CARDIOVASCULAR: No chest pain, orthopnea, edema.  GASTROINTESTINAL: No nausea, vomiting, diarrhea or abdominal pain.  GENITOURINARY: No dysuria, hematuria.  ENDOCRINE: No polyuria, nocturia,  HEMATOLOGY: No anemia, easy bruising or bleeding SKIN: No rash or lesion. MUSCULOSKELETAL: No joint pain or arthritis.   NEUROLOGIC: No tingling, numbness, weakness.  PSYCHIATRY: No anxiety or depression.   ROS  DRUG ALLERGIES:  No Known Allergies  VITALS:  Blood pressure (!) 167/85, pulse 71, temperature 98 F (36.7 C), temperature source Oral, resp. rate 20, height 5\' 6"  (1.676 m), weight 123.3 kg (271 lb 12.8 oz), SpO2 100 %.  PHYSICAL EXAMINATION:  GENERAL:  72 y.o.-year-old patient lying in the bed with no acute distress.  EYES: Pupils equal, round, reactive to light and accommodation. No scleral icterus. Extraocular muscles intact.  HEENT: Head atraumatic, normocephalic. Oropharynx and nasopharynx clear.  NECK:  Supple, no jugular venous distention. No thyroid enlargement, no tenderness.  LUNGS: Normal breath sounds bilaterally, no wheezing, rales,rhonchi or crepitation. No use of accessory muscles of respiration.  CARDIOVASCULAR: S1, S2 normal. No murmurs, rubs, or gallops.  ABDOMEN: Soft, nontender, nondistended. Bowel sounds present. No organomegaly or mass.  EXTREMITIES: No pedal edema, cyanosis, or clubbing.  NEUROLOGIC: Cranial nerves II  through XII are intact. Muscle strength 4/5 in all extremities. Sensation intact. Gait not checked.  PSYCHIATRIC: The patient is alert and oriented x 3.  SKIN: No obvious rash, lesion, or ulcer.   Physical Exam LABORATORY PANEL:   CBC Recent Labs  Lab 12/18/17 0642  WBC 9.4  HGB 12.5  HCT 36.0  PLT 173   ------------------------------------------------------------------------------------------------------------------  Chemistries  Recent Labs  Lab 12/17/17 2347 12/18/17 0642  NA 135  --   K 4.1  --   CL 98*  --   CO2 29  --   GLUCOSE 404*  --   BUN 31*  --   CREATININE 1.60* 1.57*  CALCIUM 9.2  --   AST 20  --   ALT 13*  --   ALKPHOS 75  --   BILITOT 0.6  --    ------------------------------------------------------------------------------------------------------------------  Cardiac Enzymes Recent Labs  Lab 12/17/17 2347  TROPONINI <0.03   ------------------------------------------------------------------------------------------------------------------  RADIOLOGY:  Ct Angio Head W Or Wo Contrast  Result Date: 12/18/2017 CLINICAL DATA:  Initial evaluation for acute expressive aphasia, vertical nystagmus. EXAM: CT ANGIOGRAPHY HEAD AND NECK. TECHNIQUE: Multidetector CT imaging of the head and neck was performed using the standard protocol during bolus administration of intravenous contrast. Multiplanar CT image reconstructions and MIPs were obtained to evaluate the vascular anatomy. Carotid stenosis measurements (when applicable) are obtained utilizing NASCET criteria, using the distal internal carotid diameter as the denominator. CONTRAST:  16mL ISOVUE-370 IOPAMIDOL (ISOVUE-370) INJECTION 76% COMPARISON:  Prior CT from 10/10/2016. FINDINGS: CT HEAD FINDINGS Brain: Cerebral atrophy with chronic small vessel ischemic disease, progressed relative to 2018. Small remote subcortical right frontal infarct noted. No acute intracranial hemorrhage. No acute large  vessel territory  infarct. No mass lesion, midline shift or mass effect. No hydrocephalus. 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 No extra-axial fluid collection. Vascular: No hyperdense vessel. Calcified atherosclerosis present at the skull base. Skull: Scalp soft tissues and calvarium within normal limits. Sinuses: Visualized paranasal sinuses are clear. Small left mastoid effusion noted. Orbits: Globes orbital soft tissues within normal limits. Review of the MIP images confirms the above findings CTA NECK FINDINGS Aortic arch: Visualized aortic arch of normal caliber with normal 3 vessel morphology. No flow-limiting stenosis about the origin of the great vessels. Visualized subclavian arteries widely patent. Right carotid system: Right common carotid artery tortuous proximally but widely patent to the bifurcation without stenosis. Right carotid artery system medialized into the retropharyngeal space. Mixed plaque about the proximal right ICA with associated stenosis of up to 45% by NASCET criteria. Right ICA widely patent distally to the skull base without stenosis, dissection, or occlusion. Left carotid system: Left common carotid artery patent from its origin to the bifurcation without stenosis. Left carotid artery system medialized into the retropharyngeal space. Calcified plaque at the left bifurcation with associated stenosis of approximately 60% by NASCET criteria. Distally, left ICA widely patent to the skull base without stenosis, dissection, or occlusion. Vertebral arteries: Both of the vertebral arteries arise from the subclavian arteries. Focal plaque at the origin of the right vertebral artery without significant stenosis. Vertebral arteries patent within the neck without stenosis, dissection, or occlusion. Skeleton: No acute osseous abnormality identified. Advanced degenerative spondylolysis present at C4-5 through C6-7. Advanced facet arthrosis present throughout the cervical spine, worse on the right. Other  neck: No acute soft tissue abnormality within the neck. Salivary glands normal. Subcentimeter hypodense right thyroid nodule noted, of doubtful significance. Thyroid otherwise unremarkable. No adenopathy. Upper chest: Visualized upper chest demonstrates no acute abnormality. Visualized lungs are clear. Review of the MIP images confirms the above findings CTA HEAD FINDINGS Anterior circulation: Petrous segments patent bilaterally. Scattered atheromatous plaque within the cavernous/supraclinoid ICAs with resultant mild to moderate multifocal narrowing, worse on the right. No high-grade stenosis. ICA termini widely patent. A1 segments patent bilaterally. Normal anterior communicating artery. Anterior cerebral arteries patent to their distal aspects without stenosis. M1 segments patent without stenosis. No proximal M2 occlusion. Prominent atheromatous irregularity throughout the MCA branches bilaterally with several prominent severe distal M3 stenoses, seen on the left on series 10, image 88), seen on the right on series 10, image 80). Posterior circulation: Atheromatous plaque within the bilateral V4 segments with resultant mild to moderate stenoses, left slightly worse than right. Left vertebral artery slightly dominant. Left PICA patent. Right PICA not well visualized. Basilar artery widely patent to its distal aspect without stenosis. Superior cerebral arteries patent bilaterally. Both of the posterior cerebral arteries primarily supplied via the basilar. Moderate to severe distal left P2 stenosis noted (series 10, image 98). There are severe tandem right P2 stenoses. Distal small vessel atheromatous irregularity. Venous sinuses: Patent. Anatomic variants: None significant.  No intracranial aneurysm. Delayed phase: Not performed. Review of the MIP images confirms the above findings IMPRESSION: 1. Negative CTA for emergent large vessel occlusion. No acute intracranial abnormality identified. 2. Atheromatous stenoses  about the carotid bifurcations bilaterally with associated 45% narrowing on the right and 60% narrowing on the left. 3. Moderate to advanced intracranial atherosclerotic changes above with multiple prominent severe bilateral distal MCA branch stenoses, with severe bilateral P2 stenoses. No proximal high-grade or correctable stenosis identified. Results were called by telephone at the  time of interpretation on 12/18/2017 at 12:41 am to Dr. Marjean Donna , who verbally acknowledged these results. Electronically Signed   By: Jeannine Boga M.D.   On: 12/18/2017 01:01   Ct Angio Neck W And/or Wo Contrast  Result Date: 12/18/2017 CLINICAL DATA:  Initial evaluation for acute expressive aphasia, vertical nystagmus. EXAM: CT ANGIOGRAPHY HEAD AND NECK. TECHNIQUE: Multidetector CT imaging of the head and neck was performed using the standard protocol during bolus administration of intravenous contrast. Multiplanar CT image reconstructions and MIPs were obtained to evaluate the vascular anatomy. Carotid stenosis measurements (when applicable) are obtained utilizing NASCET criteria, using the distal internal carotid diameter as the denominator. CONTRAST:  64mL ISOVUE-370 IOPAMIDOL (ISOVUE-370) INJECTION 76% COMPARISON:  Prior CT from 10/10/2016. FINDINGS: CT HEAD FINDINGS Brain: Cerebral atrophy with chronic small vessel ischemic disease, progressed relative to 2018. Small remote subcortical right frontal infarct noted. No acute intracranial hemorrhage. No acute large vessel territory infarct. No mass lesion, midline shift or mass effect. No hydrocephalus. 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 No extra-axial fluid collection. Vascular: No hyperdense vessel. Calcified atherosclerosis present at the skull base. Skull: Scalp soft tissues and calvarium within normal limits. Sinuses: Visualized paranasal sinuses are clear. Small left mastoid effusion noted. Orbits: Globes orbital soft tissues within normal limits.  Review of the MIP images confirms the above findings CTA NECK FINDINGS Aortic arch: Visualized aortic arch of normal caliber with normal 3 vessel morphology. No flow-limiting stenosis about the origin of the great vessels. Visualized subclavian arteries widely patent. Right carotid system: Right common carotid artery tortuous proximally but widely patent to the bifurcation without stenosis. Right carotid artery system medialized into the retropharyngeal space. Mixed plaque about the proximal right ICA with associated stenosis of up to 45% by NASCET criteria. Right ICA widely patent distally to the skull base without stenosis, dissection, or occlusion. Left carotid system: Left common carotid artery patent from its origin to the bifurcation without stenosis. Left carotid artery system medialized into the retropharyngeal space. Calcified plaque at the left bifurcation with associated stenosis of approximately 60% by NASCET criteria. Distally, left ICA widely patent to the skull base without stenosis, dissection, or occlusion. Vertebral arteries: Both of the vertebral arteries arise from the subclavian arteries. Focal plaque at the origin of the right vertebral artery without significant stenosis. Vertebral arteries patent within the neck without stenosis, dissection, or occlusion. Skeleton: No acute osseous abnormality identified. Advanced degenerative spondylolysis present at C4-5 through C6-7. Advanced facet arthrosis present throughout the cervical spine, worse on the right. Other neck: No acute soft tissue abnormality within the neck. Salivary glands normal. Subcentimeter hypodense right thyroid nodule noted, of doubtful significance. Thyroid otherwise unremarkable. No adenopathy. Upper chest: Visualized upper chest demonstrates no acute abnormality. Visualized lungs are clear. Review of the MIP images confirms the above findings CTA HEAD FINDINGS Anterior circulation: Petrous segments patent bilaterally.  Scattered atheromatous plaque within the cavernous/supraclinoid ICAs with resultant mild to moderate multifocal narrowing, worse on the right. No high-grade stenosis. ICA termini widely patent. A1 segments patent bilaterally. Normal anterior communicating artery. Anterior cerebral arteries patent to their distal aspects without stenosis. M1 segments patent without stenosis. No proximal M2 occlusion. Prominent atheromatous irregularity throughout the MCA branches bilaterally with several prominent severe distal M3 stenoses, seen on the left on series 10, image 88), seen on the right on series 10, image 80). Posterior circulation: Atheromatous plaque within the bilateral V4 segments with resultant mild to moderate stenoses, left slightly worse than  right. Left vertebral artery slightly dominant. Left PICA patent. Right PICA not well visualized. Basilar artery widely patent to its distal aspect without stenosis. Superior cerebral arteries patent bilaterally. Both of the posterior cerebral arteries primarily supplied via the basilar. Moderate to severe distal left P2 stenosis noted (series 10, image 98). There are severe tandem right P2 stenoses. Distal small vessel atheromatous irregularity. Venous sinuses: Patent. Anatomic variants: None significant.  No intracranial aneurysm. Delayed phase: Not performed. Review of the MIP images confirms the above findings IMPRESSION: 1. Negative CTA for emergent large vessel occlusion. No acute intracranial abnormality identified. 2. 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 3. Moderate to advanced intracranial atherosclerotic changes above with multiple prominent severe bilateral distal MCA branch stenoses, with severe bilateral P2 stenoses. No proximal high-grade or correctable stenosis identified. Results were called by telephone at the time of interpretation on 12/18/2017 at 12:41 am  to Dr. Marjean Donna , who verbally acknowledged these results. Electronically Signed   By: Jeannine Boga M.D.   On: 12/18/2017 01:01   Mr Brain Wo Contrast  Result Date: 12/18/2017 CLINICAL DATA:  Dizziness and expressive aphasia EXAM: MRI HEAD WITHOUT CONTRAST MRA HEAD WITHOUT CONTRAST TECHNIQUE: Multiplanar, multiecho pulse sequences of the brain and surrounding structures were obtained without intravenous contrast. Angiographic images of the head were obtained using MRA technique without contrast. COMPARISON:  CTA head neck 12/17/2017 FINDINGS: MRI HEAD FINDINGS Brain: Incidentally noted is a large development or positive cyst. There is multifocal diffusion restriction involving multiple vascular territories. Largest areas of diffusion abnormality are in the right frontal lobe and left parietal lobe. Smaller areas include the right parietal lobe, right hippocampus and the periphery of the left postcentral gyrus. Diffuse confluent white matter hyperintensity consistent with chronic microvascular ischemia. No mass lesion. Focus of chronic microhemorrhage in the left parietal lobe. No hydrocephalus, age advanced atrophy or lobar predominant volume loss. No dural abnormality or extra-axial collection. Skull and upper cervical spine: The visualized skull base, calvarium, upper cervical spine and extracranial soft tissues are normal. Sinuses/Orbits: No fluid levels or advanced mucosal thickening. No mastoid effusion. Normal orbits. MRA HEAD FINDINGS Intracranial internal carotid arteries: Mild narrowing of the right cavernous segment. Anterior cerebral arteries: Normal. Normal variant hypoplastic right A1 segment. Patent anterior communicating artery. Middle cerebral arteries: Multifocal mild-to-moderate narrowing of both middle cerebral arteries, which is exaggerated by patient motion. Posterior communicating arteries: Absent bilaterally. Posterior cerebral arteries: Multifocal moderate stenosis of the  right P2 segment. Moderate-to-severe left P2 segment stenosis. Basilar artery: Indication Vertebral arteries: Left dominant. Normal. Superior cerebellar arteries: Normal. Anterior inferior cerebellar arteries: Normal. Posterior inferior cerebellar arteries: Below the field of view. IMPRESSION: 1. Multifocal acute to early subacute ischemia within multiple vascular territories, the largest areas of which are in the right frontal and left parietal lobes. Additional of smaller foci of ischemia within the right hippocampus, right parietal lobe and within the left peripheral postcentral gyrus. 2. No hemorrhage or mass effect. 3. Severe chronic microvascular ischemia. 4. Moderate-to-severe stenosis of both posterior cerebral artery P2 segments. 5. Multifocal narrowing of the middle cerebral arteries is exaggerated by patient motion. No proximal occlusion or proximal high-grade stenosis. Electronically Signed   By: Ulyses Jarred M.D.   On: 12/18/2017 16:09   Mr Jodene Nam Head/brain NA Cm  Result Date: 12/18/2017 CLINICAL DATA:  Dizziness and expressive aphasia EXAM: MRI HEAD WITHOUT CONTRAST MRA HEAD WITHOUT CONTRAST TECHNIQUE: Multiplanar,  multiecho pulse sequences of the brain and surrounding structures were obtained without intravenous contrast. Angiographic images of the head were obtained using MRA technique without contrast. COMPARISON:  CTA head neck 12/17/2017 FINDINGS: MRI HEAD FINDINGS Brain: Incidentally noted is a large development or positive cyst. There is multifocal diffusion restriction involving multiple vascular territories. Largest areas of diffusion abnormality are in the right frontal lobe and left parietal lobe. Smaller areas include the right parietal lobe, right hippocampus and the periphery of the left postcentral gyrus. Diffuse confluent white matter hyperintensity consistent with chronic microvascular ischemia. No mass lesion. Focus of chronic microhemorrhage in the left parietal lobe. No  hydrocephalus, age advanced atrophy or lobar predominant volume loss. No dural abnormality or extra-axial collection. Skull and upper cervical spine: The visualized skull base, calvarium, upper cervical spine and extracranial soft tissues are normal. Sinuses/Orbits: No fluid levels or advanced mucosal thickening. No mastoid effusion. Normal orbits. MRA HEAD FINDINGS Intracranial internal carotid arteries: Mild narrowing of the right cavernous segment. Anterior cerebral arteries: Normal. Normal variant hypoplastic right A1 segment. Patent anterior communicating artery. Middle cerebral arteries: Multifocal mild-to-moderate narrowing of both middle cerebral arteries, which is exaggerated by patient motion. Posterior communicating arteries: Absent bilaterally. Posterior cerebral arteries: Multifocal moderate stenosis of the right P2 segment. Moderate-to-severe left P2 segment stenosis. Basilar artery: Indication Vertebral arteries: Left dominant. Normal. Superior cerebellar arteries: Normal. Anterior inferior cerebellar arteries: Normal. Posterior inferior cerebellar arteries: Below the field of view. IMPRESSION: 1. Multifocal acute to early subacute ischemia within multiple vascular territories, the largest areas of which are in the right frontal and left parietal lobes. Additional of smaller foci of ischemia within the right hippocampus, right parietal lobe and within the left peripheral postcentral gyrus. 2. No hemorrhage or mass effect. 3. Severe chronic microvascular ischemia. 4. Moderate-to-severe stenosis of both posterior cerebral artery P2 segments. 5. Multifocal narrowing of the middle cerebral arteries is exaggerated by patient motion. No proximal occlusion or proximal high-grade stenosis. Electronically Signed   By: Ulyses Jarred M.D.   On: 12/18/2017 16:09    ASSESSMENT AND PLAN:   Principal Problem:   Expressive aphasia Active Problems:   Hyperlipidemia   Essential hypertension   Type 2 diabetes  mellitus with complication, without long-term current use of insulin (HCC)   Depression   GERD (gastroesophageal reflux disease)    * Expressive aphasia -patient is still experiencing this      Acute stroke   Awaited Echo report.   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   HBA1c is high- advised and started on lantus   LDL is high- started on statin.   SLP eval and therapy after discharge  * DM   Hold oral meds, start on lantus  * Hyperlipidemia   Start atorvastatin.  * hypertension   Have multiple intracranial stenosis   She will benefit from slightly higher blood pressure, wills top BP meds.      All the records are reviewed and case discussed with Care Management/Social Workerr. Management plans discussed with the patient, family and they are in agreement.  CODE STATUS: Full.  TOTAL TIME TAKING CARE OF THIS PATIENT: 35 minutes.    POSSIBLE D/C IN 1-2 DAYS, DEPENDING ON CLINICAL CONDITION.   Vaughan Basta M.D on 12/18/2017   Between 7am to 6pm - Pager - [phone]  After 6pm  go to [email or web address] - password [email or web address] [facility]  Office  [phone]  CC: Primary care physician; Pleas Koch, NP  Note: This dictation was prepared with Dragon dictation along with smaller phrase technology. Any transcriptional errors that result from this process are unintentional.

## 2017-12-18 NOTE — ED Notes (Signed)
Patient transported to 106

## 2017-12-18 NOTE — Progress Notes (Signed)
SLP Cancellation Note  Patient Details Name: LARYN VENNING MRN: [id number] DOB: 08/13/1946   Cancelled treatment:       Reason Eval/Treat Not Completed: Patient at procedure or test/unavailable(chart reviewed; pt at MRI/MRA. Will f/u tomorrow.)   Orinda Kenner, MS, CCC-SLP [facility] 12/18/2017, 3:49 PM

## 2017-12-18 NOTE — Consult Note (Signed)
Referring Physician: Anselm Jungling    Chief Complaint: Difficulty with speech  HPI: Megan Obrien is an 72 y.o. female with multiple medical problems who reports that yesterday while talking to daughter had the acute onset of difficulty with speech and dizziness.  Patient felt that her symptoms would improve but when they did not presented for evaluation.  Reports that her speech is still impaired and at times has problems finding words.   Initial NIHSS of 1   Patient reports that she has had multiple episodes of difficulty with speech in the past but they have all been related to a fall.  Date last known well: Date: 12/17/2017 Time last known well: Time: 17:00 tPA Given: No: Outside time window  Past Medical History:  Diagnosis Date  . (HFpEF) heart failure with preserved ejection fraction ([place])    a. 03/2017 Echo: EF 55-60%, no rwma, Gr1 DD, mild to mod MS, mod to sev dil LA.  [name] Arthritis   . CKD (chronic kidney disease), stage III ([place])   . Depression   . GERD (gastroesophageal reflux disease)   . Hyperlipidemia   . Hypertension   . Mitral stenosis    a. Mild to moderate by echo 03/2017; b. 03/2017 R/LHC: PCWP 80mHg, LVEDP 146mg. Mean grad of 58m20m. Valve area of 2.02cm^2.  . Morbid obesity ([place] . [place]   . Non-obstructive CAD (coronary artery disease)    a. 03/2017 Cath: mild, non-obstructive CAD.  . OMarland KitchenA (obstructive sleep apnea)    a. Previously wore CPAP for several years but stopped on her own and now just uses O2 via [employer] @ night.  . PMarland KitchenH (pulmonary artery hypertension) ([place]  a. 03/2017 RHC: PA 47m50m  . Seasonal allergies   . Type 2 diabetes mellitus (HCC)[place]. Urinary incontinence     Past Surgical History:  Procedure Laterality Date  . BACK SURGERY    . CARDIAC CATHETERIZATION    . KNEE SURGERY Left   . RIGHT/LEFT HEART CATH AND CORONARY ANGIOGRAPHY Bilateral 04/25/2017   Procedure: Right/Left Heart Cath and Coronary Angiography;  Surgeon: AridWellington Hampshire;   [facility];  Service: Cardiovascular;  Laterality: Bilateral;  . TONSILLECTOMY AND ADENOIDECTOMY  1952    Family History  Problem Relation Age of Onset  . Lung cancer Father   . Alzheimer's disease Mother   . Stroke Maternal Grandfather    Social History:  reports that  has never smoked. she has never used smokeless tobacco. She reports that she does not drink alcohol or use drugs.  Allergies: No Known Allergies  Medications:  I have reviewed the patient's current medications. Prior to Admission:  Medications Prior to Admission  Medication Sig Dispense Refill Last Dose  . ACCU-CHEK AVIVA PLUS test strip USE ONE STRIP TO CHECK GLUCOSE ONCE TO TWICE DAILY AND AS DIRECTED 200 each 5 12/17/2017 at Unknown time  . atorvastatin (LIPITOR) 40 MG tablet Take 1 tablet (40 mg total) by mouth daily. 90 tablet 1 12/17/2017 at Unknown time  . Blood Glucose Monitoring Suppl (ACCU-CHEK AVIVA PLUS) w/Device KIT Use as directed to check blood sugar 2 times daily. 1 kit 0 12/17/2017 at Unknown time  . buPROPion (WELLBUTRIN SR) 150 MG 12 hr tablet TAKE 1 TABLET BY MOUTH TWICE DAILY 180 tablet 0 12/17/2017 at Unknown time  . carvedilol (COREG) 12.5 MG tablet Take 1.5 tablets (18.75 mg total) by mouth 2 (two) times daily. 135 tablet 3 12/17/2017 at  Unknown time  . Cholecalciferol (VITAMIN D3) 5000 units CAPS Take 5,000 Units by mouth daily.   12/17/2017 at Unknown time  . ferrous sulfate 325 (65 FE) MG tablet Take 325 mg by mouth daily.   12/17/2017 at Unknown time  . FLUoxetine (PROZAC) 40 MG capsule TAKE 1 CAPSULE BY MOUTH ONCE DAILY 90 capsule 0 12/17/2017 at Unknown time  . fluticasone (FLONASE) 50 MCG/ACT nasal spray Place 1 spray into both nostrils daily. (Patient taking differently: Place 2 sprays into both nostrils daily. ) 48 g 1 Past Week at Unknown time  . furosemide (LASIX) 40 MG tablet Take 1 tablet (40 mg total) by mouth daily. 90 tablet 3 12/17/2017 at Unknown time  . glimepiride  (AMARYL) 4 MG tablet TAKE 2 TABLETS BY MOUTH ONCE DAILY WITH  BREAKFAST 180 tablet 0 12/17/2017 at Unknown time  . lisinopril (PRINIVIL,ZESTRIL) 40 MG tablet Take 1 tablet (40 mg total) by mouth daily. 90 tablet 3 12/17/2017 at Unknown time  . Melatonin 10 MG TABS Take 10 mg by mouth at bedtime.   Past Week at Unknown time  . Omega-3 1000 MG CAPS Take 1,000 mg by mouth daily.   12/17/2017 at Unknown time  . omeprazole (PRILOSEC) 40 MG capsule TAKE 1 CAPSULE BY MOUTH ONCE DAILY 90 capsule 1 12/17/2017 at Unknown time  . oxybutynin (DITROPAN) 5 MG tablet TAKE 1 TABLET BY MOUTH THREE TIMES DAILY 270 tablet 0 12/17/2017 at Unknown time  . acetaminophen (TYLENOL) 650 MG CR tablet Take 1,300-1,950 mg by mouth daily as needed for pain.   prn at prn  . triamcinolone cream (KENALOG) 0.1 % Apply 1 application topically 2 (two) times daily. (Patient taking differently: Apply 1 application topically daily as needed (rash). ) 30 g 0 prn at prn   Scheduled: . atorvastatin  40 mg Oral Daily  . buPROPion  150 mg Oral BID  . FLUoxetine  40 mg Oral Daily  . heparin  5,000 Units Subcutaneous Q8H  . insulin aspart  0-5 Units Subcutaneous QHS  . insulin aspart  0-9 Units Subcutaneous TID WC  . oxybutynin  5 mg Oral TID  . pantoprazole  40 mg Oral Daily    ROS: History obtained from the patient  General ROS: negative for - chills, fatigue, fever, night sweats, weight gain or weight loss Psychological ROS: negative for - behavioral disorder, hallucinations, memory difficulties, mood swings or suicidal ideation Ophthalmic ROS: negative for - blurry vision, double vision, eye pain or loss of vision ENT ROS: dizziness Allergy and Immunology ROS: negative for - hives or itchy/watery eyes Hematological and Lymphatic ROS: negative for - bleeding problems, bruising or swollen lymph nodes Endocrine ROS: negative for - galactorrhea, hair pattern changes, polydipsia/polyuria or temperature intolerance Respiratory ROS:  negative for - cough, hemoptysis, shortness of breath or wheezing Cardiovascular ROS: negative for - chest pain, dyspnea on exertion, edema or irregular heartbeat Gastrointestinal ROS: negative for - abdominal pain, diarrhea, hematemesis, nausea/vomiting or stool incontinence Genito-Urinary ROS: negative for - dysuria, hematuria, incontinence or urinary frequency/urgency Musculoskeletal ROS: right leg pain Neurological ROS: as noted in HPI Dermatological ROS: negative for rash and skin lesion changes  Physical Examination: Blood pressure (!) 182/107, pulse 67, temperature 98 F (36.7 C), temperature source Oral, resp. rate 20, height '5\' 6"'$  (1.676 m), weight 123.3 kg (271 lb 12.8 oz), SpO2 98 %.  HEENT-  Normocephalic, no lesions, without obvious abnormality.  Normal external eye and conjunctiva.  Normal TM's bilaterally.  Normal auditory canals  and external ears. Normal external nose, mucus membranes and septum.  Normal pharynx. Cardiovascular- S1, S2 normal, pulses palpable throughout   Lungs- chest clear, no wheezing, rales, normal symmetric air entry Abdomen- soft, non-tender; bowel sounds normal; no masses,  no organomegaly Extremities- no edema Lymph-no adenopathy palpable Musculoskeletal-no joint tenderness, deformity or swelling Skin-warm and dry, no hyperpigmentation, vitiligo, or suspicious lesions  Neurological Examination   Mental Status: Alert, oriented, thought content appropriate.  Speech fluent without evidence of aphasia.  Able to follow 3 step commands without difficulty. Cranial Nerves: II: Discs flat bilaterally; Visual fields grossly normal, pupils equal, round, reactive to light and accommodation III,IV, VI: ptosis not present, extra-ocular motions intact bilaterally V,VII: smile symmetric, facial light touch sensation normal bilaterally VIII: hearing normal bilaterally IX,X: gag reflex present XI: bilateral shoulder shrug XII: midline tongue  extension Motor: Right : Upper extremity   5/5    Left:     Upper extremity   5/5  Lower extremity   Limited strength 2/2 pain  Lower extremity   5/5 Tone and bulk:normal tone throughout; no atrophy noted Sensory: Pinprick and light touch intact throughout, bilaterally Deep Tendon Reflexes: 2+ in the upper extremities and absent in the lower extremities Plantars: Right: mute   Left: mute Cerebellar: Normal finger-to-nose and normal heel-to-shin testing bilaterally Gait: not tested due to safety concerns   Laboratory Studies:  Basic Metabolic Panel: Recent Labs  Lab 12/17/17 2347 12/18/17 0642  NA 135  --   K 4.1  --   CL 98*  --   CO2 29  --   GLUCOSE 404*  --   BUN 31*  --   CREATININE 1.60* 1.57*  CALCIUM 9.2  --     Liver Function Tests: Recent Labs  Lab 12/17/17 2347  AST 20  ALT 13*  ALKPHOS 75  BILITOT 0.6  PROT 7.4  ALBUMIN 3.4*   No results for input(s): LIPASE, AMYLASE in the last 168 hours. No results for input(s): AMMONIA in the last 168 hours.  CBC: Recent Labs  Lab 12/17/17 2347 12/18/17 0642  WBC 6.6 9.4  NEUTROABS 4.8  --   HGB 13.3 12.5  HCT 37.9 36.0  MCV 89.1 89.5  PLT 170 173    Cardiac Enzymes: Recent Labs  Lab 12/17/17 2347  TROPONINI <0.03    BNP: Invalid input(s): POCBNP  CBG: Recent Labs  Lab 12/18/17 0740 12/18/17 1155  GLUCAP 387* 369*    Microbiology: Results for orders placed or performed in visit on 07/24/16  Urine culture     Status: None   Collection Time: 07/24/16  1:11 PM  Result Value Ref Range Status   Culture KLEBSIELLA PNEUMONIAE  Final   Colony Count 50,000-100,000 CFU/mL  Final   Organism ID, Bacteria KLEBSIELLA PNEUMONIAE  Final      Susceptibility   Klebsiella pneumoniae -  (no method available)    AMPICILLIN >=32 Resistant     AMOX/CLAVULANIC <=2 Sensitive     AMPICILLIN/SULBACTAM 4 Sensitive     PIP/TAZO 8 Sensitive     IMIPENEM <=0.25 Sensitive     CEFAZOLIN <=4 Not Reportable      CEFTRIAXONE <=1 Sensitive     CEFTAZIDIME <=1 Sensitive     CEFEPIME <=1 Sensitive     GENTAMICIN <=1 Sensitive     TOBRAMYCIN <=1 Sensitive     CIPROFLOXACIN <=0.25 Sensitive     LEVOFLOXACIN <=0.12 Sensitive     NITROFURANTOIN 64 Intermediate     TRIMETH/SULFA* <=20  Sensitive      * NR=NOT REPORTABLE,SEE COMMENTORAL therapy:A cefazolin MIC of <32 predicts susceptibility to the oral agents cefaclor,cefdinir,cefpodoxime,cefprozil,cefuroxime,cephalexin,and loracarbef when used for therapy of uncomplicated UTIs due to E.coli,K.pneumomiae,and P.mirabilis. PARENTERAL therapy: A cefazolinMIC of >8 indicates resistance to parenteralcefazolin. An alternate test method must beperformed to confirm susceptibility to parenteralcefazolin.    Coagulation Studies: Recent Labs    12/17/17 2347  LABPROT 12.9  INR 0.98    Urinalysis:  Recent Labs  Lab 12/18/17 0128  COLORURINE STRAW*  LABSPEC 1.010  PHURINE 8.0  GLUCOSEU >=500*  HGBUR MODERATE*  BILIRUBINUR NEGATIVE  KETONESUR NEGATIVE  PROTEINUR 100*  NITRITE NEGATIVE  LEUKOCYTESUR TRACE*    Lipid Panel:    Component Value Date/Time   CHOL 231 (H) 12/18/2017 0642   TRIG 213 (H) 12/18/2017 0642   HDL 43 12/18/2017 0642   CHOLHDL 5.4 12/18/2017 0642   VLDL 43 (H) 12/18/2017 0642   LDLCALC 145 (H) 12/18/2017 0642    HgbA1C:  Lab Results  Component Value Date   HGBA1C 11.4 (H) 12/18/2017    Urine Drug Screen:      Component Value Date/Time   LABOPIA NONE DETECTED 12/18/2017 0128   COCAINSCRNUR NONE DETECTED 12/18/2017 0128   LABBENZ NONE DETECTED 12/18/2017 0128   AMPHETMU NONE DETECTED 12/18/2017 0128   THCU NONE DETECTED 12/18/2017 0128   LABBARB NONE DETECTED 12/18/2017 0128    Alcohol Level:  Recent Labs  Lab 12/17/17 2347  ETH <10     Imaging: Ct Angio Head W Or Wo Contrast  Result Date: 12/18/2017 CLINICAL DATA:  Initial evaluation for acute expressive aphasia, vertical nystagmus. EXAM: CT ANGIOGRAPHY HEAD  AND NECK. TECHNIQUE: Multidetector CT imaging of the head and neck was performed using the standard protocol during bolus administration of intravenous contrast. Multiplanar CT image reconstructions and MIPs were obtained to evaluate the vascular anatomy. Carotid stenosis measurements (when applicable) are obtained utilizing NASCET criteria, using the distal internal carotid diameter as the denominator. CONTRAST:  28m ISOVUE-370 IOPAMIDOL (ISOVUE-370) INJECTION 76% COMPARISON:  Prior CT from 10/10/2016. FINDINGS: CT HEAD FINDINGS Brain: Cerebral atrophy with chronic small vessel ischemic disease, progressed relative to 2018. Small remote subcortical right frontal infarct noted. No acute intracranial hemorrhage. No acute large vessel territory infarct. No mass lesion, midline shift or mass effect. No hydrocephalus. 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 No extra-axial fluid collection. Vascular: No hyperdense vessel. Calcified atherosclerosis present at the skull base. Skull: Scalp soft tissues and calvarium within normal limits. Sinuses: Visualized paranasal sinuses are clear. Small left mastoid effusion noted. Orbits: Globes orbital soft tissues within normal limits. Review of the MIP images confirms the above findings CTA NECK FINDINGS Aortic arch: Visualized aortic arch of normal caliber with normal 3 vessel morphology. No flow-limiting stenosis about the origin of the great vessels. Visualized subclavian arteries widely patent. Right carotid system: Right common carotid artery tortuous proximally but widely patent to the bifurcation without stenosis. Right carotid artery system medialized into the retropharyngeal space. Mixed plaque about the proximal right ICA with associated stenosis of up to 45% by NASCET criteria. Right ICA widely patent distally to the skull base without stenosis, dissection, or occlusion. Left carotid system: Left common carotid artery patent from its origin to the bifurcation  without stenosis. Left carotid artery system medialized into the retropharyngeal space. Calcified plaque at the left bifurcation with associated stenosis of approximately 60% by NASCET criteria. Distally, left ICA widely patent to the skull base without stenosis, dissection, or occlusion.  Vertebral arteries: Both of the vertebral arteries arise from the subclavian arteries. Focal plaque at the origin of the right vertebral artery without significant stenosis. Vertebral arteries patent within the neck without stenosis, dissection, or occlusion. Skeleton: No acute osseous abnormality identified. Advanced degenerative spondylolysis present at C4-5 through C6-7. Advanced facet arthrosis present throughout the cervical spine, worse on the right. Other neck: No acute soft tissue abnormality within the neck. Salivary glands normal. Subcentimeter hypodense right thyroid nodule noted, of doubtful significance. Thyroid otherwise unremarkable. No adenopathy. Upper chest: Visualized upper chest demonstrates no acute abnormality. Visualized lungs are clear. Review of the MIP images confirms the above findings CTA HEAD FINDINGS Anterior circulation: Petrous segments patent bilaterally. Scattered atheromatous plaque within the cavernous/supraclinoid ICAs with resultant mild to moderate multifocal narrowing, worse on the right. No high-grade stenosis. ICA termini widely patent. A1 segments patent bilaterally. Normal anterior communicating artery. Anterior cerebral arteries patent to their distal aspects without stenosis. M1 segments patent without stenosis. No proximal M2 occlusion. Prominent atheromatous irregularity throughout the MCA branches bilaterally with several prominent severe distal M3 stenoses, seen on the left on series 10, image 88), seen on the right on series 10, image 80). Posterior circulation: Atheromatous plaque within the bilateral V4 segments with resultant mild to moderate stenoses, left slightly worse than  right. Left vertebral artery slightly dominant. Left PICA patent. Right PICA not well visualized. Basilar artery widely patent to its distal aspect without stenosis. Superior cerebral arteries patent bilaterally. Both of the posterior cerebral arteries primarily supplied via the basilar. Moderate to severe distal left P2 stenosis noted (series 10, image 98). There are severe tandem right P2 stenoses. Distal small vessel atheromatous irregularity. Venous sinuses: Patent. Anatomic variants: None significant.  No intracranial aneurysm. Delayed phase: Not performed. Review of the MIP images confirms the above findings IMPRESSION: 1. Negative CTA for emergent large vessel occlusion. No acute intracranial abnormality identified. 2. 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 3. Moderate to advanced intracranial atherosclerotic changes above with multiple prominent severe bilateral distal MCA branch stenoses, with severe bilateral P2 stenoses. No proximal high-grade or correctable stenosis identified. Results were called by telephone at the time of interpretation on 12/18/2017 at 12:41 am to Dr. Marjean Donna , who verbally acknowledged these results. Electronically Signed   By: Jeannine Boga M.D.   On: 12/18/2017 01:01   Ct Angio Neck W And/or Wo Contrast  Result Date: 12/18/2017 CLINICAL DATA:  Initial evaluation for acute expressive aphasia, vertical nystagmus. EXAM: CT ANGIOGRAPHY HEAD AND NECK. TECHNIQUE: Multidetector CT imaging of the head and neck was performed using the standard protocol during bolus administration of intravenous contrast. Multiplanar CT image reconstructions and MIPs were obtained to evaluate the vascular anatomy. Carotid stenosis measurements (when applicable) are obtained utilizing NASCET criteria, using the distal internal carotid diameter as the denominator. CONTRAST:  62m ISOVUE-370  IOPAMIDOL (ISOVUE-370) INJECTION 76% COMPARISON:  Prior CT from 10/10/2016. FINDINGS: CT HEAD FINDINGS Brain: Cerebral atrophy with chronic small vessel ischemic disease, progressed relative to 2018. Small remote subcortical right frontal infarct noted. No acute intracranial hemorrhage. No acute large vessel territory infarct. No mass lesion, midline shift or mass effect. No hydrocephalus. 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 No extra-axial fluid collection. Vascular: No hyperdense vessel. Calcified atherosclerosis present at the skull base. Skull: Scalp soft tissues and calvarium within normal limits. Sinuses: Visualized paranasal sinuses are clear. Small left mastoid effusion noted.  Orbits: Globes orbital soft tissues within normal limits. Review of the MIP images confirms the above findings CTA NECK FINDINGS Aortic arch: Visualized aortic arch of normal caliber with normal 3 vessel morphology. No flow-limiting stenosis about the origin of the great vessels. Visualized subclavian arteries widely patent. Right carotid system: Right common carotid artery tortuous proximally but widely patent to the bifurcation without stenosis. Right carotid artery system medialized into the retropharyngeal space. Mixed plaque about the proximal right ICA with associated stenosis of up to 45% by NASCET criteria. Right ICA widely patent distally to the skull base without stenosis, dissection, or occlusion. Left carotid system: Left common carotid artery patent from its origin to the bifurcation without stenosis. Left carotid artery system medialized into the retropharyngeal space. Calcified plaque at the left bifurcation with associated stenosis of approximately 60% by NASCET criteria. Distally, left ICA widely patent to the skull base without stenosis, dissection, or occlusion. Vertebral arteries: Both of the vertebral arteries arise from the subclavian arteries. Focal plaque at the origin of the right vertebral artery  without significant stenosis. Vertebral arteries patent within the neck without stenosis, dissection, or occlusion. Skeleton: No acute osseous abnormality identified. Advanced degenerative spondylolysis present at C4-5 through C6-7. Advanced facet arthrosis present throughout the cervical spine, worse on the right. Other neck: No acute soft tissue abnormality within the neck. Salivary glands normal. Subcentimeter hypodense right thyroid nodule noted, of doubtful significance. Thyroid otherwise unremarkable. No adenopathy. Upper chest: Visualized upper chest demonstrates no acute abnormality. Visualized lungs are clear. Review of the MIP images confirms the above findings CTA HEAD FINDINGS Anterior circulation: Petrous segments patent bilaterally. Scattered atheromatous plaque within the cavernous/supraclinoid ICAs with resultant mild to moderate multifocal narrowing, worse on the right. No high-grade stenosis. ICA termini widely patent. A1 segments patent bilaterally. Normal anterior communicating artery. Anterior cerebral arteries patent to their distal aspects without stenosis. M1 segments patent without stenosis. No proximal M2 occlusion. Prominent atheromatous irregularity throughout the MCA branches bilaterally with several prominent severe distal M3 stenoses, seen on the left on series 10, image 88), seen on the right on series 10, image 80). Posterior circulation: Atheromatous plaque within the bilateral V4 segments with resultant mild to moderate stenoses, left slightly worse than right. Left vertebral artery slightly dominant. Left PICA patent. Right PICA not well visualized. Basilar artery widely patent to its distal aspect without stenosis. Superior cerebral arteries patent bilaterally. Both of the posterior cerebral arteries primarily supplied via the basilar. Moderate to severe distal left P2 stenosis noted (series 10, image 98). There are severe tandem right P2 stenoses. Distal small vessel  atheromatous irregularity. Venous sinuses: Patent. Anatomic variants: None significant.  No intracranial aneurysm. Delayed phase: Not performed. Review of the MIP images confirms the above findings IMPRESSION: 1. Negative CTA for emergent large vessel occlusion. No acute intracranial abnormality identified. 2. 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 3. Moderate to advanced intracranial atherosclerotic changes above with multiple prominent severe bilateral distal MCA branch stenoses, with severe bilateral P2 stenoses. No proximal high-grade or correctable stenosis identified. Results were called by telephone at the time of interpretation on 12/18/2017 at 12:41 am to Dr. Marjean Donna , who verbally acknowledged these results. Electronically Signed   By: Jeannine Boga M.D.   On: 12/18/2017 01:01    Assessment: 72 y.o. female with a history of multiple medical problems who presents with complaints of difficulty with speech and dizziness.  Reports that she continues to have some speech difficulties today but these are not noted on her current neurological examination.  Head CT reviewed and shows no acute changes.  MRI pending.  CTA shows no large vessel occlusion but severe bilateral MCA and PCA branch stenoses.  Left carotid with 60% stenosis and right carotid with 45%.   Echocardiogram pending.  A1c 11.4, LDL 145.    Stroke Risk Factors - diabetes mellitus, hyperlipidemia and hypertension  Plan: 1. MRI and echo pending 2. PT consult, OT consult, Speech consult 3. Prophylactic therapy-Antiplatelet med: Aspirin - dose '325mg'$  daily 4. Telemetry monitoring 5. Frequent neuro checks 6. Blood sugar control with target A1c <7.0 7. Aggressive lipid management with target LDL<70.   Alexis Goodell, MD Neurology [phone] 12/18/2017, 12:36 PM

## 2017-12-18 NOTE — Progress Notes (Signed)
*  PRELIMINARY RESULTS* Echocardiogram 2D Echocardiogram has been performed.  Megan Obrien 12/18/2017, 8:46 AM

## 2017-12-19 DIAGNOSIS — I639 Cerebral infarction, unspecified: Secondary | ICD-10-CM

## 2017-12-19 LAB — BASIC METABOLIC PANEL
Anion gap: 8 (ref 5–15)
BUN: 28 mg/dL — AB (ref 6–20)
CHLORIDE: 102 mmol/L (ref 101–111)
CO2: 27 mmol/L (ref 22–32)
Calcium: 8.7 mg/dL — ABNORMAL LOW (ref 8.9–10.3)
Creatinine, Ser: 1.46 mg/dL — ABNORMAL HIGH (ref 0.44–1.00)
GFR calc Af Amer: 40 mL/min — ABNORMAL LOW (ref >60)
GFR, EST NON AFRICAN AMERICAN: 34 mL/min — AB (ref >60)
GLUCOSE: 226 mg/dL — AB (ref 65–99)
Potassium: 3.7 mmol/L (ref 3.5–5.1)
Sodium: 137 mmol/L (ref 135–145)

## 2017-12-19 LAB — GLUCOSE, CAPILLARY
GLUCOSE-CAPILLARY: 138 mg/dL — AB (ref 65–99)
GLUCOSE-CAPILLARY: 240 mg/dL — AB (ref 65–99)
GLUCOSE-CAPILLARY: 269 mg/dL — AB (ref 65–99)
GLUCOSE-CAPILLARY: 157 mg/dL — AB (ref 65–99)

## 2017-12-19 MED ORDER — LISINOPRIL 20 MG PO TABS
20.0000 mg | ORAL_TABLET | Freq: Every day | ORAL | Status: DC
  Administered 2017-12-19 – 2017-12-20 (×2): 20 mg via ORAL
  Filled 2017-12-19 (×2): qty 1

## 2017-12-19 MED ORDER — ASPIRIN EC 325 MG PO TBEC
325.0000 mg | DELAYED_RELEASE_TABLET | Freq: Every day | ORAL | Status: DC
  Administered 2017-12-19 – 2017-12-20 (×2): 325 mg via ORAL
  Filled 2017-12-19 (×2): qty 1

## 2017-12-19 MED ORDER — CARVEDILOL 3.125 MG PO TABS
12.5000 mg | ORAL_TABLET | Freq: Two times a day (BID) | ORAL | Status: DC
  Administered 2017-12-19 – 2017-12-23 (×8): 12.5 mg via ORAL
  Filled 2017-12-19 (×8): qty 4

## 2017-12-19 NOTE — Progress Notes (Signed)
Inpatient Diabetes Program Recommendations  AACE/ADA: New Consensus Statement on Inpatient Glycemic Control (2015)  Target Ranges:  Prepandial:   less than 140 mg/dL      Peak postprandial:   less than 180 mg/dL (1-2 hours)      Critically ill patients:  140 - 180 mg/dL   Lab Results  Component Value Date   GLUCAP 240 (H) 12/19/2017   HGBA1C 11.4 (H) 12/18/2017    Review of Glycemic ControlResults for Megan Obrien, Megan Obrien (MRN [id number]) as of 12/19/2017 16:35  Ref. Range 12/18/2017 11:55 12/18/2017 16:56 12/18/2017 21:09 12/19/2017 07:41 12/19/2017 11:37  Glucose-Capillary Latest Ref Range: 65 - 99 mg/dL 369 (H) 194 (H) 232 (H) 138 (H) 240 (H)   Diabetes history: Type 2 DM Outpatient Diabetes medications:  Amaryl 4 mg daily Current orders for Inpatient glycemic control:  Novolog sensitive tid with meals and HS Amaryl 4 mg with breakfast Lantus 20 units daily Inpatient Diabetes Program Recommendations:   Consider adding Novolog 4 units tid with meals (while in the hospital).  Hopefully will only need once shot a day at d/c.  RN states that patient has done pretty well with insulin administration.  May be a great candidate for insulin pen for insulin administration for ease.  Will see patient on 3/22 to show her insulin pen.    Thanks  Adah Perl, RN, BC-ADM Inpatient Diabetes Coordinator Pager [phone] (8a-5p)

## 2017-12-19 NOTE — Consult Note (Signed)
   [facility] CM Inpatient Consult   12/19/2017  Megan Obrien October 23, 1945 [id number]    [facility] Care Management referral received for DM management. Hgb A1c is 11.7 per [facility] referral.   Spoke with [name]. She is alert and oriented. Explained [facility] Care Management program for DM education. [name] is agreeable and verbal consent received.   Explained that [facility] will not interfere or replace any other services.   [name] reports she lives alone. Denies concerns with medications or transportation. States her friends/ family assist with transportation if needed. Confirms Primary Care Provider is [name] NP with [facility].  Best contact number for [name] is [phone]. Mrs. Mcgriff endorses she will be new to insulin. States she prefers home visits  from [facility].  Made inpatient RNCM aware [facility] Care Management to follow post discharge.  Will refer to [facility] for DM management and education. PCP office Velora Heckler at [facility]) is listed as doing transition of care.    Marthenia Rolling, MSN-Ed, RN,BSN [facility] Liaison [phone]

## 2017-12-19 NOTE — Progress Notes (Signed)
Subjective: Patient without new complaints.    Objective: Current vital signs: BP (!) 174/72   Pulse 65   Temp 97.9 F (36.6 C) (Oral)   Resp 20   Ht 5\' 6"  (1.676 m)   Wt 123.3 kg (271 lb 12.8 oz)   SpO2 99%   BMI 43.87 kg/m  Vital signs in last 24 hours: Temp:  [97.9 F (36.6 C)-98.4 F (36.9 C)] 97.9 F (36.6 C) (03/21 0902) Pulse Rate:  [65-72] 65 (03/21 0902) Resp:  [19-20] 20 (03/21 0902) BP: (163-183)/(64-97) 174/72 (03/21 0902) SpO2:  [97 %-100 %] 99 % (03/21 0902)  Intake/Output from previous day: 03/20 0701 - 03/21 0700 In: 840 [P.O.:840] Out: -  Intake/Output this shift: No intake/output data recorded. Nutritional status: Fall precautions Diet heart healthy/carb modified Room service appropriate? Yes; Fluid consistency: Thin  Neurologic Exam: Mental Status: Alert, oriented, thought content appropriate.  Speech fluent without evidence of aphasia.  Able to follow 3 step commands without difficulty. Cranial Nerves: II: Discs flat bilaterally; Visual fields grossly normal, pupils equal, round, reactive to light and accommodation III,IV, VI: ptosis not present, extra-ocular motions intact bilaterally V,VII: smile symmetric, facial light touch sensation normal bilaterally VIII: hearing normal bilaterally IX,X: gag reflex present XI: bilateral shoulder shrug XII: midline tongue extension Motor: Right :  Upper extremity   5/5                                      Left:     Upper extremity   5/5             Lower extremity   Limited strength 2/2 pain               Lower extremity   5/5 Tone and bulk:normal tone throughout; no atrophy noted Sensory: Pinprick and light touch intact throughout, bilaterally  Lab Results: Basic Metabolic Panel: Recent Labs  Lab 12/17/17 2347 12/18/17 0642 12/19/17 0320  NA 135  --  137  K 4.1  --  3.7  CL 98*  --  102  CO2 29  --  27  GLUCOSE 404*  --  226*  BUN 31*  --  28*  CREATININE 1.60* 1.57* 1.46*  CALCIUM 9.2  --   8.7*    Liver Function Tests: Recent Labs  Lab 12/17/17 2347  AST 20  ALT 13*  ALKPHOS 75  BILITOT 0.6  PROT 7.4  ALBUMIN 3.4*   No results for input(s): LIPASE, AMYLASE in the last 168 hours. No results for input(s): AMMONIA in the last 168 hours.  CBC: Recent Labs  Lab 12/17/17 2347 12/18/17 0642  WBC 6.6 9.4  NEUTROABS 4.8  --   HGB 13.3 12.5  HCT 37.9 36.0  MCV 89.1 89.5  PLT 170 173    Cardiac Enzymes: Recent Labs  Lab 12/17/17 2347  TROPONINI <0.03    Lipid Panel: Recent Labs  Lab 12/18/17 0642  CHOL 231*  TRIG 213*  HDL 43  CHOLHDL 5.4  VLDL 43*  LDLCALC 145*    CBG: Recent Labs  Lab 12/18/17 0740 12/18/17 1155 12/18/17 1656 12/18/17 2109 12/19/17 0741  GLUCAP 387* 369* 194* 232* 138*    Microbiology: Results for orders placed or performed in visit on 07/24/16  Urine culture     Status: None   Collection Time: 07/24/16  1:11 PM  Result Value Ref Range Status   Culture  KLEBSIELLA PNEUMONIAE  Final   Colony Count 50,000-100,000 CFU/mL  Final   Organism ID, Bacteria KLEBSIELLA PNEUMONIAE  Final      Susceptibility   Klebsiella pneumoniae -  (no method available)    AMPICILLIN >=32 Resistant     AMOX/CLAVULANIC <=2 Sensitive     AMPICILLIN/SULBACTAM 4 Sensitive     PIP/TAZO 8 Sensitive     IMIPENEM <=0.25 Sensitive     CEFAZOLIN <=4 Not Reportable     CEFTRIAXONE <=1 Sensitive     CEFTAZIDIME <=1 Sensitive     CEFEPIME <=1 Sensitive     GENTAMICIN <=1 Sensitive     TOBRAMYCIN <=1 Sensitive     CIPROFLOXACIN <=0.25 Sensitive     LEVOFLOXACIN <=0.12 Sensitive     NITROFURANTOIN 64 Intermediate     TRIMETH/SULFA* <=20 Sensitive      * NR=NOT REPORTABLE,SEE COMMENTORAL therapy:A cefazolin MIC of <32 predicts susceptibility to the oral agents cefaclor,cefdinir,cefpodoxime,cefprozil,cefuroxime,cephalexin,and loracarbef when used for therapy of uncomplicated UTIs due to E.coli,K.pneumomiae,and P.mirabilis. PARENTERAL therapy: A  cefazolinMIC of >8 indicates resistance to parenteralcefazolin. An alternate test method must beperformed to confirm susceptibility to parenteralcefazolin.    Coagulation Studies: Recent Labs    12/17/17 31-Dec-2345  LABPROT 12.9  INR 0.98    Imaging: Ct Angio Head W Or Wo Contrast  Result Date: 12/18/2017 CLINICAL DATA:  Initial evaluation for acute expressive aphasia, vertical nystagmus. EXAM: CT ANGIOGRAPHY HEAD AND NECK. TECHNIQUE: Multidetector CT imaging of the head and neck was performed using the standard protocol during bolus administration of intravenous contrast. Multiplanar CT image reconstructions and MIPs were obtained to evaluate the vascular anatomy. Carotid stenosis measurements (when applicable) are obtained utilizing NASCET criteria, using the distal internal carotid diameter as the denominator. CONTRAST:  67mL ISOVUE-370 IOPAMIDOL (ISOVUE-370) INJECTION 76% COMPARISON:  Prior CT from 10/10/2016. FINDINGS: CT HEAD FINDINGS Brain: Cerebral atrophy with chronic small vessel ischemic disease, progressed relative to 12-31-2016. Small remote subcortical right frontal infarct noted. No acute intracranial hemorrhage. No acute large vessel territory infarct. No mass lesion, midline shift or mass effect. No hydrocephalus. 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 No extra-axial fluid collection. Vascular: No hyperdense vessel. Calcified atherosclerosis present at the skull base. Skull: Scalp soft tissues and calvarium within normal limits. Sinuses: Visualized paranasal sinuses are clear. Small left mastoid effusion noted. Orbits: Globes orbital soft tissues within normal limits. Review of the MIP images confirms the above findings CTA NECK FINDINGS Aortic arch: Visualized aortic arch of normal caliber with normal 3 vessel morphology. No flow-limiting stenosis about the origin of the great vessels. Visualized subclavian arteries widely patent. Right carotid system: Right common carotid artery tortuous  proximally but widely patent to the bifurcation without stenosis. Right carotid artery system medialized into the retropharyngeal space. Mixed plaque about the proximal right ICA with associated stenosis of up to 45% by NASCET criteria. Right ICA widely patent distally to the skull base without stenosis, dissection, or occlusion. Left carotid system: Left common carotid artery patent from its origin to the bifurcation without stenosis. Left carotid artery system medialized into the retropharyngeal space. Calcified plaque at the left bifurcation with associated stenosis of approximately 60% by NASCET criteria. Distally, left ICA widely patent to the skull base without stenosis, dissection, or occlusion. Vertebral arteries: Both of the vertebral arteries arise from the subclavian arteries. Focal plaque at the origin of the right vertebral artery without significant stenosis. Vertebral arteries patent within the neck without stenosis, dissection, or occlusion. Skeleton: No acute osseous abnormality  identified. Advanced degenerative spondylolysis present at C4-5 through C6-7. Advanced facet arthrosis present throughout the cervical spine, worse on the right. Other neck: No acute soft tissue abnormality within the neck. Salivary glands normal. Subcentimeter hypodense right thyroid nodule noted, of doubtful significance. Thyroid otherwise unremarkable. No adenopathy. Upper chest: Visualized upper chest demonstrates no acute abnormality. Visualized lungs are clear. Review of the MIP images confirms the above findings CTA HEAD FINDINGS Anterior circulation: Petrous segments patent bilaterally. Scattered atheromatous plaque within the cavernous/supraclinoid ICAs with resultant mild to moderate multifocal narrowing, worse on the right. No high-grade stenosis. ICA termini widely patent. A1 segments patent bilaterally. Normal anterior communicating artery. Anterior cerebral arteries patent to their distal aspects without  stenosis. M1 segments patent without stenosis. No proximal M2 occlusion. Prominent atheromatous irregularity throughout the MCA branches bilaterally with several prominent severe distal M3 stenoses, seen on the left on series 10, image 88), seen on the right on series 10, image 80). Posterior circulation: Atheromatous plaque within the bilateral V4 segments with resultant mild to moderate stenoses, left slightly worse than right. Left vertebral artery slightly dominant. Left PICA patent. Right PICA not well visualized. Basilar artery widely patent to its distal aspect without stenosis. Superior cerebral arteries patent bilaterally. Both of the posterior cerebral arteries primarily supplied via the basilar. Moderate to severe distal left P2 stenosis noted (series 10, image 98). There are severe tandem right P2 stenoses. Distal small vessel atheromatous irregularity. Venous sinuses: Patent. Anatomic variants: None significant.  No intracranial aneurysm. Delayed phase: Not performed. Review of the MIP images confirms the above findings IMPRESSION: 1. Negative CTA for emergent large vessel occlusion. No acute intracranial abnormality identified. 2. 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 3. Moderate to advanced intracranial atherosclerotic changes above with multiple prominent severe bilateral distal MCA branch stenoses, with severe bilateral P2 stenoses. No proximal high-grade or correctable stenosis identified. Results were called by telephone at the time of interpretation on 12/18/2017 at 12:41 am to Dr. Marjean Donna , who verbally acknowledged these results. Electronically Signed   By: Jeannine Boga M.D.   On: 12/18/2017 01:01   Ct Angio Neck W And/or Wo Contrast  Result Date: 12/18/2017 CLINICAL DATA:  Initial evaluation for acute expressive aphasia, vertical nystagmus. EXAM: CT ANGIOGRAPHY HEAD AND NECK.  TECHNIQUE: Multidetector CT imaging of the head and neck was performed using the standard protocol during bolus administration of intravenous contrast. Multiplanar CT image reconstructions and MIPs were obtained to evaluate the vascular anatomy. Carotid stenosis measurements (when applicable) are obtained utilizing NASCET criteria, using the distal internal carotid diameter as the denominator. CONTRAST:  28mL ISOVUE-370 IOPAMIDOL (ISOVUE-370) INJECTION 76% COMPARISON:  Prior CT from 10/10/2016. FINDINGS: CT HEAD FINDINGS Brain: Cerebral atrophy with chronic small vessel ischemic disease, progressed relative to 2018. Small remote subcortical right frontal infarct noted. No acute intracranial hemorrhage. No acute large vessel territory infarct. No mass lesion, midline shift or mass effect. No hydrocephalus. 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 No extra-axial fluid collection. Vascular: No hyperdense vessel. Calcified atherosclerosis present at the skull base. Skull: Scalp soft tissues and calvarium within normal limits. Sinuses: Visualized paranasal sinuses are clear. Small left mastoid effusion noted. Orbits: Globes orbital soft tissues within normal limits. Review of the MIP images confirms the above findings CTA NECK FINDINGS Aortic arch: Visualized aortic arch of normal caliber with normal 3 vessel morphology. No flow-limiting stenosis about the origin of the  great vessels. Visualized subclavian arteries widely patent. Right carotid system: Right common carotid artery tortuous proximally but widely patent to the bifurcation without stenosis. Right carotid artery system medialized into the retropharyngeal space. Mixed plaque about the proximal right ICA with associated stenosis of up to 45% by NASCET criteria. Right ICA widely patent distally to the skull base without stenosis, dissection, or occlusion. Left carotid system: Left common carotid artery patent from its origin to the bifurcation without  stenosis. Left carotid artery system medialized into the retropharyngeal space. Calcified plaque at the left bifurcation with associated stenosis of approximately 60% by NASCET criteria. Distally, left ICA widely patent to the skull base without stenosis, dissection, or occlusion. Vertebral arteries: Both of the vertebral arteries arise from the subclavian arteries. Focal plaque at the origin of the right vertebral artery without significant stenosis. Vertebral arteries patent within the neck without stenosis, dissection, or occlusion. Skeleton: No acute osseous abnormality identified. Advanced degenerative spondylolysis present at C4-5 through C6-7. Advanced facet arthrosis present throughout the cervical spine, worse on the right. Other neck: No acute soft tissue abnormality within the neck. Salivary glands normal. Subcentimeter hypodense right thyroid nodule noted, of doubtful significance. Thyroid otherwise unremarkable. No adenopathy. Upper chest: Visualized upper chest demonstrates no acute abnormality. Visualized lungs are clear. Review of the MIP images confirms the above findings CTA HEAD FINDINGS Anterior circulation: Petrous segments patent bilaterally. Scattered atheromatous plaque within the cavernous/supraclinoid ICAs with resultant mild to moderate multifocal narrowing, worse on the right. No high-grade stenosis. ICA termini widely patent. A1 segments patent bilaterally. Normal anterior communicating artery. Anterior cerebral arteries patent to their distal aspects without stenosis. M1 segments patent without stenosis. No proximal M2 occlusion. Prominent atheromatous irregularity throughout the MCA branches bilaterally with several prominent severe distal M3 stenoses, seen on the left on series 10, image 88), seen on the right on series 10, image 80). Posterior circulation: Atheromatous plaque within the bilateral V4 segments with resultant mild to moderate stenoses, left slightly worse than right.  Left vertebral artery slightly dominant. Left PICA patent. Right PICA not well visualized. Basilar artery widely patent to its distal aspect without stenosis. Superior cerebral arteries patent bilaterally. Both of the posterior cerebral arteries primarily supplied via the basilar. Moderate to severe distal left P2 stenosis noted (series 10, image 98). There are severe tandem right P2 stenoses. Distal small vessel atheromatous irregularity. Venous sinuses: Patent. Anatomic variants: None significant.  No intracranial aneurysm. Delayed phase: Not performed. Review of the MIP images confirms the above findings IMPRESSION: 1. Negative CTA for emergent large vessel occlusion. No acute intracranial abnormality identified. 2. 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 3. Moderate to advanced intracranial atherosclerotic changes above with multiple prominent severe bilateral distal MCA branch stenoses, with severe bilateral P2 stenoses. No proximal high-grade or correctable stenosis identified. Results were called by telephone at the time of interpretation on 12/18/2017 at 12:41 am to Dr. Marjean Donna , who verbally acknowledged these results. Electronically Signed   By: Jeannine Boga M.D.   On: 12/18/2017 01:01   Mr Brain Wo Contrast  Result Date: 12/18/2017 CLINICAL DATA:  Dizziness and expressive aphasia EXAM: MRI HEAD WITHOUT CONTRAST MRA HEAD WITHOUT CONTRAST TECHNIQUE: Multiplanar, multiecho pulse sequences of the brain and surrounding structures were obtained without intravenous contrast. Angiographic images of the head were obtained using MRA technique without contrast. COMPARISON:  CTA head neck 12/17/2017 FINDINGS: MRI HEAD FINDINGS Brain:  Incidentally noted is a large development or positive cyst. There is multifocal diffusion restriction involving multiple vascular territories. Largest areas of diffusion  abnormality are in the right frontal lobe and left parietal lobe. Smaller areas include the right parietal lobe, right hippocampus and the periphery of the left postcentral gyrus. Diffuse confluent white matter hyperintensity consistent with chronic microvascular ischemia. No mass lesion. Focus of chronic microhemorrhage in the left parietal lobe. No hydrocephalus, age advanced atrophy or lobar predominant volume loss. No dural abnormality or extra-axial collection. Skull and upper cervical spine: The visualized skull base, calvarium, upper cervical spine and extracranial soft tissues are normal. Sinuses/Orbits: No fluid levels or advanced mucosal thickening. No mastoid effusion. Normal orbits. MRA HEAD FINDINGS Intracranial internal carotid arteries: Mild narrowing of the right cavernous segment. Anterior cerebral arteries: Normal. Normal variant hypoplastic right A1 segment. Patent anterior communicating artery. Middle cerebral arteries: Multifocal mild-to-moderate narrowing of both middle cerebral arteries, which is exaggerated by patient motion. Posterior communicating arteries: Absent bilaterally. Posterior cerebral arteries: Multifocal moderate stenosis of the right P2 segment. Moderate-to-severe left P2 segment stenosis. Basilar artery: Indication Vertebral arteries: Left dominant. Normal. Superior cerebellar arteries: Normal. Anterior inferior cerebellar arteries: Normal. Posterior inferior cerebellar arteries: Below the field of view. IMPRESSION: 1. Multifocal acute to early subacute ischemia within multiple vascular territories, the largest areas of which are in the right frontal and left parietal lobes. Additional of smaller foci of ischemia within the right hippocampus, right parietal lobe and within the left peripheral postcentral gyrus. 2. No hemorrhage or mass effect. 3. Severe chronic microvascular ischemia. 4. Moderate-to-severe stenosis of both posterior cerebral artery P2 segments. 5. Multifocal  narrowing of the middle cerebral arteries is exaggerated by patient motion. No proximal occlusion or proximal high-grade stenosis. Electronically Signed   By: Ulyses Jarred M.D.   On: 12/18/2017 16:09   Mr Jodene Nam Head/brain TM Cm  Result Date: 12/18/2017 CLINICAL DATA:  Dizziness and expressive aphasia EXAM: MRI HEAD WITHOUT CONTRAST MRA HEAD WITHOUT CONTRAST TECHNIQUE: Multiplanar, multiecho pulse sequences of the brain and surrounding structures were obtained without intravenous contrast. Angiographic images of the head were obtained using MRA technique without contrast. COMPARISON:  CTA head neck 12/17/2017 FINDINGS: MRI HEAD FINDINGS Brain: Incidentally noted is a large development or positive cyst. There is multifocal diffusion restriction involving multiple vascular territories. Largest areas of diffusion abnormality are in the right frontal lobe and left parietal lobe. Smaller areas include the right parietal lobe, right hippocampus and the periphery of the left postcentral gyrus. Diffuse confluent white matter hyperintensity consistent with chronic microvascular ischemia. No mass lesion. Focus of chronic microhemorrhage in the left parietal lobe. No hydrocephalus, age advanced atrophy or lobar predominant volume loss. No dural abnormality or extra-axial collection. Skull and upper cervical spine: The visualized skull base, calvarium, upper cervical spine and extracranial soft tissues are normal. Sinuses/Orbits: No fluid levels or advanced mucosal thickening. No mastoid effusion. Normal orbits. MRA HEAD FINDINGS Intracranial internal carotid arteries: Mild narrowing of the right cavernous segment. Anterior cerebral arteries: Normal. Normal variant hypoplastic right A1 segment. Patent anterior communicating artery. Middle cerebral arteries: Multifocal mild-to-moderate narrowing of both middle cerebral arteries, which is exaggerated by patient motion. Posterior communicating arteries: Absent bilaterally.  Posterior cerebral arteries: Multifocal moderate stenosis of the right P2 segment. Moderate-to-severe left P2 segment stenosis. Basilar artery: Indication Vertebral arteries: Left dominant. Normal. Superior cerebellar arteries: Normal. Anterior inferior cerebellar arteries: Normal. Posterior inferior cerebellar arteries: Below the field of view. IMPRESSION: 1. Multifocal acute to early  subacute ischemia within multiple vascular territories, the largest areas of which are in the right frontal and left parietal lobes. Additional of smaller foci of ischemia within the right hippocampus, right parietal lobe and within the left peripheral postcentral gyrus. 2. No hemorrhage or mass effect. 3. Severe chronic microvascular ischemia. 4. Moderate-to-severe stenosis of both posterior cerebral artery P2 segments. 5. Multifocal narrowing of the middle cerebral arteries is exaggerated by patient motion. No proximal occlusion or proximal high-grade stenosis. Electronically Signed   By: Ulyses Jarred M.D.   On: 12/18/2017 16:09    Medications:  I have reviewed the patient's current medications. Scheduled: . aspirin EC  325 mg Oral Daily  . atorvastatin  40 mg Oral Daily  . buPROPion  150 mg Oral BID  . FLUoxetine  40 mg Oral Daily  . glimepiride  4 mg Oral Q breakfast  . heparin  5,000 Units Subcutaneous Q8H  . insulin aspart  0-5 Units Subcutaneous QHS  . insulin aspart  0-9 Units Subcutaneous TID WC  . insulin glargine  20 Units Subcutaneous Daily  . oxybutynin  5 mg Oral TID  . pantoprazole  40 mg Oral Daily    Assessment/Plan: No new complaints.  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  Patient on ASA.  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    Recommendations: 1.  TEE 2.  Continue ASA 3.  If TEE shows no embolic source patient to have prolonged  cardiac monitoring on an outpatient basis.     LOS: 1 day   Alexis Goodell, MD Neurology [phone] 12/19/2017  11:10 AM

## 2017-12-19 NOTE — Evaluation (Addendum)
Speech Language Pathology Evaluation Patient Details Name: Megan Obrien MRN: [id number] DOB: 1946/07/16 Today's Date: 12/19/2017 Time: 1100-1200 SLP Time Calculation (min) (ACUTE ONLY): 60 min  Problem List:  Patient Active Problem List   Diagnosis Date Noted  . Expressive aphasia 12/18/2017  . Stroke ([place]) 12/18/2017  . Mitral valve disease   . Exertional dyspnea 04/17/2017  . Fatigue 02/20/2017  . 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 Medicare annual wellness visit, subsequent 07/24/2016  . Hyperlipidemia 02/06/2016  . Essential hypertension 02/06/2016  . Type 2 diabetes mellitus with complication, without long-term current use of insulin ([place]) 02/06/2016  . Depression 02/06/2016  . GERD (gastroesophageal reflux disease) 02/06/2016  . Urge incontinence of urine 02/06/2016   Past Medical History:  Past Medical History:  Diagnosis Date  . (HFpEF) heart failure with preserved ejection fraction ([place])    a. 03/2017 Echo: EF 55-60%, no rwma, Gr1 DD, mild to mod MS, mod to sev dil LA.  [name] Arthritis   . CKD (chronic kidney disease), stage III ([place])   . Depression   . GERD (gastroesophageal reflux disease)   . Hyperlipidemia   . Hypertension   . Mitral stenosis    a. Mild to moderate by echo 03/2017; b. 03/2017 R/LHC: PCWP 76mmHg, LVEDP 95mmHg. Mean grad of 80mmHg. Valve area of 2.02cm^2.  . Morbid obesity ([place])   . Murmur   . Non-obstructive CAD (coronary artery disease)    a. 03/2017 Cath: mild, non-obstructive CAD.  [name] OSA (obstructive sleep apnea)    a. Previously wore CPAP for several years but stopped on her own and now just uses O2 via [place] @ night.  [name] PAH (pulmonary artery hypertension) ([place])    a. 03/2017 RHC: PA 53mmHg.  . Seasonal allergies   . Type 2 diabetes mellitus ([place])   . Urinary incontinence    Past Surgical History:  Past Surgical History:  Procedure Laterality Date  . BACK SURGERY    . CARDIAC CATHETERIZATION    . KNEE SURGERY Left   . RIGHT/LEFT HEART CATH AND CORONARY  ANGIOGRAPHY Bilateral 04/25/2017   Procedure: Right/Left Heart Cath and Coronary Angiography;  Surgeon: Wellington Hampshire, MD;  Location: [facility];  Service: Cardiovascular;  Laterality: Bilateral;  . TONSILLECTOMY AND ADENOIDECTOMY  1951   HPI:  Pt is an 72 y.o. female with multiple medical problems including DM2, heart failure with preserved ejection fraction, OSA, HTN, CKD, Mitral stenosis, GERD, Obesity who reports that yesterday while talking to daughter had the acute onset of difficulty with speech and dizziness.  Patient felt that her symptoms would improve but when they did not presented for evaluation later that evening.  Reports that her speech is still slightly impaired and at times has problems "getting my words out".  Patient reports that she has had multiple episodes of difficulty with speech in the past but they have all been related to her falls and that she has had "many falls".  She also endorsed forgetfulness with medications or not taking them all the time, stating "I hate taking them all the time.", per PT note. 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 Patient on ASA. 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 Pt lives Alone.    Assessment / Plan / Recommendation Clinical Impression  Pt was seen today for informal Cognitive-linguistic screening using the MOCA-B(Montreal Cognitive Assessment). A  score of 23/30 was obtained. Unsure of pt's baseline Cognitive status as pt has had Multiple acute to subacute small infarcts bilaterally in multiple vascular territories. Pt does endorse changes in her speech which appeared to somewhat motor speech/planning in nature. There are inconsistent, motor speech errors in articulation while attempting to produce a word she is trying to use to answer a question(s). This  is more apparent when pt is confronted w/ new information/questions from new people. She appears to more easily converse w/ Son via phone and a friend w/ less errors/difficulty noted. Discussed this difference for pt describing the ease w/ talking about known topics. Instructed pt to slow down, pause, and begin again if hesitant and unable to as she said "get my words out". During the Cognitive screen, pt demonstrated deficits in areas of Delayed Recall of information/words; Abstract Comparison; general Calculations. Pt was easily distracted and gave extra verbal information during tasks intermittently. With a verbal cue, she could be easily redirected to tasks. Of note, a general verbal cue increased accuracy of tasks to ~100%(especially during delayed recall and calculation tasks). Pt could benefit from f/u post discharge for a more formal assessment to address Cognitive-linguistic concerns pt may still have once post illness, when at home in her own environment. Recommend a more formal assessment in the home to determine any Safety concerns as pt does live Alone per report; Supervision may need to be considered in areas of Medication Management and Finances.     SLP Assessment  SLP Recommendation/Assessment: All further Speech Lanaguage Pathology  needs can be addressed in the next venue of care SLP Visit Diagnosis: Cognitive communication deficit (R41.841);Attention and concentration deficit;Frontal lobe and executive function deficit Attention and concentration deficit following: Cerebral infarction    Follow Up Recommendations  (TBD) post d/c   Frequency and Duration (TBD) post d/c (TBD)      SLP Evaluation Cognition  Overall Cognitive Status: No family/caregiver present to determine baseline cognitive functioning Arousal/Alertness: Awake/alert Orientation Level: Oriented X4 Attention: Focused;Sustained Focused Attention: Impaired Focused Attention Impairment: Verbal basic;Functional  basic Sustained Attention: Impaired Sustained Attention Impairment: Verbal basic;Functional basic Memory: Impaired Memory Impairment: Decreased recall of new information Awareness: Appears intact Problem Solving: Impaired Problem Solving Impairment: Verbal basic;Functional basic Executive Function: Writer: Impaired Organizing Impairment: Verbal basic;Functional basic Behaviors: (distracted easily) Safety/Judgment: Appears intact Comments: however, she has made the comment to PT that she doesn't take her medications at times       Comprehension  Auditory Comprehension Overall Auditory Comprehension: Appears within functional limits for tasks assessed Yes/No Questions: Within Functional Limits Commands: Within Functional Limits Conversation: Simple Other Conversation Comments: distracted at times Interfering Components: Attention EffectiveTechniques: Repetition(verbal cue) Visual Recognition/Discrimination Discrimination: Not tested Reading Comprehension Reading Status: Within funtional limits    Expression Expression Primary Mode of Expression: Verbal Verbal Expression Overall Verbal Expression: Impaired Initiation: Impaired(may be more of a motor speech) Automatic Speech: (no deficits) Repetition: No impairment Naming: No impairment Pragmatics: No impairment Non-Verbal Means of Communication: Not applicable Written Expression Dominant Hand: Right Written Expression: Not tested   Oral / Motor  Oral Motor/Sensory Function Overall Oral Motor/Sensory Function: Within functional limits Motor Speech Overall Motor Speech: Impaired Respiration: Within functional limits Phonation: Normal Resonance: Within functional limits Articulation: Within functional limitis Intelligibility: Intelligible Motor Planning: Impaired(min) Level of Impairment: Word Motor Speech Errors: Groping for words;Inconsistent Interfering Components: (none) Effective Techniques: Slow  rate;Increased vocal intensity;Pause   GO  Orinda Kenner, MS, CCC-SLP Watson,Katherine 12/19/2017, 1:01 PM

## 2017-12-19 NOTE — Consult Note (Signed)
    [facility] has been requested to perform a transesophageal echocardiogram on Megan Obrien for evaluation of stroke.  After careful review of history and examination, the risks and benefits of transesophageal echocardiogram have been explained including risks of esophageal damage, perforation (1:10,000 risk), bleeding, pharyngeal hematoma as well as other potential complications associated with conscious sedation including aspiration, arrhythmia, respiratory failure and death. Alternatives to treatment were discussed, questions were answered. No prior radiation to the chest. No prior surgeries to the esophagus. No dysphagia. Patient is willing to proceed.   [name], PA-C 12/19/2017 12:36 PM

## 2017-12-19 NOTE — Progress Notes (Signed)
[facility] NAME: Megan Obrien    MR#:  [id number]  DATE OF BIRTH:  May 27, 1946  SUBJECTIVE:  CHIEF COMPLAINT:   Chief Complaint  Patient presents with  . Dizziness   Came with speech difficulty, still have the symptoms. Denies headache or any other symptoms.   REVIEW OF SYSTEMS:  CONSTITUTIONAL: No fever, fatigue or weakness.  EYES: No blurred or double vision.  EARS, NOSE, AND THROAT: No tinnitus or ear pain.  RESPIRATORY: No cough, shortness of breath, wheezing or hemoptysis.  CARDIOVASCULAR: No chest pain, orthopnea, edema.  GASTROINTESTINAL: No nausea, vomiting, diarrhea or abdominal pain.  GENITOURINARY: No dysuria, hematuria.  ENDOCRINE: No polyuria, nocturia,  HEMATOLOGY: No anemia, easy bruising or bleeding SKIN: No rash or lesion. MUSCULOSKELETAL: No joint pain or arthritis.   NEUROLOGIC: No tingling, numbness, weakness.  PSYCHIATRY: No anxiety or depression.   ROS  DRUG ALLERGIES:  No Known Allergies  VITALS:  Blood pressure (!) 160/115, pulse 71, temperature 98.7 F (37.1 C), resp. rate 20, height 5\' 6"  (1.676 m), weight 123.3 kg (271 lb 12.8 oz), SpO2 100 %.  PHYSICAL EXAMINATION:  GENERAL:  72 y.o.-year-old patient lying in the bed with no acute distress.  EYES: Pupils equal, round, reactive to light and accommodation. No scleral icterus. Extraocular muscles intact.  HEENT: Head atraumatic, normocephalic. Oropharynx and nasopharynx clear.  NECK:  Supple, no jugular venous distention. No thyroid enlargement, no tenderness.  LUNGS: Normal breath sounds bilaterally, no wheezing, rales,rhonchi or crepitation. No use of accessory muscles of respiration.  CARDIOVASCULAR: S1, S2 normal. No murmurs, rubs, or gallops.  ABDOMEN: Soft, nontender, nondistended. Bowel sounds present. No organomegaly or mass.  EXTREMITIES: No pedal edema, cyanosis, or clubbing.  NEUROLOGIC: Cranial nerves II through XII are intact.  Muscle strength 4/5 in all extremities. Sensation intact. Gait not checked.  PSYCHIATRIC: The patient is alert and oriented x 3.  SKIN: No obvious rash, lesion, or ulcer.   Physical Exam LABORATORY PANEL:   CBC Recent Labs  Lab 12/18/17 0642  WBC 9.4  HGB 12.5  HCT 36.0  PLT 173   ------------------------------------------------------------------------------------------------------------------  Chemistries  Recent Labs  Lab 12/17/17 2347  12/19/17 0320  NA 135  --  137  K 4.1  --  3.7  CL 98*  --  102  CO2 29  --  27  GLUCOSE 404*  --  226*  BUN 31*  --  28*  CREATININE 1.60*   < > 1.46*  CALCIUM 9.2  --  8.7*  AST 20  --   --   ALT 13*  --   --   ALKPHOS 75  --   --   BILITOT 0.6  --   --    < > = values in this interval not displayed.   ------------------------------------------------------------------------------------------------------------------  Cardiac Enzymes Recent Labs  Lab 12/17/17 2347  TROPONINI <0.03   ------------------------------------------------------------------------------------------------------------------  RADIOLOGY:  Ct Angio Head W Or Wo Contrast  Result Date: 12/18/2017 CLINICAL DATA:  Initial evaluation for acute expressive aphasia, vertical nystagmus. EXAM: CT ANGIOGRAPHY HEAD AND NECK. TECHNIQUE: Multidetector CT imaging of the head and neck was performed using the standard protocol during bolus administration of intravenous contrast. Multiplanar CT image reconstructions and MIPs were obtained to evaluate the vascular anatomy. Carotid stenosis measurements (when applicable) are obtained utilizing NASCET criteria, using the distal internal carotid diameter as the denominator. CONTRAST:  2mL ISOVUE-370 IOPAMIDOL (ISOVUE-370) INJECTION 76% COMPARISON:  Prior CT from  10/10/2016. FINDINGS: CT HEAD FINDINGS Brain: Cerebral atrophy with chronic small vessel ischemic disease, progressed relative to 2018. Small remote subcortical right frontal  infarct noted. No acute intracranial hemorrhage. No acute large vessel territory infarct. No mass lesion, midline shift or mass effect. No hydrocephalus. 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 No extra-axial fluid collection. Vascular: No hyperdense vessel. Calcified atherosclerosis present at the skull base. Skull: Scalp soft tissues and calvarium within normal limits. Sinuses: Visualized paranasal sinuses are clear. Small left mastoid effusion noted. Orbits: Globes orbital soft tissues within normal limits. Review of the MIP images confirms the above findings CTA NECK FINDINGS Aortic arch: Visualized aortic arch of normal caliber with normal 3 vessel morphology. No flow-limiting stenosis about the origin of the great vessels. Visualized subclavian arteries widely patent. Right carotid system: Right common carotid artery tortuous proximally but widely patent to the bifurcation without stenosis. Right carotid artery system medialized into the retropharyngeal space. Mixed plaque about the proximal right ICA with associated stenosis of up to 45% by NASCET criteria. Right ICA widely patent distally to the skull base without stenosis, dissection, or occlusion. Left carotid system: Left common carotid artery patent from its origin to the bifurcation without stenosis. Left carotid artery system medialized into the retropharyngeal space. Calcified plaque at the left bifurcation with associated stenosis of approximately 60% by NASCET criteria. Distally, left ICA widely patent to the skull base without stenosis, dissection, or occlusion. Vertebral arteries: Both of the vertebral arteries arise from the subclavian arteries. Focal plaque at the origin of the right vertebral artery without significant stenosis. Vertebral arteries patent within the neck without stenosis, dissection, or occlusion. Skeleton: No acute osseous abnormality identified. Advanced degenerative spondylolysis present at C4-5 through C6-7. Advanced  facet arthrosis present throughout the cervical spine, worse on the right. Other neck: No acute soft tissue abnormality within the neck. Salivary glands normal. Subcentimeter hypodense right thyroid nodule noted, of doubtful significance. Thyroid otherwise unremarkable. No adenopathy. Upper chest: Visualized upper chest demonstrates no acute abnormality. Visualized lungs are clear. Review of the MIP images confirms the above findings CTA HEAD FINDINGS Anterior circulation: Petrous segments patent bilaterally. Scattered atheromatous plaque within the cavernous/supraclinoid ICAs with resultant mild to moderate multifocal narrowing, worse on the right. No high-grade stenosis. ICA termini widely patent. A1 segments patent bilaterally. Normal anterior communicating artery. Anterior cerebral arteries patent to their distal aspects without stenosis. M1 segments patent without stenosis. No proximal M2 occlusion. Prominent atheromatous irregularity throughout the MCA branches bilaterally with several prominent severe distal M3 stenoses, seen on the left on series 10, image 88), seen on the right on series 10, image 80). Posterior circulation: Atheromatous plaque within the bilateral V4 segments with resultant mild to moderate stenoses, left slightly worse than right. Left vertebral artery slightly dominant. Left PICA patent. Right PICA not well visualized. Basilar artery widely patent to its distal aspect without stenosis. Superior cerebral arteries patent bilaterally. Both of the posterior cerebral arteries primarily supplied via the basilar. Moderate to severe distal left P2 stenosis noted (series 10, image 98). There are severe tandem right P2 stenoses. Distal small vessel atheromatous irregularity. Venous sinuses: Patent. Anatomic variants: None significant.  No intracranial aneurysm. Delayed phase: Not performed. Review of the MIP images confirms the above findings IMPRESSION: 1. Negative CTA for emergent large vessel  occlusion. No acute intracranial abnormality identified. 2. 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 3. Moderate to advanced  intracranial atherosclerotic changes above with multiple prominent severe bilateral distal MCA branch stenoses, with severe bilateral P2 stenoses. No proximal high-grade or correctable stenosis identified. Results were called by telephone at the time of interpretation on 12/18/2017 at 12:41 am to Dr. Marjean Donna , who verbally acknowledged these results. Electronically Signed   By: Jeannine Boga M.D.   On: 12/18/2017 01:01   Ct Angio Neck W And/or Wo Contrast  Result Date: 12/18/2017 CLINICAL DATA:  Initial evaluation for acute expressive aphasia, vertical nystagmus. EXAM: CT ANGIOGRAPHY HEAD AND NECK. TECHNIQUE: Multidetector CT imaging of the head and neck was performed using the standard protocol during bolus administration of intravenous contrast. Multiplanar CT image reconstructions and MIPs were obtained to evaluate the vascular anatomy. Carotid stenosis measurements (when applicable) are obtained utilizing NASCET criteria, using the distal internal carotid diameter as the denominator. CONTRAST:  45mL ISOVUE-370 IOPAMIDOL (ISOVUE-370) INJECTION 76% COMPARISON:  Prior CT from 10/10/2016. FINDINGS: CT HEAD FINDINGS Brain: Cerebral atrophy with chronic small vessel ischemic disease, progressed relative to 2018. Small remote subcortical right frontal infarct noted. No acute intracranial hemorrhage. No acute large vessel territory infarct. No mass lesion, midline shift or mass effect. No hydrocephalus. 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 No extra-axial fluid collection. Vascular: No hyperdense vessel. Calcified atherosclerosis present at the skull base. Skull: Scalp soft tissues and calvarium within normal limits. Sinuses: Visualized paranasal sinuses are clear. Small left  mastoid effusion noted. Orbits: Globes orbital soft tissues within normal limits. Review of the MIP images confirms the above findings CTA NECK FINDINGS Aortic arch: Visualized aortic arch of normal caliber with normal 3 vessel morphology. No flow-limiting stenosis about the origin of the great vessels. Visualized subclavian arteries widely patent. Right carotid system: Right common carotid artery tortuous proximally but widely patent to the bifurcation without stenosis. Right carotid artery system medialized into the retropharyngeal space. Mixed plaque about the proximal right ICA with associated stenosis of up to 45% by NASCET criteria. Right ICA widely patent distally to the skull base without stenosis, dissection, or occlusion. Left carotid system: Left common carotid artery patent from its origin to the bifurcation without stenosis. Left carotid artery system medialized into the retropharyngeal space. Calcified plaque at the left bifurcation with associated stenosis of approximately 60% by NASCET criteria. Distally, left ICA widely patent to the skull base without stenosis, dissection, or occlusion. Vertebral arteries: Both of the vertebral arteries arise from the subclavian arteries. Focal plaque at the origin of the right vertebral artery without significant stenosis. Vertebral arteries patent within the neck without stenosis, dissection, or occlusion. Skeleton: No acute osseous abnormality identified. Advanced degenerative spondylolysis present at C4-5 through C6-7. Advanced facet arthrosis present throughout the cervical spine, worse on the right. Other neck: No acute soft tissue abnormality within the neck. Salivary glands normal. Subcentimeter hypodense right thyroid nodule noted, of doubtful significance. Thyroid otherwise unremarkable. No adenopathy. Upper chest: Visualized upper chest demonstrates no acute abnormality. Visualized lungs are clear. Review of the MIP images confirms the above findings CTA  HEAD FINDINGS Anterior circulation: Petrous segments patent bilaterally. Scattered atheromatous plaque within the cavernous/supraclinoid ICAs with resultant mild to moderate multifocal narrowing, worse on the right. No high-grade stenosis. ICA termini widely patent. A1 segments patent bilaterally. Normal anterior communicating artery. Anterior cerebral arteries patent to their distal aspects without stenosis. M1 segments patent without stenosis. No proximal M2 occlusion. Prominent atheromatous irregularity throughout the MCA branches bilaterally with several prominent severe distal M3 stenoses, seen on the left on  series 10, image 88), seen on the right on series 10, image 80). Posterior circulation: Atheromatous plaque within the bilateral V4 segments with resultant mild to moderate stenoses, left slightly worse than right. Left vertebral artery slightly dominant. Left PICA patent. Right PICA not well visualized. Basilar artery widely patent to its distal aspect without stenosis. Superior cerebral arteries patent bilaterally. Both of the posterior cerebral arteries primarily supplied via the basilar. Moderate to severe distal left P2 stenosis noted (series 10, image 98). There are severe tandem right P2 stenoses. Distal small vessel atheromatous irregularity. Venous sinuses: Patent. Anatomic variants: None significant.  No intracranial aneurysm. Delayed phase: Not performed. Review of the MIP images confirms the above findings IMPRESSION: 1. Negative CTA for emergent large vessel occlusion. No acute intracranial abnormality identified. 2. 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 3. Moderate to advanced intracranial atherosclerotic changes above with multiple prominent severe bilateral distal MCA branch stenoses, with severe bilateral P2 stenoses. No proximal high-grade or correctable stenosis identified. Results were  called by telephone at the time of interpretation on 12/18/2017 at 12:41 am to Dr. Marjean Donna , who verbally acknowledged these results. Electronically Signed   By: Jeannine Boga M.D.   On: 12/18/2017 01:01   Mr Brain Wo Contrast  Result Date: 12/18/2017 CLINICAL DATA:  Dizziness and expressive aphasia EXAM: MRI HEAD WITHOUT CONTRAST MRA HEAD WITHOUT CONTRAST TECHNIQUE: Multiplanar, multiecho pulse sequences of the brain and surrounding structures were obtained without intravenous contrast. Angiographic images of the head were obtained using MRA technique without contrast. COMPARISON:  CTA head neck 12/17/2017 FINDINGS: MRI HEAD FINDINGS Brain: Incidentally noted is a large development or positive cyst. There is multifocal diffusion restriction involving multiple vascular territories. Largest areas of diffusion abnormality are in the right frontal lobe and left parietal lobe. Smaller areas include the right parietal lobe, right hippocampus and the periphery of the left postcentral gyrus. Diffuse confluent white matter hyperintensity consistent with chronic microvascular ischemia. No mass lesion. Focus of chronic microhemorrhage in the left parietal lobe. No hydrocephalus, age advanced atrophy or lobar predominant volume loss. No dural abnormality or extra-axial collection. Skull and upper cervical spine: The visualized skull base, calvarium, upper cervical spine and extracranial soft tissues are normal. Sinuses/Orbits: No fluid levels or advanced mucosal thickening. No mastoid effusion. Normal orbits. MRA HEAD FINDINGS Intracranial internal carotid arteries: Mild narrowing of the right cavernous segment. Anterior cerebral arteries: Normal. Normal variant hypoplastic right A1 segment. Patent anterior communicating artery. Middle cerebral arteries: Multifocal mild-to-moderate narrowing of both middle cerebral arteries, which is exaggerated by patient motion. Posterior communicating arteries: Absent  bilaterally. Posterior cerebral arteries: Multifocal moderate stenosis of the right P2 segment. Moderate-to-severe left P2 segment stenosis. Basilar artery: Indication Vertebral arteries: Left dominant. Normal. Superior cerebellar arteries: Normal. Anterior inferior cerebellar arteries: Normal. Posterior inferior cerebellar arteries: Below the field of view. IMPRESSION: 1. Multifocal acute to early subacute ischemia within multiple vascular territories, the largest areas of which are in the right frontal and left parietal lobes. Additional of smaller foci of ischemia within the right hippocampus, right parietal lobe and within the left peripheral postcentral gyrus. 2. No hemorrhage or mass effect. 3. Severe chronic microvascular ischemia. 4. Moderate-to-severe stenosis of both posterior cerebral artery P2 segments. 5. Multifocal narrowing of the middle cerebral arteries is exaggerated by patient motion. No proximal occlusion or proximal high-grade stenosis. Electronically Signed   By: Cletus Gash.D.  On: 12/18/2017 16:09   Mr Jodene Nam Head/brain IO Cm  Result Date: 12/18/2017 CLINICAL DATA:  Dizziness and expressive aphasia EXAM: MRI HEAD WITHOUT CONTRAST MRA HEAD WITHOUT CONTRAST TECHNIQUE: Multiplanar, multiecho pulse sequences of the brain and surrounding structures were obtained without intravenous contrast. Angiographic images of the head were obtained using MRA technique without contrast. COMPARISON:  CTA head neck 12/17/2017 FINDINGS: MRI HEAD FINDINGS Brain: Incidentally noted is a large development or positive cyst. There is multifocal diffusion restriction involving multiple vascular territories. Largest areas of diffusion abnormality are in the right frontal lobe and left parietal lobe. Smaller areas include the right parietal lobe, right hippocampus and the periphery of the left postcentral gyrus. Diffuse confluent white matter hyperintensity consistent with chronic microvascular ischemia. No mass  lesion. Focus of chronic microhemorrhage in the left parietal lobe. No hydrocephalus, age advanced atrophy or lobar predominant volume loss. No dural abnormality or extra-axial collection. Skull and upper cervical spine: The visualized skull base, calvarium, upper cervical spine and extracranial soft tissues are normal. Sinuses/Orbits: No fluid levels or advanced mucosal thickening. No mastoid effusion. Normal orbits. MRA HEAD FINDINGS Intracranial internal carotid arteries: Mild narrowing of the right cavernous segment. Anterior cerebral arteries: Normal. Normal variant hypoplastic right A1 segment. Patent anterior communicating artery. Middle cerebral arteries: Multifocal mild-to-moderate narrowing of both middle cerebral arteries, which is exaggerated by patient motion. Posterior communicating arteries: Absent bilaterally. Posterior cerebral arteries: Multifocal moderate stenosis of the right P2 segment. Moderate-to-severe left P2 segment stenosis. Basilar artery: Indication Vertebral arteries: Left dominant. Normal. Superior cerebellar arteries: Normal. Anterior inferior cerebellar arteries: Normal. Posterior inferior cerebellar arteries: Below the field of view. IMPRESSION: 1. Multifocal acute to early subacute ischemia within multiple vascular territories, the largest areas of which are in the right frontal and left parietal lobes. Additional of smaller foci of ischemia within the right hippocampus, right parietal lobe and within the left peripheral postcentral gyrus. 2. No hemorrhage or mass effect. 3. Severe chronic microvascular ischemia. 4. Moderate-to-severe stenosis of both posterior cerebral artery P2 segments. 5. Multifocal narrowing of the middle cerebral arteries is exaggerated by patient motion. No proximal occlusion or proximal high-grade stenosis. Electronically Signed   By: Ulyses Jarred M.D.   On: 12/18/2017 16:09    ASSESSMENT AND PLAN:   Principal Problem:   Expressive aphasia Active  Problems:   Hyperlipidemia   Essential hypertension   Type 2 diabetes mellitus with complication, without long-term current use of insulin (HCC)   Depression   GERD (gastroesophageal reflux disease)   Stroke [facility])    * Expressive aphasia -patient is still experiencing this      Acute stroke   reviewed Echo report. Severe mitral stenosis and calcifications, LVH pattern.   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   HBA1c is high- advised and started on lantus   LDL is high- started on statin.   SLP eval and therapy after discharge   Need TEE, called cardio consult.  * DM   Hold oral meds, start on lantus  * Hyperlipidemia   Start atorvastatin.  * hypertension   Have multiple intracranial stenosis   She will benefit from slightly higher blood pressure, wills top BP meds.      All the records are reviewed and case discussed with Care Management/Social Workerr. Management plans discussed with the patient, family and  they are in agreement.  CODE STATUS: Full.  TOTAL TIME TAKING CARE OF THIS PATIENT: 35 minutes.  Spoke to her daughter about the plan.  POSSIBLE D/C IN 1-2 DAYS, DEPENDING ON CLINICAL CONDITION.   Vaughan Basta M.D on 12/19/2017   Between 7am to 6pm - Pager - [phone]  After 6pm go to [email or web address] - password [email or web address] [facility]  Office  [phone]  CC: Primary care physician; Pleas Koch, NP  Note: This dictation was prepared with Dragon dictation along with smaller phrase technology. Any transcriptional errors that result from this process are unintentional.

## 2017-12-19 NOTE — Progress Notes (Signed)
Patient became very weak when transferring to [facility], requiring x2+ assist to get back to bed. May need PT to reeval prior to discharge. Primary RN made aware. Madlyn Frankel, RN

## 2017-12-19 NOTE — Progress Notes (Signed)
Physical Therapy Treatment Patient Details Name: Megan Obrien MRN: [id number] DOB: 15-Jul-1946 Today's Date: 12/19/2017    History of Present Illness 72 y.o. female who presents with dizziness and expressive aphasia. Admitted under observation status for stroke work up.  PMH includes heart failure, CKD, htn, OSA, PAH, DM, back and knee surgery.    PT Comments    Pt able to progress to ambulating 25 feet and then 30 feet with SW CGA (pt steady ambulating to/from bathroom).  Pt does require increased assist standing from bed but able to stand with CGA from [facility] over toilet (using grab bar) and CGA from recliner.  Will continue to focus on strengthening and progressive functional mobility per pt tolerance.    Follow Up Recommendations  Home health PT;Supervision for mobility/OOB     Equipment Recommendations  Standard walker    Recommendations for Other Services       Precautions / Restrictions Precautions Precautions: Fall Restrictions Weight Bearing Restrictions: No    Mobility  Bed Mobility Overal bed mobility: Needs Assistance Bed Mobility: Supine to Sit     Supine to sit: Supervision;HOB elevated     General bed mobility comments: increased time/effort to perform on own; use of bed rail  Transfers Overall transfer level: Needs assistance Equipment used: Standard walker Transfers: Sit to/from Stand Sit to Stand: (mod assist standing from bed (pt pulling up with hand hold assist like she does at home); CGA from [facility] over toilet using grab bar; CGA from recliner using B armrest)            Ambulation/Gait Ambulation/Gait assistance: Min guard Ambulation Distance (Feet): (25 feet; 30 feet) Assistive device: Standard walker Gait Pattern/deviations: Step-to pattern Gait velocity: decreased   General Gait Details: antalgic; decreased stance time R LE (d/t R>L knee pain); limited distance d/t R knee pain   Stairs            Wheelchair Mobility     Modified Rankin (Stroke Patients Only)       Balance Overall balance assessment: Needs assistance;History of Falls Sitting-balance support: No upper extremity supported;Feet supported Sitting balance-Leahy Scale: Good Sitting balance - Comments: steady sitting reaching within BOS   Standing balance support: Bilateral upper extremity supported Standing balance-Leahy Scale: Poor Standing balance comment: requires B UE support for ambulation                            Cognition Arousal/Alertness: Awake/alert Behavior During Therapy: WFL for tasks assessed/performed Overall Cognitive Status: No family/caregiver present to determine baseline cognitive functioning                                        Exercises      General Comments General comments (skin integrity, edema, etc.): Pt resting in bed upon PT entry.  Nursing cleared pt for participation in physical therapy.  Pt agreeable to PT session.      Pertinent Vitals/Pain Pain Assessment: 0-10 Pain Score: 2  Pain Location: R knee Pain Descriptors / Indicators: Aching Pain Intervention(s): Limited activity within patient's tolerance;Monitored during session;Repositioned  HR and O2 on room air WFL during session.  Pt's BP 185/101 after ambulating back from bathroom (nursing notified who reported she planned to re-check if after pt rested).    Home Living     Available Help at Discharge: Family;Friend(s);Available PRN/intermittently Type of  Home: Apartment(2nd floor per PT)              Prior Function            PT Goals (current goals can now be found in the care plan section) Acute Rehab PT Goals Patient Stated Goal: to go home PT Goal Formulation: With patient Time For Goal Achievement: 01/01/18 Potential to Achieve Goals: Good Progress towards PT goals: Progressing toward goals    Frequency    7X/week      PT Plan Current plan remains appropriate    Co-evaluation               AM-PAC PT "6 Clicks" Daily Activity  Outcome Measure  Difficulty turning over in bed (including adjusting bedclothes, sheets and blankets)?: A Little Difficulty moving from lying on back to sitting on the side of the bed? : A Little Difficulty sitting down on and standing up from a chair with arms (e.g., wheelchair, bedside commode, etc,.)?: Unable Help needed moving to and from a bed to chair (including a wheelchair)?: A Lot Help needed walking in hospital room?: A Little Help needed climbing 3-5 steps with a railing? : Total 6 Click Score: 13    End of Session Equipment Utilized During Treatment: Gait belt Activity Tolerance: Patient tolerated treatment well Patient left: in chair;with call bell/phone within reach;with chair alarm set Nurse Communication: Mobility status;Precautions PT Visit Diagnosis: Other abnormalities of gait and mobility (R26.89);Muscle weakness (generalized) (M62.81);History of falling (Z91.81);Repeated falls (R29.6);Difficulty in walking, not elsewhere classified (R26.2)     Time: 7564-3329 PT Time Calculation (min) (ACUTE ONLY): 40 min  Charges:  $Gait Training: 8-22 mins $Therapeutic Activity: 23-37 mins                    G CodesLeitha Bleak, PT 12/19/17, 1:03 PM [phone]

## 2017-12-19 NOTE — H&P (View-Only) (Signed)
[facility] NAME: Megan Obrien    MR#:  [id number]  DATE OF BIRTH:  March 25, 1946  SUBJECTIVE:  CHIEF COMPLAINT:   Chief Complaint  Patient presents with  . Dizziness   Came with speech difficulty, still have the symptoms. Denies headache or any other symptoms.   REVIEW OF SYSTEMS:  CONSTITUTIONAL: No fever, fatigue or weakness.  EYES: No blurred or double vision.  EARS, NOSE, AND THROAT: No tinnitus or ear pain.  RESPIRATORY: No cough, shortness of breath, wheezing or hemoptysis.  CARDIOVASCULAR: No chest pain, orthopnea, edema.  GASTROINTESTINAL: No nausea, vomiting, diarrhea or abdominal pain.  GENITOURINARY: No dysuria, hematuria.  ENDOCRINE: No polyuria, nocturia,  HEMATOLOGY: No anemia, easy bruising or bleeding SKIN: No rash or lesion. MUSCULOSKELETAL: No joint pain or arthritis.   NEUROLOGIC: No tingling, numbness, weakness.  PSYCHIATRY: No anxiety or depression.   ROS  DRUG ALLERGIES:  No Known Allergies  VITALS:  Blood pressure (!) 160/115, pulse 71, temperature 98.7 F (37.1 C), resp. rate 20, height 5\' 6"  (1.676 m), weight 123.3 kg (271 lb 12.8 oz), SpO2 100 %.  PHYSICAL EXAMINATION:  GENERAL:  72 y.o.-year-old patient lying in the bed with no acute distress.  EYES: Pupils equal, round, reactive to light and accommodation. No scleral icterus. Extraocular muscles intact.  HEENT: Head atraumatic, normocephalic. Oropharynx and nasopharynx clear.  NECK:  Supple, no jugular venous distention. No thyroid enlargement, no tenderness.  LUNGS: Normal breath sounds bilaterally, no wheezing, rales,rhonchi or crepitation. No use of accessory muscles of respiration.  CARDIOVASCULAR: S1, S2 normal. No murmurs, rubs, or gallops.  ABDOMEN: Soft, nontender, nondistended. Bowel sounds present. No organomegaly or mass.  EXTREMITIES: No pedal edema, cyanosis, or clubbing.  NEUROLOGIC: Cranial nerves II through XII are intact.  Muscle strength 4/5 in all extremities. Sensation intact. Gait not checked.  PSYCHIATRIC: The patient is alert and oriented x 3.  SKIN: No obvious rash, lesion, or ulcer.   Physical Exam LABORATORY PANEL:   CBC Recent Labs  Lab 12/18/17 0642  WBC 9.4  HGB 12.5  HCT 36.0  PLT 173   ------------------------------------------------------------------------------------------------------------------  Chemistries  Recent Labs  Lab 12/17/17 2347  12/19/17 0320  NA 135  --  137  K 4.1  --  3.7  CL 98*  --  102  CO2 29  --  27  GLUCOSE 404*  --  226*  BUN 31*  --  28*  CREATININE 1.60*   < > 1.46*  CALCIUM 9.2  --  8.7*  AST 20  --   --   ALT 13*  --   --   ALKPHOS 75  --   --   BILITOT 0.6  --   --    < > = values in this interval not displayed.   ------------------------------------------------------------------------------------------------------------------  Cardiac Enzymes Recent Labs  Lab 12/17/17 2347  TROPONINI <0.03   ------------------------------------------------------------------------------------------------------------------  RADIOLOGY:  Ct Angio Head W Or Wo Contrast  Result Date: 12/18/2017 CLINICAL DATA:  Initial evaluation for acute expressive aphasia, vertical nystagmus. EXAM: CT ANGIOGRAPHY HEAD AND NECK. TECHNIQUE: Multidetector CT imaging of the head and neck was performed using the standard protocol during bolus administration of intravenous contrast. Multiplanar CT image reconstructions and MIPs were obtained to evaluate the vascular anatomy. Carotid stenosis measurements (when applicable) are obtained utilizing NASCET criteria, using the distal internal carotid diameter as the denominator. CONTRAST:  71mL ISOVUE-370 IOPAMIDOL (ISOVUE-370) INJECTION 76% COMPARISON:  Prior CT from  10/10/2016. FINDINGS: CT HEAD FINDINGS Brain: Cerebral atrophy with chronic small vessel ischemic disease, progressed relative to 2018. Small remote subcortical right frontal  infarct noted. No acute intracranial hemorrhage. No acute large vessel territory infarct. No mass lesion, midline shift or mass effect. No hydrocephalus. 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 No extra-axial fluid collection. Vascular: No hyperdense vessel. Calcified atherosclerosis present at the skull base. Skull: Scalp soft tissues and calvarium within normal limits. Sinuses: Visualized paranasal sinuses are clear. Small left mastoid effusion noted. Orbits: Globes orbital soft tissues within normal limits. Review of the MIP images confirms the above findings CTA NECK FINDINGS Aortic arch: Visualized aortic arch of normal caliber with normal 3 vessel morphology. No flow-limiting stenosis about the origin of the great vessels. Visualized subclavian arteries widely patent. Right carotid system: Right common carotid artery tortuous proximally but widely patent to the bifurcation without stenosis. Right carotid artery system medialized into the retropharyngeal space. Mixed plaque about the proximal right ICA with associated stenosis of up to 45% by NASCET criteria. Right ICA widely patent distally to the skull base without stenosis, dissection, or occlusion. Left carotid system: Left common carotid artery patent from its origin to the bifurcation without stenosis. Left carotid artery system medialized into the retropharyngeal space. Calcified plaque at the left bifurcation with associated stenosis of approximately 60% by NASCET criteria. Distally, left ICA widely patent to the skull base without stenosis, dissection, or occlusion. Vertebral arteries: Both of the vertebral arteries arise from the subclavian arteries. Focal plaque at the origin of the right vertebral artery without significant stenosis. Vertebral arteries patent within the neck without stenosis, dissection, or occlusion. Skeleton: No acute osseous abnormality identified. Advanced degenerative spondylolysis present at C4-5 through C6-7. Advanced  facet arthrosis present throughout the cervical spine, worse on the right. Other neck: No acute soft tissue abnormality within the neck. Salivary glands normal. Subcentimeter hypodense right thyroid nodule noted, of doubtful significance. Thyroid otherwise unremarkable. No adenopathy. Upper chest: Visualized upper chest demonstrates no acute abnormality. Visualized lungs are clear. Review of the MIP images confirms the above findings CTA HEAD FINDINGS Anterior circulation: Petrous segments patent bilaterally. Scattered atheromatous plaque within the cavernous/supraclinoid ICAs with resultant mild to moderate multifocal narrowing, worse on the right. No high-grade stenosis. ICA termini widely patent. A1 segments patent bilaterally. Normal anterior communicating artery. Anterior cerebral arteries patent to their distal aspects without stenosis. M1 segments patent without stenosis. No proximal M2 occlusion. Prominent atheromatous irregularity throughout the MCA branches bilaterally with several prominent severe distal M3 stenoses, seen on the left on series 10, image 88), seen on the right on series 10, image 80). Posterior circulation: Atheromatous plaque within the bilateral V4 segments with resultant mild to moderate stenoses, left slightly worse than right. Left vertebral artery slightly dominant. Left PICA patent. Right PICA not well visualized. Basilar artery widely patent to its distal aspect without stenosis. Superior cerebral arteries patent bilaterally. Both of the posterior cerebral arteries primarily supplied via the basilar. Moderate to severe distal left P2 stenosis noted (series 10, image 98). There are severe tandem right P2 stenoses. Distal small vessel atheromatous irregularity. Venous sinuses: Patent. Anatomic variants: None significant.  No intracranial aneurysm. Delayed phase: Not performed. Review of the MIP images confirms the above findings IMPRESSION: 1. Negative CTA for emergent large vessel  occlusion. No acute intracranial abnormality identified. 2. 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 3. Moderate to advanced  intracranial atherosclerotic changes above with multiple prominent severe bilateral distal MCA branch stenoses, with severe bilateral P2 stenoses. No proximal high-grade or correctable stenosis identified. Results were called by telephone at the time of interpretation on 12/18/2017 at 12:41 am to Dr. Marjean Donna , who verbally acknowledged these results. Electronically Signed   By: Jeannine Boga M.D.   On: 12/18/2017 01:01   Ct Angio Neck W And/or Wo Contrast  Result Date: 12/18/2017 CLINICAL DATA:  Initial evaluation for acute expressive aphasia, vertical nystagmus. EXAM: CT ANGIOGRAPHY HEAD AND NECK. TECHNIQUE: Multidetector CT imaging of the head and neck was performed using the standard protocol during bolus administration of intravenous contrast. Multiplanar CT image reconstructions and MIPs were obtained to evaluate the vascular anatomy. Carotid stenosis measurements (when applicable) are obtained utilizing NASCET criteria, using the distal internal carotid diameter as the denominator. CONTRAST:  72mL ISOVUE-370 IOPAMIDOL (ISOVUE-370) INJECTION 76% COMPARISON:  Prior CT from 10/10/2016. FINDINGS: CT HEAD FINDINGS Brain: Cerebral atrophy with chronic small vessel ischemic disease, progressed relative to 2018. Small remote subcortical right frontal infarct noted. No acute intracranial hemorrhage. No acute large vessel territory infarct. No mass lesion, midline shift or mass effect. No hydrocephalus. 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 No extra-axial fluid collection. Vascular: No hyperdense vessel. Calcified atherosclerosis present at the skull base. Skull: Scalp soft tissues and calvarium within normal limits. Sinuses: Visualized paranasal sinuses are clear. Small left  mastoid effusion noted. Orbits: Globes orbital soft tissues within normal limits. Review of the MIP images confirms the above findings CTA NECK FINDINGS Aortic arch: Visualized aortic arch of normal caliber with normal 3 vessel morphology. No flow-limiting stenosis about the origin of the great vessels. Visualized subclavian arteries widely patent. Right carotid system: Right common carotid artery tortuous proximally but widely patent to the bifurcation without stenosis. Right carotid artery system medialized into the retropharyngeal space. Mixed plaque about the proximal right ICA with associated stenosis of up to 45% by NASCET criteria. Right ICA widely patent distally to the skull base without stenosis, dissection, or occlusion. Left carotid system: Left common carotid artery patent from its origin to the bifurcation without stenosis. Left carotid artery system medialized into the retropharyngeal space. Calcified plaque at the left bifurcation with associated stenosis of approximately 60% by NASCET criteria. Distally, left ICA widely patent to the skull base without stenosis, dissection, or occlusion. Vertebral arteries: Both of the vertebral arteries arise from the subclavian arteries. Focal plaque at the origin of the right vertebral artery without significant stenosis. Vertebral arteries patent within the neck without stenosis, dissection, or occlusion. Skeleton: No acute osseous abnormality identified. Advanced degenerative spondylolysis present at C4-5 through C6-7. Advanced facet arthrosis present throughout the cervical spine, worse on the right. Other neck: No acute soft tissue abnormality within the neck. Salivary glands normal. Subcentimeter hypodense right thyroid nodule noted, of doubtful significance. Thyroid otherwise unremarkable. No adenopathy. Upper chest: Visualized upper chest demonstrates no acute abnormality. Visualized lungs are clear. Review of the MIP images confirms the above findings CTA  HEAD FINDINGS Anterior circulation: Petrous segments patent bilaterally. Scattered atheromatous plaque within the cavernous/supraclinoid ICAs with resultant mild to moderate multifocal narrowing, worse on the right. No high-grade stenosis. ICA termini widely patent. A1 segments patent bilaterally. Normal anterior communicating artery. Anterior cerebral arteries patent to their distal aspects without stenosis. M1 segments patent without stenosis. No proximal M2 occlusion. Prominent atheromatous irregularity throughout the MCA branches bilaterally with several prominent severe distal M3 stenoses, seen on the left on  series 10, image 88), seen on the right on series 10, image 80). Posterior circulation: Atheromatous plaque within the bilateral V4 segments with resultant mild to moderate stenoses, left slightly worse than right. Left vertebral artery slightly dominant. Left PICA patent. Right PICA not well visualized. Basilar artery widely patent to its distal aspect without stenosis. Superior cerebral arteries patent bilaterally. Both of the posterior cerebral arteries primarily supplied via the basilar. Moderate to severe distal left P2 stenosis noted (series 10, image 98). There are severe tandem right P2 stenoses. Distal small vessel atheromatous irregularity. Venous sinuses: Patent. Anatomic variants: None significant.  No intracranial aneurysm. Delayed phase: Not performed. Review of the MIP images confirms the above findings IMPRESSION: 1. Negative CTA for emergent large vessel occlusion. No acute intracranial abnormality identified. 2. 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 3. Moderate to advanced intracranial atherosclerotic changes above with multiple prominent severe bilateral distal MCA branch stenoses, with severe bilateral P2 stenoses. No proximal high-grade or correctable stenosis identified. Results were  called by telephone at the time of interpretation on 12/18/2017 at 12:41 am to Dr. Marjean Donna , who verbally acknowledged these results. Electronically Signed   By: Jeannine Boga M.D.   On: 12/18/2017 01:01   Mr Brain Wo Contrast  Result Date: 12/18/2017 CLINICAL DATA:  Dizziness and expressive aphasia EXAM: MRI HEAD WITHOUT CONTRAST MRA HEAD WITHOUT CONTRAST TECHNIQUE: Multiplanar, multiecho pulse sequences of the brain and surrounding structures were obtained without intravenous contrast. Angiographic images of the head were obtained using MRA technique without contrast. COMPARISON:  CTA head neck 12/17/2017 FINDINGS: MRI HEAD FINDINGS Brain: Incidentally noted is a large development or positive cyst. There is multifocal diffusion restriction involving multiple vascular territories. Largest areas of diffusion abnormality are in the right frontal lobe and left parietal lobe. Smaller areas include the right parietal lobe, right hippocampus and the periphery of the left postcentral gyrus. Diffuse confluent white matter hyperintensity consistent with chronic microvascular ischemia. No mass lesion. Focus of chronic microhemorrhage in the left parietal lobe. No hydrocephalus, age advanced atrophy or lobar predominant volume loss. No dural abnormality or extra-axial collection. Skull and upper cervical spine: The visualized skull base, calvarium, upper cervical spine and extracranial soft tissues are normal. Sinuses/Orbits: No fluid levels or advanced mucosal thickening. No mastoid effusion. Normal orbits. MRA HEAD FINDINGS Intracranial internal carotid arteries: Mild narrowing of the right cavernous segment. Anterior cerebral arteries: Normal. Normal variant hypoplastic right A1 segment. Patent anterior communicating artery. Middle cerebral arteries: Multifocal mild-to-moderate narrowing of both middle cerebral arteries, which is exaggerated by patient motion. Posterior communicating arteries: Absent  bilaterally. Posterior cerebral arteries: Multifocal moderate stenosis of the right P2 segment. Moderate-to-severe left P2 segment stenosis. Basilar artery: Indication Vertebral arteries: Left dominant. Normal. Superior cerebellar arteries: Normal. Anterior inferior cerebellar arteries: Normal. Posterior inferior cerebellar arteries: Below the field of view. IMPRESSION: 1. Multifocal acute to early subacute ischemia within multiple vascular territories, the largest areas of which are in the right frontal and left parietal lobes. Additional of smaller foci of ischemia within the right hippocampus, right parietal lobe and within the left peripheral postcentral gyrus. 2. No hemorrhage or mass effect. 3. Severe chronic microvascular ischemia. 4. Moderate-to-severe stenosis of both posterior cerebral artery P2 segments. 5. Multifocal narrowing of the middle cerebral arteries is exaggerated by patient motion. No proximal occlusion or proximal high-grade stenosis. Electronically Signed   By: Cletus Gash.D.  On: 12/18/2017 16:09   Mr Jodene Nam Head/brain OI Cm  Result Date: 12/18/2017 CLINICAL DATA:  Dizziness and expressive aphasia EXAM: MRI HEAD WITHOUT CONTRAST MRA HEAD WITHOUT CONTRAST TECHNIQUE: Multiplanar, multiecho pulse sequences of the brain and surrounding structures were obtained without intravenous contrast. Angiographic images of the head were obtained using MRA technique without contrast. COMPARISON:  CTA head neck 12/17/2017 FINDINGS: MRI HEAD FINDINGS Brain: Incidentally noted is a large development or positive cyst. There is multifocal diffusion restriction involving multiple vascular territories. Largest areas of diffusion abnormality are in the right frontal lobe and left parietal lobe. Smaller areas include the right parietal lobe, right hippocampus and the periphery of the left postcentral gyrus. Diffuse confluent white matter hyperintensity consistent with chronic microvascular ischemia. No mass  lesion. Focus of chronic microhemorrhage in the left parietal lobe. No hydrocephalus, age advanced atrophy or lobar predominant volume loss. No dural abnormality or extra-axial collection. Skull and upper cervical spine: The visualized skull base, calvarium, upper cervical spine and extracranial soft tissues are normal. Sinuses/Orbits: No fluid levels or advanced mucosal thickening. No mastoid effusion. Normal orbits. MRA HEAD FINDINGS Intracranial internal carotid arteries: Mild narrowing of the right cavernous segment. Anterior cerebral arteries: Normal. Normal variant hypoplastic right A1 segment. Patent anterior communicating artery. Middle cerebral arteries: Multifocal mild-to-moderate narrowing of both middle cerebral arteries, which is exaggerated by patient motion. Posterior communicating arteries: Absent bilaterally. Posterior cerebral arteries: Multifocal moderate stenosis of the right P2 segment. Moderate-to-severe left P2 segment stenosis. Basilar artery: Indication Vertebral arteries: Left dominant. Normal. Superior cerebellar arteries: Normal. Anterior inferior cerebellar arteries: Normal. Posterior inferior cerebellar arteries: Below the field of view. IMPRESSION: 1. Multifocal acute to early subacute ischemia within multiple vascular territories, the largest areas of which are in the right frontal and left parietal lobes. Additional of smaller foci of ischemia within the right hippocampus, right parietal lobe and within the left peripheral postcentral gyrus. 2. No hemorrhage or mass effect. 3. Severe chronic microvascular ischemia. 4. Moderate-to-severe stenosis of both posterior cerebral artery P2 segments. 5. Multifocal narrowing of the middle cerebral arteries is exaggerated by patient motion. No proximal occlusion or proximal high-grade stenosis. Electronically Signed   By: Ulyses Jarred M.D.   On: 12/18/2017 16:09    ASSESSMENT AND PLAN:   Principal Problem:   Expressive aphasia Active  Problems:   Hyperlipidemia   Essential hypertension   Type 2 diabetes mellitus with complication, without long-term current use of insulin (HCC)   Depression   GERD (gastroesophageal reflux disease)   Stroke [facility] [address])    * Expressive aphasia -patient is still experiencing this      Acute stroke   reviewed Echo report. Severe mitral stenosis and calcifications, LVH pattern.   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   HBA1c is high- advised and started on lantus   LDL is high- started on statin.   SLP eval and therapy after discharge   Need TEE, called cardio consult.  * DM   Hold oral meds, start on lantus  * Hyperlipidemia   Start atorvastatin.  * hypertension   Have multiple intracranial stenosis   She will benefit from slightly higher blood pressure, wills top BP meds.      All the records are reviewed and case discussed with Care Management/Social Workerr. Management plans discussed with the patient, family and  they are in agreement.  CODE STATUS: Full.  TOTAL TIME TAKING CARE OF THIS PATIENT: 35 minutes.  Spoke to her daughter about the plan.  POSSIBLE D/C IN 1-2 DAYS, DEPENDING ON CLINICAL CONDITION.   Vaughan Basta M.D on 12/19/2017   Between 7am to 6pm - Pager - [phone]  After 6pm go to [email or web address] - password [email or web address] [facility]  Office  [phone]  CC: Primary care physician; Pleas Koch, NP  Note: This dictation was prepared with Dragon dictation along with smaller phrase technology. Any transcriptional errors that result from this process are unintentional.

## 2017-12-20 ENCOUNTER — Encounter
Admission: EM | Disposition: A | Discharge status: Skilled Nursing Facility | Payer: Self-pay | Source: Home / Self Care | Attending: Internal Medicine

## 2017-12-20 ENCOUNTER — Inpatient Hospital Stay (HOSPITAL_COMMUNITY)
Admit: 2017-12-20 | Discharge: 2017-12-20 | Disposition: A | Discharge status: Home / Self Care | Payer: Medicare Other | Attending: Physician Assistant | Admitting: Physician Assistant

## 2017-12-20 DIAGNOSIS — I342 Nonrheumatic mitral (valve) stenosis: Secondary | ICD-10-CM

## 2017-12-20 DIAGNOSIS — I63429 Cerebral infarction due to embolism of unspecified anterior cerebral artery: Secondary | ICD-10-CM

## 2017-12-20 DIAGNOSIS — R4701 Aphasia: Secondary | ICD-10-CM

## 2017-12-20 DIAGNOSIS — I340 Nonrheumatic mitral (valve) insufficiency: Secondary | ICD-10-CM

## 2017-12-20 HISTORY — PX: TEE WITHOUT CARDIOVERSION: SHX5443

## 2017-12-20 LAB — GLUCOSE, CAPILLARY
GLUCOSE-CAPILLARY: 90 mg/dL (ref 65–99)
GLUCOSE-CAPILLARY: 92 mg/dL (ref 65–99)
Glucose-Capillary: 199 mg/dL — ABNORMAL HIGH (ref 65–99)
Glucose-Capillary: 189 mg/dL — ABNORMAL HIGH (ref 65–99)
Glucose-Capillary: 166 mg/dL — ABNORMAL HIGH (ref 65–99)

## 2017-12-20 LAB — CBC
HCT: 37.1 % (ref 35.0–47.0)
Hemoglobin: 12.9 g/dL (ref 12.0–16.0)
MCH: 31.2 pg (ref 26.0–34.0)
MCHC: 34.8 g/dL (ref 32.0–36.0)
MCV: 89.6 fL (ref 80.0–100.0)
Platelets: 157 10*3/uL (ref 150–440)
RBC: 4.14 MIL/uL (ref 3.80–5.20)
RDW: 12.8 % (ref 11.5–14.5)
WBC: 5 10*3/uL (ref 3.6–11.0)

## 2017-12-20 LAB — BASIC METABOLIC PANEL
Anion gap: 10 (ref 5–15)
BUN: 30 mg/dL — AB (ref 6–20)
CALCIUM: 8.7 mg/dL — AB (ref 8.9–10.3)
CO2: 27 mmol/L (ref 22–32)
Chloride: 100 mmol/L — ABNORMAL LOW (ref 101–111)
Creatinine, Ser: 1.48 mg/dL — ABNORMAL HIGH (ref 0.44–1.00)
GFR calc Af Amer: 39 mL/min — ABNORMAL LOW (ref >60)
GFR, EST NON AFRICAN AMERICAN: 34 mL/min — AB (ref >60)
GLUCOSE: 197 mg/dL — AB (ref 65–99)
POTASSIUM: 3.9 mmol/L (ref 3.5–5.1)
SODIUM: 137 mmol/L (ref 135–145)

## 2017-12-20 LAB — PROTIME-INR
INR: 1.01
PROTHROMBIN TIME: 13.2 s (ref 11.4–15.2)

## 2017-12-20 SURGERY — ECHOCARDIOGRAM, TRANSESOPHAGEAL
Anesthesia: Moderate Sedation

## 2017-12-20 MED ORDER — WARFARIN - PHARMACIST DOSING INPATIENT
Freq: Every day | Status: DC
  Administered 2017-12-20: 18:00:00

## 2017-12-20 MED ORDER — LIDOCAINE VISCOUS 2 % MT SOLN
OROMUCOSAL | Status: AC
  Filled 2017-12-20: qty 15

## 2017-12-20 MED ORDER — WARFARIN SODIUM 5 MG PO TABS
5.0000 mg | ORAL_TABLET | Freq: Every day | ORAL | Status: AC
  Administered 2017-12-20: 5 mg via ORAL
  Filled 2017-12-20: qty 1

## 2017-12-20 MED ORDER — FENTANYL CITRATE (PF) 100 MCG/2ML IJ SOLN
INTRAMUSCULAR | Status: AC | PRN
  Administered 2017-12-20: 50 ug via INTRAVENOUS

## 2017-12-20 MED ORDER — AMLODIPINE BESYLATE 10 MG PO TABS
10.0000 mg | ORAL_TABLET | Freq: Every day | ORAL | Status: DC
  Administered 2017-12-20 – 2017-12-23 (×4): 10 mg via ORAL
  Filled 2017-12-20 (×4): qty 1

## 2017-12-20 MED ORDER — SODIUM CHLORIDE 0.9 % IV SOLN: INTRAVENOUS | Status: DC

## 2017-12-20 MED ORDER — FENTANYL CITRATE (PF) 100 MCG/2ML IJ SOLN
INTRAMUSCULAR | Status: AC
  Filled 2017-12-20: qty 2

## 2017-12-20 MED ORDER — LISINOPRIL 20 MG PO TABS
20.0000 mg | ORAL_TABLET | Freq: Once | ORAL | Status: AC
  Administered 2017-12-20: 20 mg via ORAL
  Filled 2017-12-20: qty 1

## 2017-12-20 MED ORDER — BUTAMBEN-TETRACAINE-BENZOCAINE 2-2-14 % EX AERO
INHALATION_SPRAY | CUTANEOUS | Status: AC
  Filled 2017-12-20: qty 5

## 2017-12-20 MED ORDER — LISINOPRIL 20 MG PO TABS
40.0000 mg | ORAL_TABLET | Freq: Every day | ORAL | Status: DC
  Administered 2017-12-21 – 2017-12-23 (×3): 40 mg via ORAL
  Filled 2017-12-20 (×3): qty 2

## 2017-12-20 MED ORDER — ASPIRIN EC 81 MG PO TBEC
81.0000 mg | DELAYED_RELEASE_TABLET | Freq: Every day | ORAL | Status: DC
  Administered 2017-12-21 – 2017-12-23 (×3): 81 mg via ORAL
  Filled 2017-12-20 (×3): qty 1

## 2017-12-20 MED ORDER — ENOXAPARIN SODIUM 150 MG/ML ~~LOC~~ SOLN
1.0000 mg/kg | Freq: Two times a day (BID) | SUBCUTANEOUS | Status: DC
  Filled 2017-12-20: qty 0.82

## 2017-12-20 MED ORDER — MIDAZOLAM HCL 2 MG/2ML IJ SOLN
INTRAMUSCULAR | Status: AC | PRN
  Administered 2017-12-20: 1 mg via INTRAVENOUS

## 2017-12-20 MED ORDER — SODIUM CHLORIDE FLUSH 0.9 % IV SOLN
INTRAVENOUS | Status: AC
  Filled 2017-12-20: qty 10

## 2017-12-20 MED ORDER — ENOXAPARIN SODIUM 150 MG/ML ~~LOC~~ SOLN
1.0000 mg/kg | Freq: Two times a day (BID) | SUBCUTANEOUS | Status: DC
Start: 2017-12-21 — End: 2017-12-23
  Administered 2017-12-21 – 2017-12-23 (×5): 125 mg via SUBCUTANEOUS
  Filled 2017-12-20 (×6): qty 0.82

## 2017-12-20 MED ORDER — MIDAZOLAM HCL 5 MG/5ML IJ SOLN
INTRAMUSCULAR | Status: AC
  Filled 2017-12-20: qty 5

## 2017-12-20 MED ORDER — ENOXAPARIN SODIUM 150 MG/ML ~~LOC~~ SOLN
1.0000 mg/kg | Freq: Once | SUBCUTANEOUS | Status: AC
  Administered 2017-12-20: 125 mg via SUBCUTANEOUS
  Filled 2017-12-20: qty 0.82

## 2017-12-20 MED ORDER — INSULIN GLARGINE 100 UNIT/ML ~~LOC~~ SOLN
25.0000 [IU] | Freq: Every day | SUBCUTANEOUS | Status: DC
  Administered 2017-12-21 – 2017-12-23 (×3): 25 [IU] via SUBCUTANEOUS
  Filled 2017-12-20 (×4): qty 0.25

## 2017-12-20 NOTE — Progress Notes (Signed)
*  PRELIMINARY RESULTS* Echocardiogram Echocardiogram Transesophageal has been performed.  Megan Obrien 12/20/2017, 9:25 AM

## 2017-12-20 NOTE — NC FL2 (Addendum)
[place] LEVEL OF CARE SCREENING TOOL     IDENTIFICATION  Patient Name: Megan Obrien Birthdate: [birth year] Sex: female Admission Date (Current Location): 12/17/2017  [facility] and [place] Number:  Engineering geologist and Address:  [facility], [address], [place],  [address]      Provider Number: [id number]  Attending Physician Name and Address:  Vaughan Basta, *  Relative Name and Phone Number:  Coe,Damascus Feldpausch Daughter   [phone] or Chandani, Rogowski   [phone] or Karle Plumber   [phone]     Current Level of Care: Hospital Recommended Level of Care: [place] Prior Approval Number:    Date Approved/Denied:   PASRR Number: Pending  Discharge Plan: SNF    Current Diagnoses: Patient Active Problem List   Diagnosis Date Noted  . Expressive aphasia 12/18/2017  . Stroke ([place]) 12/18/2017  . Mitral valve disease   . Exertional dyspnea 04/17/2017  . Fatigue 02/20/2017  . 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 Medicare annual wellness visit, subsequent 07/24/2016  . Hyperlipidemia 02/06/2016  . Essential hypertension 02/06/2016  . Type 2 diabetes mellitus with complication, without long-term current use of insulin ([place]) 02/06/2016  . Depression 02/06/2016  . GERD (gastroesophageal reflux disease) 02/06/2016  . Urge incontinence of urine 02/06/2016    Orientation RESPIRATION BLADDER Height & Weight     Self, Time, Situation, Place  Normal Incontinent Weight: 271 lb 12.8 oz (123.3 kg) Height:  5\' 6"  (167.6 cm)  BEHAVIORAL SYMPTOMS/MOOD NEUROLOGICAL BOWEL NUTRITION STATUS      Continent Diet(Carb Modified)  AMBULATORY STATUS COMMUNICATION OF NEEDS Skin   Limited Assist Verbally Normal                       Personal Care Assistance Level of Assistance  Bathing, Feeding, Dressing Bathing Assistance: Limited assistance Feeding assistance: Independent Dressing Assistance: Limited  assistance     Functional Limitations Info  Sight, Hearing, Speech Sight Info: Adequate Hearing Info: Adequate Speech Info: Adequate    SPECIAL CARE FACTORS FREQUENCY        PT Frequency: 5x a week              Contractures Contractures Info: Not present    Additional Factors Info  Code Status, Allergies, Psychotropic, Insulin Sliding Scale Code Status Info: Full Code Allergies Info: NKA Psychotropic Info: buPROPion (WELLBUTRIN SR) 12 hr tablet 150 mg and FLUoxetine (PROZAC) capsule 40 mg  Insulin Sliding Scale Info: insulin aspart (novoLOG) injection 0-5 Units 3x a day with meals       Current Medications (12/20/2017):  This is the current hospital active medication list Current Facility-Administered Medications  Medication Dose Route Frequency Provider Last Rate Last Dose  . acetaminophen (TYLENOL) tablet 650 mg  650 mg Oral Q4H PRN Lance Coon, MD   650 mg at 12/19/17 1630   Or  . acetaminophen (TYLENOL) solution 650 mg  650 mg Per Tube Q4H PRN Lance Coon, MD       Or  . acetaminophen (TYLENOL) suppository 650 mg  650 mg Rectal Q4H PRN Lance Coon, MD      . Derrill Memo ON 12/21/2017] aspirin EC tablet 81 mg  81 mg Oral Daily Vaughan Basta, MD      . atorvastatin (LIPITOR) tablet 40 mg  40 mg Oral Daily Lance Coon, MD   40 mg at 12/20/17 1207  . buPROPion (WELLBUTRIN SR) 12 hr tablet 150 mg  150 mg Oral BID Willis,  Shanon Brow, MD   150 mg at 12/20/17 1209  . butamben-tetracaine-benzocaine (CETACAINE) 11-02-12 % spray           . carvedilol (COREG) tablet 12.5 mg  12.5 mg Oral BID WC Gouru, Aruna, MD   12.5 mg at 12/20/17 1827  . enoxaparin (LOVENOX) injection 125 mg  1 mg/kg Subcutaneous Q12H Vaughan Basta, MD      . fentaNYL (SUBLIMAZE) 100 MCG/2ML injection           . FLUoxetine (PROZAC) capsule 40 mg  40 mg Oral Daily Lance Coon, MD   40 mg at 12/20/17 1207  . glimepiride (AMARYL) tablet 4 mg  4 mg Oral Q breakfast Vaughan Basta, MD   4  mg at 12/20/17 1207  . insulin aspart (novoLOG) injection 0-5 Units  0-5 Units Subcutaneous QHS Lance Coon, MD   2 Units at 12/18/17 2219  . insulin aspart (novoLOG) injection 0-9 Units  0-9 Units Subcutaneous TID WC Lance Coon, MD   2 Units at 12/20/17 1827  . [START ON 12/21/2017] insulin glargine (LANTUS) injection 25 Units  25 Units Subcutaneous Daily Vaughan Basta, MD      . lidocaine (XYLOCAINE) 2 % viscous mouth solution           . [START ON 12/21/2017] lisinopril (PRINIVIL,ZESTRIL) tablet 40 mg  40 mg Oral Daily Vaughan Basta, MD      . midazolam (VERSED) 5 MG/5ML injection           . oxybutynin (DITROPAN) tablet 5 mg  5 mg Oral TID Lance Coon, MD   5 mg at 12/20/17 1827  . pantoprazole (PROTONIX) EC tablet 40 mg  40 mg Oral Daily Lance Coon, MD   40 mg at 12/20/17 1206  . sodium chloride flush 0.9 % injection           . Warfarin - Pharmacist Dosing Inpatient   Does not apply q1800 Lenis Noon, [facility]         Discharge Medications: Please see discharge summary for a list of discharge medications.  Relevant Imaging Results:  Relevant Lab Results:   Additional Information SSN [id number]  Ross Ludwig, [place]

## 2017-12-20 NOTE — Progress Notes (Signed)
Pt was being assisted back to bed from bsc per nt, pt had sat on bed and somehow slid from bed to floor softly  Being assisted by nt. To lessen fall. Staff  Called and pt was  Assisted back to bed and assessed without findinds for injury able to move all extremities vs b/p elevated  md aware  dtr informed. Pt monitered and will cont to moniter. P.t. To see pt.

## 2017-12-20 NOTE — Discharge Instructions (Signed)
Information on my medicine - Coumadin   (Warfarin)  This medication education was reviewed with me or my healthcare representative as part of my discharge preparation.  The pharmacist that spoke with me during my hospital stay was:  Lenis Noon, [facility]  Why was Coumadin prescribed for you? Coumadin was prescribed for you because you have a blood clot or a medical condition that can cause an increased risk of forming blood clots. Blood clots can cause serious health problems by blocking the flow of blood to the heart, lung, or brain. Coumadin can prevent harmful blood clots from forming. As a reminder your indication for Coumadin is:   Select from menu  What test will check on my response to Coumadin? While on Coumadin (warfarin) you will need to have an INR test regularly to ensure that your dose is keeping you in the desired range. The INR (international normalized ratio) number is calculated from the result of the laboratory test called prothrombin time (PT).  If an INR APPOINTMENT HAS NOT ALREADY BEEN MADE FOR YOU please schedule an appointment to have this lab work done by your health care provider within 7 days. Your INR goal is usually a number between:  2 to 3 or your provider may give you a more narrow range like 2-2.5.  Ask your health care provider during an office visit what your goal INR is.  What  do you need to  know  About  COUMADIN? Take Coumadin (warfarin) exactly as prescribed by your healthcare provider about the same time each day.  DO NOT stop taking without talking to the doctor who prescribed the medication.  Stopping without other blood clot prevention medication to take the place of Coumadin may increase your risk of developing a new clot or stroke.  Get refills before you run out.  What do you do if you miss a dose? If you miss a dose, take it as soon as you remember on the same day then continue your regularly scheduled regimen the next day.  Do not take two doses of  Coumadin at the same time.  Important Safety Information A possible side effect of Coumadin (Warfarin) is an increased risk of bleeding. You should call your healthcare provider right away if you experience any of the following: ? Bleeding from an injury or your nose that does not stop. ? Unusual colored urine (red or dark brown) or unusual colored stools (red or black). ? Unusual bruising for unknown reasons. ? A serious fall or if you hit your head (even if there is no bleeding).  Some foods or medicines interact with Coumadin (warfarin) and might alter your response to warfarin. To help avoid this: ? Eat a balanced diet, maintaining a consistent amount of Vitamin K. ? Notify your provider about major diet changes you plan to make. ? Avoid alcohol or limit your intake to 1 drink for women and 2 drinks for men per day. (1 drink is 5 oz. wine, 12 oz. beer, or 1.5 oz. liquor.)  Make sure that ANY health care provider who prescribes medication for you knows that you are taking Coumadin (warfarin).  Also make sure the healthcare provider who is monitoring your Coumadin knows when you have started a new medication including herbals and non-prescription products.  Coumadin (Warfarin)  Major Drug Interactions  Increased Warfarin Effect Decreased Warfarin Effect  Alcohol (large quantities) Antibiotics (esp. Septra/Bactrim, Flagyl, Cipro) Amiodarone (Cordarone) Aspirin (ASA) Cimetidine (Tagamet) Megestrol (Megace) NSAIDs (ibuprofen, naproxen, etc.) Piroxicam (  Feldene) °Propafenone (Rythmol SR) °Propranolol (Inderal) °Isoniazid (INH) °Posaconazole (Noxafil) Barbiturates (Phenobarbital) °Carbamazepine (Tegretol) °Chlordiazepoxide (Librium) °Cholestyramine (Questran) °Griseofulvin °Oral Contraceptives °Rifampin °Sucralfate (Carafate) °Vitamin K  ° °Coumadin® (Warfarin) Major Herbal Interactions  °Increased Warfarin Effect Decreased Warfarin Effect  °Garlic °Ginseng °Ginkgo biloba Coenzyme Q10 °Green  tea °St. [name]’s wort   ° °Coumadin® (Warfarin) FOOD Interactions  °Eat a consistent number of servings per week of foods HIGH in Vitamin K °(1 serving = ½ cup)  °Collards (cooked, or boiled & drained) °Kale (cooked, or boiled & drained) °Mustard greens (cooked, or boiled & drained) °Parsley *serving size only = ¼ cup °Spinach (cooked, or boiled & drained) °Swiss chard (cooked, or boiled & drained) °Turnip greens (cooked, or boiled & drained)  °Eat a consistent number of servings per week of foods MEDIUM-HIGH in Vitamin K °(1 serving = 1 cup)  °Asparagus (cooked, or boiled & drained) °Broccoli (cooked, boiled & drained, or raw & chopped) °Brussel sprouts (cooked, or boiled & drained) *serving size only = ½ cup °Lettuce, raw (green leaf, endive, romaine) °Spinach, raw °Turnip greens, raw & chopped  ° °These websites have more information on Coumadin (warfarin):  www.coumadin.com; °www.ahrq.gov/consumer/coumadin.htm; ° ° °

## 2017-12-20 NOTE — Consult Note (Signed)
Cardiology Consult    Patient ID: Megan DRISCOLL MRN: [id number], DOB/AGE: 12-22-1945   Admit date: 12/17/2017 Date of Consult: 12/20/2017  Primary Physician: Pleas Koch, NP Primary Cardiologist: [name], MD Requesting Provider: Doylene Canning, MD  Patient Profile    Megan Obrien is a 72 y.o. female with a history of HFpEF, stage III chronic kidney disease, hypertension, hyperlipidemia, mild to moderate mitral stenosis, nonobstructive CAD, morbid obesity, pulmonary hypertension, type 2 diabetes mellitus, and obstructive sleep apnea, who is being seen today for the evaluation of stroke in the setting of LAA thrombus, presumed PAF, and moderate mitral stenosis, at the request of Dr. Anselm Jungling.  Past Medical History   Past Medical History:  Diagnosis Date  . (HFpEF) heart failure with preserved ejection fraction ([place])    a. 03/2017 Echo: EF 55-60%, no rwma, Gr1 DD, mild to mod MS, mod to sev dil LA.  [name] Arthritis   . Carotid arterial disease ([place])    a. 11/2017 CTA Head/Neck: RICA 45, LICA 60.  [name] Cerebrovascular disease    a. 11/2017 MRA: mod to sev stenosis of bilat PCA's P2 segments; b. 11/2017 CTA Head/Neck: RICA 45, LICA 60, sev bilat distal MCA branch stenoses w/ sev bilat P2 stenoses.  . CKD (chronic kidney disease), stage III ([place])   . Depression   . GERD (gastroesophageal reflux disease)   . Hyperlipidemia   . Hypertension   . Left Atrial Appendage Thrombus    a. 11/2017 TEE (performed in setting of stroke/aphasia): nl EF, mod MS, dil LA w/ heavy smoke and small LAA thrombus-->Warfarin initiated.  . Mitral stenosis    a. Mild to moderate by echo 03/2017; b. 03/2017 R/LHC: PCWP 23mmHg, LVEDP 2mmHg. Mean grad of 1mmHg. Valve area of 2.02cm^2; c. 11/2017 TEE: Mod MS.  . Morbid obesity ([place])   . Murmur   . Non-obstructive CAD (coronary artery disease)    a. 03/2017 Cath: mild, non-obstructive CAD.  [name] OSA (obstructive sleep apnea)    a. Previously wore CPAP for  several years but stopped on her own and now just uses O2 via [place] @ night.  [name] PAH (pulmonary artery hypertension) ([place])    a. 03/2017 RHC: PA 69mmHg.  . Seasonal allergies   . Stroke [facility])    a. 11/2017 - presented w/ aphasia->MRI acute to early subacute ischemia w/in multiple vascular territories, largest in R frontal & L parietal lobes. Smaller foci of ischemia w/in R hippocampus, R parietal lobe, and L peripheral postcentral gyrus.  . Type 2 diabetes mellitus ([place])   . Urinary incontinence     Past Surgical History:  Procedure Laterality Date  . BACK SURGERY    . CARDIAC CATHETERIZATION    . KNEE SURGERY Left   . RIGHT/LEFT HEART CATH AND CORONARY ANGIOGRAPHY Bilateral 04/25/2017   Procedure: Right/Left Heart Cath and Coronary Angiography;  Surgeon: Wellington Hampshire, MD;  Location: [facility];  Service: Cardiovascular;  Laterality: Bilateral;  . TONSILLECTOMY AND ADENOIDECTOMY  1951     Allergies  No Known Allergies  History of Present Illness    72 y/o ? with the above complex PMH including HFpEF, CKD III, HTN, HL, mitral stenosis (now moderate), nonobs CAD, obesity, PAH, DM II, and OSA.  She was worked up for mitral stenosis in the summer of 2018, with right and left heart cath revealing nonobs CAD and moderately elevated R heart filling pressures and severe pulmonary HTN.  She was placed on lasix following  this and was last seen in clinic in 06/2017, at which time she was hypertensive and her  blocker was titrated.  She says that she did reasonably well following that visit.  She lives locally by herself but has family nearby.  She is very sedentary.  She uses O2 @ night.  She does not typically experience DOE, though activity is limited.  She has chronic stable 2 pillow orthopnea.  She notes that she falls about once a month, usually in the middle of the night after awakening to use the bathroom.  She has struck her head several times on bedroom furniture. She has never  sustained any significant trauma.  She was in her usoh until 3/19, when she felt dizzy and actually fell out of bed.  She then noted expressive aphasia that persisted throughout the day, eventually prompting her to present to the ED that evening.  There, she was worked up for stroke and admitted by the medicine team.  MRI of the brain showed R frontal and L parietal ischemia w/ smaller foci of ischemia in the R hippocampus, R parietal lobe, and L peripheral postcentral gyrus.  CTA of head/neck showed moderate bilateral carotid dzs and severe distal MCA branch stenoses w/ sev bilat P2 stenoses.  TEE was performed this AM and has shown nl LV fxn w/ moderate mitral stenosis, dilated LA, and heavy LA smoke w/ small LAA thrombus.  In that setting, warfarin has been initiated with strong presumption of occult PAF.  Megan Obrien is currently feeling well.  Though her speech is currently clear, she says that when she talks to family members on the phone, she still feels as though she can't get some words out.  She denies c/p, dyspnea, palpitations, presyncope, syncope, n, v, edema, or early satiety.  She does report a h/o intermittent palpitations but nothing recently.  She has been maintaining sinus rhythm w/ occasional PACs on tele.   Inpatient Medications    . aspirin EC  325 mg Oral Daily  . atorvastatin  40 mg Oral Daily  . buPROPion  150 mg Oral BID  . butamben-tetracaine-benzocaine      . carvedilol  12.5 mg Oral BID WC  . enoxaparin (LOVENOX) injection  1 mg/kg Subcutaneous Once  . enoxaparin (LOVENOX) injection  1 mg/kg Subcutaneous Q12H  . fentaNYL      . FLUoxetine  40 mg Oral Daily  . glimepiride  4 mg Oral Q breakfast  . insulin aspart  0-5 Units Subcutaneous QHS  . insulin aspart  0-9 Units Subcutaneous TID WC  . [START ON 12/21/2017] insulin glargine  25 Units Subcutaneous Daily  . lidocaine      . lisinopril  20 mg Oral Daily  . midazolam      . oxybutynin  5 mg Oral TID  . pantoprazole   40 mg Oral Daily  . sodium chloride flush      . warfarin  5 mg Oral q1800  . Warfarin - Pharmacist Dosing Inpatient   Does not apply q1800    Family History    Family History  Problem Relation Age of Onset  . Lung cancer Father   . Alzheimer's disease Mother   . Stroke Maternal Grandfather    indicated that her mother is deceased. She indicated that her father is deceased. She indicated that her maternal grandfather is deceased.   Social History    Social History   Socioeconomic History  . Marital status: Widowed    Spouse  name: Not on file  . Number of children: Not on file  . Years of education: Not on file  . Highest education level: Not on file  Occupational History  . Not on file  Social Needs  . Financial resource strain: Not hard at all  . Food insecurity:    Worry: Patient refused    Inability: Patient refused  . Transportation needs:    Medical: Patient refused    Non-medical: Patient refused  Tobacco Use  . Smoking status: Never Smoker  . Smokeless tobacco: Never Used  Substance and Sexual Activity  . Alcohol use: No    Alcohol/week: 0.0 oz    Comment: rarely  . Drug use: No  . Sexual activity: Never  Lifestyle  . Physical activity:    Days per week: Patient refused    Minutes per session: Patient refused  . Stress: Only a little  Relationships  . Social connections:    Talks on phone: Patient refused    Gets together: Patient refused    Attends religious service: Patient refused    Active member of club or organization: Patient refused    Attends meetings of clubs or organizations: Patient refused    Relationship status: Patient refused  . Intimate partner violence:    Fear of current or ex partner: Patient refused    Emotionally abused: Patient refused    Physically abused: Patient refused    Forced sexual activity: Patient refused  Other Topics Concern  . Not on file  Social History Narrative   Widow.   2 children, numerous  grandchildren.   Retired. Once worked on a Publishing copy.   Enjoys playing computer games, spending time with family.     Review of Systems    General:  No chills, fever, night sweats or weight changes.  Cardiovascular:  No chest pain, +++ dyspnea on exertion (exertion overall is limited though), no edema, chronic 2 pillow orthopnea, no palpitations, paroxysmal nocturnal dyspnea. Dermatological: No rash, lesions/masses Respiratory: No cough, +++ dyspnea Urologic: No hematuria, dysuria Abdominal:   No nausea, vomiting, diarrhea, bright red blood per rectum, melena, or hematemesis Neurologic: +++ aphasia. No visual changes, wkns, changes in mental status. All other systems reviewed and are otherwise negative except as noted above.  Physical Exam    Blood pressure (!) 206/83, pulse 71, temperature 98.6 F (37 C), temperature source Oral, resp. rate (!) 21, height 5\' 6"  (1.676 m), weight 271 lb 12.8 oz (123.3 kg), SpO2 100 %.  General: Pleasant, NAD Psych: Normal affect. Neuro: Alert and oriented X 3. Moves all extremities spontaneously. HEENT: Normal  Neck: Supple without bruits or JVD. Lungs:  Resp regular and unlabored, CTA. Heart: RRR no s3, s4, 2/6 syst murmur @ upper sternal border.  I do not appreciate a diast murmur. Abdomen: Soft, non-tender, non-distended, BS + x 4.  Extremities: No clubbing, cyanosis or edema. DP/PT/Radials 2+ and equal bilaterally.  Labs     Recent Labs    12/17/17 2347  TROPONINI <0.03   Lab Results  Component Value Date   WBC 5.0 12/20/2017   HGB 12.9 12/20/2017   HCT 37.1 12/20/2017   MCV 89.6 12/20/2017   PLT 157 12/20/2017    Recent Labs  Lab 12/17/17 2347  12/20/17 1213  NA 135   < > 137  K 4.1   < > 3.9  CL 98*   < > 100*  CO2 29   < > 27  BUN 31*   < >  30*  CREATININE 1.60*   < > 1.48*  CALCIUM 9.2   < > 8.7*  PROT 7.4  --   --   BILITOT 0.6  --   --   ALKPHOS 75  --   --   ALT 13*  --   --   AST 20  --   --   GLUCOSE 404*    < > 197*   < > = values in this interval not displayed.   Lab Results  Component Value Date   CHOL 231 (H) 12/18/2017   HDL 43 12/18/2017   LDLCALC 145 (H) 12/18/2017   TRIG 213 (H) 12/18/2017   No results found for: [facility]   Radiology Studies    Ct Angio Head W Or Wo Contrast  Result Date: 12/18/2017 CLINICAL DATA:  Initial evaluation for acute expressive aphasia, vertical nystagmus. EXAM: CT ANGIOGRAPHY HEAD AND NECK. TECHNIQUE: Multidetector CT imaging of the head and neck was performed using the standard protocol during bolus administration of intravenous contrast. Multiplanar CT image reconstructions and MIPs were obtained to evaluate the vascular anatomy. Carotid stenosis measurements (when applicable) are obtained utilizing NASCET criteria, using the distal internal carotid diameter as the denominator. CONTRAST:  54mL ISOVUE-370 IOPAMIDOL (ISOVUE-370) INJECTION 76% COMPARISON:  Prior CT from 10/10/2016. FINDINGS: CT HEAD FINDINGS Brain: Cerebral atrophy with chronic small vessel ischemic disease, progressed relative to 2018. Small remote subcortical right frontal infarct noted. No acute intracranial hemorrhage. No acute large vessel territory infarct. No mass lesion, midline shift or mass effect. No hydrocephalus. 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 No extra-axial fluid collection. Vascular: No hyperdense vessel. Calcified atherosclerosis present at the skull base. Skull: Scalp soft tissues and calvarium within normal limits. Sinuses: Visualized paranasal sinuses are clear. Small left mastoid effusion noted. Orbits: Globes orbital soft tissues within normal limits. Review of the MIP images confirms the above findings CTA NECK FINDINGS Aortic arch: Visualized aortic arch of normal caliber with normal 3 vessel morphology. No flow-limiting stenosis about the origin of the great vessels. Visualized subclavian arteries widely patent. Right carotid system: Right common carotid artery  tortuous proximally but widely patent to the bifurcation without stenosis. Right carotid artery system medialized into the retropharyngeal space. Mixed plaque about the proximal right ICA with associated stenosis of up to 45% by NASCET criteria. Right ICA widely patent distally to the skull base without stenosis, dissection, or occlusion. Left carotid system: Left common carotid artery patent from its origin to the bifurcation without stenosis. Left carotid artery system medialized into the retropharyngeal space. Calcified plaque at the left bifurcation with associated stenosis of approximately 60% by NASCET criteria. Distally, left ICA widely patent to the skull base without stenosis, dissection, or occlusion. Vertebral arteries: Both of the vertebral arteries arise from the subclavian arteries. Focal plaque at the origin of the right vertebral artery without significant stenosis. Vertebral arteries patent within the neck without stenosis, dissection, or occlusion. Skeleton: No acute osseous abnormality identified. Advanced degenerative spondylolysis present at C4-5 through C6-7. Advanced facet arthrosis present throughout the cervical spine, worse on the right. Other neck: No acute soft tissue abnormality within the neck. Salivary glands normal. Subcentimeter hypodense right thyroid nodule noted, of doubtful significance. Thyroid otherwise unremarkable. No adenopathy. Upper chest: Visualized upper chest demonstrates no acute abnormality. Visualized lungs are clear. Review of the MIP images confirms the above findings CTA HEAD FINDINGS Anterior circulation: Petrous segments patent bilaterally. Scattered atheromatous plaque within the cavernous/supraclinoid ICAs with resultant mild to moderate  multifocal narrowing, worse on the right. No high-grade stenosis. ICA termini widely patent. A1 segments patent bilaterally. Normal anterior communicating artery. Anterior cerebral arteries patent to their distal aspects  without stenosis. M1 segments patent without stenosis. No proximal M2 occlusion. Prominent atheromatous irregularity throughout the MCA branches bilaterally with several prominent severe distal M3 stenoses, seen on the left on series 10, image 88), seen on the right on series 10, image 80). Posterior circulation: Atheromatous plaque within the bilateral V4 segments with resultant mild to moderate stenoses, left slightly worse than right. Left vertebral artery slightly dominant. Left PICA patent. Right PICA not well visualized. Basilar artery widely patent to its distal aspect without stenosis. Superior cerebral arteries patent bilaterally. Both of the posterior cerebral arteries primarily supplied via the basilar. Moderate to severe distal left P2 stenosis noted (series 10, image 98). There are severe tandem right P2 stenoses. Distal small vessel atheromatous irregularity. Venous sinuses: Patent. Anatomic variants: None significant.  No intracranial aneurysm. Delayed phase: Not performed. Review of the MIP images confirms the above findings IMPRESSION: 1. Negative CTA for emergent large vessel occlusion. No acute intracranial abnormality identified. 2. 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 3. Moderate to advanced intracranial atherosclerotic changes above with multiple prominent severe bilateral distal MCA branch stenoses, with severe bilateral P2 stenoses. No proximal high-grade or correctable stenosis identified. Results were called by telephone at the time of interpretation on 12/18/2017 at 12:41 am to Dr. Marjean Donna , who verbally acknowledged these results. Electronically Signed   By: Jeannine Boga M.D.   On: 12/18/2017 01:01   Ct Angio Neck W And/or Wo Contrast  Result Date: 12/18/2017 CLINICAL DATA:  Initial evaluation for acute expressive aphasia, vertical nystagmus. EXAM: CT ANGIOGRAPHY HEAD  AND NECK. TECHNIQUE: Multidetector CT imaging of the head and neck was performed using the standard protocol during bolus administration of intravenous contrast. Multiplanar CT image reconstructions and MIPs were obtained to evaluate the vascular anatomy. Carotid stenosis measurements (when applicable) are obtained utilizing NASCET criteria, using the distal internal carotid diameter as the denominator. CONTRAST:  52mL ISOVUE-370 IOPAMIDOL (ISOVUE-370) INJECTION 76% COMPARISON:  Prior CT from 10/10/2016. FINDINGS: CT HEAD FINDINGS Brain: Cerebral atrophy with chronic small vessel ischemic disease, progressed relative to 2018. Small remote subcortical right frontal infarct noted. No acute intracranial hemorrhage. No acute large vessel territory infarct. No mass lesion, midline shift or mass effect. No hydrocephalus. 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 No extra-axial fluid collection. Vascular: No hyperdense vessel. Calcified atherosclerosis present at the skull base. Skull: Scalp soft tissues and calvarium within normal limits. Sinuses: Visualized paranasal sinuses are clear. Small left mastoid effusion noted. Orbits: Globes orbital soft tissues within normal limits. Review of the MIP images confirms the above findings CTA NECK FINDINGS Aortic arch: Visualized aortic arch of normal caliber with normal 3 vessel morphology. No flow-limiting stenosis about the origin of the great vessels. Visualized subclavian arteries widely patent. Right carotid system: Right common carotid artery tortuous proximally but widely patent to the bifurcation without stenosis. Right carotid artery system medialized into the retropharyngeal space. Mixed plaque about the proximal right ICA with associated stenosis of up to 45% by NASCET criteria. Right ICA widely patent distally to the skull base without stenosis, dissection, or occlusion. Left carotid system: Left common carotid artery patent from its origin to the bifurcation  without stenosis. Left carotid artery system medialized into the retropharyngeal space. Calcified  plaque at the left bifurcation with associated stenosis of approximately 60% by NASCET criteria. Distally, left ICA widely patent to the skull base without stenosis, dissection, or occlusion. Vertebral arteries: Both of the vertebral arteries arise from the subclavian arteries. Focal plaque at the origin of the right vertebral artery without significant stenosis. Vertebral arteries patent within the neck without stenosis, dissection, or occlusion. Skeleton: No acute osseous abnormality identified. Advanced degenerative spondylolysis present at C4-5 through C6-7. Advanced facet arthrosis present throughout the cervical spine, worse on the right. Other neck: No acute soft tissue abnormality within the neck. Salivary glands normal. Subcentimeter hypodense right thyroid nodule noted, of doubtful significance. Thyroid otherwise unremarkable. No adenopathy. Upper chest: Visualized upper chest demonstrates no acute abnormality. Visualized lungs are clear. Review of the MIP images confirms the above findings CTA HEAD FINDINGS Anterior circulation: Petrous segments patent bilaterally. Scattered atheromatous plaque within the cavernous/supraclinoid ICAs with resultant mild to moderate multifocal narrowing, worse on the right. No high-grade stenosis. ICA termini widely patent. A1 segments patent bilaterally. Normal anterior communicating artery. Anterior cerebral arteries patent to their distal aspects without stenosis. M1 segments patent without stenosis. No proximal M2 occlusion. Prominent atheromatous irregularity throughout the MCA branches bilaterally with several prominent severe distal M3 stenoses, seen on the left on series 10, image 88), seen on the right on series 10, image 80). Posterior circulation: Atheromatous plaque within the bilateral V4 segments with resultant mild to moderate stenoses, left slightly worse than  right. Left vertebral artery slightly dominant. Left PICA patent. Right PICA not well visualized. Basilar artery widely patent to its distal aspect without stenosis. Superior cerebral arteries patent bilaterally. Both of the posterior cerebral arteries primarily supplied via the basilar. Moderate to severe distal left P2 stenosis noted (series 10, image 98). There are severe tandem right P2 stenoses. Distal small vessel atheromatous irregularity. Venous sinuses: Patent. Anatomic variants: None significant.  No intracranial aneurysm. Delayed phase: Not performed. Review of the MIP images confirms the above findings IMPRESSION: 1. Negative CTA for emergent large vessel occlusion. No acute intracranial abnormality identified. 2. 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 3. Moderate to advanced intracranial atherosclerotic changes above with multiple prominent severe bilateral distal MCA branch stenoses, with severe bilateral P2 stenoses. No proximal high-grade or correctable stenosis identified. Results were called by telephone at the time of interpretation on 12/18/2017 at 12:41 am to Dr. Marjean Donna , who verbally acknowledged these results. Electronically Signed   By: Jeannine Boga M.D.   On: 12/18/2017 01:01   Mr Brain Wo Contrast  Result Date: 12/18/2017 CLINICAL DATA:  Dizziness and expressive aphasia EXAM: MRI HEAD WITHOUT CONTRAST MRA HEAD WITHOUT CONTRAST TECHNIQUE: Multiplanar, multiecho pulse sequences of the brain and surrounding structures were obtained without intravenous contrast. Angiographic images of the head were obtained using MRA technique without contrast. COMPARISON:  CTA head neck 12/17/2017 FINDINGS: MRI HEAD FINDINGS Brain: Incidentally noted is a large development or positive cyst. There is multifocal diffusion restriction involving multiple vascular territories. Largest areas of  diffusion abnormality are in the right frontal lobe and left parietal lobe. Smaller areas include the right parietal lobe, right hippocampus and the periphery of the left postcentral gyrus. Diffuse confluent white matter hyperintensity consistent with chronic microvascular ischemia. No mass lesion. Focus of chronic microhemorrhage in the left parietal lobe. No hydrocephalus, age advanced atrophy or lobar predominant volume loss. No dural abnormality or extra-axial collection. Skull and  upper cervical spine: The visualized skull base, calvarium, upper cervical spine and extracranial soft tissues are normal. Sinuses/Orbits: No fluid levels or advanced mucosal thickening. No mastoid effusion. Normal orbits. MRA HEAD FINDINGS Intracranial internal carotid arteries: Mild narrowing of the right cavernous segment. Anterior cerebral arteries: Normal. Normal variant hypoplastic right A1 segment. Patent anterior communicating artery. Middle cerebral arteries: Multifocal mild-to-moderate narrowing of both middle cerebral arteries, which is exaggerated by patient motion. Posterior communicating arteries: Absent bilaterally. Posterior cerebral arteries: Multifocal moderate stenosis of the right P2 segment. Moderate-to-severe left P2 segment stenosis. Basilar artery: Indication Vertebral arteries: Left dominant. Normal. Superior cerebellar arteries: Normal. Anterior inferior cerebellar arteries: Normal. Posterior inferior cerebellar arteries: Below the field of view. IMPRESSION: 1. Multifocal acute to early subacute ischemia within multiple vascular territories, the largest areas of which are in the right frontal and left parietal lobes. Additional of smaller foci of ischemia within the right hippocampus, right parietal lobe and within the left peripheral postcentral gyrus. 2. No hemorrhage or mass effect. 3. Severe chronic microvascular ischemia. 4. Moderate-to-severe stenosis of both posterior cerebral artery P2 segments. 5.  Multifocal narrowing of the middle cerebral arteries is exaggerated by patient motion. No proximal occlusion or proximal high-grade stenosis. Electronically Signed   By: Ulyses Jarred M.D.   On: 12/18/2017 16:09   Mr Jodene Nam Head/brain BP Cm  Result Date: 12/18/2017 CLINICAL DATA:  Dizziness and expressive aphasia EXAM: MRI HEAD WITHOUT CONTRAST MRA HEAD WITHOUT CONTRAST TECHNIQUE: Multiplanar, multiecho pulse sequences of the brain and surrounding structures were obtained without intravenous contrast. Angiographic images of the head were obtained using MRA technique without contrast. COMPARISON:  CTA head neck 12/17/2017 FINDINGS: MRI HEAD FINDINGS Brain: Incidentally noted is a large development or positive cyst. There is multifocal diffusion restriction involving multiple vascular territories. Largest areas of diffusion abnormality are in the right frontal lobe and left parietal lobe. Smaller areas include the right parietal lobe, right hippocampus and the periphery of the left postcentral gyrus. Diffuse confluent white matter hyperintensity consistent with chronic microvascular ischemia. No mass lesion. Focus of chronic microhemorrhage in the left parietal lobe. No hydrocephalus, age advanced atrophy or lobar predominant volume loss. No dural abnormality or extra-axial collection. Skull and upper cervical spine: The visualized skull base, calvarium, upper cervical spine and extracranial soft tissues are normal. Sinuses/Orbits: No fluid levels or advanced mucosal thickening. No mastoid effusion. Normal orbits. MRA HEAD FINDINGS Intracranial internal carotid arteries: Mild narrowing of the right cavernous segment. Anterior cerebral arteries: Normal. Normal variant hypoplastic right A1 segment. Patent anterior communicating artery. Middle cerebral arteries: Multifocal mild-to-moderate narrowing of both middle cerebral arteries, which is exaggerated by patient motion. Posterior communicating arteries: Absent  bilaterally. Posterior cerebral arteries: Multifocal moderate stenosis of the right P2 segment. Moderate-to-severe left P2 segment stenosis. Basilar artery: Indication Vertebral arteries: Left dominant. Normal. Superior cerebellar arteries: Normal. Anterior inferior cerebellar arteries: Normal. Posterior inferior cerebellar arteries: Below the field of view. IMPRESSION: 1. Multifocal acute to early subacute ischemia within multiple vascular territories, the largest areas of which are in the right frontal and left parietal lobes. Additional of smaller foci of ischemia within the right hippocampus, right parietal lobe and within the left peripheral postcentral gyrus. 2. No hemorrhage or mass effect. 3. Severe chronic microvascular ischemia. 4. Moderate-to-severe stenosis of both posterior cerebral artery P2 segments. 5. Multifocal narrowing of the middle cerebral arteries is exaggerated by patient motion. No proximal occlusion or proximal high-grade stenosis. Electronically Signed   By: Cletus Gash.D.  On: 12/18/2017 16:09    ECG & Cardiac Imaging    No ECG this admission.  2D Echocardiogram 3.20.2019  Study Conclusions  - Left ventricle: The cavity size was normal. Wall thickness was   increased in a pattern of moderate LVH. Mitral stenosis precludes   accurate assessment of diastolic function. - Aortic valve: Trileaflet; mildly thickened, mildly calcified   leaflets. - Mitral valve: Severely calcified annulus. Transvalvular velocity   was increased. The findings are consistent with moderate   stenosis, most likely due to bulky mitral annular calcification   rather than rheumatic changes. Valve area by pressure half-time:   1.98 cm^2. Valve area by continuity equation (using LVOT flow):   1.26 cm^2. - Left atrium: The atrium was moderately to severely dilated. - Right ventricle: The cavity size was normal. Systolic function   was normal. - Right atrium: The atrium was moderately  dilated. _____________   Transesophageal Echocardiogram 3.22.2019  TEE was done without complications. It showed normal EF, moderate mitral stenosis with dilated left atrium with heavy smoke and small thrombus in left atrial appendage.  I suspect the patient has underlying paroxysmal A-fib in setting of moderate mitral stenosis.  Recommend anticoagulation with Warfarin (NOACs are not recommended in setting of moderate mitral stenosis). We can manage INR in our anticoagulation clinic.   Assessment & Plan    1.  Stroke: Pt presented with expressive aphasia and dizziness and was found to have multiple areas of ischemia on MRI with evidence of cerebrovascular dzs.  TEE this AM revealed a LAA thrombus.  This is most likely explainable by PAF, though she has never been shown to have AFib.  Sinus with PACs on tele since admission.  Regardless, in the setting of LAA thrombus, we have initiated coumadin therapy (moderate Mitral Stenosis precludes usage of DOAC).  I will arrange for f/u in our coumadin clinic next week.  Cont statin.  2.  LAA thrombus/Presumed Paroxysmal AFib:  As above, no documented h/o Afib, though she does have a h/o palpitations.  Starting coumadin.  Consider d/c'ing or reducing ASA unless felt to be necessary by neuro.  3.  Essential HTN:  BP markedly elevated this afternoon.  Cont  blocker.  Acei increased this afternoon to 50 daily.  Will likely need additional agent - can consider amlodipine tomorrow if not improved.  4.  DM II:  Poorly controlled - A1c 11.4.  Insulin per IM.  5.  PAH/OSA:  Uses O2 @ night.  Does not tolerate CPAP.  6.  CKD III:  Stable on acei.  7.  Moderate Mitral Stenosis:  Stable.  Follow as outpt.  Signed, Murray Hodgkins, NP 12/20/2017, 2:24 PM  For questions or updates, please contact   Please consult [email or web address] for contact info under Cardiology/STEMI.

## 2017-12-20 NOTE — Progress Notes (Signed)
Physical Therapy Treatment Patient Details Name: Megan Obrien MRN: [id number] DOB: 12/08/1945 Today's Date: 12/20/2017    History of Present Illness 72 y.o. female who presents with dizziness and expressive aphasia. Admitted 12/17/17 for stroke work up.  PMH includes heart failure, CKD, htn, OSA, PAH, DM, back and knee surgery.    PT Comments    Pt requiring mod assist to stand with SW from bed but CGA from [facility] (over toilet) using SW and L grab bar.  Pt able to ambulate 25 feet x2 CGA with SW to/from bathroom (attempted to ambulate further but pt appearing fatigued and requesting to sit instead).  Increased effort noted to perform all activities during session.  Pt reports sliding off of bed earlier today when performing a transfer.  Currently pt demonstrating generalized weakness and decreased activity tolerance compared to baseline and would benefit from STR to increase strength, improve functional mobility, and decrease risk of falling.      Follow Up Recommendations  SNF     Equipment Recommendations  Standard walker    Recommendations for Other Services       Precautions / Restrictions Precautions Precautions: Fall Restrictions Weight Bearing Restrictions: No    Mobility  Bed Mobility Overal bed mobility: Needs Assistance Bed Mobility: Supine to Sit;Sit to Supine     Supine to sit: Supervision Sit to supine: Supervision   General bed mobility comments: bed flat; increased effort and time to perform on own  Transfers Overall transfer level: Needs assistance Equipment used: Standard walker Transfers: Sit to/from Stand Sit to Stand: (see transfer comments below)         General transfer comment: mod assist standing from bed (pt pulling up with hand hold assist like she does at home when she needs assist); CGA from [facility] over toilet using grab bar and SW  Ambulation/Gait Ambulation/Gait assistance: Min guard Ambulation Distance (Feet): (25 feet x2 (to bathroom  and back)) Assistive device: Standard walker Gait Pattern/deviations: Step-to pattern Gait velocity: decreased   General Gait Details: antalgic; decreased stance time R LE (d/t R>L knee pain); limited distance d/t fatigue   Stairs            Wheelchair Mobility    Modified Rankin (Stroke Patients Only)       Balance Overall balance assessment: Needs assistance;History of Falls Sitting-balance support: No upper extremity supported;Feet supported Sitting balance-Leahy Scale: Good Sitting balance - Comments: steady sitting reaching within BOS   Standing balance support: Bilateral upper extremity supported Standing balance-Leahy Scale: Poor Standing balance comment: requires B UE support for ambulation                            Cognition Arousal/Alertness: Awake/alert Behavior During Therapy: WFL for tasks assessed/performed Overall Cognitive Status: Within Functional Limits for tasks assessed                                        Exercises      General Comments General comments (skin integrity, edema, etc.): Pt resting in bed upon PT entry.  Nursing cleared pt for participation in physical therapy.  Pt agreeable to PT session.  Pt's daughter and son present during session.      Pertinent Vitals/Pain Pain Assessment: 0-10 Pain Score: 4  Pain Location: R knee Pain Descriptors / Indicators: Aching Pain Intervention(s): Limited activity within patient's  tolerance;Monitored during session;Repositioned    Home Living                      Prior Function            PT Goals (current goals can now be found in the care plan section) Acute Rehab PT Goals Patient Stated Goal: to go home PT Goal Formulation: With patient Time For Goal Achievement: 01/01/18 Potential to Achieve Goals: Fair Progress towards PT goals: Progressing toward goals    Frequency    7X/week      PT Plan Discharge plan needs to be updated(MD  notified)    Co-evaluation              AM-PAC PT "6 Clicks" Daily Activity  Outcome Measure  Difficulty turning over in bed (including adjusting bedclothes, sheets and blankets)?: A Little Difficulty moving from lying on back to sitting on the side of the bed? : A Little Difficulty sitting down on and standing up from a chair with arms (e.g., wheelchair, bedside commode, etc,.)?: Unable Help needed moving to and from a bed to chair (including a wheelchair)?: A Lot Help needed walking in hospital room?: A Little Help needed climbing 3-5 steps with a railing? : Total 6 Click Score: 13    End of Session Equipment Utilized During Treatment: Gait belt Activity Tolerance: Patient limited by fatigue Patient left: in bed;with call bell/phone within reach;with bed alarm set;with family/visitor present Nurse Communication: Mobility status;Precautions PT Visit Diagnosis: Other abnormalities of gait and mobility (R26.89);Muscle weakness (generalized) (M62.81);History of falling (Z91.81);Repeated falls (R29.6);Difficulty in walking, not elsewhere classified (R26.2)     Time: 1422-1500 PT Time Calculation (min) (ACUTE ONLY): 38 min  Charges:  $Gait Training: 8-22 mins $Therapeutic Exercise: 8-22 mins $Therapeutic Activity: 8-22 mins                    G CodesLeitha Bleak, PT 12/20/17, 4:31 PM [phone]

## 2017-12-20 NOTE — Progress Notes (Signed)
Subjective: Patient slid off the bed toady to the floor.  No injury.  Has under gone TEE.  Objective: Current vital signs: BP (!) 206/83   Pulse 71   Temp 98.6 F (37 C) (Oral)   Resp (!) 21   Ht 5\' 6"  (1.676 m)   Wt 123.3 kg (271 lb 12.8 oz)   SpO2 100%   BMI 43.87 kg/m  Vital signs in last 24 hours: Temp:  [97.9 F (36.6 C)-98.7 F (37.1 C)] 98.6 F (37 C) (03/22 0727) Pulse Rate:  [60-100] 71 (03/22 0910) Resp:  [13-21] 21 (03/22 0910) BP: (159-217)/(76-197) 206/83 (03/22 0910) SpO2:  [95 %-100 %] 100 % (03/22 0910)  Intake/Output from previous day: 03/21 0701 - 03/22 0700 In: 240 [P.O.:240] Out: 700 [Urine:700] Intake/Output this shift: No intake/output data recorded. Nutritional status: Fall precautions  Neurologic Exam: Mental Status: Alert, oriented, thought content appropriate. Speech fluent without evidence of aphasia. Able to follow 3 step commands without difficulty. Cranial Nerves: II: Discs flat bilaterally; Visual fields grossly normal, pupils equal, round, reactive to light and accommodation III,IV, VI: ptosis not present, extra-ocular motions intact bilaterally V,VII: smile symmetric, facial light touch sensation normal bilaterally VIII: hearing normal bilaterally IX,X: gag reflex present XI: bilateral shoulder shrug XII: midline tongue extension Motor: Patient able to move all extremities against gravity   Lab Results: Basic Metabolic Panel: Recent Labs  Lab 12/17/17 2347 12/18/17 0642 12/19/17 0320  NA 135  --  137  K 4.1  --  3.7  CL 98*  --  102  CO2 29  --  27  GLUCOSE 404*  --  226*  BUN 31*  --  28*  CREATININE 1.60* 1.57* 1.46*  CALCIUM 9.2  --  8.7*    Liver Function Tests: Recent Labs  Lab 12/17/17 2347  AST 20  ALT 13*  ALKPHOS 75  BILITOT 0.6  PROT 7.4  ALBUMIN 3.4*   No results for input(s): LIPASE, AMYLASE in the last 168 hours. No results for input(s): AMMONIA in the last 168 hours.  CBC: Recent Labs   Lab 12/17/17 2347 12/18/17 0642  WBC 6.6 9.4  NEUTROABS 4.8  --   HGB 13.3 12.5  HCT 37.9 36.0  MCV 89.1 89.5  PLT 170 173    Cardiac Enzymes: Recent Labs  Lab 12/17/17 2347  TROPONINI <0.03    Lipid Panel: Recent Labs  Lab 12/18/17 0642  CHOL 231*  TRIG 213*  HDL 43  CHOLHDL 5.4  VLDL 43*  LDLCALC 145*    CBG: Recent Labs  Lab 12/19/17 0741 12/19/17 1137 12/19/17 1647 12/19/17 2114 12/20/17 0738  GLUCAP 138* 240* 269* 157* 199*    Microbiology: Results for orders placed or performed in visit on 07/24/16  Urine culture     Status: None   Collection Time: 07/24/16  1:11 PM  Result Value Ref Range Status   Culture KLEBSIELLA PNEUMONIAE  Final   Colony Count 50,000-100,000 CFU/mL  Final   Organism ID, Bacteria KLEBSIELLA PNEUMONIAE  Final      Susceptibility   Klebsiella pneumoniae -  (no method available)    AMPICILLIN >=32 Resistant     AMOX/CLAVULANIC <=2 Sensitive     AMPICILLIN/SULBACTAM 4 Sensitive     PIP/TAZO 8 Sensitive     IMIPENEM <=0.25 Sensitive     CEFAZOLIN <=4 Not Reportable     CEFTRIAXONE <=1 Sensitive     CEFTAZIDIME <=1 Sensitive     CEFEPIME <=1 Sensitive  GENTAMICIN <=1 Sensitive     TOBRAMYCIN <=1 Sensitive     CIPROFLOXACIN <=0.25 Sensitive     LEVOFLOXACIN <=0.12 Sensitive     NITROFURANTOIN 64 Intermediate     TRIMETH/SULFA* <=20 Sensitive      * NR=NOT REPORTABLE,SEE COMMENTORAL therapy:A cefazolin MIC of <32 predicts susceptibility to the oral agents cefaclor,cefdinir,cefpodoxime,cefprozil,cefuroxime,cephalexin,and loracarbef when used for therapy of uncomplicated UTIs due to E.coli,K.pneumomiae,and P.mirabilis. PARENTERAL therapy: A cefazolinMIC of >8 indicates resistance to parenteralcefazolin. An alternate test method must beperformed to confirm susceptibility to parenteralcefazolin.    Coagulation Studies: Recent Labs    12/17/17 2347  LABPROT 12.9  INR 0.98    Imaging: Mr Brain Wo Contrast  Result Date:  12/18/2017 CLINICAL DATA:  Dizziness and expressive aphasia EXAM: MRI HEAD WITHOUT CONTRAST MRA HEAD WITHOUT CONTRAST TECHNIQUE: Multiplanar, multiecho pulse sequences of the brain and surrounding structures were obtained without intravenous contrast. Angiographic images of the head were obtained using MRA technique without contrast. COMPARISON:  CTA head neck 12/17/2017 FINDINGS: MRI HEAD FINDINGS Brain: Incidentally noted is a large development or positive cyst. There is multifocal diffusion restriction involving multiple vascular territories. Largest areas of diffusion abnormality are in the right frontal lobe and left parietal lobe. Smaller areas include the right parietal lobe, right hippocampus and the periphery of the left postcentral gyrus. Diffuse confluent white matter hyperintensity consistent with chronic microvascular ischemia. No mass lesion. Focus of chronic microhemorrhage in the left parietal lobe. No hydrocephalus, age advanced atrophy or lobar predominant volume loss. No dural abnormality or extra-axial collection. Skull and upper cervical spine: The visualized skull base, calvarium, upper cervical spine and extracranial soft tissues are normal. Sinuses/Orbits: No fluid levels or advanced mucosal thickening. No mastoid effusion. Normal orbits. MRA HEAD FINDINGS Intracranial internal carotid arteries: Mild narrowing of the right cavernous segment. Anterior cerebral arteries: Normal. Normal variant hypoplastic right A1 segment. Patent anterior communicating artery. Middle cerebral arteries: Multifocal mild-to-moderate narrowing of both middle cerebral arteries, which is exaggerated by patient motion. Posterior communicating arteries: Absent bilaterally. Posterior cerebral arteries: Multifocal moderate stenosis of the right P2 segment. Moderate-to-severe left P2 segment stenosis. Basilar artery: Indication Vertebral arteries: Left dominant. Normal. Superior cerebellar arteries: Normal. Anterior  inferior cerebellar arteries: Normal. Posterior inferior cerebellar arteries: Below the field of view. IMPRESSION: 1. Multifocal acute to early subacute ischemia within multiple vascular territories, the largest areas of which are in the right frontal and left parietal lobes. Additional of smaller foci of ischemia within the right hippocampus, right parietal lobe and within the left peripheral postcentral gyrus. 2. No hemorrhage or mass effect. 3. Severe chronic microvascular ischemia. 4. Moderate-to-severe stenosis of both posterior cerebral artery P2 segments. 5. Multifocal narrowing of the middle cerebral arteries is exaggerated by patient motion. No proximal occlusion or proximal high-grade stenosis. Electronically Signed   By: Ulyses Jarred M.D.   On: 12/18/2017 16:09   Mr Megan Obrien Head/brain WJ Cm  Result Date: 12/18/2017 CLINICAL DATA:  Dizziness and expressive aphasia EXAM: MRI HEAD WITHOUT CONTRAST MRA HEAD WITHOUT CONTRAST TECHNIQUE: Multiplanar, multiecho pulse sequences of the brain and surrounding structures were obtained without intravenous contrast. Angiographic images of the head were obtained using MRA technique without contrast. COMPARISON:  CTA head neck 12/17/2017 FINDINGS: MRI HEAD FINDINGS Brain: Incidentally noted is a large development or positive cyst. There is multifocal diffusion restriction involving multiple vascular territories. Largest areas of diffusion abnormality are in the right frontal lobe and left parietal lobe. Smaller areas include the right parietal lobe, right hippocampus  and the periphery of the left postcentral gyrus. Diffuse confluent white matter hyperintensity consistent with chronic microvascular ischemia. No mass lesion. Focus of chronic microhemorrhage in the left parietal lobe. No hydrocephalus, age advanced atrophy or lobar predominant volume loss. No dural abnormality or extra-axial collection. Skull and upper cervical spine: The visualized skull base, calvarium,  upper cervical spine and extracranial soft tissues are normal. Sinuses/Orbits: No fluid levels or advanced mucosal thickening. No mastoid effusion. Normal orbits. MRA HEAD FINDINGS Intracranial internal carotid arteries: Mild narrowing of the right cavernous segment. Anterior cerebral arteries: Normal. Normal variant hypoplastic right A1 segment. Patent anterior communicating artery. Middle cerebral arteries: Multifocal mild-to-moderate narrowing of both middle cerebral arteries, which is exaggerated by patient motion. Posterior communicating arteries: Absent bilaterally. Posterior cerebral arteries: Multifocal moderate stenosis of the right P2 segment. Moderate-to-severe left P2 segment stenosis. Basilar artery: Indication Vertebral arteries: Left dominant. Normal. Superior cerebellar arteries: Normal. Anterior inferior cerebellar arteries: Normal. Posterior inferior cerebellar arteries: Below the field of view. IMPRESSION: 1. Multifocal acute to early subacute ischemia within multiple vascular territories, the largest areas of which are in the right frontal and left parietal lobes. Additional of smaller foci of ischemia within the right hippocampus, right parietal lobe and within the left peripheral postcentral gyrus. 2. No hemorrhage or mass effect. 3. Severe chronic microvascular ischemia. 4. Moderate-to-severe stenosis of both posterior cerebral artery P2 segments. 5. Multifocal narrowing of the middle cerebral arteries is exaggerated by patient motion. No proximal occlusion or proximal high-grade stenosis. Electronically Signed   By: Ulyses Jarred M.D.   On: 12/18/2017 16:09    Medications:  I have reviewed the patient's current medications. Scheduled: . aspirin EC  325 mg Oral Daily  . atorvastatin  40 mg Oral Daily  . buPROPion  150 mg Oral BID  . butamben-tetracaine-benzocaine      . carvedilol  12.5 mg Oral BID WC  . fentaNYL      . FLUoxetine  40 mg Oral Daily  . glimepiride  4 mg Oral Q  breakfast  . heparin  5,000 Units Subcutaneous Q8H  . insulin aspart  0-5 Units Subcutaneous QHS  . insulin aspart  0-9 Units Subcutaneous TID WC  . insulin glargine  20 Units Subcutaneous Daily  . lidocaine      . lisinopril  20 mg Oral Daily  . midazolam      . oxybutynin  5 mg Oral TID  . pantoprazole  40 mg Oral Daily  . sodium chloride flush        Assessment/Plan: Patient s/p TEE.  Found to have moderate mitral stenosis and small thrombus in the left atrial appendage.  Afib suspected.  Cardiology has recommended Coumadin.  Despite acute infarct this can be started at this time.  Would not bolus bridge.     LOS: 2 days   Alexis Goodell, MD Neurology [phone] 12/20/2017  11:58 AM

## 2017-12-20 NOTE — Progress Notes (Signed)
Inpatient Diabetes Program Recommendations  AACE/ADA: New Consensus Statement on Inpatient Glycemic Control (2015)  Target Ranges:  Prepandial:   less than 140 mg/dL      Peak postprandial:   less than 180 mg/dL (1-2 hours)      Critically ill patients:  140 - 180 mg/dL   Lab Results  Component Value Date   GLUCAP 189 (H) 12/20/2017   HGBA1C 11.4 (H) 12/18/2017    Review of Glycemic ControlResults for JEANI, FASSNACHT (MRN [id number]) as of 12/20/2017 13:51  Ref. Range 12/19/2017 11:37 12/19/2017 16:47 12/19/2017 21:14 12/20/2017 07:38 12/20/2017 12:00  Glucose-Capillary Latest Ref Range: 65 - 99 mg/dL 240 (H) 269 (H) 157 (H) 199 (H) 189 (H)   Diabetes history: Type 2 DM Outpatient Diabetes medications:  Amaryl 4 mg daily Current orders for Inpatient glycemic control:  Novolog sensitive tid with meals and HS Amaryl 4 mg with breakfast Lantus 25 units daily, Novolog 4 units tid with meals Inpatient Diabetes Program Recommendations:    Showed patient use of insulin pen.  She states that she has done well with syringe dosing of insulin stating "its not that bad".  I demonstrated use of insulin pen including applying needle, 2 unit prime, dialing up dose and how to inject.  Patient returned demo and did fairly well, however she will need reinforcement.  At this point, may do best with insulin once daily and the avoidance of hypoglycemia.  She is still struggling to find her words at times.  I am concerned about her being home by herself.  May consider ordering home health for diabetes management as well to assist with insulin teaching at home.  I have asked case management to check on the cost difference between insulin vial and insulin pen. Patient states that she is interested in using insulin pen if cost is not too high.  Asked RN to review with patient how to draw up insulin as well as continuing education on insulin administration.  Thanks  Adah Perl, RN, BC-ADM Inpatient Diabetes  Coordinator Pager [phone] (8a-5p)

## 2017-12-20 NOTE — Care Management (Signed)
Had anticipated patient to discharge home with home health with [facility]- RN PT OT SLP SW. Patient had TEE which showed small mobile thrombus in the left atria.  Patient was to discharge home on lovenox injections and coumadin.  As CM was beginning to determine copay for the lovenox, was informed that physical therapy has now recommended skilled nursing placement and patient and family agreed.  Attending stated that patient would be medically stable for discharge within the next 24 hours.  Updated CSW

## 2017-12-20 NOTE — Progress Notes (Signed)
PT Cancellation Note  Patient Details Name: Megan Obrien MRN: [id number] DOB: 10/30/1945   Cancelled Treatment:    Reason Eval/Treat Not Completed: Patient at procedure or test/unavailable.  Pt currently off floor and not available for PT session (per notes plan for procedure today).  Will re-attempt PT session at a later date/time as medically appropriate.  Leitha Bleak, PT 12/20/17, 8:37 AM [phone]

## 2017-12-20 NOTE — CV Procedure (Signed)
TEE was done without complications. It showed normal EF, moderate mitral stenosis with dilated left atrium with heavy smoke and small thrombus in left atrial appendage.  I suspect the patient has underlying paroxysmal A-fib in setting of moderate mitral stenosis.  Recommend anticoagulation with Warfarin (NOACs are not recommended in setting of moderate mitral stenosis). We can manage INR in our anticoagulation clinic.   Full TEE report to follow.

## 2017-12-20 NOTE — Plan of Care (Signed)
  Problem: Education: Goal: Knowledge of disease or condition will improve Outcome: Progressing Goal: Knowledge of secondary prevention will improve Outcome: Progressing Goal: Knowledge of patient specific risk factors addressed and post discharge goals established will improve Outcome: Progressing   Problem: Coping: Goal: Will verbalize positive feelings about self Outcome: Progressing Goal: Will identify appropriate support needs Outcome: Progressing   Problem: Health Behavior/Discharge Planning: Goal: Ability to manage health-related needs will improve Outcome: Progressing   Problem: Self-Care: Goal: Ability to participate in self-care as condition permits will improve Outcome: Progressing Goal: Verbalization of feelings and concerns over difficulty with self-care will improve Outcome: Progressing Goal: Ability to communicate needs accurately will improve Outcome: Progressing   Problem: Nutrition: Goal: Risk of aspiration will decrease Outcome: Progressing   Problem: Intracerebral Hemorrhage Tissue Perfusion: Goal: Complications of Intracerebral Hemorrhage will be minimized Outcome: Progressing   Problem: Ischemic Stroke/TIA Tissue Perfusion: Goal: Complications of ischemic stroke/TIA will be minimized Outcome: Progressing   Problem: Spontaneous Subarachnoid Hemorrhage Tissue Perfusion: Goal: Complications of Spontaneous Subarachnoid Hemorrhage will be minimized Outcome: Progressing   Problem: Education: Goal: Knowledge of General Education information will improve Outcome: Progressing   Problem: Health Behavior/Discharge Planning: Goal: Ability to manage health-related needs will improve Outcome: Progressing   Problem: Clinical Measurements: Goal: Ability to maintain clinical measurements within normal limits will improve Outcome: Progressing Goal: Will remain free from infection Outcome: Progressing Goal: Diagnostic test results will improve Outcome:  Progressing Goal: Respiratory complications will improve Outcome: Progressing Goal: Cardiovascular complication will be avoided Outcome: Progressing   Problem: Activity: Goal: Risk for activity intolerance will decrease Outcome: Progressing   Problem: Nutrition: Goal: Adequate nutrition will be maintained Outcome: Progressing   Problem: Coping: Goal: Level of anxiety will decrease Outcome: Progressing   Problem: Elimination: Goal: Will not experience complications related to bowel motility Outcome: Progressing Goal: Will not experience complications related to urinary retention Outcome: Progressing   Problem: Pain Managment: Goal: General experience of comfort will improve Outcome: Progressing   Problem: Safety: Goal: Ability to remain free from injury will improve Outcome: Progressing   Problem: Skin Integrity: Goal: Risk for impaired skin integrity will decrease Outcome: Progressing

## 2017-12-20 NOTE — Interval H&P Note (Signed)
History and Physical Interval Note:  12/20/2017 8:47 AM  Megan Obrien  has presented today for surgery, with the diagnosis of stroke  The various methods of treatment have been discussed with the patient and family. After consideration of risks, benefits and other options for treatment, the patient has consented to  Procedure(s): TRANSESOPHAGEAL ECHOCARDIOGRAM (TEE) (N/A) as a surgical intervention .  The patient's history has been reviewed, patient examined, no change in status, stable for surgery.  I have reviewed the patient's chart and labs.  Questions were answered to the patient's satisfaction.     [name]

## 2017-12-20 NOTE — Progress Notes (Addendum)
ANTICOAGULATION CONSULT NOTE - Initial Consult  Pharmacy Consult for warfarin Indication: atrial fibrillation, small thrombus left atrial appendage  No Known Allergies  Patient Measurements: Height: 5\' 6"  (167.6 cm) Weight: 271 lb 12.8 oz (123.3 kg) IBW/kg (Calculated) : 59.3  Vital Signs: Temp: 98.6 F (37 C) (03/22 0727) Temp Source: Oral (03/22 0727) BP: 206/83 (03/22 0910) Pulse Rate: 71 (03/22 0910)  Labs: Recent Labs    12/17/17 2347 12/18/17 0642 12/19/17 0320 12/20/17 1213  HGB 13.3 12.5  --  12.9  HCT 37.9 36.0  --  37.1  PLT 170 173  --  157  APTT 32  --   --   --   LABPROT 12.9  --   --  13.2  INR 0.98  --   --  1.01  CREATININE 1.60* 1.57* 1.46* 1.48*  TROPONINI <0.03  --   --   --     Estimated Creatinine Clearance: 45.4 mL/min (A) (by C-G formula based on SCr of 1.48 mg/dL (H)).   Medical History: Past Medical History:  Diagnosis Date  . (HFpEF) heart failure with preserved ejection fraction ([place])    a. 03/2017 Echo: EF 55-60%, no rwma, Gr1 DD, mild to mod MS, mod to sev dil LA.  [name] Arthritis   . Carotid arterial disease ([place])    a. 11/2017 CTA Head/Neck: RICA 45, LICA 60.  [name] Cerebrovascular disease    a. 11/2017 MRA: mod to sev stenosis of bilat PCA's P2 segments; b. 11/2017 CTA Head/Neck: RICA 45, LICA 60, sev bilat distal MCA branch stenoses w/ sev bilat P2 stenoses.  . CKD (chronic kidney disease), stage III ([place])   . Depression   . GERD (gastroesophageal reflux disease)   . Hyperlipidemia   . Hypertension   . Left Atrial Appendage Thrombus    a. 11/2017 TEE (performed in setting of stroke/aphasia): nl EF, mod MS, dil LA w/ heavy smoke and small LAA thrombus-->Warfarin initiated.  . Mitral stenosis    a. Mild to moderate by echo 03/2017; b. 03/2017 R/LHC: PCWP 81mmHg, LVEDP 75mmHg. Mean grad of 75mmHg. Valve area of 2.02cm^2; c. 11/2017 TEE: Mod MS.  . Morbid obesity ([place])   . Murmur   . Non-obstructive CAD (coronary artery disease)    a. 03/2017  Cath: mild, non-obstructive CAD.  [name] OSA (obstructive sleep apnea)    a. Previously wore CPAP for several years but stopped on her own and now just uses O2 via [place] @ night.  [name] PAH (pulmonary artery hypertension) ([place])    a. 03/2017 RHC: PA 8mmHg.  . Seasonal allergies   . Stroke [facility])    a. 11/2017 - presented w/ aphasia->MRI acute to early subacute ischemia w/in multiple vascular territories, largest in R frontal & l parietal lobes. Smaller foci of ischemia w/in R hippocampus, R parietal love, and L peripheral postcentral gyrus.  . Type 2 diabetes mellitus ([place])   . Urinary incontinence     Assessment: Pharmacy consulted to dose and monitor warfarin in this 72 year old female. This will be a new start for atrial fibrillation and a newly diagnosed small thrombus in left atrial appendage.   Goal of Therapy:  INR 2-3 Monitor platelets by anticoagulation protocol: Yes   Plan:  Discontinued heparin 5000 units TID for DVT prophylaxis Initiate warfarin 5 mg PO daily and follow INR daily. Patient is not on any medications that significantly interact with warfarin Enoxaparin 1 mg/kg subcutaneously q12h bridge therapy per discussion with MD  CBC and  SCr to be checked at least every 72 hours per protocol.  Lenis Noon, PharmD, BCPS Clinical Pharmacist 12/20/2017,12:56 PM   Addendum: Patient provided handout and counseling on warfarin.  Lenis Noon, PharmD 12/20/17 3:34 PM

## 2017-12-20 NOTE — Plan of Care (Signed)
Problem: Education: Goal: Knowledge of disease or condition will improve 12/20/2017 1908 by Rowe Robert, RN Outcome: Progressing 12/20/2017 1106 by Rowe Robert, RN Outcome: Progressing Goal: Knowledge of secondary prevention will improve 12/20/2017 1908 by Rowe Robert, RN Outcome: Progressing 12/20/2017 1106 by Rowe Robert, RN Outcome: Progressing Goal: Knowledge of patient specific risk factors addressed and post discharge goals established will improve 12/20/2017 1908 by Rowe Robert, RN Outcome: Progressing 12/20/2017 1106 by Rowe Robert, RN Outcome: Progressing   Problem: Coping: Goal: Will verbalize positive feelings about self 12/20/2017 1908 by Rowe Robert, RN Outcome: Progressing 12/20/2017 1106 by Rowe Robert, RN Outcome: Progressing Goal: Will identify appropriate support needs 12/20/2017 1908 by Rowe Robert, RN Outcome: Progressing 12/20/2017 1106 by Rowe Robert, RN Outcome: Progressing   Problem: Health Behavior/Discharge Planning: Goal: Ability to manage health-related needs will improve 12/20/2017 1908 by Rowe Robert, RN Outcome: Progressing 12/20/2017 1106 by Rowe Robert, RN Outcome: Progressing   Problem: Self-Care: Goal: Ability to participate in self-care as condition permits will improve 12/20/2017 1908 by Rowe Robert, RN Outcome: Progressing 12/20/2017 1106 by Rowe Robert, RN Outcome: Progressing Goal: Verbalization of feelings and concerns over difficulty with self-care will improve 12/20/2017 1908 by Rowe Robert, RN Outcome: Progressing 12/20/2017 1106 by Rowe Robert, RN Outcome: Progressing Goal: Ability to communicate needs accurately will improve 12/20/2017 1908 by Rowe Robert, RN Outcome: Progressing 12/20/2017 1106 by Rowe Robert, RN Outcome: Progressing   Problem: Nutrition: Goal: Risk of aspiration will decrease 12/20/2017 1908 by Rowe Robert, RN Outcome: Progressing 12/20/2017 1106 by Rowe Robert, RN Outcome: Progressing   Problem: Intracerebral Hemorrhage Tissue Perfusion: Goal: Complications of Intracerebral Hemorrhage will be minimized 12/20/2017 1908 by Rowe Robert, RN Outcome: Progressing 12/20/2017 1106 by Rowe Robert, RN Outcome: Progressing   Problem: Ischemic Stroke/TIA Tissue Perfusion: Goal: Complications of ischemic stroke/TIA will be minimized 12/20/2017 1908 by Rowe Robert, RN Outcome: Progressing 12/20/2017 1106 by Rowe Robert, RN Outcome: Progressing   Problem: Spontaneous Subarachnoid Hemorrhage Tissue Perfusion: Goal: Complications of Spontaneous Subarachnoid Hemorrhage will be minimized 12/20/2017 1908 by Rowe Robert, RN Outcome: Progressing 12/20/2017 1106 by Rowe Robert, RN Outcome: Progressing   Problem: Education: Goal: Knowledge of General Education information will improve 12/20/2017 1908 by Rowe Robert, RN Outcome: Progressing 12/20/2017 1106 by Rowe Robert, RN Outcome: Progressing   Problem: Health Behavior/Discharge Planning: Goal: Ability to manage health-related needs will improve 12/20/2017 1908 by Rowe Robert, RN Outcome: Progressing 12/20/2017 1106 by Rowe Robert, RN Outcome: Progressing   Problem: Clinical Measurements: Goal: Ability to maintain clinical measurements within normal limits will improve 12/20/2017 1908 by Rowe Robert, RN Outcome: Progressing 12/20/2017 1106 by Rowe Robert, RN Outcome: Progressing Goal: Will remain free from infection 12/20/2017 1908 by Rowe Robert, RN Outcome: Progressing 12/20/2017 1106 by Rowe Robert, RN Outcome: Progressing Goal: Diagnostic test results will improve 12/20/2017 1908 by Rowe Robert, RN Outcome: Progressing 12/20/2017 1106 by Rowe Robert, RN Outcome: Progressing Goal: Respiratory complications will improve 12/20/2017 1908 by Rowe Robert, RN Outcome: Progressing 12/20/2017 1106 by Rowe Robert, RN Outcome: Progressing Goal:  Cardiovascular complication will be avoided 12/20/2017 1908 by Rowe Robert, RN Outcome: Progressing 12/20/2017 1106 by Rowe Robert, RN Outcome: Progressing   Problem: Activity: Goal: Risk for activity intolerance will decrease 12/20/2017 1908 by Rowe Robert, RN Outcome: Progressing 12/20/2017 1106 by Rowe Robert, RN Outcome: Progressing   Problem: Nutrition: Goal: Adequate nutrition will be maintained 12/20/2017 1908 by Rowe Robert, RN Outcome: Progressing 12/20/2017 1106 by Rowe Robert, RN Outcome: Progressing   Problem: Coping: Goal:  Level of anxiety will decrease 12/20/2017 1908 by Rowe Robert, RN Outcome: Progressing 12/20/2017 1106 by Rowe Robert, RN Outcome: Progressing   Problem: Elimination: Goal: Will not experience complications related to bowel motility 12/20/2017 1908 by Rowe Robert, RN Outcome: Progressing 12/20/2017 1106 by Rowe Robert, RN Outcome: Progressing Goal: Will not experience complications related to urinary retention 12/20/2017 1908 by Rowe Robert, RN Outcome: Progressing 12/20/2017 1106 by Rowe Robert, RN Outcome: Progressing   Problem: Pain Managment: Goal: General experience of comfort will improve 12/20/2017 1908 by Rowe Robert, RN Outcome: Progressing 12/20/2017 1106 by Rowe Robert, RN Outcome: Progressing   Problem: Safety: Goal: Ability to remain free from injury will improve 12/20/2017 1908 by Rowe Robert, RN Outcome: Progressing 12/20/2017 1106 by Rowe Robert, RN Outcome: Progressing   Problem: Skin Integrity: Goal: Risk for impaired skin integrity will decrease 12/20/2017 1908 by Rowe Robert, RN Outcome: Progressing 12/20/2017 1106 by Rowe Robert, RN Outcome: Progressing

## 2017-12-20 NOTE — Progress Notes (Signed)
[place] [facility] [place] NAME: Megan Obrien    MR#:  [id number]  DATE OF BIRTH:  1946-06-02  SUBJECTIVE:  CHIEF COMPLAINT:   Chief Complaint  Patient presents with  . Dizziness   Came with speech difficulty, still have the symptoms. Denies headache or any other symptoms.  Noted to have Intra atrial thrombus, Afib.  REVIEW OF SYSTEMS:  CONSTITUTIONAL: No fever, fatigue or weakness.  EYES: No blurred or double vision.  EARS, NOSE, AND THROAT: No tinnitus or ear pain.  RESPIRATORY: No cough, shortness of breath, wheezing or hemoptysis.  CARDIOVASCULAR: No chest pain, orthopnea, edema.  GASTROINTESTINAL: No nausea, vomiting, diarrhea or abdominal pain.  GENITOURINARY: No dysuria, hematuria.  ENDOCRINE: No polyuria, nocturia,  HEMATOLOGY: No anemia, easy bruising or bleeding SKIN: No rash or lesion. MUSCULOSKELETAL: No joint pain or arthritis.   NEUROLOGIC: No tingling, numbness, weakness.  PSYCHIATRY: No anxiety or depression.   ROS  DRUG ALLERGIES:  No Known Allergies  VITALS:  Blood pressure (!) 206/83, pulse 71, temperature 98.6 F (37 C), temperature source Oral, resp. rate (!) 21, height 5\' 6"  (1.676 m), weight 123.3 kg (271 lb 12.8 oz), SpO2 100 %.  PHYSICAL EXAMINATION:  GENERAL:  72 y.o.-year-old patient lying in the bed with no acute distress.  EYES: Pupils equal, round, reactive to light and accommodation. No scleral icterus. Extraocular muscles intact.  HEENT: Head atraumatic, normocephalic. Oropharynx and nasopharynx clear.  NECK:  Supple, no jugular venous distention. No thyroid enlargement, no tenderness.  LUNGS: Normal breath sounds bilaterally, no wheezing, rales,rhonchi or crepitation. No use of accessory muscles of respiration.  CARDIOVASCULAR: S1, S2 normal. No murmurs, rubs, or gallops.  ABDOMEN: Soft, nontender, nondistended. Bowel sounds present. No organomegaly or mass.  EXTREMITIES: No pedal edema, cyanosis,  or clubbing.  NEUROLOGIC: Cranial nerves II through XII are intact. Muscle strength 4/5 in all extremities. Sensation intact. Gait not checked.  PSYCHIATRIC: The patient is alert and oriented x 3.  SKIN: No obvious rash, lesion, or ulcer.   Physical Exam LABORATORY PANEL:   CBC Recent Labs  Lab 12/20/17 1213  WBC 5.0  HGB 12.9  HCT 37.1  PLT 157   ------------------------------------------------------------------------------------------------------------------  Chemistries  Recent Labs  Lab 12/17/17 2347  12/20/17 1213  NA 135   < > 137  K 4.1   < > 3.9  CL 98*   < > 100*  CO2 29   < > 27  GLUCOSE 404*   < > 197*  BUN 31*   < > 30*  CREATININE 1.60*   < > 1.48*  CALCIUM 9.2   < > 8.7*  AST 20  --   --   ALT 13*  --   --   ALKPHOS 75  --   --   BILITOT 0.6  --   --    < > = values in this interval not displayed.   ------------------------------------------------------------------------------------------------------------------  Cardiac Enzymes Recent Labs  Lab 12/17/17 2347  TROPONINI <0.03   ------------------------------------------------------------------------------------------------------------------  RADIOLOGY:  No results found.  ASSESSMENT AND PLAN:   Principal Problem:   Expressive aphasia Active Problems:   Hyperlipidemia   Essential hypertension   Type 2 diabetes mellitus with complication, without long-term current use of insulin (HCC)   Depression   GERD (gastroesophageal reflux disease)   Stroke [facility])    * Expressive aphasia -patient is still experiencing this      Acute stroke   reviewed Echo report. Severe  mitral stenosis and calcifications, LVH pattern.   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   HBA1c is high- advised and started on lantus   LDL is high- started on statin.   SLP eval and  therapy after discharge    * A fib, INtracardiac thrombus   Start on coumadin and Lovenox.  * DM   Hold oral meds, start on lantus  * Hyperlipidemia   Start atorvastatin.  * hypertension   Have multiple intracranial stenosis   She will benefit from slightly higher blood pressure,    On Coreg, lisinopril, added amlodipine.      All the records are reviewed and case discussed with Care Management/Social Workerr. Management plans discussed with the patient, family and they are in agreement.  CODE STATUS: Full.  TOTAL TIME TAKING CARE OF THIS PATIENT: 80 minutes.  Spoke to her daughter, son, grand son all separately and with case manager about the plan. Initially explained about the thrombus and anticoagulants. Plans for d/c home and arrangements with son and grand son. Pt had a loss of balance and a fall today so called PT eval again and discussed with Case manager PT had suggested to have rehab, so pt's daughter wanted to discuss about that option, I had discuss that with them and with Education officer, museum. Later again family members have decised to take her home and arranging support for her, and called me again , I went again and discussed with her son, and daughter about required arrangements and informed , today it will be late as we need to arrange for her lovenox Mar-Mac injections, so she can be discharge tomorrow.  POSSIBLE D/C IN 1-2 DAYS, DEPENDING ON CLINICAL CONDITION.   Vaughan Basta M.D on 12/20/2017   Between 7am to 6pm - Pager - [phone]  After 6pm go to [email or web address] - password [email or web address] [place]  Office  [phone]  CC: Primary care physician; Pleas Koch, NP  Note: This dictation was prepared with Dragon dictation along with smaller phrase technology. Any transcriptional errors that result from this process are unintentional.

## 2017-12-20 NOTE — Care Management Important Message (Signed)
Important Message  Patient Details  Name: Megan Obrien MRN: [id number] Date of Birth: May 26, 1946   Medicare Important Message Given:  Yes Signed IM notice given    Katrina Stack, RN 12/20/2017, 10:21 AM

## 2017-12-20 NOTE — Care Management Important Message (Signed)
Important Message  Patient Details  Name: SILVINA HACKLEMAN MRN: [id number] Date of Birth: [birth year]   Medicare Important Message Given:  Yes Signed IM notice given    Katrina Stack, RN 12/20/2017, 9:03 AM

## 2017-12-21 LAB — GLUCOSE, CAPILLARY
GLUCOSE-CAPILLARY: 146 mg/dL — AB (ref 65–99)
GLUCOSE-CAPILLARY: 216 mg/dL — AB (ref 65–99)
Glucose-Capillary: 186 mg/dL — ABNORMAL HIGH (ref 65–99)
Glucose-Capillary: 159 mg/dL — ABNORMAL HIGH (ref 65–99)

## 2017-12-21 LAB — PROTIME-INR
INR: 1.1
PROTHROMBIN TIME: 14.1 s (ref 11.4–15.2)

## 2017-12-21 LAB — CBC
HCT: 36.7 % (ref 35.0–47.0)
Hemoglobin: 12.5 g/dL (ref 12.0–16.0)
MCH: 30.9 pg (ref 26.0–34.0)
MCHC: 34.2 g/dL (ref 32.0–36.0)
MCV: 90.5 fL (ref 80.0–100.0)
PLATELETS: 162 10*3/uL (ref 150–440)
RBC: 4.05 MIL/uL (ref 3.80–5.20)
RDW: 13 % (ref 11.5–14.5)
WBC: 6 10*3/uL (ref 3.6–11.0)

## 2017-12-21 MED ORDER — WARFARIN SODIUM 5 MG PO TABS
5.0000 mg | ORAL_TABLET | Freq: Every day | ORAL | Status: DC
  Administered 2017-12-21: 18:00:00 5 mg via ORAL
  Filled 2017-12-21: qty 1

## 2017-12-21 NOTE — Progress Notes (Signed)
ANTICOAGULATION CONSULT NOTE - Follow Up Consult  Pharmacy Consult for warfarin Indication: atrial fibrillation, small thrombus left atrial appendage  No Known Allergies  Patient Measurements: Height: 5\' 6"  (167.6 cm) Weight: 271 lb 12.8 oz (123.3 kg) IBW/kg (Calculated) : 59.3  Vital Signs: Temp: 97.4 F (36.3 C) (03/23 0437) Temp Source: Oral (03/23 0437) BP: 158/57 (03/23 0437) Pulse Rate: 62 (03/23 0437)  Labs: Recent Labs    12/19/17 0320 12/20/17 1213 12/21/17 0540  HGB  --  12.9 12.5  HCT  --  37.1 36.7  PLT  --  157 162  LABPROT  --  13.2 14.1  INR  --  1.01 1.10  CREATININE 1.46* 1.48*  --     Estimated Creatinine Clearance: 45.4 mL/min (A) (by C-G formula based on SCr of 1.48 mg/dL (H)).   Medical History: Past Medical History:  Diagnosis Date  . (HFpEF) heart failure with preserved ejection fraction ([place])    a. 03/2017 Echo: EF 55-60%, no rwma, Gr1 DD, mild to mod MS, mod to sev dil LA.  [name] Arthritis   . Carotid arterial disease ([place])    a. 11/2017 CTA Head/Neck: RICA 45, LICA 60.  [name] Cerebrovascular disease    a. 11/2017 MRA: mod to sev stenosis of bilat PCA's P2 segments; b. 11/2017 CTA Head/Neck: RICA 45, LICA 60, sev bilat distal MCA branch stenoses w/ sev bilat P2 stenoses.  . CKD (chronic kidney disease), stage III ([place])   . Depression   . GERD (gastroesophageal reflux disease)   . Hyperlipidemia   . Hypertension   . Left Atrial Appendage Thrombus    a. 11/2017 TEE (performed in setting of stroke/aphasia): nl EF, mod MS, dil LA w/ heavy smoke and small LAA thrombus-->Warfarin initiated.  . Mitral stenosis    a. Mild to moderate by echo 03/2017; b. 03/2017 R/LHC: PCWP 19mmHg, LVEDP 18mmHg. Mean grad of 42mmHg. Valve area of 2.02cm^2; c. 11/2017 TEE: Mod MS.  . Morbid obesity ([place])   . Murmur   . Non-obstructive CAD (coronary artery disease)    a. 03/2017 Cath: mild, non-obstructive CAD.  [name] OSA (obstructive sleep apnea)    a. Previously wore CPAP for  several years but stopped on her own and now just uses O2 via [place] @ night.  [name] PAH (pulmonary artery hypertension) ([place])    a. 03/2017 RHC: PA 31mmHg.  . Seasonal allergies   . Stroke [facility])    a. 11/2017 - presented w/ aphasia->MRI acute to early subacute ischemia w/in multiple vascular territories, largest in R frontal & L parietal lobes. Smaller foci of ischemia w/in R hippocampus, R parietal lobe, and L peripheral postcentral gyrus.  . Type 2 diabetes mellitus ([place])   . Urinary incontinence     Assessment: Pharmacy consulted to dose and monitor warfarin in this 72 year old female. This will be a new start for atrial fibrillation and a newly diagnosed small thrombus in left atrial appendage.   Goal of Therapy:  INR 2-3 Monitor platelets by anticoagulation protocol: Yes   Plan:  Discontinued heparin 5000 units TID for DVT prophylaxis Initiate warfarin 5 mg PO daily and follow INR daily. Patient is not on any medications that significantly interact with warfarin Enoxaparin 1 mg/kg subcutaneously q12h bridge therapy per discussion with MD.  Patient provided handout and counseling on warfarin.  3/23  INR = 1.10.    Continue with Warfarin 5mg  PO daily. CBC and SCr to be checked at least every 72 hours per protocol.  Olivia Canter, [facility] 12/21/17 7:52 AM

## 2017-12-21 NOTE — Progress Notes (Signed)
[place] [facility] [place] NAME: Megan Obrien    MR#:  [id number]  DATE OF BIRTH:  02-02-1946  SUBJECTIVE:  CHIEF COMPLAINT:   Chief Complaint  Patient presents with  . Dizziness   Came with speech difficulty, symptoms improved   Noted to have Intra atrial thrombus, Afib.  Worked with therapy earlier.  Sitting in a chair.  Afebrile.  No pain.  No chest pain or shortness of breath.  REVIEW OF SYSTEMS:  CONSTITUTIONAL: No fever, fatigue or weakness.  EYES: No blurred or double vision.  EARS, NOSE, AND THROAT: No tinnitus or ear pain.  RESPIRATORY: No cough, shortness of breath, wheezing or hemoptysis.  CARDIOVASCULAR: No chest pain, orthopnea, edema.  GASTROINTESTINAL: No nausea, vomiting, diarrhea or abdominal pain.  GENITOURINARY: No dysuria, hematuria.  ENDOCRINE: No polyuria, nocturia,  HEMATOLOGY: No anemia, easy bruising or bleeding SKIN: No rash or lesion. MUSCULOSKELETAL: No joint pain or arthritis.   NEUROLOGIC: No tingling, numbness, weakness.  PSYCHIATRY: No anxiety or depression.   ROS  DRUG ALLERGIES:  No Known Allergies  VITALS:  Blood pressure (!) 157/71, pulse (!) 57, temperature (!) 97.4 F (36.3 C), temperature source Oral, resp. rate 16, height 5\' 6"  (1.676 m), weight 123.3 kg (271 lb 12.8 oz), SpO2 98 %.  PHYSICAL EXAMINATION:  GENERAL:  72 y.o.-year-old patient lying in the bed with no acute distress.  Obese EYES: Pupils equal, round, reactive to light and accommodation. No scleral icterus. Extraocular muscles intact.  HEENT: Head atraumatic, normocephalic. Oropharynx and nasopharynx clear.  NECK:  Supple, no jugular venous distention. No thyroid enlargement, no tenderness.  LUNGS: Normal breath sounds bilaterally, no wheezing, rales,rhonchi or crepitation. No use of accessory muscles of respiration.  CARDIOVASCULAR: S1, S2 normal. No murmurs, rubs, or gallops.  ABDOMEN: Soft, nontender, nondistended. Bowel  sounds present. No organomegaly or mass.  EXTREMITIES: No pedal edema, cyanosis, or clubbing.  NEUROLOGIC: Cranial nerves II through XII are intact. Muscle strength 4/5 in all extremities. Sensation intact. Gait not checked.  PSYCHIATRIC: The patient is alert and oriented x 3.  SKIN: No obvious rash, lesion, or ulcer.   Physical Exam LABORATORY PANEL:   CBC Recent Labs  Lab 12/21/17 0540  WBC 6.0  HGB 12.5  HCT 36.7  PLT 162   ------------------------------------------------------------------------------------------------------------------  Chemistries  Recent Labs  Lab 12/17/17 2347  12/20/17 1213  NA 135   < > 137  K 4.1   < > 3.9  CL 98*   < > 100*  CO2 29   < > 27  GLUCOSE 404*   < > 197*  BUN 31*   < > 30*  CREATININE 1.60*   < > 1.48*  CALCIUM 9.2   < > 8.7*  AST 20  --   --   ALT 13*  --   --   ALKPHOS 75  --   --   BILITOT 0.6  --   --    < > = values in this interval not displayed.   ------------------------------------------------------------------------------------------------------------------  Cardiac Enzymes Recent Labs  Lab 12/17/17 2347  TROPONINI <0.03   ------------------------------------------------------------------------------------------------------------------  RADIOLOGY:  No results found.  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 Severe mitral stenosis and calcifications, LVH pattern.   CTA head and neck done-no stenosis on large arteries   LDL is high- started on statin. TEE showed atrial thrombus.  Started on Coumadin.    * A fib, left atrial thrombus   Started on coumadin and Lovenox.  * DM   Hold  oral med On Lantus  * Hyperlipidemia   Started atorvastatin.  * Hypertension, uncontrolled   Have multiple intracranial stenosis   She will benefit from slightly higher blood pressure,    On Coreg, lisinopril/ Norvasc added on 22nd.  Will monitor for another 24 hours.    All the records are reviewed and case discussed with Care Management/Social Workerr. Management plans discussed with the patient, family and they are in agreement.  CODE STATUS: Full.  TOTAL TIME TAKING CARE OF THIS PATIENT: 30 minutes.   POSSIBLE D/C IN 1-2 DAYS, DEPENDING ON CLINICAL CONDITION.   Neita Carp M.D on 12/21/2017   Between 7am to 6pm - Pager - [phone]  After 6pm go to [email or web address] - password [email or web address] [facility]  Office  [phone]  CC: Primary care physician; Pleas Koch, NP  Note: This dictation was prepared with Dragon dictation along with smaller phrase technology. Any transcriptional errors that result from this process are unintentional.

## 2017-12-21 NOTE — Progress Notes (Signed)
Physical Therapy Treatment Patient Details Name: Megan Obrien MRN: [id number] DOB: 10-31-1945 Today's Date: 12/21/2017    History of Present Illness 72 y.o. female who presents with dizziness and expressive aphasia. Admitted 12/17/17 for stroke work up.  PMH includes heart failure, CKD, htn, OSA, PAH, DM, back and knee surgery.    PT Comments    Pt to edge of bed with rail and ease.  Able to return to supine and back up without assist but uses rail.  Attempted to stand x 2 with +1 assist.  She was unable to safely stand and a 2nd assist was called.  +2 min assist to stand.  She was able to ambulate 35' x 2 with stnd walker and min guard +1-+2.  Overall mobility and transfers did improve during session but she remains at high fall risk.  Discussed discharge plan.  Pt agreeable to SNF "If I don't have to stay forever".  While pt remains hopeful to return directly home, she reluctantly agrees that she would have a difficulty time caring for herself at this point.   Follow Up Recommendations  SNF     Equipment Recommendations       Recommendations for Other Services       Precautions / Restrictions Precautions Precautions: Fall Restrictions Weight Bearing Restrictions: No    Mobility  Bed Mobility Overal bed mobility: Modified Independent Bed Mobility: Supine to Sit;Sit to Supine     Supine to sit: Supervision Sit to supine: Supervision   General bed mobility comments: bed flat; increased effort and time to perform on own  Transfers Overall transfer level: Needs assistance Equipment used: Standard walker Transfers: Sit to/from Stand Sit to Stand: Min assist;+2 physical assistance         General transfer comment: attempted stand x 2 with 1 assit.  unable to safely stand.  +2 assist to stand from bed.  On further attempts she was able to stand with +1 assist.  Ambulation/Gait Ambulation/Gait assistance: Min guard;+2 safety/equipment Ambulation Distance (Feet): 35  Feet Assistive device: Standard walker Gait Pattern/deviations: Step-to pattern         Stairs            Wheelchair Mobility    Modified Rankin (Stroke Patients Only)       Balance Overall balance assessment: Needs assistance;History of Falls Sitting-balance support: No upper extremity supported;Feet supported Sitting balance-Leahy Scale: Good Sitting balance - Comments: steady sitting reaching within BOS   Standing balance support: Bilateral upper extremity supported Standing balance-Leahy Scale: Poor Standing balance comment: requires B UE support for ambulation                            Cognition Arousal/Alertness: Awake/alert Behavior During Therapy: WFL for tasks assessed/performed Overall Cognitive Status: Within Functional Limits for tasks assessed                                        Exercises Other Exercises Other Exercises: Pt to bathroom to void.  Required assist for self care.    General Comments        Pertinent Vitals/Pain Pain Assessment: No/denies pain    Home Living                      Prior Function  PT Goals (current goals can now be found in the care plan section) Progress towards PT goals: Progressing toward goals    Frequency    7X/week      PT Plan Current plan remains appropriate    Co-evaluation              AM-PAC PT "6 Clicks" Daily Activity  Outcome Measure  Difficulty turning over in bed (including adjusting bedclothes, sheets and blankets)?: A Little Difficulty moving from lying on back to sitting on the side of the bed? : A Little Difficulty sitting down on and standing up from a chair with arms (e.g., wheelchair, bedside commode, etc,.)?: Unable Help needed moving to and from a bed to chair (including a wheelchair)?: A Little Help needed walking in hospital room?: A Little Help needed climbing 3-5 steps with a railing? : Total 6 Click Score: 14     End of Session Equipment Utilized During Treatment: Gait belt Activity Tolerance: Patient limited by fatigue Patient left: in chair;with chair alarm set;with call bell/phone within reach Nurse Communication: Mobility status;Precautions       Time: 1140-1155 PT Time Calculation (min) (ACUTE ONLY): 15 min  Charges:  $Gait Training: 8-22 mins                    G Codes:       Chesley Noon, PTA 12/21/17, 12:21 PM

## 2017-12-21 NOTE — Progress Notes (Signed)
Took over pt care at 1500, pt with no complaints

## 2017-12-22 LAB — PROTIME-INR
INR: 1.14
Prothrombin Time: 14.5 seconds (ref 11.4–15.2)

## 2017-12-22 LAB — GLUCOSE, CAPILLARY
GLUCOSE-CAPILLARY: 155 mg/dL — AB (ref 65–99)
GLUCOSE-CAPILLARY: 140 mg/dL — AB (ref 65–99)
GLUCOSE-CAPILLARY: 111 mg/dL — AB (ref 65–99)
Glucose-Capillary: 128 mg/dL — ABNORMAL HIGH (ref 65–99)

## 2017-12-22 MED ORDER — WARFARIN SODIUM 10 MG PO TABS
10.0000 mg | ORAL_TABLET | Freq: Once | ORAL | Status: AC
  Administered 2017-12-22: 10 mg via ORAL
  Filled 2017-12-22: qty 1

## 2017-12-22 NOTE — Plan of Care (Signed)
  Problem: Education: Goal: Knowledge of disease or condition will improve Outcome: Progressing Goal: Knowledge of secondary prevention will improve Outcome: Progressing Goal: Knowledge of patient specific risk factors addressed and post discharge goals established will improve Outcome: Progressing   Problem: Coping: Goal: Will verbalize positive feelings about self Outcome: Progressing Goal: Will identify appropriate support needs Outcome: Progressing   Problem: Health Behavior/Discharge Planning: Goal: Ability to manage health-related needs will improve Outcome: Progressing   Problem: Self-Care: Goal: Ability to participate in self-care as condition permits will improve Outcome: Progressing Goal: Verbalization of feelings and concerns over difficulty with self-care will improve Outcome: Progressing Goal: Ability to communicate needs accurately will improve Outcome: Progressing   Problem: Nutrition: Goal: Risk of aspiration will decrease Outcome: Progressing   Problem: Intracerebral Hemorrhage Tissue Perfusion: Goal: Complications of Intracerebral Hemorrhage will be minimized Outcome: Progressing   Problem: Ischemic Stroke/TIA Tissue Perfusion: Goal: Complications of ischemic stroke/TIA will be minimized Outcome: Progressing   Problem: Spontaneous Subarachnoid Hemorrhage Tissue Perfusion: Goal: Complications of Spontaneous Subarachnoid Hemorrhage will be minimized Outcome: Progressing   Problem: Education: Goal: Knowledge of General Education information will improve Outcome: Progressing   Problem: Health Behavior/Discharge Planning: Goal: Ability to manage health-related needs will improve Outcome: Progressing   Problem: Clinical Measurements: Goal: Ability to maintain clinical measurements within normal limits will improve Outcome: Progressing Goal: Will remain free from infection Outcome: Progressing Goal: Diagnostic test results will improve Outcome:  Progressing Goal: Respiratory complications will improve Outcome: Progressing Goal: Cardiovascular complication will be avoided Outcome: Progressing   Problem: Activity: Goal: Risk for activity intolerance will decrease Outcome: Progressing   Problem: Nutrition: Goal: Adequate nutrition will be maintained Outcome: Progressing   Problem: Coping: Goal: Level of anxiety will decrease Outcome: Progressing   Problem: Elimination: Goal: Will not experience complications related to bowel motility Outcome: Progressing Goal: Will not experience complications related to urinary retention Outcome: Progressing   Problem: Pain Managment: Goal: General experience of comfort will improve Outcome: Progressing   Problem: Safety: Goal: Ability to remain free from injury will improve Outcome: Progressing   Problem: Skin Integrity: Goal: Risk for impaired skin integrity will decrease Outcome: Progressing

## 2017-12-22 NOTE — Progress Notes (Signed)
[place] [facility] [place] NAME: Megan Obrien    MR#:  [id number]  DATE OF BIRTH:  10-27-1945  SUBJECTIVE:  CHIEF COMPLAINT:   Chief Complaint  Patient presents with  . Dizziness   Came with speech difficulty, symptoms improved   Noted to have Intra atrial thrombus, Afib.  Sitting in her bed.  No concerns.  Waiting for skilled nursing facility.  Afebrile.  REVIEW OF SYSTEMS:  CONSTITUTIONAL: No fever, fatigue or weakness.  EYES: No blurred or double vision.  EARS, NOSE, AND THROAT: No tinnitus or ear pain.  RESPIRATORY: No cough, shortness of breath, wheezing or hemoptysis.  CARDIOVASCULAR: No chest pain, orthopnea, edema.  GASTROINTESTINAL: No nausea, vomiting, diarrhea or abdominal pain.  GENITOURINARY: No dysuria, hematuria.  ENDOCRINE: No polyuria, nocturia,  HEMATOLOGY: No anemia, easy bruising or bleeding SKIN: No rash or lesion. MUSCULOSKELETAL: No joint pain or arthritis.   NEUROLOGIC: No tingling, numbness, weakness.  PSYCHIATRY: No anxiety or depression.   ROS  DRUG ALLERGIES:  No Known Allergies  VITALS:  Blood pressure 140/60, pulse 60, temperature 97.7 F (36.5 C), temperature source Oral, resp. rate 18, height 5\' 6"  (1.676 m), weight 123.3 kg (271 lb 12.8 oz), SpO2 98 %.  PHYSICAL EXAMINATION:  GENERAL:  72 y.o.-year-old patient lying in the bed with no acute distress.  Obese EYES: Pupils equal, round, reactive to light and accommodation. No scleral icterus. Extraocular muscles intact.  HEENT: Head atraumatic, normocephalic. Oropharynx and nasopharynx clear.  NECK:  Supple, no jugular venous distention. No thyroid enlargement, no tenderness.  LUNGS: Normal breath sounds bilaterally, no wheezing, rales,rhonchi or crepitation. No use of accessory muscles of respiration.  CARDIOVASCULAR: S1, S2 normal. No murmurs, rubs, or gallops.  ABDOMEN: Soft, nontender, nondistended. Bowel sounds present. No organomegaly or mass.   EXTREMITIES: No pedal edema, cyanosis, or clubbing.  NEUROLOGIC: Cranial nerves II through XII are intact. Muscle strength 4/5 in all extremities. Sensation intact. Gait not checked.  PSYCHIATRIC: The patient is alert and oriented x 3.  SKIN: No obvious rash, lesion, or ulcer.   Physical Exam LABORATORY PANEL:   CBC Recent Labs  Lab 12/21/17 0540  WBC 6.0  HGB 12.5  HCT 36.7  PLT 162   ------------------------------------------------------------------------------------------------------------------  Chemistries  Recent Labs  Lab 12/17/17 2347  12/20/17 1213  NA 135   < > 137  K 4.1   < > 3.9  CL 98*   < > 100*  CO2 29   < > 27  GLUCOSE 404*   < > 197*  BUN 31*   < > 30*  CREATININE 1.60*   < > 1.48*  CALCIUM 9.2   < > 8.7*  AST 20  --   --   ALT 13*  --   --   ALKPHOS 75  --   --   BILITOT 0.6  --   --    < > = values in this interval not displayed.   ------------------------------------------------------------------------------------------------------------------  Cardiac Enzymes Recent Labs  Lab 12/17/17 2347  TROPONINI <0.03   ------------------------------------------------------------------------------------------------------------------  RADIOLOGY:  No results found.  ASSESSMENT AND PLAN:   Principal Problem:   Expressive aphasia Active Problems:   Hyperlipidemia   Essential hypertension   Type 2 diabetes mellitus with complication, without long-term current use of insulin (HCC)   Depression   GERD (gastroesophageal reflux disease)   Stroke (HCC)    * Expressive aphasia -acute bilateral embolic CVA   reviewed Echo report. Severe  mitral stenosis and calcifications, LVH pattern.   CTA head and neck done-no stenosis on large arteries   LDL is high- started on statin. TEE showed atrial thrombus.  Started on Coumadin with Lovenox bridging    * A fib, left atrial thrombus   Started on coumadin and Lovenox.  * DM Oral medications held in the  hospital On Lantus  * Hyperlipidemia   Started atorvastatin.  * Hypertension, uncontrolled   On Coreg, lisinopril/ Norvasc added on 22nd.   Blood pressure is improved today    All the records are reviewed and case discussed with Care Management/Social Workerr. Management plans discussed with the patient, family and they are in agreement.  CODE STATUS: Full.  TOTAL TIME TAKING CARE OF THIS PATIENT: 30 minutes.   POSSIBLE D/C IN 1-2 DAYS, DEPENDING ON CLINICAL CONDITION.   Neita Carp M.D on 12/22/2017   Between 7am to 6pm - Pager - [phone]  After 6pm go to [email or web address] - password [email or web address] [facility]  Office  [phone]  CC: Primary care physician; Pleas Koch, NP  Note: This dictation was prepared with Dragon dictation along with smaller phrase technology. Any transcriptional errors that result from this process are unintentional.

## 2017-12-22 NOTE — Progress Notes (Signed)
Physical Therapy Treatment Patient Details Name: Megan Obrien MRN: [id number] DOB: 16-Aug-1946 Today's Date: 12/22/2017    History of Present Illness 72 y.o. female who presents with dizziness and expressive aphasia. Admitted 12/17/17 for stroke work up.  PMH includes heart failure, CKD, htn, OSA, PAH, DM, back and knee surgery.    PT Comments    Pt was seen for continued gait for 70 feet with slow pace and control of use of walker from unsafe galloping pattern to placing legs on the ground.  Pt is reporting previous falls from walker and discussed changing this habit to decrease fall risk.  Pt was able to manage self care in BR today, making excellent progress to decrease the length of time she will need to stay in SNF.  Follow acutely as needed for progressing her strength and balance.  Plan is to go home from SNF to live with independence.   Follow Up Recommendations  SNF     Equipment Recommendations  Standard walker    Recommendations for Other Services       Precautions / Restrictions Precautions Precautions: Fall Precaution Comments: pt is controlling her balance with stad walker by preference Restrictions Weight Bearing Restrictions: No    Mobility  Bed Mobility Overal bed mobility: Modified Independent             General bed mobility comments: bed flat; increased effort and time to perform on own  Transfers Overall transfer level: Needs assistance Equipment used: Standard walker Transfers: Sit to/from Stand Sit to Stand: Min assist;Mod assist         General transfer comment: standing with assistance of one  Ambulation/Gait Ambulation/Gait assistance: Min guard Ambulation Distance (Feet): 70 Feet Assistive device: Standard walker Gait Pattern/deviations: Step-to pattern Gait velocity: decreased Gait velocity interpretation: Below normal speed for age/gender General Gait Details: worked on walker control to decrease R knee pressure by placing legs  flat and not "galloping" walker   Stairs            Wheelchair Mobility    Modified Rankin (Stroke Patients Only)       Balance Overall balance assessment: Needs assistance;History of Falls Sitting-balance support: Feet supported Sitting balance-Leahy Scale: Good     Standing balance support: Bilateral upper extremity supported;During functional activity Standing balance-Leahy Scale: Fair Standing balance comment: UE support to manage R knee pain but also for fatigue                            Cognition Arousal/Alertness: Awake/alert Behavior During Therapy: WFL for tasks assessed/performed Overall Cognitive Status: Within Functional Limits for tasks assessed                                        Exercises Other Exercises Other Exercises: able to handle her own care in BR    General Comments        Pertinent Vitals/Pain Pain Assessment: Faces Faces Pain Scale: Hurts even more Pain Location: R knee Pain Descriptors / Indicators: Aching Pain Intervention(s): Limited activity within patient's tolerance;Monitored during session;Premedicated before session;Repositioned    Home Living                      Prior Function            PT Goals (current goals can now be found in  the care plan section) Acute Rehab PT Goals Patient Stated Goal: to go home Progress towards PT goals: Progressing toward goals    Frequency    7X/week      PT Plan Current plan remains appropriate    Co-evaluation              AM-PAC PT "6 Clicks" Daily Activity  Outcome Measure  Difficulty turning over in bed (including adjusting bedclothes, sheets and blankets)?: A Little Difficulty moving from lying on back to sitting on the side of the bed? : A Little Difficulty sitting down on and standing up from a chair with arms (e.g., wheelchair, bedside commode, etc,.)?: Unable Help needed moving to and from a bed to chair (including a  wheelchair)?: A Little Help needed walking in hospital room?: A Little Help needed climbing 3-5 steps with a railing? : A Lot 6 Click Score: 15    End of Session Equipment Utilized During Treatment: Gait belt Activity Tolerance: Patient limited by fatigue Patient left: in bed;with call bell/phone within reach;with family/visitor present Nurse Communication: Mobility status;Precautions PT Visit Diagnosis: Other abnormalities of gait and mobility (R26.89);Muscle weakness (generalized) (M62.81);History of falling (Z91.81);Repeated falls (R29.6);Difficulty in walking, not elsewhere classified (R26.2)     Time: 1521-1550 PT Time Calculation (min) (ACUTE ONLY): 29 min  Charges:  $Gait Training: 8-22 mins $Therapeutic Activity: 8-22 mins                    G Codes:  Functional Assessment Tool Used: AM-PAC 6 Clicks Basic Mobility     Ramond Dial 12/22/2017, 4:54 PM   Mee Hives, PT MS Acute Rehab Dept. Number: [place] and [place]

## 2017-12-22 NOTE — Progress Notes (Signed)
Pt able to demonstrate self administration of lovenox and insulin

## 2017-12-22 NOTE — Clinical Social Work Note (Signed)
Clinical Social Work Assessment  Patient Details  Name: Megan Obrien MRN: [id number] Date of Birth: [birth year]  Date of referral:  12/22/17               Reason for consult:  Facility Placement                Permission sought to share information with:  Chartered certified accountant granted to share information::  Yes, Verbal Permission Granted  Name::        Agency::  [facility]   Relationship::     Contact Information:     Housing/Transportation Living arrangements for the past 2 months:  [place] of Information:  Patient, Scientist, water quality, Adult Children Patient Interpreter Needed:  None Criminal Activity/Legal Involvement Pertinent to Current Situation/Hospitalization:  No - Comment as needed Significant Relationships:  Adult Children, [place], [employer] Lives with:  Self Do you feel safe going back to the place where you live?  Yes Need for family participation in patient care:  No (Coment)  Care giving concerns:  PT recommendation for STR  Social Worker assessment / plan:  The CSW met with the patient and her adult children at bedside to discuss discharge planning. The patient and her family agreed with the PT recommendation and gave verbal permission to begin bed referral process. The preferences for placement are as follow: [facility], [facility], or [facility].   The CSW has begun the referral process. Patient will dc tomorrow to the venue of her choice once bed offers are received.  Employment status:  Retired Nurse, adult PT Recommendations:  [place] / Referral to community resources:  [place]  Patient/Family's Response to care:  Patient and family thanked CSW  Patient/Family's Understanding of and Emotional Response to Diagnosis, Current Treatment, and Prognosis:  The patient and family understand and agree with the discharge  plan.  Emotional Assessment Appearance:  Appears stated age Attitude/Demeanor/Rapport:  Engaged, Self-Confident, Charismatic, Gracious Affect (typically observed):  Appropriate, Pleasant Orientation:  Oriented to Self, Oriented to Place, Oriented to  Time, Oriented to Situation Alcohol / Substance use:  Never Used Psych involvement (Current and /or in the community):  No (Comment)  Discharge Needs  Concerns to be addressed:  Care Coordination, Discharge Planning Concerns Readmission within the last 30 days:  No Current discharge risk:  Lives alone Barriers to Discharge:  Continued Medical Work up   [employer], LCSW 12/22/2017, 11:24 AM

## 2017-12-22 NOTE — Progress Notes (Addendum)
ANTICOAGULATION CONSULT NOTE - Follow Up Consult  Pharmacy Consult for warfarin Indication: atrial fibrillation, small thrombus left atrial appendage  No Known Allergies  Patient Measurements: Height: 5\' 6"  (167.6 cm) Weight: 271 lb 12.8 oz (123.3 kg) IBW/kg (Calculated) : 59.3  Vital Signs: Temp: 97.7 F (36.5 C) (03/24 0800) Temp Source: Oral (03/24 0800) BP: 140/60 (03/24 0800) Pulse Rate: 60 (03/24 0800)  Labs: Recent Labs    12/20/17 1213 12/21/17 0540 12/22/17 0345  HGB 12.9 12.5  --   HCT 37.1 36.7  --   PLT 157 162  --   LABPROT 13.2 14.1 14.5  INR 1.01 1.10 1.14  CREATININE 1.48*  --   --     Estimated Creatinine Clearance: 45.4 mL/min (A) (by C-G formula based on SCr of 1.48 mg/dL (H)).   Medical History: Past Medical History:  Diagnosis Date  . (HFpEF) heart failure with preserved ejection fraction ([place])    a. 03/2017 Echo: EF 55-60%, no rwma, Gr1 DD, mild to mod MS, mod to sev dil LA.  [name] Arthritis   . Carotid arterial disease ([place])    a. 11/2017 CTA Head/Neck: RICA 45, LICA 60.  [name] Cerebrovascular disease    a. 11/2017 MRA: mod to sev stenosis of bilat PCA's P2 segments; b. 11/2017 CTA Head/Neck: RICA 45, LICA 60, sev bilat distal MCA branch stenoses w/ sev bilat P2 stenoses.  . CKD (chronic kidney disease), stage III ([place])   . Depression   . GERD (gastroesophageal reflux disease)   . Hyperlipidemia   . Hypertension   . Left Atrial Appendage Thrombus    a. 11/2017 TEE (performed in setting of stroke/aphasia): nl EF, mod MS, dil LA w/ heavy smoke and small LAA thrombus-->Warfarin initiated.  . Mitral stenosis    a. Mild to moderate by echo 03/2017; b. 03/2017 R/LHC: PCWP 33mmHg, LVEDP 67mmHg. Mean grad of 66mmHg. Valve area of 2.02cm^2; c. 11/2017 TEE: Mod MS.  . Morbid obesity ([place])   . Murmur   . Non-obstructive CAD (coronary artery disease)    a. 03/2017 Cath: mild, non-obstructive CAD.  [name] OSA (obstructive sleep apnea)    a. Previously wore CPAP for  several years but stopped on her own and now just uses O2 via [place] @ night.  [name] PAH (pulmonary artery hypertension) ([place])    a. 03/2017 RHC: PA 81mmHg.  . Seasonal allergies   . Stroke [facility])    a. 11/2017 - presented w/ aphasia->MRI acute to early subacute ischemia w/in multiple vascular territories, largest in R frontal & L parietal lobes. Smaller foci of ischemia w/in R hippocampus, R parietal lobe, and L peripheral postcentral gyrus.  . Type 2 diabetes mellitus ([place])   . Urinary incontinence     Assessment: Pharmacy consulted to dose and monitor warfarin in this 72 year old female. This will be a new start for atrial fibrillation and a newly diagnosed small thrombus in left atrial appendage.   Goal of Therapy:  INR 2-3 Monitor platelets by anticoagulation protocol: Yes   Plan:  Discontinued heparin 5000 units TID for DVT prophylaxis Initiate warfarin 5 mg PO daily and follow INR daily. Patient is not on any medications that significantly interact with warfarin Enoxaparin 1 mg/kg subcutaneously q12h bridge therapy per discussion with MD.  3/22  INR = 1.01  Warfarin 5mg  3/23  INR = 1.10  Warfarin 5mg  3/24  INR = 1.14  Give Warfarin 10 mg PO tonight. CBC and SCr to be checked at least every  72 hours per protocol.    Patient provided handout and counseling on warfarin.   Olivia Canter, [facility] 12/22/17 8:57 AM

## 2017-12-23 ENCOUNTER — Telehealth: Payer: Self-pay

## 2017-12-23 ENCOUNTER — Encounter: Payer: Self-pay | Admitting: Cardiovascular Disease

## 2017-12-23 DIAGNOSIS — I69320 Aphasia following cerebral infarction: Secondary | ICD-10-CM | POA: Diagnosis not present

## 2017-12-23 DIAGNOSIS — I5030 Unspecified diastolic (congestive) heart failure: Secondary | ICD-10-CM | POA: Diagnosis not present

## 2017-12-23 DIAGNOSIS — S0990XA Unspecified injury of head, initial encounter: Secondary | ICD-10-CM | POA: Diagnosis not present

## 2017-12-23 DIAGNOSIS — Z7901 Long term (current) use of anticoagulants: Secondary | ICD-10-CM | POA: Diagnosis not present

## 2017-12-23 DIAGNOSIS — Y999 Unspecified external cause status: Secondary | ICD-10-CM | POA: Diagnosis not present

## 2017-12-23 DIAGNOSIS — Z743 Need for continuous supervision: Secondary | ICD-10-CM | POA: Diagnosis not present

## 2017-12-23 DIAGNOSIS — I10 Essential (primary) hypertension: Secondary | ICD-10-CM | POA: Diagnosis not present

## 2017-12-23 DIAGNOSIS — I130 Hypertensive heart and chronic kidney disease with heart failure and stage 1 through stage 4 chronic kidney disease, or unspecified chronic kidney disease: Secondary | ICD-10-CM | POA: Diagnosis not present

## 2017-12-23 DIAGNOSIS — E785 Hyperlipidemia, unspecified: Secondary | ICD-10-CM | POA: Diagnosis not present

## 2017-12-23 DIAGNOSIS — K219 Gastro-esophageal reflux disease without esophagitis: Secondary | ICD-10-CM | POA: Diagnosis not present

## 2017-12-23 DIAGNOSIS — W010XXA Fall on same level from slipping, tripping and stumbling without subsequent striking against object, initial encounter: Secondary | ICD-10-CM | POA: Diagnosis not present

## 2017-12-23 DIAGNOSIS — E1122 Type 2 diabetes mellitus with diabetic chronic kidney disease: Secondary | ICD-10-CM | POA: Diagnosis not present

## 2017-12-23 DIAGNOSIS — R6889 Other general symptoms and signs: Secondary | ICD-10-CM | POA: Diagnosis not present

## 2017-12-23 DIAGNOSIS — Z7401 Bed confinement status: Secondary | ICD-10-CM | POA: Diagnosis not present

## 2017-12-23 DIAGNOSIS — Z8673 Personal history of transient ischemic attack (TIA), and cerebral infarction without residual deficits: Secondary | ICD-10-CM | POA: Diagnosis not present

## 2017-12-23 DIAGNOSIS — S199XXA Unspecified injury of neck, initial encounter: Secondary | ICD-10-CM | POA: Diagnosis not present

## 2017-12-23 DIAGNOSIS — I481 Persistent atrial fibrillation: Secondary | ICD-10-CM | POA: Diagnosis not present

## 2017-12-23 DIAGNOSIS — R4701 Aphasia: Secondary | ICD-10-CM | POA: Diagnosis not present

## 2017-12-23 DIAGNOSIS — Y939 Activity, unspecified: Secondary | ICD-10-CM | POA: Diagnosis not present

## 2017-12-23 DIAGNOSIS — N183 Chronic kidney disease, stage 3 (moderate): Secondary | ICD-10-CM | POA: Diagnosis not present

## 2017-12-23 DIAGNOSIS — I69398 Other sequelae of cerebral infarction: Secondary | ICD-10-CM | POA: Diagnosis not present

## 2017-12-23 DIAGNOSIS — I69393 Ataxia following cerebral infarction: Secondary | ICD-10-CM | POA: Diagnosis not present

## 2017-12-23 DIAGNOSIS — M1990 Unspecified osteoarthritis, unspecified site: Secondary | ICD-10-CM | POA: Diagnosis not present

## 2017-12-23 DIAGNOSIS — M6281 Muscle weakness (generalized): Secondary | ICD-10-CM | POA: Diagnosis not present

## 2017-12-23 DIAGNOSIS — Z79899 Other long term (current) drug therapy: Secondary | ICD-10-CM | POA: Diagnosis not present

## 2017-12-23 DIAGNOSIS — Z794 Long term (current) use of insulin: Secondary | ICD-10-CM | POA: Diagnosis not present

## 2017-12-23 DIAGNOSIS — W1830XA Fall on same level, unspecified, initial encounter: Secondary | ICD-10-CM | POA: Diagnosis not present

## 2017-12-23 DIAGNOSIS — I509 Heart failure, unspecified: Secondary | ICD-10-CM | POA: Diagnosis not present

## 2017-12-23 DIAGNOSIS — S6991XA Unspecified injury of right wrist, hand and finger(s), initial encounter: Secondary | ICD-10-CM | POA: Diagnosis not present

## 2017-12-23 DIAGNOSIS — Y92129 Unspecified place in nursing home as the place of occurrence of the external cause: Secondary | ICD-10-CM | POA: Diagnosis not present

## 2017-12-23 DIAGNOSIS — E119 Type 2 diabetes mellitus without complications: Secondary | ICD-10-CM | POA: Diagnosis not present

## 2017-12-23 DIAGNOSIS — G4730 Sleep apnea, unspecified: Secondary | ICD-10-CM | POA: Diagnosis not present

## 2017-12-23 LAB — PROTIME-INR
INR: 1.29
Prothrombin Time: 16 seconds — ABNORMAL HIGH (ref 11.4–15.2)

## 2017-12-23 LAB — GLUCOSE, CAPILLARY
GLUCOSE-CAPILLARY: 119 mg/dL — AB (ref 65–99)
Glucose-Capillary: 256 mg/dL — ABNORMAL HIGH (ref 65–99)

## 2017-12-23 MED ORDER — WARFARIN SODIUM 7.5 MG PO TABS: 7.5000 mg | ORAL_TABLET | Freq: Once | ORAL | Status: DC

## 2017-12-23 MED ORDER — AMLODIPINE BESYLATE 5 MG PO TABS: 5.0000 mg | ORAL_TABLET | Freq: Every day | ORAL | Status: DC

## 2017-12-23 MED ORDER — WARFARIN SODIUM 10 MG PO TABS
10.0000 mg | ORAL_TABLET | Freq: Once | ORAL | Status: DC
  Filled 2017-12-23: qty 1

## 2017-12-23 MED ORDER — ENOXAPARIN SODIUM 150 MG/ML ~~LOC~~ SOLN: 1.0000 mg/kg | Freq: Two times a day (BID) | SUBCUTANEOUS | 0 refills | Status: DC

## 2017-12-23 NOTE — Progress Notes (Signed)
Pt VS stable. A&Ox4 with minimal expressive aphasia. Report called to RN at [facility]. EMS to transport to and Son is to meet EMS there with belongings. IVs removed. Medication prescriptions sent in packet to facility. Will continue to monitor until transport.  Dorna Bloom, LPN

## 2017-12-23 NOTE — Discharge Summary (Signed)
[place] [facility] [place] NAME: Megan Obrien    MR#:  [id number]  DATE OF BIRTH:  12-10-1945  DATE OF ADMISSION:  12/17/2017 ADMITTING PHYSICIAN: Lance Coon, MD  DATE OF DISCHARGE: No discharge date for patient encounter.  PRIMARY CARE PHYSICIAN: Pleas Koch, NP   ADMISSION DIAGNOSIS:  Dizziness, weakness  DISCHARGE DIAGNOSIS:  Principal Problem:   Expressive aphasia Active Problems:   Hyperlipidemia   Essential hypertension   Type 2 diabetes mellitus with complication, without long-term current use of insulin (HCC)   Depression   GERD (gastroesophageal reflux disease)   Stroke ([place])   SECONDARY DIAGNOSIS:   Past Medical History:  Diagnosis Date  . (HFpEF) heart failure with preserved ejection fraction ([place])    a. 03/2017 Echo: EF 55-60%, no rwma, Gr1 DD, mild to mod MS, mod to sev dil LA.  [name] Arthritis   . Carotid arterial disease ([place])    a. 11/2017 CTA Head/Neck: RICA 45, LICA 60.  [name] Cerebrovascular disease    a. 11/2017 MRA: mod to sev stenosis of bilat PCA's P2 segments; b. 11/2017 CTA Head/Neck: RICA 45, LICA 60, sev bilat distal MCA branch stenoses w/ sev bilat P2 stenoses.  . CKD (chronic kidney disease), stage III ([place])   . Depression   . GERD (gastroesophageal reflux disease)   . Hyperlipidemia   . Hypertension   . Left Atrial Appendage Thrombus    a. 11/2017 TEE (performed in setting of stroke/aphasia): nl EF, mod MS, dil LA w/ heavy smoke and small LAA thrombus-->Warfarin initiated.  . Mitral stenosis    a. Mild to moderate by echo 03/2017; b. 03/2017 R/LHC: PCWP 13mHg, LVEDP 173mg. Mean grad of 51m29m. Valve area of 2.02cm^2; c. 11/2017 TEE: Mod MS.  . Morbid obesity ([place] . Murmur   . Non-obstructive CAD (coronary artery disease)    a. 03/2017 Cath: mild, non-obstructive CAD.  . OMarland KitchenA (obstructive sleep apnea)    a. Previously wore CPAP for several years but stopped on her own and now just uses O2 via [place] @ night.   . PMarland KitchenH (pulmonary artery hypertension) ([facility]  a. 03/2017 RHC: PA 31m54m  . Seasonal allergies   . [facility] - presented w/ aphasia->MRI acute to early subacute ischemia w/in multiple vascular territories, largest in R frontal & L parietal lobes. Smaller foci of ischemia w/in R hippocampus, R parietal lobe, and L peripheral postcentral gyrus.  . Type 2 diabetes mellitus (HCC)[place]. Urinary incontinence      ADMITTING HISTORY  HISTORY OF PRESENT ILLNESS:  Megan Obrien a 72 y31. female who presents with dizziness and expressive aphasia.  Patient states symptoms started in the morning, but she attributed them to something else, or felt that they would pass.  They progressed during the day and in the evening she came to ED for evaluation.  Initial workup was largely within normal limits, but her symptoms are persistent and highly concerning for stroke.  Hospitalist were called for admission     HOSPITAL COURSE:   *Expressive aphasia -acute bilateral embolic CVA   Echo - Severe mitral stenosis and calcifications, LVH pattern.   CTA head and neck done-no stenosis on large arteries   LDL is high- started on lipitor. TEE showed atrial thrombus.  Started on Coumadin with Lovenox bridging.     * A fib, left atrial thrombus   Started on coumadin and Lovenox.  Check PT/INR in 3 days and further dosing per physician at SNF. Stop lovenox once INR >2. Goal INR 2-3  * DM Oral medications held in the hospital SSI, lantus Resume home meds  * Hyperlipidemia   Started atorvastatin.  * Hypertension, uncontrolled   On Coreg, lisinopril. Added norvasc Blood pressure is improved today.  Stable for discharge to SNF   Follow up with Dr. Fletcher Anon of cardiology in 1-2 weeks    CONSULTS OBTAINED:  Treatment Team:  Alexis Goodell, MD Wellington Hampshire, MD  DRUG ALLERGIES:  No Known Allergies  DISCHARGE MEDICATIONS:   Allergies as of 12/23/2017   No Known  Allergies     Medication List    TAKE these medications   ACCU-CHEK AVIVA PLUS test strip Generic drug:  glucose blood USE ONE STRIP TO CHECK GLUCOSE ONCE TO TWICE DAILY AND AS DIRECTED   ACCU-CHEK AVIVA PLUS w/Device Kit Use as directed to check blood sugar 2 times daily.   acetaminophen 650 MG CR tablet Commonly known as:  TYLENOL Take 1,300-1,950 mg by mouth daily as needed for pain.   amLODipine 5 MG tablet Commonly known as:  NORVASC Take 1 tablet (5 mg total) by mouth daily. Start taking on:  12/24/2017   atorvastatin 40 MG tablet Commonly known as:  LIPITOR Take 1 tablet (40 mg total) by mouth daily.   buPROPion 150 MG 12 hr tablet Commonly known as:  WELLBUTRIN SR TAKE 1 TABLET BY MOUTH TWICE DAILY   carvedilol 12.5 MG tablet Commonly known as:  COREG Take 1.5 tablets (18.75 mg total) by mouth 2 (two) times daily.   enoxaparin 150 MG/ML injection Commonly known as:  LOVENOX Inject 0.82 mLs (125 mg total) into the skin every 12 (twelve) hours.   ferrous sulfate 325 (65 FE) MG tablet Take 325 mg by mouth daily.   FLUoxetine 40 MG capsule Commonly known as:  PROZAC TAKE 1 CAPSULE BY MOUTH ONCE DAILY   fluticasone 50 MCG/ACT nasal spray Commonly known as:  FLONASE Place 1 spray into both nostrils daily. What changed:  how much to take   furosemide 40 MG tablet Commonly known as:  LASIX Take 1 tablet (40 mg total) by mouth daily.   glimepiride 4 MG tablet Commonly known as:  AMARYL TAKE 2 TABLETS BY MOUTH ONCE DAILY WITH  BREAKFAST   lisinopril 40 MG tablet Commonly known as:  PRINIVIL,ZESTRIL Take 1 tablet (40 mg total) by mouth daily.   Melatonin 10 MG Tabs Take 10 mg by mouth at bedtime.   Omega-3 1000 MG Caps Take 1,000 mg by mouth daily.   omeprazole 40 MG capsule Commonly known as:  PRILOSEC TAKE 1 CAPSULE BY MOUTH ONCE DAILY   oxybutynin 5 MG tablet Commonly known as:  DITROPAN TAKE 1 TABLET BY MOUTH THREE TIMES DAILY    triamcinolone cream 0.1 % Commonly known as:  KENALOG Apply 1 application topically 2 (two) times daily. What changed:    when to take this  reasons to take this   Vitamin D3 5000 units Caps Take 5,000 Units by mouth daily.   warfarin 7.5 MG tablet Commonly known as:  COUMADIN Take 1 tablet (7.5 mg total) by mouth one time only at 6 PM.       Today   VITAL SIGNS:  Blood pressure (!) 127/51, pulse (!) 130, temperature 97.7 F (36.5 C), temperature source Oral, resp. rate 18, height 5' 6" (1.676 m), weight 123.3 kg (271 lb 12.8 oz), SpO2 99 %.  I/O:    Intake/Output Summary (Last 24 hours) at 12/23/2017 1028 Last data filed at 12/23/2017 0900 Gross per 24 hour  Intake 360 ml  Output -  Net 360 ml    PHYSICAL EXAMINATION:  Physical Exam  GENERAL:  72 y.o.-year-old patient lying in the bed with no acute distress.  LUNGS: Normal breath sounds bilaterally, no wheezing, rales,rhonchi or crepitation. No use of accessory muscles of respiration.  CARDIOVASCULAR: S1, S2 normal. No murmurs, rubs, or gallops.  ABDOMEN: Soft, non-tender, non-distended. Bowel sounds present. No organomegaly or mass.  NEUROLOGIC: Moves all 4 extremities. PSYCHIATRIC: The patient is alert and oriented x 3.  SKIN: No obvious rash, lesion, or ulcer.   DATA REVIEW:   CBC Recent Labs  Lab 12/21/17 0540  WBC 6.0  HGB 12.5  HCT 36.7  PLT 162    Chemistries  Recent Labs  Lab 12/17/17 2347  12/20/17 1213  NA 135   < > 137  K 4.1   < > 3.9  CL 98*   < > 100*  CO2 29   < > 27  GLUCOSE 404*   < > 197*  BUN 31*   < > 30*  CREATININE 1.60*   < > 1.48*  CALCIUM 9.2   < > 8.7*  AST 20  --   --   ALT 13*  --   --   ALKPHOS 75  --   --   BILITOT 0.6  --   --    < > = values in this interval not displayed.    Cardiac Enzymes Recent Labs  Lab 12/17/17 2347  TROPONINI <0.03    Microbiology Results  Results for orders placed or performed in visit on 07/24/16  Urine culture      Status: None   Collection Time: 07/24/16  1:11 PM  Result Value Ref Range Status   Culture KLEBSIELLA PNEUMONIAE  Final   Colony Count 50,000-100,000 CFU/mL  Final   Organism ID, Bacteria KLEBSIELLA PNEUMONIAE  Final      Susceptibility   Klebsiella pneumoniae -  (no method available)    AMPICILLIN >=32 Resistant     AMOX/CLAVULANIC <=2 Sensitive     AMPICILLIN/SULBACTAM 4 Sensitive     PIP/TAZO 8 Sensitive     IMIPENEM <=0.25 Sensitive     CEFAZOLIN <=4 Not Reportable     CEFTRIAXONE <=1 Sensitive     CEFTAZIDIME <=1 Sensitive     CEFEPIME <=1 Sensitive     GENTAMICIN <=1 Sensitive     TOBRAMYCIN <=1 Sensitive     CIPROFLOXACIN <=0.25 Sensitive     LEVOFLOXACIN <=0.12 Sensitive     NITROFURANTOIN 64 Intermediate     TRIMETH/SULFA* <=20 Sensitive      * NR=NOT REPORTABLE,SEE COMMENTORAL therapy:A cefazolin MIC of <32 predicts susceptibility to the oral agents cefaclor,cefdinir,cefpodoxime,cefprozil,cefuroxime,cephalexin,and loracarbef when used for therapy of uncomplicated UTIs due to E.coli,K.pneumomiae,and P.mirabilis. PARENTERAL therapy: A cefazolinMIC of >8 indicates resistance to parenteralcefazolin. An alternate test method must beperformed to confirm susceptibility to parenteralcefazolin.    RADIOLOGY:  No results found.  Follow up with PCP in 1 week.  Management plans discussed with the patient, family and they are in agreement.  CODE STATUS:     Code Status Orders  (From admission, onward)        Start     Ordered   12/18/17 0501  Full code  Continuous     12/18/17 0500    Code Status History  Date Active Date Inactive Code Status Order ID Comments User Context   04/25/2017 1033 04/25/2017 1548 Full Code [id number]  Wellington Hampshire, MD Inpatient    Advance Directive Documentation     Most Recent Value  Type of Advance Directive  Healthcare Power of Attorney, Living will  Pre-existing out of facility DNR order (yellow form or pink MOST form)  -  "MOST"  Form in Place?  -      TOTAL TIME TAKING CARE OF THIS PATIENT ON DAY OF DISCHARGE: more than 30 minutes.   Leia Alf  M.D on 12/23/2017 at 10:28 AM  Between 7am to 6pm - Pager - [phone]  After 6pm go to [email or web address] - password [email or web address] [facility]  Office  [phone]  CC: Primary care physician; Pleas Koch, NP  Note: This dictation was prepared with Dragon dictation along with smaller phrase technology. Any transcriptional errors that result from this process are unintentional.

## 2017-12-23 NOTE — Progress Notes (Signed)
ANTICOAGULATION CONSULT NOTE - Follow Up Consult  Pharmacy Consult for warfarin Indication: atrial fibrillation, small thrombus left atrial appendage  No Known Allergies  Patient Measurements: Height: 5\' 6"  (167.6 cm) Weight: 271 lb 12.8 oz (123.3 kg) IBW/kg (Calculated) : 59.3  Vital Signs: Temp: 97.6 F (36.4 C) (03/25 0436) Temp Source: Oral (03/25 0436) BP: 153/61 (03/25 0436) Pulse Rate: 54 (03/25 0436)  Labs: Recent Labs    12/20/17 1213 12/21/17 0540 12/22/17 0345 12/23/17 0408  HGB 12.9 12.5  --   --   HCT 37.1 36.7  --   --   PLT 157 162  --   --   LABPROT 13.2 14.1 14.5 16.0*  INR 1.01 1.10 1.14 1.29  CREATININE 1.48*  --   --   --     Estimated Creatinine Clearance: 45.4 mL/min (A) (by C-G formula based on SCr of 1.48 mg/dL (H)).   Assessment: Pharmacy consulted to dose and monitor warfarin in this 72 year old female. This will be a new start for atrial fibrillation and a newly diagnosed small thrombus in left atrial appendage.   Goal of Therapy:  INR 2-3 Monitor platelets by anticoagulation protocol: Yes   Dosing History: Date INR Dose 3/22 1 5  mg 3/23 1.1 5 mg 3/24 1.1 10 mg 3/25 1.3    Plan:  Warfarin 10 mg PO this evening per protocol. Continue enoxaparin 1 mg/kg subcutaneously q12h bridge therapy until INR is therapeutic per discussion with MD.  Will follow INR daily. Patient is not on any medications that significantly interact with warfarin. CBC to be checked q 72 hours per protocol. Patient provided handout and counseling on warfarin on 3/22.  Lenis Noon, PharmD, BCPS Clinical Pharmacist 12/23/17 7:58 AM

## 2017-12-23 NOTE — Telephone Encounter (Signed)
Unable to contact pt, will mail letter  L MOM to schedule

## 2017-12-23 NOTE — Clinical Social Work Placement (Signed)
   CLINICAL SOCIAL WORK PLACEMENT  NOTE  Date:  12/23/2017  Patient Details  Name: Megan Obrien MRN: [id number] Date of Birth: 03/15/1946  Clinical Social Work is seeking post-discharge placement for this patient at the [place] level of care (*CSW will initial, date and re-position this form in  chart as items are completed):  Yes   Patient/family provided with [place] Work Department's list of facilities offering this level of care within the geographic area requested by the patient (or if unable, by the patient's family).  Yes   Patient/family informed of their freedom to choose among providers that offer the needed level of care, that participate in Medicare, Medicaid or managed care program needed by the patient, have an available bed and are willing to accept the patient.  Yes   Patient/family informed of [facility]'s ownership interest in [facility] and [facility], as well as of the fact that they are under no obligation to receive care at these facilities.  PASRR submitted to EDS on       PASRR number received on       Existing PASRR number confirmed on 12/22/17     FL2 transmitted to all facilities in geographic area requested by pt/family on 12/22/17     FL2 transmitted to all facilities within larger geographic area on       Patient informed that his/her managed care company has contracts with or will negotiate with certain facilities, including the following:        Yes   Patient/family informed of bed offers received.  Patient [facility] bed at [facility]     Physician recommends and patient chooses bed at      Patient to be transferred to [facility] on 12/23/17.  Patient to be transferred to facility by [facility]     Patient family notified on 12/23/17 of transfer.  Name of family member notified:  Patient's son Jeneen Rinks was at the bedside and is aware that she is  discharging today.     PHYSICIAN       Additional Comment:    _______________________________________________ Ross Ludwig, LCSWA 12/23/2017, 2:33 PM

## 2017-12-23 NOTE — Clinical Social Work Note (Addendum)
[facility] number has been received it is [phone], patient was presented bed offers and would like [facility], CSW was informed that [facility] can accept patient today, once insurance Josem Kaufmann has been received.  CSW received phone call from [facility] that they have received insurance authorization.  Patient to be d/c'ed today to [facility] room 405.  Patient and family agreeable to plans will transport via ems RN to call report to [phone].  Jones Broom. [place], MSW, [place]  12/23/2017 10:25 AM

## 2017-12-24 ENCOUNTER — Other Ambulatory Visit: Payer: Self-pay | Admitting: *Deleted

## 2017-12-24 DIAGNOSIS — R4701 Aphasia: Secondary | ICD-10-CM

## 2017-12-24 DIAGNOSIS — E1122 Type 2 diabetes mellitus with diabetic chronic kidney disease: Secondary | ICD-10-CM | POA: Diagnosis not present

## 2017-12-24 DIAGNOSIS — I69320 Aphasia following cerebral infarction: Secondary | ICD-10-CM | POA: Diagnosis not present

## 2017-12-24 DIAGNOSIS — N183 Chronic kidney disease, stage 3 (moderate): Secondary | ICD-10-CM | POA: Diagnosis not present

## 2017-12-24 DIAGNOSIS — Z794 Long term (current) use of insulin: Secondary | ICD-10-CM | POA: Diagnosis not present

## 2017-12-24 DIAGNOSIS — I481 Persistent atrial fibrillation: Secondary | ICD-10-CM | POA: Diagnosis not present

## 2017-12-24 NOTE — Consult Note (Signed)
[facility] Care Management follow up.  Chart reviewed. Noted discharge disposition changed to SNF- [facility].   Made [facility] RNCM aware. Will make referral to [facility] LCSW for follow up while at [facility].  Marthenia Rolling, MSN-Ed, RN,BSN [facility] Liaison [phone]

## 2017-12-27 ENCOUNTER — Other Ambulatory Visit: Payer: Self-pay

## 2017-12-27 ENCOUNTER — Emergency Department
Admission: EM | Admit: 2017-12-27 | Discharge: 2017-12-27 | Disposition: A | Discharge status: Skilled Nursing Facility | Payer: Medicare Other | Attending: Emergency Medicine | Admitting: Emergency Medicine

## 2017-12-27 ENCOUNTER — Emergency Department: Payer: Medicare Other

## 2017-12-27 ENCOUNTER — Encounter: Payer: Self-pay | Admitting: Emergency Medicine

## 2017-12-27 DIAGNOSIS — E119 Type 2 diabetes mellitus without complications: Secondary | ICD-10-CM | POA: Insufficient documentation

## 2017-12-27 DIAGNOSIS — I10 Essential (primary) hypertension: Secondary | ICD-10-CM | POA: Diagnosis not present

## 2017-12-27 DIAGNOSIS — Y939 Activity, unspecified: Secondary | ICD-10-CM | POA: Insufficient documentation

## 2017-12-27 DIAGNOSIS — Y92129 Unspecified place in nursing home as the place of occurrence of the external cause: Secondary | ICD-10-CM | POA: Insufficient documentation

## 2017-12-27 DIAGNOSIS — N183 Chronic kidney disease, stage 3 (moderate): Secondary | ICD-10-CM | POA: Insufficient documentation

## 2017-12-27 DIAGNOSIS — W19XXXA Unspecified fall, initial encounter: Secondary | ICD-10-CM

## 2017-12-27 DIAGNOSIS — W010XXA Fall on same level from slipping, tripping and stumbling without subsequent striking against object, initial encounter: Secondary | ICD-10-CM | POA: Insufficient documentation

## 2017-12-27 DIAGNOSIS — I509 Heart failure, unspecified: Secondary | ICD-10-CM | POA: Diagnosis not present

## 2017-12-27 DIAGNOSIS — Z79899 Other long term (current) drug therapy: Secondary | ICD-10-CM | POA: Diagnosis not present

## 2017-12-27 DIAGNOSIS — I130 Hypertensive heart and chronic kidney disease with heart failure and stage 1 through stage 4 chronic kidney disease, or unspecified chronic kidney disease: Secondary | ICD-10-CM | POA: Insufficient documentation

## 2017-12-27 DIAGNOSIS — S0990XA Unspecified injury of head, initial encounter: Secondary | ICD-10-CM | POA: Insufficient documentation

## 2017-12-27 DIAGNOSIS — Z7901 Long term (current) use of anticoagulants: Secondary | ICD-10-CM | POA: Insufficient documentation

## 2017-12-27 DIAGNOSIS — S6991XA Unspecified injury of right wrist, hand and finger(s), initial encounter: Secondary | ICD-10-CM | POA: Diagnosis not present

## 2017-12-27 DIAGNOSIS — S199XXA Unspecified injury of neck, initial encounter: Secondary | ICD-10-CM | POA: Diagnosis not present

## 2017-12-27 DIAGNOSIS — Y999 Unspecified external cause status: Secondary | ICD-10-CM | POA: Insufficient documentation

## 2017-12-27 LAB — COMPREHENSIVE METABOLIC PANEL
ALK PHOS: 77 U/L (ref 38–126)
ALT: 57 U/L — ABNORMAL HIGH (ref 14–54)
ANION GAP: 12 (ref 5–15)
AST: 33 U/L (ref 15–41)
Albumin: 3.2 g/dL — ABNORMAL LOW (ref 3.5–5.0)
BUN: 39 mg/dL — ABNORMAL HIGH (ref 6–20)
CALCIUM: 8.7 mg/dL — AB (ref 8.9–10.3)
CO2: 25 mmol/L (ref 22–32)
Chloride: 99 mmol/L — ABNORMAL LOW (ref 101–111)
Creatinine, Ser: 1.66 mg/dL — ABNORMAL HIGH (ref 0.44–1.00)
GFR, EST AFRICAN AMERICAN: 34 mL/min — AB (ref >60)
GFR, EST NON AFRICAN AMERICAN: 30 mL/min — AB (ref >60)
Glucose, Bld: 355 mg/dL — ABNORMAL HIGH (ref 65–99)
Potassium: 4.2 mmol/L (ref 3.5–5.1)
Sodium: 136 mmol/L (ref 135–145)
Total Bilirubin: 0.9 mg/dL (ref 0.3–1.2)
Total Protein: 7 g/dL (ref 6.5–8.1)

## 2017-12-27 LAB — CBC WITH DIFFERENTIAL/PLATELET
Basophils Absolute: 0 10*3/uL (ref 0–0.1)
Basophils Relative: 0 %
EOS PCT: 3 %
Eosinophils Absolute: 0.2 10*3/uL (ref 0–0.7)
HCT: 34.5 % — ABNORMAL LOW (ref 35.0–47.0)
Hemoglobin: 11.5 g/dL — ABNORMAL LOW (ref 12.0–16.0)
LYMPHS ABS: 0.8 10*3/uL — AB (ref 1.0–3.6)
LYMPHS PCT: 11 %
MCH: 30.8 pg (ref 26.0–34.0)
MCHC: 33.3 g/dL (ref 32.0–36.0)
MCV: 92.5 fL (ref 80.0–100.0)
MONOS PCT: 8 %
Monocytes Absolute: 0.5 10*3/uL (ref 0.2–0.9)
Neutro Abs: 5.6 10*3/uL (ref 1.4–6.5)
Neutrophils Relative %: 78 %
PLATELETS: 207 10*3/uL (ref 150–440)
RBC: 3.73 MIL/uL — AB (ref 3.80–5.20)
RDW: 13.6 % (ref 11.5–14.5)
WBC: 7.1 10*3/uL (ref 3.6–11.0)

## 2017-12-27 LAB — GLUCOSE, CAPILLARY: GLUCOSE-CAPILLARY: 307 mg/dL — AB (ref 65–99)

## 2017-12-27 NOTE — ED Provider Notes (Signed)
[facility] Emergency Department Provider Note  Time seen: 2:28 PM  I have reviewed the triage vital signs and the nursing notes.   HISTORY  Chief Complaint Fall    HPI Megan Obrien is a 72 y.o. female with a past medical history of CHF, arthritis, CKD, depression, gastric reflux, hypertension, hyperlipidemia, obesity, presents from her nursing facility after a fall.  According to the patient she was at her nursing home, attempted to get up to use the restroom reached for her walker, but the walker pushed away from her, she states she fell down attempting to stabilize herself, hit the back of her head on the ground.  Denies LOC.  Denies vomiting.  Largely negative review of systems otherwise.  Patient states she last had a fall approximately 1 week ago, does have older appearing bruising consistent with said fall 1 week ago.   Past Medical History:  Diagnosis Date  . (HFpEF) heart failure with preserved ejection fraction ([place])    a. 03/2017 Echo: EF 55-60%, no rwma, Gr1 DD, mild to mod MS, mod to sev dil LA.  [name] Arthritis   . Carotid arterial disease ([place])    a. 11/2017 CTA Head/Neck: RICA 45, LICA 60.  [name] Cerebrovascular disease    a. 11/2017 MRA: mod to sev stenosis of bilat PCA's P2 segments; b. 11/2017 CTA Head/Neck: RICA 45, LICA 60, sev bilat distal MCA branch stenoses w/ sev bilat P2 stenoses.  . CKD (chronic kidney disease), stage III ([place])   . Depression   . GERD (gastroesophageal reflux disease)   . Hyperlipidemia   . Hypertension   . Left Atrial Appendage Thrombus    a. 11/2017 TEE (performed in setting of stroke/aphasia): nl EF, mod MS, dil LA w/ heavy smoke and small LAA thrombus-->Warfarin initiated.  . Mitral stenosis    a. Mild to moderate by echo 03/2017; b. 03/2017 R/LHC: PCWP 58mmHg, LVEDP 72mmHg. Mean grad of 61mmHg. Valve area of 2.02cm^2; c. 11/2017 TEE: Mod MS.  . Morbid obesity ([place])   . Murmur   . Non-obstructive CAD (coronary artery  disease)    a. 03/2017 Cath: mild, non-obstructive CAD.  [name] OSA (obstructive sleep apnea)    a. Previously wore CPAP for several years but stopped on her own and now just uses O2 via [place] @ night.  [name] PAH (pulmonary artery hypertension) ([place])    a. 03/2017 RHC: PA 11mmHg.  . Seasonal allergies   . Stroke [facility])    a. 11/2017 - presented w/ aphasia->MRI acute to early subacute ischemia w/in multiple vascular territories, largest in R frontal & L parietal lobes. Smaller foci of ischemia w/in R hippocampus, R parietal lobe, and L peripheral postcentral gyrus.  . Type 2 diabetes mellitus ([place])   . Urinary incontinence     Patient Active Problem List   Diagnosis Date Noted  . Expressive aphasia 12/18/2017  . Stroke ([place]) 12/18/2017  . Mitral valve disease   . Exertional dyspnea 04/17/2017  . Fatigue 02/20/2017  . 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 Medicare annual wellness visit, subsequent 07/24/2016  . Hyperlipidemia 02/06/2016  . Essential hypertension 02/06/2016  . Type 2 diabetes mellitus with complication, without long-term current use of insulin ([place]) 02/06/2016  . Depression 02/06/2016  . GERD (gastroesophageal reflux disease) 02/06/2016  . Urge incontinence of urine 02/06/2016    Past Surgical History:  Procedure Laterality Date  . BACK SURGERY    . CARDIAC CATHETERIZATION    . KNEE SURGERY Left   .  RIGHT/LEFT HEART CATH AND CORONARY ANGIOGRAPHY Bilateral 04/25/2017   Procedure: Right/Left Heart Cath and Coronary Angiography;  Surgeon: Wellington Hampshire, MD;  Location: [place] CV [facility];  Service: Cardiovascular;  Laterality: Bilateral;  . TEE WITHOUT CARDIOVERSION N/A 12/20/2017   Procedure: TRANSESOPHAGEAL ECHOCARDIOGRAM (TEE);  Surgeon: Wellington Hampshire, MD;  Location: [facility] ORS;  Service: Cardiovascular;  Laterality: N/A;  . [place]    Prior to Admission medications   Medication Sig Start Date End Date Taking? Authorizing Provider  acetaminophen (TYLENOL)  650 MG CR tablet Take 650 mg by mouth every 8 (eight) hours as needed for pain.    Yes [provider]  amLODipine (NORVASC) 5 MG tablet Take 1 tablet (5 mg total) by mouth daily. 12/24/17  Yes Sudini, Alveta Heimlich, MD  apixaban (ELIQUIS) 5 MG TABS tablet Take 5 mg by mouth 2 (two) times daily.   Yes [provider]  atorvastatin (LIPITOR) 40 MG tablet Take 1 tablet (40 mg total) by mouth daily. Patient taking differently: Take 40 mg by mouth at bedtime.  01/17/17  Yes Pleas Koch, NP  buPROPion [facility] SR) 150 MG 12 hr tablet Take 150 mg by mouth 2 (two) times daily.   Yes [provider]  carvedilol (COREG) 12.5 MG tablet Take 18.75 mg by mouth 2 (two) times daily.   Yes [provider]  Cholecalciferol (VITAMIN D-3) 5000 units TABS Take 5,000 Units by mouth daily.   Yes [provider]  ferrous sulfate 325 (65 FE) MG tablet Take 325 mg by mouth daily.   Yes [provider]  FLUoxetine (PROZAC) 40 MG capsule Take 40 mg by mouth daily.   Yes [provider]  fluticasone (FLONASE) 50 MCG/ACT nasal spray Place 1 spray into both nostrils daily. 02/21/17  Yes Pleas Koch, NP  furosemide (LASIX) 40 MG tablet Take 40 mg by mouth daily.   Yes [provider]  glimepiride (AMARYL) 4 MG tablet Take 8 mg by mouth daily.   Yes [provider]  lisinopril (PRINIVIL,ZESTRIL) 40 MG tablet Take 40 mg by mouth daily.   Yes [provider]  Melatonin 10 MG TABS Take 10 mg by mouth at bedtime.   Yes [provider]  nystatin (MYCOSTATIN/NYSTOP) powder Apply topically 2 (two) times daily. Apply to abdominal folds 12/26/17 01/09/18 Yes [provider]  omega-3 acid ethyl esters (LOVAZA) 1 g capsule Take 1 g by mouth daily.   Yes [provider]  omeprazole (PRILOSEC) 40 MG capsule Take 40 mg by mouth daily.   Yes [provider]  oxybutynin (DITROPAN-XL) 5 MG 24 hr tablet Take 5 mg by  mouth 3 (three) times daily.   Yes [provider]  triamcinolone cream (KENALOG) 0.1 % Apply 1 application topically 2 (two) times daily. 08/30/16  Yes Pleas Koch, NP  enoxaparin (LOVENOX) 150 MG/ML injection Inject 0.82 mLs (125 mg total) into the skin every 12 (twelve) hours. Patient not taking: Reported on 12/27/2017 12/23/17   Hillary Bow, MD  warfarin (COUMADIN) 7.5 MG tablet Take 1 tablet (7.5 mg total) by mouth one time only at 6 PM. Patient not taking: Reported on 12/27/2017 12/23/17   Hillary Bow, MD    No Known Allergies  Family History  Problem Relation Age of Onset  . Lung cancer Father   . Alzheimer's disease Mother   . Stroke Maternal Grandfather     Social History Social History   Tobacco Use  .  Smoking status: Never Smoker  . Smokeless tobacco: Never Used  Substance Use Topics  . Alcohol use: No    Alcohol/week: 0.0 oz    Comment: rarely  . Drug use: No    Review of Systems Constitutional: Negative for fever.  Negative for LOC. Eyes: Negative for visual complaints ENT: Negative for facial injury. Cardiovascular: Negative for chest pain. Respiratory: Negative for shortness of breath. Gastrointestinal: Negative for abdominal pain, vomiting Genitourinary: Negative for dysuria Musculoskeletal: States old bruising to left and right upper extremities.  No acute bruising or pain.  Denies neck or back pain. Skin: Old bruising.  Hematoma to the back of the head Neurological: Mild headache All other ROS negative  ____________________________________________   PHYSICAL EXAM:  VITAL SIGNS: ED Triage Vitals  Enc Vitals Group     BP 12/27/17 1331 133/66     Pulse Rate 12/27/17 1331 65     Resp 12/27/17 1331 18     Temp 12/27/17 1331 97.7 F (36.5 C)     Temp Source 12/27/17 1331 Oral     SpO2 12/27/17 1331 99 %     Weight 12/27/17 1332 271 lb (122.9 kg)     Height 12/27/17 1332 5' 6.5" (1.689 m)     Head Circumference --      Peak  Flow --      Pain Score 12/27/17 1331 0     Pain Loc --      Pain Edu? --      Excl. in [place]? --     Constitutional: Alert and oriented. Well appearing and in no distress. Eyes: Normal exam ENT   Head: Moderate sized occipital scalp hematoma, with moderate tenderness to palpation.  No abrasion or laceration.  Older appearing ecchymosis to right forehead   Nose: Normal appearance   Mouth/Throat: Mucous membranes are moist. Cardiovascular: Normal rate, regular rhythm. Respiratory: Normal respiratory effort without tachypnea nor retractions. Breath sounds are clear  Gastrointestinal: Soft and nontender. No distention.  Obese. Musculoskeletal: Older appearing bruising and hematoma to left upper extremity, older appearing bruising to right upper extremity.  Good range of motion in all extremities, no pain with range of motion of upper or lower extremities. Neurologic:  Normal speech and language. No gross focal neurologic deficits Skin:  Skin is warm, dry.  Bruising as described above. Psychiatric: Mood and affect are normal.   ____________________________________________    EKG  EKG reviewed and interpreted by myself shows normal sinus rhythm at 60 bpm with a slightly widened QRS, left axis deviation, no concerning ST changes.  ____________________________________________    RADIOLOGY  CT scan showed no acute intracranial abnormality.  No C-spine fracture.  ____________________________________________   INITIAL IMPRESSION / ASSESSMENT AND PLAN / ED COURSE  Pertinent labs & imaging results that were available during my care of the patient were reviewed by me and considered in my medical decision making (see chart for details).  Patient presents to the emergency department after a fall at her nursing facility.  Patient has a moderate hematoma to the back of her scalp, older appearing bruising to her right forehead.  Older appearing bruising to the extremities.  States  frequent falls, last fall approximately 1 week ago.  Differential would include contusions, hematoma, ICH.  We will check basic labs, CT scan of the head and C-spine and continue to closely monitor.  Patient's labs do show mild hyperglycemia otherwise largely unchanged from baseline. EKG is reassuring.  CT imaging pending.  CT imaging negative.  Overall patient continues to appear well.  We will discharge the patient home with PCP follow-up.  ____________________________________________   FINAL CLINICAL IMPRESSION(S) / ED DIAGNOSES  Fall Head injury   Harvest Dark, MD 12/27/17 1547

## 2017-12-27 NOTE — ED Notes (Signed)
Spoke with [facility] regarding transport back to facility. No transport available at this time.

## 2017-12-27 NOTE — ED Triage Notes (Signed)
Pt arrived from [facility] via EMS due to a unwitnessed fall and hitting her head.  Pt on Eliquis.  Pt is A&O x4.

## 2017-12-31 ENCOUNTER — Other Ambulatory Visit: Payer: Self-pay | Admitting: *Deleted

## 2017-12-31 DIAGNOSIS — E119 Type 2 diabetes mellitus without complications: Secondary | ICD-10-CM | POA: Diagnosis not present

## 2017-12-31 DIAGNOSIS — I509 Heart failure, unspecified: Secondary | ICD-10-CM | POA: Diagnosis not present

## 2017-12-31 DIAGNOSIS — I10 Essential (primary) hypertension: Secondary | ICD-10-CM | POA: Diagnosis not present

## 2017-12-31 DIAGNOSIS — Z8673 Personal history of transient ischemic attack (TIA), and cerebral infarction without residual deficits: Secondary | ICD-10-CM | POA: Diagnosis not present

## 2017-12-31 NOTE — Patient Outreach (Signed)
[facility]) Care Management  [facility] Social Work  12/31/2017  Megan Obrien Jun 16, 1946 [id number]  Subjective:  Patient is a 61 year od female, currently in rehab at [facility] for rehab following a hospitalization for a stroke. [facility] care management program explained, [facility] consent signed. Patient describes a positive support system, stating  that she lives alone in her own home, however has plans to have her grandson stay with her upon discharge from rehab. Patient further states that she has someone to help clean her home and run errands 2 days a week. Patient feels that rehab has helped, her speech is improving however continues to report right side weakness. Patient verbalized having no community resource needs at this time   Objective:   Encounter Medications:  Outpatient Encounter Medications as of 12/31/2017  Medication Sig  . acetaminophen (TYLENOL) 650 MG CR tablet Take 650 mg by mouth every 8 (eight) hours as needed for pain.   [name] amLODipine (NORVASC) 5 MG tablet Take 1 tablet (5 mg total) by mouth daily.  [name] apixaban (ELIQUIS) 5 MG TABS tablet Take 5 mg by mouth 2 (two) times daily.  [name] atorvastatin (LIPITOR) 40 MG tablet Take 1 tablet (40 mg total) by mouth daily. (Patient taking differently: Take 40 mg by mouth at bedtime. )  . buPROPion (WELLBUTRIN SR) 150 MG 12 hr tablet Take 150 mg by mouth 2 (two) times daily.  . carvedilol (COREG) 12.5 MG tablet Take 18.75 mg by mouth 2 (two) times daily.  . Cholecalciferol (VITAMIN D-3) 5000 units TABS Take 5,000 Units by mouth daily.  [name] enoxaparin (LOVENOX) 150 MG/ML injection Inject 0.82 mLs (125 mg total) into the skin every 12 (twelve) hours. (Patient not taking: Reported on 12/27/2017)  . ferrous sulfate 325 (65 FE) MG tablet Take 325 mg by mouth daily.  [name] FLUoxetine (PROZAC) 40 MG capsule Take 40 mg by mouth daily.  . fluticasone (FLONASE) 50 MCG/ACT nasal spray Place 1 spray into both nostrils daily.  . furosemide  (LASIX) 40 MG tablet Take 40 mg by mouth daily.  [name] glimepiride (AMARYL) 4 MG tablet Take 8 mg by mouth daily.  [name] lisinopril (PRINIVIL,ZESTRIL) 40 MG tablet Take 40 mg by mouth daily.  . Melatonin 10 MG TABS Take 10 mg by mouth at bedtime.  [name] nystatin (MYCOSTATIN/NYSTOP) powder Apply topically 2 (two) times daily. Apply to abdominal folds  . omega-3 acid ethyl esters (LOVAZA) 1 g capsule Take 1 g by mouth daily.  [name] omeprazole (PRILOSEC) 40 MG capsule Take 40 mg by mouth daily.  [name] oxybutynin (DITROPAN-XL) 5 MG 24 hr tablet Take 5 mg by mouth 3 (three) times daily.  [name] triamcinolone cream (KENALOG) 0.1 % Apply 1 application topically 2 (two) times daily.  [name] warfarin (COUMADIN) 7.5 MG tablet Take 1 tablet (7.5 mg total) by mouth one time only at 6 PM. (Patient not taking: Reported on 12/27/2017)   No facility-administered encounter medications on file as of 12/31/2017.     Functional Status:  In your present state of health, do you have any difficulty performing the following activities: 12/18/2017 12/18/2017  Hearing? - N  Vision? - N  Difficulty concentrating or making decisions? - N  Walking or climbing stairs? - Y  Dressing or bathing? - N  Doing errands, shopping? Y -  Some recent data might be hidden    Fall/Depression Screening:  PHQ 2/9 Scores 01/15/2017 09/11/2016 07/24/2016  PHQ - 2 Score 4 0 0  PHQ- 9  Score 13 - -    Assessment: Patient pleasant and engaging. She is looking forward to returning home. This Education officer, museum spoke with the discharge planner Milana Kidney who confirms patient's plan to return home with [facility] There is no discharge date set yet.  Plan: This Education officer, museum will follow up within 2 weeks regarding patient's discharge plan.   [name] [facility] Care Management [phone]

## 2018-01-01 NOTE — Telephone Encounter (Signed)
Unable to contact to schedule [facility] appointment and 4-6 week f/u with Dr. Fletcher Anon. Letter sent.

## 2018-01-07 ENCOUNTER — Other Ambulatory Visit: Payer: Self-pay | Admitting: *Deleted

## 2018-01-07 ENCOUNTER — Ambulatory Visit: Payer: Self-pay | Admitting: *Deleted

## 2018-01-07 NOTE — Patient Outreach (Signed)
[name]) Care Management  01/07/2018  Megan Obrien Mar 12, 1946 [id number]   Phone call to Jason Fila, discharge planner for [name] to discuss patient's progress in rehab. Per [name], patient continues to experience right side weakness and is having difficulty with mobility. There is concern about her ability to complete her ADL's independently upon her return home. There is no discharge date set yet.   Plan: This Education officer, museum will follow up with patient within 2 weeks to discuss care plan for her return home and to determine community resource needs.   [name] [facility] Care Management [phone]

## 2018-01-13 ENCOUNTER — Ambulatory Visit: Payer: Self-pay | Admitting: *Deleted

## 2018-01-13 DIAGNOSIS — I10 Essential (primary) hypertension: Secondary | ICD-10-CM | POA: Diagnosis not present

## 2018-01-13 DIAGNOSIS — E119 Type 2 diabetes mellitus without complications: Secondary | ICD-10-CM | POA: Diagnosis not present

## 2018-01-13 DIAGNOSIS — Z8673 Personal history of transient ischemic attack (TIA), and cerebral infarction without residual deficits: Secondary | ICD-10-CM | POA: Diagnosis not present

## 2018-01-13 DIAGNOSIS — I509 Heart failure, unspecified: Secondary | ICD-10-CM | POA: Diagnosis not present

## 2018-01-14 ENCOUNTER — Other Ambulatory Visit: Payer: Self-pay | Admitting: *Deleted

## 2018-01-14 NOTE — Patient Outreach (Signed)
[facility]) Care Management  01/14/2018  Megan Obrien October 16, 1945 [id number]   This social worker arrived to [facility] to follow up on patient's status in rehab at [facility] to find that patient had to be discharged. Per the discharge planner, patient was in to co-pay days and patient's family requested discharge. Patient discharged with [facility].  Plan: This Education officer, museum will follow up with patient at home .         This social worker will inform RNCM for transition of care.   [name] [facility] Care Management [phone]

## 2018-01-15 ENCOUNTER — Telehealth: Payer: Self-pay | Admitting: Primary Care

## 2018-01-15 NOTE — Telephone Encounter (Signed)
Copied from [place] [phone]. Topic: Quick Communication - See Telephone Encounter >> Jan 15, 2018 10:33 AM Conception Chancy, NT wrote: CRM for notification. See Telephone encounter for: 01/15/18.  [facility] is calling and stating they will be starting home health on 01/17/18. Please advise.  Darlina Guys [phone]

## 2018-01-15 NOTE — Telephone Encounter (Signed)
Noted, she is scheduled for hospital follow up tomorrow.

## 2018-01-16 ENCOUNTER — Ambulatory Visit (INDEPENDENT_AMBULATORY_CARE_PROVIDER_SITE_OTHER): Payer: Medicare Other | Admitting: Primary Care

## 2018-01-16 ENCOUNTER — Other Ambulatory Visit: Payer: Self-pay | Admitting: *Deleted

## 2018-01-16 ENCOUNTER — Encounter: Payer: Self-pay | Admitting: Primary Care

## 2018-01-16 VITALS — BP 144/80 | HR 61 | Temp 97.8°F | Ht 66.5 in | Wt 265.0 lb

## 2018-01-16 DIAGNOSIS — E118 Type 2 diabetes mellitus with unspecified complications: Secondary | ICD-10-CM

## 2018-01-16 DIAGNOSIS — I63429 Cerebral infarction due to embolism of unspecified anterior cerebral artery: Secondary | ICD-10-CM

## 2018-01-16 DIAGNOSIS — I10 Essential (primary) hypertension: Secondary | ICD-10-CM | POA: Diagnosis not present

## 2018-01-16 DIAGNOSIS — E785 Hyperlipidemia, unspecified: Secondary | ICD-10-CM | POA: Diagnosis not present

## 2018-01-16 MED ORDER — INSULIN GLARGINE 100 UNIT/ML SOLOSTAR PEN: 25.0000 [IU] | PEN_INJECTOR | Freq: Every day | SUBCUTANEOUS | 5 refills | Status: DC

## 2018-01-16 NOTE — Patient Instructions (Signed)
Stop glimepiride 4 mg tablets for diabetes.   Continue Lantus 25 units at bedtime. I sent refills to [facility].  Start checking your glucose levels three times daily: before breakfast, 2 hours after lunch, bedtime. Record your readings and bring them to your visit in 2-3 weeks.   Start monitoring your blood pressure daily, around the same time of day, for the next 2-3 weeks.  Ensure that you have rested for 30 minutes prior to checking your blood pressure. Record your readings and bring them to your next visit.  Follow up with Dr. Fletcher Anon as scheduled.  Schedule a follow up visit in 2-3 weeks.  It was a pleasure to see you today!

## 2018-01-16 NOTE — Patient Outreach (Signed)
[facility]) Care Management   01/16/2018   Yanissa Michalsky 02-26-1946 [id number]  Unsuccessful telephone encounter to [name], 72 year old female- follow up on referral received from  [place] on 01/14/18 for Community CM services/transition of care/recent SNF discharge to home  01/14/18.  Pt admitted to rehab after recent hospitalization March 19-25,2019 for Expressive Aphasia, Stroke.  Pt's history includes but not limited to Hypertension, Diabetes, GERD, Hyperlipidemia, HF.    Unable to leave a voice message on pt's phone- kept ringing.  HIPAA compliant voice message left on daughter [name] (on [facility] consent) with RN CM's contact information.   Plan:  Unsuccessful outreach letter to be sent to pt and if no response in 10 business days to close case and  Notify PCP.  RN CM to make second attempt call in 2 business days.    [name].   [facility] Care Management  [phone]

## 2018-01-16 NOTE — Progress Notes (Signed)
Subjective:    Patient ID: Megan Obrien, female    DOB: 1946/03/30, 72 y.o.   MRN: [id number]  HPI  Megan Obrien is a 72 year old female who presents today for hospital follow up.  She presented to [facility] ED on 12/17/16 with a chief complaint of expressive aphasia and dizziness. Her symptoms began in the morning, thought they would pass, then progressed throughout the day. Her work up in the ED was overall unremarkable but she was admitted as her symptoms progressed and there was suspicion for stroke.   She was diagnosed with acute stroke confirmed by MRI which showed several acute and subacute small infarcts bilaterally in multiple regions. This was presumed to be embolic in cause. She underwent CTA without large vessel occlusion but severe bilateral MCA and PCA branch stenoses. Left carotid with 60% stenosis and right carotid with 45%.    Her A1C was noted to be at 11.5% so she was initiated on Lantus. Her LDL was 145 so she was initiated on atorvastatin 40 mg. Amlodipine 5 mg was added and lisinopril was increased to 40 mg during her visit.   Echo and TEE showed normal EF, severe calcification to mitral valve, dilated right and left atrium.  She was suspected to have had paroxysmal atrial fibrillation which caused small atrial thrombus to left atrium so she was initiated on Coumadin with Lovenox bridging. Goal INR of 2-3. Transitioned to Eliquis in outpatient setting.   She was discharged to [facility] on 12/23/17. She was discharged from [facility] on April 15th.   She was called on April 15th by home health and will be visited tomorrow. LDL goal is <70, A1C goal is <7.0 per hospital records. She denies dizziness, unilateral weakness, visual changes. She continues to struggle with expressive aphasia but this is improving. She is not checking her blood sugars at home, is compliant to Lantus 25 units and Glipepiride 8 mg. She would like to stop taking glimepiride. She has an  appointment scheduled with her cardiologist in May.  BP Readings from Last 3 Encounters:  01/16/18 (!) 144/80  12/27/17 (!) 197/107  12/23/17 (!) 127/51      Review of Systems  Constitutional: Negative for fatigue.  Respiratory: Negative for shortness of breath.   Cardiovascular: Negative for chest pain and palpitations.  Neurological: Negative for dizziness.       Expressive aphasia        Past Medical History:  Diagnosis Date  . (HFpEF) heart failure with preserved ejection fraction ([place])    a. 03/2017 Echo: EF 55-60%, no rwma, Gr1 DD, mild to mod MS, mod to sev dil LA.  [name] Arthritis   . Carotid arterial disease ([place])    a. 11/2017 CTA Head/Neck: RICA 45, LICA 60.  [name] Cerebrovascular disease    a. 11/2017 MRA: mod to sev stenosis of bilat PCA's P2 segments; b. 11/2017 CTA Head/Neck: RICA 45, LICA 60, sev bilat distal MCA branch stenoses w/ sev bilat P2 stenoses.  . CKD (chronic kidney disease), stage III ([place])   . Depression   . GERD (gastroesophageal reflux disease)   . Hyperlipidemia   . Hypertension   . Left Atrial Appendage Thrombus    a. 11/2017 TEE (performed in setting of stroke/aphasia): nl EF, mod MS, dil LA w/ heavy smoke and small LAA thrombus-->Warfarin initiated.  . Mitral stenosis    a. Mild to moderate by echo 03/2017; b. 03/2017 R/LHC: PCWP 50mmHg, LVEDP 51mmHg. Mean grad of  29mmHg. Valve area of 2.02cm^2; c. 11/2017 TEE: Mod MS.  . Morbid obesity ([place])   . Murmur   . Non-obstructive CAD (coronary artery disease)    a. 03/2017 Cath: mild, non-obstructive CAD.  [name] OSA (obstructive sleep apnea)    a. Previously wore CPAP for several years but stopped on her own and now just uses O2 via [place] @ night.  [name] PAH (pulmonary artery hypertension) ([place])    a. 03/2017 RHC: PA 34mmHg.  . Seasonal allergies   . Stroke [facility])    a. 11/2017 - presented w/ aphasia->MRI acute to early subacute ischemia w/in multiple vascular territories, largest in R frontal & L parietal lobes. Smaller  foci of ischemia w/in R hippocampus, R parietal lobe, and L peripheral postcentral gyrus.  . Type 2 diabetes mellitus ([place])   . Urinary incontinence      Social History   Socioeconomic History  . Marital status: Widowed    Spouse name: Not on file  . Number of children: Not on file  . Years of education: Not on file  . Highest education level: Not on file  Occupational History  . Not on file  Social Needs  . Financial resource strain: Not hard at all  . Food insecurity:    Worry: Patient refused    Inability: Patient refused  . Transportation needs:    Medical: Patient refused    Non-medical: Patient refused  Tobacco Use  . Smoking status: Never Smoker  . Smokeless tobacco: Never Used  Substance and Sexual Activity  . Alcohol use: No    Alcohol/week: 0.0 oz    Comment: rarely  . Drug use: No  . Sexual activity: Never  Lifestyle  . Physical activity:    Days per week: Patient refused    Minutes per session: Patient refused  . Stress: Only a little  Relationships  . Social connections:    Talks on phone: Patient refused    Gets together: Patient refused    Attends religious service: Patient refused    Active member of club or organization: Patient refused    Attends meetings of clubs or organizations: Patient refused    Relationship status: Patient refused  . Intimate partner violence:    Fear of current or ex partner: Patient refused    Emotionally abused: Patient refused    Physically abused: Patient refused    Forced sexual activity: Patient refused  Other Topics Concern  . Not on file  Social History Narrative   Widow.   2 children, numerous grandchildren.   Retired. Once worked on a Publishing copy.   Enjoys playing computer games, spending time with family.    Past Surgical History:  Procedure Laterality Date  . BACK SURGERY    . CARDIAC CATHETERIZATION    . KNEE SURGERY Left   . RIGHT/LEFT HEART CATH AND CORONARY ANGIOGRAPHY Bilateral 04/25/2017    Procedure: Right/Left Heart Cath and Coronary Angiography;  Surgeon: Wellington Hampshire, MD;  Location: [facility];  Service: Cardiovascular;  Laterality: Bilateral;  . TEE WITHOUT CARDIOVERSION N/A 12/20/2017   Procedure: TRANSESOPHAGEAL ECHOCARDIOGRAM (TEE);  Surgeon: Wellington Hampshire, MD;  Location: [facility] ORS;  Service: Cardiovascular;  Laterality: N/A;  . TONSILLECTOMY AND ADENOIDECTOMY  1951    Family History  Problem Relation Age of Onset  . Lung cancer Father   . Alzheimer's disease Mother   . Stroke Maternal Grandfather     No Known Allergies  Current Outpatient Medications on File Prior to  Visit  Medication Sig Dispense Refill  . acetaminophen (TYLENOL) 650 MG CR tablet Take 650 mg by mouth every 8 (eight) hours as needed for pain.     [name] amLODipine (NORVASC) 5 MG tablet Take 1 tablet (5 mg total) by mouth daily.    [name] apixaban (ELIQUIS) 5 MG TABS tablet Take 5 mg by mouth 2 (two) times daily.    [name] atorvastatin (LIPITOR) 40 MG tablet Take 1 tablet (40 mg total) by mouth daily. (Patient taking differently: Take 40 mg by mouth at bedtime. ) 90 tablet 1  . buPROPion (WELLBUTRIN SR) 150 MG 12 hr tablet Take 150 mg by mouth 2 (two) times daily.    . carvedilol (COREG) 12.5 MG tablet Take 18.75 mg by mouth 2 (two) times daily.    . Cholecalciferol (VITAMIN D-3) 5000 units TABS Take 5,000 Units by mouth daily.    . ferrous sulfate 325 (65 FE) MG tablet Take 325 mg by mouth daily.    [name] FLUoxetine (PROZAC) 40 MG capsule Take 40 mg by mouth daily.    . fluticasone (FLONASE) 50 MCG/ACT nasal spray Place 1 spray into both nostrils daily. 48 g 1  . furosemide (LASIX) 40 MG tablet Take 40 mg by mouth daily.    [name] lisinopril (PRINIVIL,ZESTRIL) 40 MG tablet Take 40 mg by mouth daily.    . Melatonin 10 MG TABS Take 10 mg by mouth at bedtime.    [name] omega-3 acid ethyl esters (LOVAZA) 1 g capsule Take 1 g by mouth daily.    [name] omeprazole (PRILOSEC) 40 MG capsule Take 40 mg by mouth daily.      [name] oxybutynin (DITROPAN-XL) 5 MG 24 hr tablet Take 5 mg by mouth 3 (three) times daily.    [name] triamcinolone cream (KENALOG) 0.1 % Apply 1 application topically 2 (two) times daily. 30 g 0   No current facility-administered medications on file prior to visit.     BP (!) 144/80   Pulse 61   Temp 97.8 F (36.6 C) (Oral)   Ht 5' 6.5" (1.689 m)   Wt 265 lb (120.2 kg)   SpO2 94%   BMI 42.13 kg/m    Objective:   Physical Exam  Constitutional: She is oriented to person, place, and time. She appears well-nourished.  Neck: Neck supple.  Cardiovascular: Normal rate.  Pulmonary/Chest: Effort normal and breath sounds normal.  Neurological: She is alert and oriented to person, place, and time. No cranial nerve deficit.  Expressive aphasia noted intermittently during exam. Grips equal bilaterally, no facial drooping or arm drift.  Skin: Skin is warm and dry.  Psychiatric: She has a normal mood and affect.          Assessment & Plan:

## 2018-01-17 ENCOUNTER — Telehealth: Payer: Self-pay | Admitting: Primary Care

## 2018-01-17 DIAGNOSIS — E1122 Type 2 diabetes mellitus with diabetic chronic kidney disease: Secondary | ICD-10-CM | POA: Diagnosis not present

## 2018-01-17 DIAGNOSIS — Z794 Long term (current) use of insulin: Secondary | ICD-10-CM | POA: Diagnosis not present

## 2018-01-17 DIAGNOSIS — Z7901 Long term (current) use of anticoagulants: Secondary | ICD-10-CM | POA: Diagnosis not present

## 2018-01-17 DIAGNOSIS — I2510 Atherosclerotic heart disease of native coronary artery without angina pectoris: Secondary | ICD-10-CM | POA: Diagnosis not present

## 2018-01-17 DIAGNOSIS — I2721 Secondary pulmonary arterial hypertension: Secondary | ICD-10-CM | POA: Diagnosis not present

## 2018-01-17 DIAGNOSIS — I69320 Aphasia following cerebral infarction: Secondary | ICD-10-CM | POA: Diagnosis not present

## 2018-01-17 DIAGNOSIS — M1991 Primary osteoarthritis, unspecified site: Secondary | ICD-10-CM | POA: Diagnosis not present

## 2018-01-17 DIAGNOSIS — N183 Chronic kidney disease, stage 3 (moderate): Secondary | ICD-10-CM | POA: Diagnosis not present

## 2018-01-17 DIAGNOSIS — I050 Rheumatic mitral stenosis: Secondary | ICD-10-CM | POA: Diagnosis not present

## 2018-01-17 DIAGNOSIS — I5030 Unspecified diastolic (congestive) heart failure: Secondary | ICD-10-CM | POA: Diagnosis not present

## 2018-01-17 DIAGNOSIS — I69393 Ataxia following cerebral infarction: Secondary | ICD-10-CM | POA: Diagnosis not present

## 2018-01-17 DIAGNOSIS — I130 Hypertensive heart and chronic kidney disease with heart failure and stage 1 through stage 4 chronic kidney disease, or unspecified chronic kidney disease: Secondary | ICD-10-CM | POA: Diagnosis not present

## 2018-01-17 NOTE — Assessment & Plan Note (Signed)
Never followed up in October 2018 as recommended. Recent A1C of 11.5, goal is <7.0.  Discussed to start monitoring blood sugars TID. Continue Lantus 25 units HS for now. She will bring glucose logs to her follow up visit in 2-3 weeks.   Continue ACE and statin.

## 2018-01-17 NOTE — Assessment & Plan Note (Signed)
Likely secondary to atrial thrombus to left atrium per paroxysmal atrial fibrillation.  Continue Coreg, ACE, BP control, Eliquis, atorvastatin. Follow up with cardiology as scheduled. Home PT/OT scheduled to come out next week.  Exam today overall stable. All hospital notes, imaging, labs reviewed. Follow up in 2 weeks for diabetes check.

## 2018-01-17 NOTE — Assessment & Plan Note (Signed)
LDL goal of <70 in the setting of recent stroke. Continue atorvastatin 40 mg. Repeat lipids in 6 weeks.

## 2018-01-17 NOTE — Assessment & Plan Note (Signed)
Will have her start monitoring home readings. Slightly above goal today, but will closely monitor. Continue Coreg, Lisinopril, Amlodipine.

## 2018-01-17 NOTE — Telephone Encounter (Signed)
Copied from [place] [phone]. Topic: Quick Communication - See Telephone Encounter >> Jan 17, 2018 12:08 PM Conception Chancy, NT wrote: CRM for notification. See Telephone encounter for: 01/17/18.  Pam is calling from [facility] and would like to receive orders for skilled nursing with medication management. 1x a week for 1week , 2x a week for 5 week and 1 week 3 and then 2 PRN visits.   Pam Cb# [phone]

## 2018-01-20 ENCOUNTER — Other Ambulatory Visit: Payer: Self-pay | Admitting: *Deleted

## 2018-01-20 DIAGNOSIS — M1991 Primary osteoarthritis, unspecified site: Secondary | ICD-10-CM | POA: Diagnosis not present

## 2018-01-20 DIAGNOSIS — E1122 Type 2 diabetes mellitus with diabetic chronic kidney disease: Secondary | ICD-10-CM | POA: Diagnosis not present

## 2018-01-20 DIAGNOSIS — I130 Hypertensive heart and chronic kidney disease with heart failure and stage 1 through stage 4 chronic kidney disease, or unspecified chronic kidney disease: Secondary | ICD-10-CM | POA: Diagnosis not present

## 2018-01-20 DIAGNOSIS — Z7901 Long term (current) use of anticoagulants: Secondary | ICD-10-CM | POA: Diagnosis not present

## 2018-01-20 DIAGNOSIS — I2721 Secondary pulmonary arterial hypertension: Secondary | ICD-10-CM | POA: Diagnosis not present

## 2018-01-20 DIAGNOSIS — I5030 Unspecified diastolic (congestive) heart failure: Secondary | ICD-10-CM | POA: Diagnosis not present

## 2018-01-20 DIAGNOSIS — I69393 Ataxia following cerebral infarction: Secondary | ICD-10-CM | POA: Diagnosis not present

## 2018-01-20 DIAGNOSIS — N183 Chronic kidney disease, stage 3 (moderate): Secondary | ICD-10-CM | POA: Diagnosis not present

## 2018-01-20 DIAGNOSIS — I050 Rheumatic mitral stenosis: Secondary | ICD-10-CM | POA: Diagnosis not present

## 2018-01-20 DIAGNOSIS — Z794 Long term (current) use of insulin: Secondary | ICD-10-CM | POA: Diagnosis not present

## 2018-01-20 DIAGNOSIS — I2510 Atherosclerotic heart disease of native coronary artery without angina pectoris: Secondary | ICD-10-CM | POA: Diagnosis not present

## 2018-01-20 DIAGNOSIS — I69320 Aphasia following cerebral infarction: Secondary | ICD-10-CM | POA: Diagnosis not present

## 2018-01-20 NOTE — Telephone Encounter (Signed)
Approved.  

## 2018-01-20 NOTE — Patient Outreach (Signed)
[facility]) Care Management  01/20/2018  Megan Obrien [birth year] [id number]  Transition of care call:    Successful telephone encounter to Megan Obrien, 72 year old female - Follow up on referral received from [place]  01/14/18 for Community CM services/transition of care/recent SNF discharge home 01/14/18.  Pt admitted to SNF after recent hospitalization March 19-25,2019 For Expressive Aphasia, Stroke.  Pt's history includes but not limited to  Hypertension, Diabetes, GERD, Hyperlipidemia, HF.   Spoke with pt, HIPAA identifiers verified- discussed purpose of call- follow  Up on referral.  Pt reports on recent hospitalization- was told had a mild Stroke- effected her speech.  Pt reports she lives alone but has a woman Who comes twice a week 7 hours each day to help her.   Pt reports  Prior to hospitalization fell a lot, ambulates with a walker taking it slow, has  [place] services.     Pt reports sugars were high in SNF and since discharge home, today  Sugar was 309.  Pt reports PCP is aware as she followed up with MD Last week.  Pt reports she manages her own medications, taking them as  Ordered, unable to review with RN CM at this time.   Pt reports uses oxygen at night (3 liters [place]), helps her sleep/breath better,  Does not use during the day.  Pt reports to see Heart MD 5/14.  RN CM discussed with pt Transition of care program- provide weekly calls,  A home visit - benefit of her insurance/no cost.    Plan:  As discussed with pt, plan to follow up again next week telephonically,     And the following week - do initial home visit.           Plan to inform PCP of [facility] involvement- send barrier letter.   Megan Obrien.   [place] [facility]  [phone]

## 2018-01-21 DIAGNOSIS — M1991 Primary osteoarthritis, unspecified site: Secondary | ICD-10-CM | POA: Diagnosis not present

## 2018-01-21 DIAGNOSIS — E1122 Type 2 diabetes mellitus with diabetic chronic kidney disease: Secondary | ICD-10-CM | POA: Diagnosis not present

## 2018-01-21 DIAGNOSIS — I69393 Ataxia following cerebral infarction: Secondary | ICD-10-CM | POA: Diagnosis not present

## 2018-01-21 DIAGNOSIS — I130 Hypertensive heart and chronic kidney disease with heart failure and stage 1 through stage 4 chronic kidney disease, or unspecified chronic kidney disease: Secondary | ICD-10-CM | POA: Diagnosis not present

## 2018-01-21 DIAGNOSIS — I2721 Secondary pulmonary arterial hypertension: Secondary | ICD-10-CM | POA: Diagnosis not present

## 2018-01-21 DIAGNOSIS — I5030 Unspecified diastolic (congestive) heart failure: Secondary | ICD-10-CM | POA: Diagnosis not present

## 2018-01-21 DIAGNOSIS — I69320 Aphasia following cerebral infarction: Secondary | ICD-10-CM | POA: Diagnosis not present

## 2018-01-21 DIAGNOSIS — N183 Chronic kidney disease, stage 3 (moderate): Secondary | ICD-10-CM | POA: Diagnosis not present

## 2018-01-21 DIAGNOSIS — I2510 Atherosclerotic heart disease of native coronary artery without angina pectoris: Secondary | ICD-10-CM | POA: Diagnosis not present

## 2018-01-21 DIAGNOSIS — I050 Rheumatic mitral stenosis: Secondary | ICD-10-CM | POA: Diagnosis not present

## 2018-01-21 DIAGNOSIS — Z7901 Long term (current) use of anticoagulants: Secondary | ICD-10-CM | POA: Diagnosis not present

## 2018-01-21 DIAGNOSIS — Z794 Long term (current) use of insulin: Secondary | ICD-10-CM | POA: Diagnosis not present

## 2018-01-21 NOTE — Telephone Encounter (Signed)
Spoken to [place] from [place] at [facility] and gave her the approval of the verbal orders.

## 2018-01-22 ENCOUNTER — Other Ambulatory Visit: Payer: Self-pay | Admitting: *Deleted

## 2018-01-22 DIAGNOSIS — I5030 Unspecified diastolic (congestive) heart failure: Secondary | ICD-10-CM | POA: Diagnosis not present

## 2018-01-22 DIAGNOSIS — M1991 Primary osteoarthritis, unspecified site: Secondary | ICD-10-CM | POA: Diagnosis not present

## 2018-01-22 DIAGNOSIS — Z794 Long term (current) use of insulin: Secondary | ICD-10-CM | POA: Diagnosis not present

## 2018-01-22 DIAGNOSIS — E1122 Type 2 diabetes mellitus with diabetic chronic kidney disease: Secondary | ICD-10-CM | POA: Diagnosis not present

## 2018-01-22 DIAGNOSIS — I69393 Ataxia following cerebral infarction: Secondary | ICD-10-CM | POA: Diagnosis not present

## 2018-01-22 DIAGNOSIS — I2510 Atherosclerotic heart disease of native coronary artery without angina pectoris: Secondary | ICD-10-CM | POA: Diagnosis not present

## 2018-01-22 DIAGNOSIS — Z7901 Long term (current) use of anticoagulants: Secondary | ICD-10-CM | POA: Diagnosis not present

## 2018-01-22 DIAGNOSIS — I130 Hypertensive heart and chronic kidney disease with heart failure and stage 1 through stage 4 chronic kidney disease, or unspecified chronic kidney disease: Secondary | ICD-10-CM | POA: Diagnosis not present

## 2018-01-22 DIAGNOSIS — N183 Chronic kidney disease, stage 3 (moderate): Secondary | ICD-10-CM | POA: Diagnosis not present

## 2018-01-22 DIAGNOSIS — I050 Rheumatic mitral stenosis: Secondary | ICD-10-CM | POA: Diagnosis not present

## 2018-01-22 DIAGNOSIS — I2721 Secondary pulmonary arterial hypertension: Secondary | ICD-10-CM | POA: Diagnosis not present

## 2018-01-22 DIAGNOSIS — I69320 Aphasia following cerebral infarction: Secondary | ICD-10-CM | POA: Diagnosis not present

## 2018-01-22 NOTE — Patient Outreach (Signed)
[facility]) Care Management  01/22/2018  Megan Obrien 29-Jul-1946 [id number]    Phone call to patient to assess for community resource needs post discharge from the SNF. Per patient, she has a new walker and  bedside rail that helps stabilize her when she is transferfing in and out of bed. Patient states that she,resides alone, however describes a postivie support group of family and friends. Patient's   daughter visits 2-3 times per week and she has an aid  Come in  twice a week to clean the home and help her to run errands. Patient receiving HH, speech has started, PT and OT should start this week. Patient denies having any community resource needs.at this time.   Plan: patient to be closed to social work at this time.   Megan Obrien [facility] Care Management [phone]

## 2018-01-23 DIAGNOSIS — M1991 Primary osteoarthritis, unspecified site: Secondary | ICD-10-CM | POA: Diagnosis not present

## 2018-01-23 DIAGNOSIS — E1122 Type 2 diabetes mellitus with diabetic chronic kidney disease: Secondary | ICD-10-CM | POA: Diagnosis not present

## 2018-01-23 DIAGNOSIS — I5030 Unspecified diastolic (congestive) heart failure: Secondary | ICD-10-CM | POA: Diagnosis not present

## 2018-01-23 DIAGNOSIS — I69320 Aphasia following cerebral infarction: Secondary | ICD-10-CM | POA: Diagnosis not present

## 2018-01-23 DIAGNOSIS — Z794 Long term (current) use of insulin: Secondary | ICD-10-CM | POA: Diagnosis not present

## 2018-01-23 DIAGNOSIS — N183 Chronic kidney disease, stage 3 (moderate): Secondary | ICD-10-CM | POA: Diagnosis not present

## 2018-01-23 DIAGNOSIS — Z7901 Long term (current) use of anticoagulants: Secondary | ICD-10-CM | POA: Diagnosis not present

## 2018-01-23 DIAGNOSIS — I2721 Secondary pulmonary arterial hypertension: Secondary | ICD-10-CM | POA: Diagnosis not present

## 2018-01-23 DIAGNOSIS — I050 Rheumatic mitral stenosis: Secondary | ICD-10-CM | POA: Diagnosis not present

## 2018-01-23 DIAGNOSIS — I130 Hypertensive heart and chronic kidney disease with heart failure and stage 1 through stage 4 chronic kidney disease, or unspecified chronic kidney disease: Secondary | ICD-10-CM | POA: Diagnosis not present

## 2018-01-23 DIAGNOSIS — I69393 Ataxia following cerebral infarction: Secondary | ICD-10-CM | POA: Diagnosis not present

## 2018-01-23 DIAGNOSIS — I2510 Atherosclerotic heart disease of native coronary artery without angina pectoris: Secondary | ICD-10-CM | POA: Diagnosis not present

## 2018-01-24 DIAGNOSIS — I69393 Ataxia following cerebral infarction: Secondary | ICD-10-CM | POA: Diagnosis not present

## 2018-01-24 DIAGNOSIS — Z794 Long term (current) use of insulin: Secondary | ICD-10-CM | POA: Diagnosis not present

## 2018-01-24 DIAGNOSIS — I5030 Unspecified diastolic (congestive) heart failure: Secondary | ICD-10-CM | POA: Diagnosis not present

## 2018-01-24 DIAGNOSIS — I69320 Aphasia following cerebral infarction: Secondary | ICD-10-CM | POA: Diagnosis not present

## 2018-01-24 DIAGNOSIS — N183 Chronic kidney disease, stage 3 (moderate): Secondary | ICD-10-CM | POA: Diagnosis not present

## 2018-01-24 DIAGNOSIS — Z7901 Long term (current) use of anticoagulants: Secondary | ICD-10-CM | POA: Diagnosis not present

## 2018-01-24 DIAGNOSIS — E1122 Type 2 diabetes mellitus with diabetic chronic kidney disease: Secondary | ICD-10-CM | POA: Diagnosis not present

## 2018-01-24 DIAGNOSIS — I2510 Atherosclerotic heart disease of native coronary artery without angina pectoris: Secondary | ICD-10-CM | POA: Diagnosis not present

## 2018-01-24 DIAGNOSIS — M1991 Primary osteoarthritis, unspecified site: Secondary | ICD-10-CM | POA: Diagnosis not present

## 2018-01-24 DIAGNOSIS — I2721 Secondary pulmonary arterial hypertension: Secondary | ICD-10-CM | POA: Diagnosis not present

## 2018-01-24 DIAGNOSIS — I130 Hypertensive heart and chronic kidney disease with heart failure and stage 1 through stage 4 chronic kidney disease, or unspecified chronic kidney disease: Secondary | ICD-10-CM | POA: Diagnosis not present

## 2018-01-24 DIAGNOSIS — I050 Rheumatic mitral stenosis: Secondary | ICD-10-CM | POA: Diagnosis not present

## 2018-01-27 DIAGNOSIS — I5030 Unspecified diastolic (congestive) heart failure: Secondary | ICD-10-CM | POA: Diagnosis not present

## 2018-01-27 DIAGNOSIS — I69393 Ataxia following cerebral infarction: Secondary | ICD-10-CM | POA: Diagnosis not present

## 2018-01-27 DIAGNOSIS — E1122 Type 2 diabetes mellitus with diabetic chronic kidney disease: Secondary | ICD-10-CM | POA: Diagnosis not present

## 2018-01-27 DIAGNOSIS — I69320 Aphasia following cerebral infarction: Secondary | ICD-10-CM | POA: Diagnosis not present

## 2018-01-27 DIAGNOSIS — I2721 Secondary pulmonary arterial hypertension: Secondary | ICD-10-CM | POA: Diagnosis not present

## 2018-01-27 DIAGNOSIS — N183 Chronic kidney disease, stage 3 (moderate): Secondary | ICD-10-CM | POA: Diagnosis not present

## 2018-01-27 DIAGNOSIS — I050 Rheumatic mitral stenosis: Secondary | ICD-10-CM | POA: Diagnosis not present

## 2018-01-27 DIAGNOSIS — I2510 Atherosclerotic heart disease of native coronary artery without angina pectoris: Secondary | ICD-10-CM | POA: Diagnosis not present

## 2018-01-27 DIAGNOSIS — Z794 Long term (current) use of insulin: Secondary | ICD-10-CM | POA: Diagnosis not present

## 2018-01-27 DIAGNOSIS — Z7901 Long term (current) use of anticoagulants: Secondary | ICD-10-CM | POA: Diagnosis not present

## 2018-01-27 DIAGNOSIS — M1991 Primary osteoarthritis, unspecified site: Secondary | ICD-10-CM | POA: Diagnosis not present

## 2018-01-27 DIAGNOSIS — I130 Hypertensive heart and chronic kidney disease with heart failure and stage 1 through stage 4 chronic kidney disease, or unspecified chronic kidney disease: Secondary | ICD-10-CM | POA: Diagnosis not present

## 2018-01-28 ENCOUNTER — Encounter: Payer: Self-pay | Admitting: *Deleted

## 2018-01-28 ENCOUNTER — Other Ambulatory Visit: Payer: Self-pay | Admitting: *Deleted

## 2018-01-28 NOTE — Patient Outreach (Addendum)
[facility]) Care Management   01/28/2018  Megan Obrien Apr 20, 1946 [id number]  Megan Obrien is an 72 y.o. female  Subjective: Pt reports speech still effected by recent stroke, cannot talk long. Pt   reports continues with chronic pain in knees, both are  bone on bone, HH PT  working with her, use of walker when ambulating.   Pt reports  Her sugars have been elevated, to follow up with PCP ([place] in EMR 5/9) soon, told to  Monitor sugars/record/bring to office visit.  Pt reports sugar this am was 359, took 25 units  Of Lantus insulin last night, noticed some of the medication leaked from injection site so Gave herself another 20 units of insulin.     Objective:   Vitals:   01/28/18 1043  BP: (!) 160/80  Pulse: 78  Resp: 16  SpO2: 98%    ROS  Physical Exam  Constitutional: She is oriented to person, place, and time. She appears well-developed and well-nourished.  Cardiovascular: Normal rate, regular rhythm and normal heart sounds.  GI: Soft.  Musculoskeletal: Normal range of motion.  Use of walker when ambulating.   Neurological: She is alert and oriented to person, place, and time.  Skin: Skin is warm and dry.  Psychiatric: She has a normal mood and affect. Her behavior is normal. Judgment and thought content normal.    Encounter Medications:   Outpatient Encounter Medications as of 01/28/2018  Medication Sig  . acetaminophen (TYLENOL) 650 MG CR tablet Take 650 mg by mouth every 8 (eight) hours as needed for pain.   [name] amLODipine (NORVASC) 5 MG tablet Take 1 tablet (5 mg total) by mouth daily.  [name] apixaban (ELIQUIS) 5 MG TABS tablet Take 5 mg by mouth 2 (two) times daily.  [name] atorvastatin (LIPITOR) 40 MG tablet Take 1 tablet (40 mg total) by mouth daily. (Patient taking differently: Take 40 mg by mouth at bedtime. )  . buPROPion (WELLBUTRIN SR) 150 MG 12 hr tablet Take 150 mg by mouth 2 (two) times daily.  . carvedilol (COREG) 12.5 MG tablet Take  18.75 mg by mouth 2 (two) times daily.  . Cholecalciferol (VITAMIN D-3) 5000 units TABS Take 5,000 Units by mouth daily.  . ferrous sulfate 325 (65 FE) MG tablet Take 325 mg by mouth daily.  [name] FLUoxetine (PROZAC) 40 MG capsule Take 40 mg by mouth daily.  . fluticasone (FLONASE) 50 MCG/ACT nasal spray Place 1 spray into both nostrils daily.  . furosemide (LASIX) 40 MG tablet Take 40 mg by mouth daily.  . Insulin Glargine (LANTUS) 100 UNIT/ML Solostar Pen Inject 25 Units into the skin daily at 10 pm.  . lisinopril (PRINIVIL,ZESTRIL) 40 MG tablet Take 40 mg by mouth daily.  . Melatonin 10 MG TABS Take 10 mg by mouth at bedtime.  [name] omega-3 acid ethyl esters (LOVAZA) 1 g capsule Take 1 g by mouth daily.  [name] omeprazole (PRILOSEC) 40 MG capsule Take 40 mg by mouth daily.  [name] oxybutynin (DITROPAN-XL) 5 MG 24 hr tablet Take 5 mg by mouth 3 (three) times daily.  [name] triamcinolone cream (KENALOG) 0.1 % Apply 1 application topically 2 (two) times daily.   No facility-administered encounter medications on file as of 01/28/2018.     Functional Status:   In your present state of health, do you have any difficulty performing the following activities: 01/28/2018 12/18/2017  Hearing? Y -  Comment left ear  -  Vision? N -  Difficulty concentrating  or making decisions? N -  Walking or climbing stairs? Y -  Dressing or bathing? N -  Doing errands, shopping? [name]  Preparing Food and eating ? Y -  Using the Toilet? N -  In the past six months, have you accidently leaked urine? Y -  Do you have problems with loss of bowel control? N -  Managing your Medications? N -  Managing your Finances? Y -  Housekeeping or managing your Housekeeping? Y -  Some recent data might be hidden    Fall/Depression Screening:    Fall Risk  01/20/2018 09/11/2016  Falls in the past year? Yes No  Number falls in past yr: 2 or more -  Injury with Fall? Yes -  Risk for fall due to : Impaired balance/gait -   PHQ 2/9 Scores  01/20/2018 01/15/2017 09/11/2016 07/24/2016  PHQ - 2 Score 0 4 0 0  PHQ- 9 Score - 13 - -    Assessment:  Pleasant 72 year old female, lives alone, daughter close by checks on pt often, provides Meals twice a week, has someone who cleans her house/provides transportation if needed.  RN CM following pt for transition of care/recent SNF discharge 01/14/18, admitted to rehab after recent Hospitalization March 19-25,2019 fro Expressive aphasia, stroke.  History includes by not limited to Hyperlipidemia, GERD, depression, Urge incontinence, Type 2 diabetes.     Recent stroke/expressive aphasia:  Noted pt having difficulty at times expressing what she wants to     Say.    Hypertension:  BP today 160/80 (prior to taking am medications)   Diabetes:  Pt checked sugar during home visit- result 359.  Demonstrated to pt correct technique for       Injecting insulin to prevent leakage from injection site.  Discussed with pt not to skip meals, portion       Control.    Medications: Medication review completed with pt with most recent medication list from recent  PCP      Visit.  Discussed with pt danger of giving herself extra insulin- hypoglycemia.    Fall risk:  Provided pt information on safety in the home, ongoing use of walker and Medic Alert.   Plan:  As discussed with pt, continue to check sugars/record/bring results to upcoming PCP visit 5/9.           As discussed with pt, to follow up again next week telephonically- part of ongoing transition of              Care.            RN CM to send [name] NP 01/28/18 initial home visit encounter.    [facility] CM Care Plan Problem One     Most Recent Value  Care Plan Problem One  Risk for readmission related to recent SNF discharge/recent hospitalization for stroke   Role Documenting the Problem One  Care Management Coordinator  Care Plan for Problem One  Active  [facility] Long Term Goal   Pt would not readmit to SNF or hospital within the next 31 days    [facility] Long Term Goal Start Date  01/20/18  Interventions for Problem One Long Term Goal  Initial home visit/assessment done, information on diabetic management provided, working with [facility] PT recovering from recent stroke.   [facility] CM Short Term Goal #1   Pt would take all medications as ordered for the next 30 days   [facility] CM Short Term Goal #1 Start Date  01/20/18  Interventions for Short Term Goal #1  Medication review completed with pt, discussed importance of adherence   [facility] CM Short Term Goal #2   Pt would keep all MD appointments in the next 30 days   [facility] CM Short Term Goal #2 Start Date  01/20/18  Interventions for Short Term Goal #2  discussed with recent visit with PCP, to follow up again 5/9, see Heart MD 5/14.     [facility] CM Care Plan Problem Two     Most Recent Value  Care Plan Problem Two  Diabetes- elevated sugars   Role Documenting the Problem Two  Care Management Coordinator  Care Plan for Problem Two  Active  [facility] CM Short Term Goal #1   Pt would see a decrease in sugars in the next 30 days   [facility] CM Short Term Goal #1 Start Date  01/28/18  Interventions for Short Term Goal #2   Discussed with pt importance of not skipping meals, ongoing medication adherence, proper technique for injecting insulin      [name].   [place] Care Management  [phone]

## 2018-01-29 DIAGNOSIS — I2721 Secondary pulmonary arterial hypertension: Secondary | ICD-10-CM | POA: Diagnosis not present

## 2018-01-29 DIAGNOSIS — I5030 Unspecified diastolic (congestive) heart failure: Secondary | ICD-10-CM | POA: Diagnosis not present

## 2018-01-29 DIAGNOSIS — M1991 Primary osteoarthritis, unspecified site: Secondary | ICD-10-CM | POA: Diagnosis not present

## 2018-01-29 DIAGNOSIS — Z794 Long term (current) use of insulin: Secondary | ICD-10-CM | POA: Diagnosis not present

## 2018-01-29 DIAGNOSIS — I69393 Ataxia following cerebral infarction: Secondary | ICD-10-CM | POA: Diagnosis not present

## 2018-01-29 DIAGNOSIS — I130 Hypertensive heart and chronic kidney disease with heart failure and stage 1 through stage 4 chronic kidney disease, or unspecified chronic kidney disease: Secondary | ICD-10-CM | POA: Diagnosis not present

## 2018-01-29 DIAGNOSIS — I69320 Aphasia following cerebral infarction: Secondary | ICD-10-CM | POA: Diagnosis not present

## 2018-01-29 DIAGNOSIS — Z7901 Long term (current) use of anticoagulants: Secondary | ICD-10-CM | POA: Diagnosis not present

## 2018-01-29 DIAGNOSIS — N183 Chronic kidney disease, stage 3 (moderate): Secondary | ICD-10-CM | POA: Diagnosis not present

## 2018-01-29 DIAGNOSIS — E1122 Type 2 diabetes mellitus with diabetic chronic kidney disease: Secondary | ICD-10-CM | POA: Diagnosis not present

## 2018-01-29 DIAGNOSIS — I050 Rheumatic mitral stenosis: Secondary | ICD-10-CM | POA: Diagnosis not present

## 2018-01-29 DIAGNOSIS — I2510 Atherosclerotic heart disease of native coronary artery without angina pectoris: Secondary | ICD-10-CM | POA: Diagnosis not present

## 2018-01-31 DIAGNOSIS — E1122 Type 2 diabetes mellitus with diabetic chronic kidney disease: Secondary | ICD-10-CM | POA: Diagnosis not present

## 2018-01-31 DIAGNOSIS — I050 Rheumatic mitral stenosis: Secondary | ICD-10-CM | POA: Diagnosis not present

## 2018-01-31 DIAGNOSIS — M1991 Primary osteoarthritis, unspecified site: Secondary | ICD-10-CM | POA: Diagnosis not present

## 2018-01-31 DIAGNOSIS — I69393 Ataxia following cerebral infarction: Secondary | ICD-10-CM | POA: Diagnosis not present

## 2018-01-31 DIAGNOSIS — Z794 Long term (current) use of insulin: Secondary | ICD-10-CM | POA: Diagnosis not present

## 2018-01-31 DIAGNOSIS — N183 Chronic kidney disease, stage 3 (moderate): Secondary | ICD-10-CM | POA: Diagnosis not present

## 2018-01-31 DIAGNOSIS — Z7901 Long term (current) use of anticoagulants: Secondary | ICD-10-CM | POA: Diagnosis not present

## 2018-01-31 DIAGNOSIS — I2721 Secondary pulmonary arterial hypertension: Secondary | ICD-10-CM | POA: Diagnosis not present

## 2018-01-31 DIAGNOSIS — I5030 Unspecified diastolic (congestive) heart failure: Secondary | ICD-10-CM | POA: Diagnosis not present

## 2018-01-31 DIAGNOSIS — I69320 Aphasia following cerebral infarction: Secondary | ICD-10-CM | POA: Diagnosis not present

## 2018-01-31 DIAGNOSIS — I130 Hypertensive heart and chronic kidney disease with heart failure and stage 1 through stage 4 chronic kidney disease, or unspecified chronic kidney disease: Secondary | ICD-10-CM | POA: Diagnosis not present

## 2018-01-31 DIAGNOSIS — I2510 Atherosclerotic heart disease of native coronary artery without angina pectoris: Secondary | ICD-10-CM | POA: Diagnosis not present

## 2018-02-03 DIAGNOSIS — Z794 Long term (current) use of insulin: Secondary | ICD-10-CM | POA: Diagnosis not present

## 2018-02-03 DIAGNOSIS — Z7901 Long term (current) use of anticoagulants: Secondary | ICD-10-CM | POA: Diagnosis not present

## 2018-02-03 DIAGNOSIS — N183 Chronic kidney disease, stage 3 (moderate): Secondary | ICD-10-CM | POA: Diagnosis not present

## 2018-02-03 DIAGNOSIS — E1122 Type 2 diabetes mellitus with diabetic chronic kidney disease: Secondary | ICD-10-CM | POA: Diagnosis not present

## 2018-02-03 DIAGNOSIS — I5030 Unspecified diastolic (congestive) heart failure: Secondary | ICD-10-CM | POA: Diagnosis not present

## 2018-02-03 DIAGNOSIS — M1991 Primary osteoarthritis, unspecified site: Secondary | ICD-10-CM | POA: Diagnosis not present

## 2018-02-03 DIAGNOSIS — I2510 Atherosclerotic heart disease of native coronary artery without angina pectoris: Secondary | ICD-10-CM | POA: Diagnosis not present

## 2018-02-03 DIAGNOSIS — I69320 Aphasia following cerebral infarction: Secondary | ICD-10-CM | POA: Diagnosis not present

## 2018-02-03 DIAGNOSIS — I130 Hypertensive heart and chronic kidney disease with heart failure and stage 1 through stage 4 chronic kidney disease, or unspecified chronic kidney disease: Secondary | ICD-10-CM | POA: Diagnosis not present

## 2018-02-03 DIAGNOSIS — I69393 Ataxia following cerebral infarction: Secondary | ICD-10-CM | POA: Diagnosis not present

## 2018-02-03 DIAGNOSIS — I2721 Secondary pulmonary arterial hypertension: Secondary | ICD-10-CM | POA: Diagnosis not present

## 2018-02-03 DIAGNOSIS — I050 Rheumatic mitral stenosis: Secondary | ICD-10-CM | POA: Diagnosis not present

## 2018-02-04 ENCOUNTER — Other Ambulatory Visit: Payer: Self-pay | Admitting: *Deleted

## 2018-02-04 NOTE — Patient Outreach (Signed)
[place] [facility]) Care Management  02/04/2018  Megan Obrien 1945-12-24 [id number]  Successful telephone encounter to Megan Obrien, 72 year old female for transition of care/ongoing  Follow up on recent SNF discharge home 01/14/18.  Pt admitted to rehab after recent hospitalization  March 19-25,2019 for Expressive Aphasia, Stroke.   Spoke with pt, HIPAA identifiers verified.  Pt  Reports doing great, has one problem not bad- bleeding since 01/31/18 like a period, slowing down.  Pt  Reports she it happened after she started Eliquis.  Pt reports to see PCP 5/9 to let MD know.   Pt [place] PT still working with me, coming 3 times a week.   Pt reports sugars are always high, did not  Check today, runs in the 300's.   Pt reports she is not sure if she is getting all of her insulin, gives  Herself her shot lying down, leakage noticed after injection.   RN CM suggested pt administer insulin  Sitting up, do not pull needle out right away to which pt said she would try but also she is going to  Talk to PCP at upcoming visit about this.   Pt reports taking all of her medications as ordered, BP not  As high, checked it during phone call as RN CM requested- result was 176/93.   RN CM discussed With pt to start checking BP daily at different times of the day, record, bring to upcoming MD  Visit.     Plan:  As discussed with pt, plan to follow up again next week telephonically.    [name].   [place] Care Management  [phone]

## 2018-02-05 DIAGNOSIS — N183 Chronic kidney disease, stage 3 (moderate): Secondary | ICD-10-CM | POA: Diagnosis not present

## 2018-02-05 DIAGNOSIS — E1122 Type 2 diabetes mellitus with diabetic chronic kidney disease: Secondary | ICD-10-CM | POA: Diagnosis not present

## 2018-02-05 DIAGNOSIS — I5030 Unspecified diastolic (congestive) heart failure: Secondary | ICD-10-CM | POA: Diagnosis not present

## 2018-02-05 DIAGNOSIS — I050 Rheumatic mitral stenosis: Secondary | ICD-10-CM | POA: Diagnosis not present

## 2018-02-05 DIAGNOSIS — I69320 Aphasia following cerebral infarction: Secondary | ICD-10-CM | POA: Diagnosis not present

## 2018-02-05 DIAGNOSIS — Z7901 Long term (current) use of anticoagulants: Secondary | ICD-10-CM | POA: Diagnosis not present

## 2018-02-05 DIAGNOSIS — I130 Hypertensive heart and chronic kidney disease with heart failure and stage 1 through stage 4 chronic kidney disease, or unspecified chronic kidney disease: Secondary | ICD-10-CM | POA: Diagnosis not present

## 2018-02-05 DIAGNOSIS — I2510 Atherosclerotic heart disease of native coronary artery without angina pectoris: Secondary | ICD-10-CM | POA: Diagnosis not present

## 2018-02-05 DIAGNOSIS — Z794 Long term (current) use of insulin: Secondary | ICD-10-CM | POA: Diagnosis not present

## 2018-02-05 DIAGNOSIS — M1991 Primary osteoarthritis, unspecified site: Secondary | ICD-10-CM | POA: Diagnosis not present

## 2018-02-05 DIAGNOSIS — I69393 Ataxia following cerebral infarction: Secondary | ICD-10-CM | POA: Diagnosis not present

## 2018-02-05 DIAGNOSIS — I2721 Secondary pulmonary arterial hypertension: Secondary | ICD-10-CM | POA: Diagnosis not present

## 2018-02-06 ENCOUNTER — Ambulatory Visit (INDEPENDENT_AMBULATORY_CARE_PROVIDER_SITE_OTHER): Payer: Medicare Other | Admitting: Primary Care

## 2018-02-06 VITALS — BP 140/66 | HR 60 | Temp 97.8°F | Ht 66.5 in | Wt 260.2 lb

## 2018-02-06 DIAGNOSIS — I2721 Secondary pulmonary arterial hypertension: Secondary | ICD-10-CM | POA: Diagnosis not present

## 2018-02-06 DIAGNOSIS — F3341 Major depressive disorder, recurrent, in partial remission: Secondary | ICD-10-CM

## 2018-02-06 DIAGNOSIS — Z794 Long term (current) use of insulin: Secondary | ICD-10-CM | POA: Diagnosis not present

## 2018-02-06 DIAGNOSIS — I69320 Aphasia following cerebral infarction: Secondary | ICD-10-CM | POA: Diagnosis not present

## 2018-02-06 DIAGNOSIS — Z7901 Long term (current) use of anticoagulants: Secondary | ICD-10-CM | POA: Diagnosis not present

## 2018-02-06 DIAGNOSIS — I10 Essential (primary) hypertension: Secondary | ICD-10-CM

## 2018-02-06 DIAGNOSIS — I050 Rheumatic mitral stenosis: Secondary | ICD-10-CM | POA: Diagnosis not present

## 2018-02-06 DIAGNOSIS — E785 Hyperlipidemia, unspecified: Secondary | ICD-10-CM | POA: Diagnosis not present

## 2018-02-06 DIAGNOSIS — I130 Hypertensive heart and chronic kidney disease with heart failure and stage 1 through stage 4 chronic kidney disease, or unspecified chronic kidney disease: Secondary | ICD-10-CM | POA: Diagnosis not present

## 2018-02-06 DIAGNOSIS — I69393 Ataxia following cerebral infarction: Secondary | ICD-10-CM | POA: Diagnosis not present

## 2018-02-06 DIAGNOSIS — M1991 Primary osteoarthritis, unspecified site: Secondary | ICD-10-CM | POA: Diagnosis not present

## 2018-02-06 DIAGNOSIS — N183 Chronic kidney disease, stage 3 (moderate): Secondary | ICD-10-CM | POA: Diagnosis not present

## 2018-02-06 DIAGNOSIS — N939 Abnormal uterine and vaginal bleeding, unspecified: Secondary | ICD-10-CM

## 2018-02-06 DIAGNOSIS — E118 Type 2 diabetes mellitus with unspecified complications: Secondary | ICD-10-CM

## 2018-02-06 DIAGNOSIS — I5030 Unspecified diastolic (congestive) heart failure: Secondary | ICD-10-CM | POA: Diagnosis not present

## 2018-02-06 DIAGNOSIS — I2510 Atherosclerotic heart disease of native coronary artery without angina pectoris: Secondary | ICD-10-CM | POA: Diagnosis not present

## 2018-02-06 DIAGNOSIS — E1122 Type 2 diabetes mellitus with diabetic chronic kidney disease: Secondary | ICD-10-CM | POA: Diagnosis not present

## 2018-02-06 LAB — CBC
HCT: 36.6 % (ref 36.0–46.0)
HEMOGLOBIN: 12.7 g/dL (ref 12.0–15.0)
MCHC: 34.6 g/dL (ref 30.0–36.0)
MCV: 91.1 fl (ref 78.0–100.0)
PLATELETS: 175 10*3/uL (ref 150.0–400.0)
RBC: 4.02 Mil/uL (ref 3.87–5.11)
RDW: 13.1 % (ref 11.5–15.5)
WBC: 6.5 10*3/uL (ref 4.0–10.5)

## 2018-02-06 LAB — LIPID PANEL
CHOLESTEROL: 139 mg/dL (ref 0–200)
HDL: 41.1 mg/dL (ref >39.00)
LDL Cholesterol: 68 mg/dL (ref 0–99)
NonHDL: 97.63
TRIGLYCERIDES: 148 mg/dL (ref 0.0–149.0)
Total CHOL/HDL Ratio: 3
VLDL: 29.6 mg/dL (ref 0.0–40.0)

## 2018-02-06 MED ORDER — INSULIN GLARGINE 100 UNIT/ML SOLOSTAR PEN: 30.0000 [IU] | PEN_INJECTOR | Freq: Every evening | SUBCUTANEOUS | 1 refills | Status: DC

## 2018-02-06 NOTE — Assessment & Plan Note (Signed)
Doing well on fluoxetine and Wellbutrin, continue same.

## 2018-02-06 NOTE — Assessment & Plan Note (Signed)
Blood glucose levels too high. Not sure if she's missing some of the insulin as she reports it to be leaking out from the site.   Discussed proper use of her insulin pen. Increase Lantus to 30 units HS. Long discussion about her diet, recommendations provided.   Follow up in 6 weeks for repeat A1C and re-evaluation.

## 2018-02-06 NOTE — Assessment & Plan Note (Signed)
Stable in the office today, lower with home readings. Continue current regimen.

## 2018-02-06 NOTE — Patient Instructions (Addendum)
We've increased your Lantus to 30 units. Inject every night at bedtime.  Continue to monitor your blood sugars every morning before breakfast, 2 hours after lunch, bedtime. Please write down your readings and bring them to your next visit.  Stop by the front desk and speak with either [facility] regarding your ultrasound.  Stop by the lab prior to leaving today. I will notify you of your results once received.   You must work to improve your diet, start eating at least three small meals daily when injecting insulin. Take a look at the information below.  Schedule a 30 minute follow up visit in 6 weeks for diabetes check.  It was a pleasure to see you today!   Diabetes Mellitus and Nutrition When you have diabetes (diabetes mellitus), it is very important to have healthy eating habits because your blood sugar (glucose) levels are greatly affected by what you eat and drink. Eating healthy foods in the appropriate amounts, at about the same times every day, can help you:  Control your blood glucose.  Lower your risk of heart disease.  Improve your blood pressure.  Reach or maintain a healthy weight.  Every person with diabetes is different, and each person has different needs for a meal plan. Your health care provider may recommend that you work with a diet and nutrition specialist (dietitian) to make a meal plan that is best for you. Your meal plan may vary depending on factors such as:  The calories you need.  The medicines you take.  Your weight.  Your blood glucose, blood pressure, and cholesterol levels.  Your activity level.  Other health conditions you have, such as heart or kidney disease.  How do carbohydrates affect me? Carbohydrates affect your blood glucose level more than any other type of food. Eating carbohydrates naturally increases the amount of glucose in your blood. Carbohydrate counting is a method for keeping track of how many carbohydrates you eat. Counting  carbohydrates is important to keep your blood glucose at a healthy level, especially if you use insulin or take certain oral diabetes medicines. It is important to know how many carbohydrates you can safely have in each meal. This is different for every person. Your dietitian can help you calculate how many carbohydrates you should have at each meal and for snack. Foods that contain carbohydrates include:  Bread, cereal, rice, pasta, and crackers.  Potatoes and corn.  Peas, beans, and lentils.  Milk and yogurt.  Fruit and juice.  Desserts, such as cakes, cookies, ice cream, and candy.  How does alcohol affect me? Alcohol can cause a sudden decrease in blood glucose (hypoglycemia), especially if you use insulin or take certain oral diabetes medicines. Hypoglycemia can be a life-threatening condition. Symptoms of hypoglycemia (sleepiness, dizziness, and confusion) are similar to symptoms of having too much alcohol. If your health care provider says that alcohol is safe for you, follow these guidelines:  Limit alcohol intake to no more than 1 drink per day for nonpregnant women and 2 drinks per day for men. One drink equals 12 oz of beer, 5 oz of wine, or 1 oz of hard liquor.  Do not drink on an empty stomach.  Keep yourself hydrated with water, diet soda, or unsweetened iced tea.  Keep in mind that regular soda, juice, and other mixers may contain a lot of sugar and must be counted as carbohydrates.  What are tips for following this plan? Reading food labels  Start by checking the serving  size on the label. The amount of calories, carbohydrates, fats, and other nutrients listed on the label are based on one serving of the food. Many foods contain more than one serving per package.  Check the total grams (g) of carbohydrates in one serving. You can calculate the number of servings of carbohydrates in one serving by dividing the total carbohydrates by 15. For example, if a food has 30 g  of total carbohydrates, it would be equal to 2 servings of carbohydrates.  Check the number of grams (g) of saturated and trans fats in one serving. Choose foods that have low or no amount of these fats.  Check the number of milligrams (mg) of sodium in one serving. Most people should limit total sodium intake to less than 2,300 mg per day.  Always check the nutrition information of foods labeled as "low-fat" or "nonfat". These foods may be higher in added sugar or refined carbohydrates and should be avoided.  Talk to your dietitian to identify your daily goals for nutrients listed on the label. Shopping  Avoid buying canned, premade, or processed foods. These foods tend to be high in fat, sodium, and added sugar.  Shop around the outside edge of the grocery store. This includes fresh fruits and vegetables, bulk grains, fresh meats, and fresh dairy. Cooking  Use low-heat cooking methods, such as baking, instead of high-heat cooking methods like deep frying.  Cook using healthy oils, such as olive, canola, or sunflower oil.  Avoid cooking with butter, cream, or high-fat meats. Meal planning  Eat meals and snacks regularly, preferably at the same times every day. Avoid going long periods of time without eating.  Eat foods high in fiber, such as fresh fruits, vegetables, beans, and whole grains. Talk to your dietitian about how many servings of carbohydrates you can eat at each meal.  Eat 4-6 ounces of lean protein each day, such as lean meat, chicken, fish, eggs, or tofu. 1 ounce is equal to 1 ounce of meat, chicken, or fish, 1 egg, or 1/4 cup of tofu.  Eat some foods each day that contain healthy fats, such as avocado, nuts, seeds, and fish. Lifestyle   Check your blood glucose regularly.  Exercise at least 30 minutes 5 or more days each week, or as told by your health care provider.  Take medicines as told by your health care provider.  Do not use any products that contain  nicotine or tobacco, such as cigarettes and e-cigarettes. If you need help quitting, ask your health care provider.  Work with a Social worker or diabetes educator to identify strategies to manage stress and any emotional and social challenges. What are some questions to ask my health care provider?  Do I need to meet with a diabetes educator?  Do I need to meet with a dietitian?  What number can I call if I have questions?  When are the best times to check my blood glucose? Where to find more information:  American Diabetes Association: diabetes.org/food-and-fitness/food  Academy of Nutrition and Dietetics: PokerClues.dk  [employer] of Diabetes and Digestive and Kidney Diseases (NIH): ContactWire.be Summary  A healthy meal plan will help you control your blood glucose and maintain a healthy lifestyle.  Working with a diet and nutrition specialist (dietitian) can help you make a meal plan that is best for you.  Keep in mind that carbohydrates and alcohol have immediate effects on your blood glucose levels. It is important to count carbohydrates and to use alcohol  carefully. This information is not intended to replace advice given to you by your health care provider. Make sure you discuss any questions you have with your health care provider. Document Released: 06/14/2005 Document Revised: 10/22/2016 Document Reviewed: 10/22/2016 Elsevier Interactive Patient Education  [employer].

## 2018-02-06 NOTE — Progress Notes (Signed)
Subjective:    Patient ID: Megan Obrien, female    DOB: 1945-11-14, 72 y.o.   MRN: [id number]  HPI  [name] is a 72 year old female who presents today for follow up.  Current medications include: Lantus 25 units HS  She is checking her blood glucose 1-2 times daily and is getting readings of: AM Fasting: 309, 291, 348, 283, 340, 320 Before Lunch: 264, 314 After Lunch: 403, 303 Bedtime: 209, 390, 309  She thinks some of the insulin may be draining out of the site after she uses the injection.   Last A1C: 11.4  On 12/18/17 Last Eye Exam: Completed in September 2018 Pneumonia Vaccination: UTD ACE/ARB: Lisinopril  Statin: atorvastatin   Diet currently consists of:  Breakfast: Skips mostly, fast food Lunch: Occasionally, sandwich Dinner: Skips Snacks: Sandwich, chips, pretzels  Desserts: Fig [place] Bars Beverages: Diet soda, sometimes water   2) Essential Hypertension: Currently managed on carvedilol 12.5 mg BID, amlodipine 5 mg BID, lisinopril 40 mg daily. She's been blood pressure at home infrequently, her reading this morning was 110/58.    BP Readings from Last 3 Encounters:  02/06/18 140/66  01/28/18 (!) 160/80  01/16/18 (!) 144/80   Wt Readings from Last 3 Encounters:  02/06/18 260 lb 4 oz (118 kg)  01/28/18 265 lb (120.2 kg)  01/16/18 265 lb (120.2 kg)     3) Depression: Currently managed on fluoxetine 40 mg and Wellbutrin SR 150 mg BID. Overall she's feeling well managed. Denies SI/HI, increased depression symptoms.   4) Vaginal Bleeding: Present for the past 6 days, nearly gone today. She's been wearing pads and is going through 2-3 daily. She denies vaginal itching, vaginal discharge. This has been going on for years intermittently but will typically last for one day. This is the worst episode yet. She has felt dizzy this morning, had to have someone help her out of the car in the clinic parking lot.      Review of Systems  Respiratory: Negative  for shortness of breath.   Cardiovascular: Negative for chest pain and palpitations.  Genitourinary: Positive for vaginal bleeding.  Neurological: Positive for dizziness. Negative for numbness.       Past Medical History:  Diagnosis Date  . (HFpEF) heart failure with preserved ejection fraction ([place])    a. 03/2017 Echo: EF 55-60%, no rwma, Gr1 DD, mild to mod MS, mod to sev dil LA.  [name] Arthritis   . Carotid arterial disease ([place])    a. 11/2017 CTA Head/Neck: RICA 45, LICA 60.  [name] Cerebrovascular disease    a. 11/2017 MRA: mod to sev stenosis of bilat PCA's P2 segments; b. 11/2017 CTA Head/Neck: RICA 45, LICA 60, sev bilat distal MCA branch stenoses w/ sev bilat P2 stenoses.  . CKD (chronic kidney disease), stage III ([place])   . Depression   . GERD (gastroesophageal reflux disease)   . Hyperlipidemia   . Hypertension   . Left Atrial Appendage Thrombus    a. 11/2017 TEE (performed in setting of stroke/aphasia): nl EF, mod MS, dil LA w/ heavy smoke and small LAA thrombus-->Warfarin initiated.  . Mitral stenosis    a. Mild to moderate by echo 03/2017; b. 03/2017 R/LHC: PCWP 93mmHg, LVEDP 74mmHg. Mean grad of 60mmHg. Valve area of 2.02cm^2; c. 11/2017 TEE: Mod MS.  . Morbid obesity ([place])   . Murmur   . Non-obstructive CAD (coronary artery disease)    a. 03/2017 Cath: mild, non-obstructive CAD.  [name] OSA (  obstructive sleep apnea)    a. Previously wore CPAP for several years but stopped on her own and now just uses O2 via [place] @ night.  [name] PAH (pulmonary artery hypertension) ([place])    a. 03/2017 RHC: PA 39mmHg.  . Seasonal allergies   . Stroke [facility])    a. 11/2017 - presented w/ aphasia->MRI acute to early subacute ischemia w/in multiple vascular territories, largest in R frontal & L parietal lobes. Smaller foci of ischemia w/in R hippocampus, R parietal lobe, and L peripheral postcentral gyrus.  . Type 2 diabetes mellitus ([place])   . Urinary incontinence      Social History   Socioeconomic History  .  Marital status: Widowed    Spouse name: Not on file  . Number of children: 2  . Years of education: 12 years   . Highest education level: 12th grade  Occupational History  . Occupation: retired   Scientific laboratory technician  . Financial resource strain: Not hard at all  . Food insecurity:    Worry: Never true    Inability: Never true  . Transportation needs:    Medical: No    Non-medical: No  Tobacco Use  . Smoking status: Never Smoker  . Smokeless tobacco: Never Used  Substance and Sexual Activity  . Alcohol use: No    Alcohol/week: 0.0 oz    Comment: rarely  . Drug use: No  . Sexual activity: Never  Lifestyle  . Physical activity:    Days per week: 1 day    Minutes per session: 40 min  . Stress: Not at all  Relationships  . Social connections:    Talks on phone: Three times a week    Gets together: Twice a week    Attends religious service: Never    Active member of club or organization: No    Attends meetings of clubs or organizations: Never    Relationship status: Widowed  . Intimate partner violence:    Fear of current or ex partner: Patient refused    Emotionally abused: Patient refused    Physically abused: Patient refused    Forced sexual activity: Patient refused  Other Topics Concern  . Not on file  Social History Narrative   Widow.   2 children, numerous grandchildren.   Retired. Once worked on a Publishing copy.   Enjoys playing computer games, spending time with family.    Past Surgical History:  Procedure Laterality Date  . BACK SURGERY    . CARDIAC CATHETERIZATION    . KNEE SURGERY Left   . RIGHT/LEFT HEART CATH AND CORONARY ANGIOGRAPHY Bilateral 04/25/2017   Procedure: Right/Left Heart Cath and Coronary Angiography;  Surgeon: Wellington Hampshire, MD;  Location: [place] [facility];  Service: Cardiovascular;  Laterality: Bilateral;  . TEE WITHOUT CARDIOVERSION N/A 12/20/2017   Procedure: TRANSESOPHAGEAL ECHOCARDIOGRAM (TEE);  Surgeon: Wellington Hampshire, MD;   Location: [facility] ORS;  Service: Cardiovascular;  Laterality: N/A;  . TONSILLECTOMY AND ADENOIDECTOMY  1951    Family History  Problem Relation Age of Onset  . Lung cancer Father   . Alzheimer's disease Mother   . Stroke Maternal Grandfather     No Known Allergies  Current Outpatient Medications on File Prior to Visit  Medication Sig Dispense Refill  . acetaminophen (TYLENOL) 650 MG CR tablet Take 650 mg by mouth every 8 (eight) hours as needed for pain.     [name] amLODipine (NORVASC) 5 MG tablet Take 1 tablet (5 mg total) by mouth  daily.    . apixaban (ELIQUIS) 5 MG TABS tablet Take 5 mg by mouth 2 (two) times daily.    [name] atorvastatin (LIPITOR) 40 MG tablet Take 1 tablet (40 mg total) by mouth daily. (Patient taking differently: Take 40 mg by mouth at bedtime. ) 90 tablet 1  . buPROPion (WELLBUTRIN SR) 150 MG 12 hr tablet Take 150 mg by mouth 2 (two) times daily.    . carvedilol (COREG) 12.5 MG tablet Take 18.75 mg by mouth 2 (two) times daily.    . ferrous sulfate 325 (65 FE) MG tablet Take 325 mg by mouth daily.    [name] FLUoxetine (PROZAC) 40 MG capsule Take 40 mg by mouth daily.    . furosemide (LASIX) 40 MG tablet Take 40 mg by mouth daily.    [name] lisinopril (PRINIVIL,ZESTRIL) 40 MG tablet Take 40 mg by mouth daily.    . Melatonin 10 MG TABS Take 10 mg by mouth at bedtime.    [name] omega-3 acid ethyl esters (LOVAZA) 1 g capsule Take 1 g by mouth daily.    [name] omeprazole (PRILOSEC) 40 MG capsule Take 40 mg by mouth daily.    [name] oxybutynin (DITROPAN-XL) 5 MG 24 hr tablet Take 5 mg by mouth 3 (three) times daily.    . Cholecalciferol (VITAMIN D3) 2000 units CHEW Chew by mouth. Once a day    . fluticasone (FLONASE) 50 MCG/ACT nasal spray Place 1 spray into both nostrils daily. (Patient not taking: Reported on 01/28/2018) 48 g 1  . triamcinolone cream (KENALOG) 0.1 % Apply 1 application topically 2 (two) times daily. (Patient not taking: Reported on 02/06/2018) 30 g 0   No current facility-administered  medications on file prior to visit.     BP 140/66   Pulse 60   Temp 97.8 F (36.6 C) (Oral)   Ht 5' 6.5" (1.689 m)   Wt 260 lb 4 oz (118 kg)   SpO2 95%   BMI 41.38 kg/m    Objective:   Physical Exam  Constitutional: She is oriented to person, place, and time. She appears well-nourished.  Cardiovascular: Normal rate and regular rhythm.  Pulmonary/Chest: Effort normal.  Abdominal: Soft. Bowel sounds are normal. There is no tenderness.  Neurological: She is alert and oriented to person, place, and time.  Skin: Skin is warm and dry.  Skin tear to right posterior forearm.           Assessment & Plan:

## 2018-02-06 NOTE — Assessment & Plan Note (Signed)
On high intensity statin, continue same. Repeat lipids pending.

## 2018-02-06 NOTE — Assessment & Plan Note (Signed)
This is the first time this was disclosed to me, but apparently has been bleeding intermittently for years. Seems like her most recent episode was prolonged with heavier flow.  Pelvic and transvaginal US pending. On Eliquis, continue for now. Consider GYN referral.

## 2018-02-10 DIAGNOSIS — I69393 Ataxia following cerebral infarction: Secondary | ICD-10-CM | POA: Diagnosis not present

## 2018-02-10 DIAGNOSIS — I69320 Aphasia following cerebral infarction: Secondary | ICD-10-CM | POA: Diagnosis not present

## 2018-02-10 DIAGNOSIS — N183 Chronic kidney disease, stage 3 (moderate): Secondary | ICD-10-CM | POA: Diagnosis not present

## 2018-02-10 DIAGNOSIS — E1122 Type 2 diabetes mellitus with diabetic chronic kidney disease: Secondary | ICD-10-CM | POA: Diagnosis not present

## 2018-02-10 DIAGNOSIS — Z794 Long term (current) use of insulin: Secondary | ICD-10-CM | POA: Diagnosis not present

## 2018-02-10 DIAGNOSIS — I050 Rheumatic mitral stenosis: Secondary | ICD-10-CM | POA: Diagnosis not present

## 2018-02-10 DIAGNOSIS — M1991 Primary osteoarthritis, unspecified site: Secondary | ICD-10-CM | POA: Diagnosis not present

## 2018-02-10 DIAGNOSIS — Z7901 Long term (current) use of anticoagulants: Secondary | ICD-10-CM | POA: Diagnosis not present

## 2018-02-10 DIAGNOSIS — I130 Hypertensive heart and chronic kidney disease with heart failure and stage 1 through stage 4 chronic kidney disease, or unspecified chronic kidney disease: Secondary | ICD-10-CM | POA: Diagnosis not present

## 2018-02-10 DIAGNOSIS — I5030 Unspecified diastolic (congestive) heart failure: Secondary | ICD-10-CM | POA: Diagnosis not present

## 2018-02-10 DIAGNOSIS — I2721 Secondary pulmonary arterial hypertension: Secondary | ICD-10-CM | POA: Diagnosis not present

## 2018-02-10 DIAGNOSIS — I2510 Atherosclerotic heart disease of native coronary artery without angina pectoris: Secondary | ICD-10-CM | POA: Diagnosis not present

## 2018-02-11 ENCOUNTER — Ambulatory Visit: Payer: Medicare Other | Admitting: Nurse Practitioner

## 2018-02-11 ENCOUNTER — Ambulatory Visit: Payer: Medicare Other

## 2018-02-11 NOTE — Progress Notes (Deleted)
Office Visit    Patient Name: Megan Obrien Date of Encounter: 02/11/2018  Primary Care Provider:  Pleas Koch, NP Primary Cardiologist:  [name], MD  Chief Complaint    72 year old female with a history of HFpEF, stage III chronic kidney disease, hypertension, hyperlipidemia, moderate mitral stenosis, nonobstructive CAD, morbid obesity, pulmonary hypertension, type 2 diabetes mellitus, obstructive sleep apnea, and recent stroke with finding of left atrial appendage thrombus and presumed paroxysmal atrial fibrillation, who presents for follow-up.  Past Medical History    Past Medical History:  Diagnosis Date  . (HFpEF) heart failure with preserved ejection fraction ([place])    a. 03/2017 Echo: EF 55-60%, no rwma, Gr1 DD, mild to mod MS, mod to sev dil LA.  [name] Arthritis   . Carotid arterial disease ([place])    a. 11/2017 CTA Head/Neck: RICA 45, LICA 60.  [name] Cerebrovascular disease    a. 11/2017 MRA: mod to sev stenosis of bilat PCA's P2 segments; b. 11/2017 CTA Head/Neck: RICA 45, LICA 60, sev bilat distal MCA branch stenoses w/ sev bilat P2 stenoses.  . CKD (chronic kidney disease), stage III ([place])   . Depression   . GERD (gastroesophageal reflux disease)   . Hyperlipidemia   . Hypertension   . Left Atrial Appendage Thrombus    a. 11/2017 TEE (performed in setting of stroke/aphasia): nl EF, mod MS, dil LA w/ heavy smoke and small LAA thrombus-->Warfarin initiated.  . Mitral stenosis    a. Mild to moderate by echo 03/2017; b. 03/2017 R/LHC: PCWP 82mmHg, LVEDP 13mmHg. Mean grad of 7mmHg. Valve area of 2.02cm^2; c. 11/2017 TEE: Mod MS.  . Morbid obesity ([place])   . Murmur   . Non-obstructive CAD (coronary artery disease)    a. 03/2017 Cath: mild, non-obstructive CAD.  [name] OSA (obstructive sleep apnea)    a. Previously wore CPAP for several years but stopped on her own and now just uses O2 via [place] @ night.  [name] PAH (pulmonary artery hypertension) ([place])    a. 03/2017 RHC: PA 25mmHg.    . Seasonal allergies   . Stroke [facility])    a. 11/2017 - presented w/ aphasia->MRI acute to early subacute ischemia w/in multiple vascular territories, largest in R frontal & L parietal lobes. Smaller foci of ischemia w/in R hippocampus, R parietal lobe, and L peripheral postcentral gyrus.  . Type 2 diabetes mellitus ([place])   . Urinary incontinence    Past Surgical History:  Procedure Laterality Date  . BACK SURGERY    . CARDIAC CATHETERIZATION    . KNEE SURGERY Left   . RIGHT/LEFT HEART CATH AND CORONARY ANGIOGRAPHY Bilateral 04/25/2017   Procedure: Right/Left Heart Cath and Coronary Angiography;  Surgeon: Wellington Hampshire, MD;  Location: [place] [facility];  Service: Cardiovascular;  Laterality: Bilateral;  . TEE WITHOUT CARDIOVERSION N/A 12/20/2017   Procedure: TRANSESOPHAGEAL ECHOCARDIOGRAM (TEE);  Surgeon: Wellington Hampshire, MD;  Location: [facility] ORS;  Service: Cardiovascular;  Laterality: N/A;  . TONSILLECTOMY AND ADENOIDECTOMY  1951    Allergies  No Known Allergies  History of Present Illness    72 year old female with the above complex past medical history including HFpEF, stage III chronic kidney disease, hypertension, hyperlipidemia, moderate mitral stenosis, nonobstructive CAD, obesity,  pulmonary arterial hypertension, type 2 diabetes mellitus, and obstructive sleep apnea.  She was previously worked up for mitral stenosis in the summer 2018, with right and left heart cath revealing nonobstructive CAD and moderately elevated right heart filling pressures  and severe pulmonary hypertension.  She was recently admitted to [facility] with dizziness and expressive aphasia and was found to have right frontal and left parietal ischemia with small foci of ischemia in the right hippocampus, right parietal lobe, and left peripheral postcentral gyrus.  CTA of the head and neck showed moderate bilateral carotid disease and severe distal MCA branch stenoses with severe bilateral P2 stenoses.   TEE showed normal LV function with moderate mitral stenosis, dilated left atrium, and heavy left atrial smoke with small left atrial appendage thrombus.  In that setting, we were consulted and she was placed on warfarin.  She was discharged home March 25 and return to the ER March 29 following a fall.  Imaging was negative she was discharged back to skilled nursing facility.  Home Medications    Prior to Admission medications   Medication Sig Start Date End Date Taking? Authorizing Provider  acetaminophen (TYLENOL) 650 MG CR tablet Take 650 mg by mouth every 8 (eight) hours as needed for pain.     [provider]  amLODipine (NORVASC) 5 MG tablet Take 1 tablet (5 mg total) by mouth daily. 12/24/17   Hillary Bow, MD  apixaban (ELIQUIS) 5 MG TABS tablet Take 5 mg by mouth 2 (two) times daily.    [provider]  atorvastatin (LIPITOR) 40 MG tablet Take 1 tablet (40 mg total) by mouth daily. Patient taking differently: Take 40 mg by mouth at bedtime.  01/17/17   Pleas Koch, NP  buPROPion (WELLBUTRIN SR) 150 MG 12 hr tablet Take 150 mg by mouth 2 (two) times daily.    [provider]  carvedilol (COREG) 12.5 MG tablet Take 18.75 mg by mouth 2 (two) times daily.    [provider]  Cholecalciferol (VITAMIN D3) 2000 units CHEW Chew by mouth. Once a day    [provider]  ferrous sulfate 325 (65 FE) MG tablet Take 325 mg by mouth daily.    [provider]  FLUoxetine (PROZAC) 40 MG capsule Take 40 mg by mouth daily.    [provider]  fluticasone (FLONASE) 50 MCG/ACT nasal spray Place 1 spray into both nostrils daily. Patient not taking: Reported on 01/28/2018 02/21/17   Pleas Koch, NP  furosemide (LASIX) 40 MG tablet Take 40 mg by mouth daily.    [provider]  Insulin Glargine (LANTUS) 100 UNIT/ML Solostar Pen Inject 30 Units into the skin every evening. 02/06/18   Pleas Koch, NP  lisinopril  (PRINIVIL,ZESTRIL) 40 MG tablet Take 40 mg by mouth daily.    [provider]  Melatonin 10 MG TABS Take 10 mg by mouth at bedtime.    [provider]  omega-3 acid ethyl esters (LOVAZA) 1 g capsule Take 1 g by mouth daily.    [provider]  omeprazole (PRILOSEC) 40 MG capsule Take 40 mg by mouth daily.    [provider]  oxybutynin (DITROPAN-XL) 5 MG 24 hr tablet Take 5 mg by mouth 3 (three) times daily.    [provider]  triamcinolone cream (KENALOG) 0.1 % Apply 1 application topically 2 (two) times daily. Patient not taking: Reported on 02/06/2018 08/30/16   Pleas Koch, NP    Review of Systems    ***.  All other systems reviewed and are otherwise negative except as noted above.  Physical Exam    VS:  There were no vitals taken for this visit. , BMI There is no height  or weight on file to calculate BMI. GEN: Well nourished, well developed, in no acute distress. HEENT: normal. Neck: Supple, no JVD, carotid bruits, or masses. Cardiac: RRR, no murmurs, rubs, or gallops. No clubbing, cyanosis, edema.  Radials/DP/PT 2+ and equal bilaterally.  Respiratory:  Respirations regular and unlabored, clear to auscultation bilaterally. GI: Soft, nontender, nondistended, BS + x 4. MS: no deformity or atrophy. Skin: warm and dry, no rash. Neuro:  Strength and sensation are intact. Psych: Normal affect.  Accessory Clinical Findings    ECG - ***  Assessment & Plan    1.  ***   Murray Hodgkins, NP 02/11/2018, 8:06 AM

## 2018-02-12 ENCOUNTER — Other Ambulatory Visit: Payer: Self-pay | Admitting: *Deleted

## 2018-02-12 DIAGNOSIS — I5030 Unspecified diastolic (congestive) heart failure: Secondary | ICD-10-CM | POA: Diagnosis not present

## 2018-02-12 DIAGNOSIS — I130 Hypertensive heart and chronic kidney disease with heart failure and stage 1 through stage 4 chronic kidney disease, or unspecified chronic kidney disease: Secondary | ICD-10-CM | POA: Diagnosis not present

## 2018-02-12 DIAGNOSIS — E1122 Type 2 diabetes mellitus with diabetic chronic kidney disease: Secondary | ICD-10-CM | POA: Diagnosis not present

## 2018-02-12 DIAGNOSIS — I2510 Atherosclerotic heart disease of native coronary artery without angina pectoris: Secondary | ICD-10-CM | POA: Diagnosis not present

## 2018-02-12 DIAGNOSIS — Z7901 Long term (current) use of anticoagulants: Secondary | ICD-10-CM | POA: Diagnosis not present

## 2018-02-12 DIAGNOSIS — M1991 Primary osteoarthritis, unspecified site: Secondary | ICD-10-CM | POA: Diagnosis not present

## 2018-02-12 DIAGNOSIS — Z794 Long term (current) use of insulin: Secondary | ICD-10-CM | POA: Diagnosis not present

## 2018-02-12 DIAGNOSIS — I2721 Secondary pulmonary arterial hypertension: Secondary | ICD-10-CM | POA: Diagnosis not present

## 2018-02-12 DIAGNOSIS — G4730 Sleep apnea, unspecified: Secondary | ICD-10-CM | POA: Diagnosis not present

## 2018-02-12 DIAGNOSIS — I69393 Ataxia following cerebral infarction: Secondary | ICD-10-CM | POA: Diagnosis not present

## 2018-02-12 DIAGNOSIS — N183 Chronic kidney disease, stage 3 (moderate): Secondary | ICD-10-CM | POA: Diagnosis not present

## 2018-02-12 DIAGNOSIS — I69320 Aphasia following cerebral infarction: Secondary | ICD-10-CM | POA: Diagnosis not present

## 2018-02-12 DIAGNOSIS — I050 Rheumatic mitral stenosis: Secondary | ICD-10-CM | POA: Diagnosis not present

## 2018-02-12 NOTE — Patient Outreach (Signed)
[facility]) Care Management  02/12/2018  Megan Obrien [birth year] [id number]  Successful telephone encounter to Megan Obrien, 72 year old female for transition of care, ongoing follow up on SNF discharge home 01/14/18, admitted to rehab after recent  Hospitalization March 19-25,2019 for Expressive Aphasia, Stroke.  Spoke with pt, HIPAA  Identifiers verified.  Pt reports on recent PCP visit, not going to have Ultrasound (check  Source of bleeding), procedure is invasive which she does not like, no more bleeding now  For a week, to call and cancel.   Pt reports is going to follow up with Heart MD.  Pt reports PCP increased her Lantus to 30 units, insulin still leaks after injection even after holding  Needle, has not been given herself extra insulin.   Pt reports has not checked her sugars  In 3 days, call from PCP office was told labs were normal.  RN CM discussed with pt  Starting back checking sugars three times a day/record per PCP request to which pt  Agreed to do. Pt reports HH PT and OT are still coming.  Pt reports BP little elevated at  PCP office, 136/86, HR 51.    Plan:  As discussed with pt, to follow up again next week telephonically.    Megan Obrien.   [place] Care Management  [phone]

## 2018-02-13 ENCOUNTER — Telehealth: Payer: Self-pay | Admitting: Primary Care

## 2018-02-13 NOTE — Telephone Encounter (Signed)
Copied from [place] #[id number]. Topic: Quick Communication - Rx Refill/Question >> Feb 13, 2018  5:44 PM Megan Obrien wrote: Pt needing to discuss why the insulin pen is so high.  And to discuss what options can be done for lower cost.

## 2018-02-13 NOTE — Telephone Encounter (Signed)
Copied from [place] #[id number]. Topic: Quick Communication - See Telephone Encounter >> Feb 13, 2018  5:28 PM Rutherford Nail, [place] wrote: CRM for notification. See Telephone encounter for: 02/13/18. Patient's daughter calling and states that the lantis needles were not prescribed. States she needs these sent to the pharmacy so her mother can start taking the insulin. [place], [place] CB#: [phone]

## 2018-02-14 DIAGNOSIS — I2510 Atherosclerotic heart disease of native coronary artery without angina pectoris: Secondary | ICD-10-CM | POA: Diagnosis not present

## 2018-02-14 DIAGNOSIS — E1122 Type 2 diabetes mellitus with diabetic chronic kidney disease: Secondary | ICD-10-CM | POA: Diagnosis not present

## 2018-02-14 DIAGNOSIS — Z7901 Long term (current) use of anticoagulants: Secondary | ICD-10-CM | POA: Diagnosis not present

## 2018-02-14 DIAGNOSIS — I69393 Ataxia following cerebral infarction: Secondary | ICD-10-CM | POA: Diagnosis not present

## 2018-02-14 DIAGNOSIS — I2721 Secondary pulmonary arterial hypertension: Secondary | ICD-10-CM | POA: Diagnosis not present

## 2018-02-14 DIAGNOSIS — I5030 Unspecified diastolic (congestive) heart failure: Secondary | ICD-10-CM | POA: Diagnosis not present

## 2018-02-14 DIAGNOSIS — N183 Chronic kidney disease, stage 3 (moderate): Secondary | ICD-10-CM | POA: Diagnosis not present

## 2018-02-14 DIAGNOSIS — Z794 Long term (current) use of insulin: Secondary | ICD-10-CM | POA: Diagnosis not present

## 2018-02-14 DIAGNOSIS — I130 Hypertensive heart and chronic kidney disease with heart failure and stage 1 through stage 4 chronic kidney disease, or unspecified chronic kidney disease: Secondary | ICD-10-CM | POA: Diagnosis not present

## 2018-02-14 DIAGNOSIS — I69320 Aphasia following cerebral infarction: Secondary | ICD-10-CM | POA: Diagnosis not present

## 2018-02-14 DIAGNOSIS — M1991 Primary osteoarthritis, unspecified site: Secondary | ICD-10-CM | POA: Diagnosis not present

## 2018-02-14 DIAGNOSIS — I050 Rheumatic mitral stenosis: Secondary | ICD-10-CM | POA: Diagnosis not present

## 2018-02-14 MED ORDER — INSULIN PEN NEEDLE 32G X 6 MM MISC: 2 refills | Status: DC

## 2018-02-14 NOTE — Telephone Encounter (Signed)
Spoken to patient and have sent a prescription for pen needles so insurance will help pay the cost.

## 2018-02-14 NOTE — Telephone Encounter (Signed)
Spoken to patient. I was told that they went and purchase pen needles out of pocket. I have notified patient that I have send the Rx for the pen needles to [facility].

## 2018-02-17 DIAGNOSIS — I130 Hypertensive heart and chronic kidney disease with heart failure and stage 1 through stage 4 chronic kidney disease, or unspecified chronic kidney disease: Secondary | ICD-10-CM | POA: Diagnosis not present

## 2018-02-17 DIAGNOSIS — I2721 Secondary pulmonary arterial hypertension: Secondary | ICD-10-CM | POA: Diagnosis not present

## 2018-02-17 DIAGNOSIS — Z794 Long term (current) use of insulin: Secondary | ICD-10-CM | POA: Diagnosis not present

## 2018-02-17 DIAGNOSIS — E1122 Type 2 diabetes mellitus with diabetic chronic kidney disease: Secondary | ICD-10-CM | POA: Diagnosis not present

## 2018-02-17 DIAGNOSIS — I050 Rheumatic mitral stenosis: Secondary | ICD-10-CM | POA: Diagnosis not present

## 2018-02-17 DIAGNOSIS — N183 Chronic kidney disease, stage 3 (moderate): Secondary | ICD-10-CM | POA: Diagnosis not present

## 2018-02-17 DIAGNOSIS — I69393 Ataxia following cerebral infarction: Secondary | ICD-10-CM | POA: Diagnosis not present

## 2018-02-17 DIAGNOSIS — M1991 Primary osteoarthritis, unspecified site: Secondary | ICD-10-CM | POA: Diagnosis not present

## 2018-02-17 DIAGNOSIS — I69320 Aphasia following cerebral infarction: Secondary | ICD-10-CM | POA: Diagnosis not present

## 2018-02-17 DIAGNOSIS — Z7901 Long term (current) use of anticoagulants: Secondary | ICD-10-CM | POA: Diagnosis not present

## 2018-02-17 DIAGNOSIS — I5030 Unspecified diastolic (congestive) heart failure: Secondary | ICD-10-CM | POA: Diagnosis not present

## 2018-02-17 DIAGNOSIS — I2510 Atherosclerotic heart disease of native coronary artery without angina pectoris: Secondary | ICD-10-CM | POA: Diagnosis not present

## 2018-02-19 ENCOUNTER — Ambulatory Visit: Payer: Medicare Other

## 2018-02-19 ENCOUNTER — Ambulatory Visit: Payer: Self-pay | Admitting: *Deleted

## 2018-02-19 ENCOUNTER — Other Ambulatory Visit: Payer: Self-pay | Admitting: Primary Care

## 2018-02-19 DIAGNOSIS — Z7901 Long term (current) use of anticoagulants: Secondary | ICD-10-CM | POA: Diagnosis not present

## 2018-02-19 DIAGNOSIS — I69393 Ataxia following cerebral infarction: Secondary | ICD-10-CM | POA: Diagnosis not present

## 2018-02-19 DIAGNOSIS — I10 Essential (primary) hypertension: Secondary | ICD-10-CM

## 2018-02-19 DIAGNOSIS — N183 Chronic kidney disease, stage 3 (moderate): Secondary | ICD-10-CM | POA: Diagnosis not present

## 2018-02-19 DIAGNOSIS — I2721 Secondary pulmonary arterial hypertension: Secondary | ICD-10-CM | POA: Diagnosis not present

## 2018-02-19 DIAGNOSIS — E1122 Type 2 diabetes mellitus with diabetic chronic kidney disease: Secondary | ICD-10-CM | POA: Diagnosis not present

## 2018-02-19 DIAGNOSIS — I6389 Other cerebral infarction: Secondary | ICD-10-CM

## 2018-02-19 DIAGNOSIS — I050 Rheumatic mitral stenosis: Secondary | ICD-10-CM | POA: Diagnosis not present

## 2018-02-19 DIAGNOSIS — I130 Hypertensive heart and chronic kidney disease with heart failure and stage 1 through stage 4 chronic kidney disease, or unspecified chronic kidney disease: Secondary | ICD-10-CM | POA: Diagnosis not present

## 2018-02-19 DIAGNOSIS — Z794 Long term (current) use of insulin: Secondary | ICD-10-CM | POA: Diagnosis not present

## 2018-02-19 DIAGNOSIS — E782 Mixed hyperlipidemia: Secondary | ICD-10-CM

## 2018-02-19 DIAGNOSIS — M1991 Primary osteoarthritis, unspecified site: Secondary | ICD-10-CM | POA: Diagnosis not present

## 2018-02-19 DIAGNOSIS — F331 Major depressive disorder, recurrent, moderate: Secondary | ICD-10-CM

## 2018-02-19 DIAGNOSIS — I5030 Unspecified diastolic (congestive) heart failure: Secondary | ICD-10-CM | POA: Diagnosis not present

## 2018-02-19 DIAGNOSIS — I69320 Aphasia following cerebral infarction: Secondary | ICD-10-CM | POA: Diagnosis not present

## 2018-02-19 DIAGNOSIS — E785 Hyperlipidemia, unspecified: Secondary | ICD-10-CM

## 2018-02-19 DIAGNOSIS — I2510 Atherosclerotic heart disease of native coronary artery without angina pectoris: Secondary | ICD-10-CM | POA: Diagnosis not present

## 2018-02-19 MED ORDER — ATORVASTATIN CALCIUM 40 MG PO TABS: 40.0000 mg | ORAL_TABLET | Freq: Every day | ORAL | 1 refills | Status: DC

## 2018-02-19 NOTE — Telephone Encounter (Signed)
Pt requesting refills of Historical Provider meds:  Lovaza  Ditropan  Prozac  Lasix  Eliquis  Norvasc  LOV 02/06/18 with Gentry Fitz     [facility] [address], [place]

## 2018-02-19 NOTE — Telephone Encounter (Signed)
Copied from [place] [id number]. Topic: Inquiry >> Feb 19, 2018  1:20 PM Pricilla Handler wrote: Reason for CRM: Patient called requesting refills of 7 Medications: Omega-3 acid ethyl esters (LOVAZA) 1 g capsule, Oxybutynin (DITROPAN-XL) 5 MG 24 hr tablet, Atorvastatin (LIPITOR) 40 MG tablet, FLUoxetine (PROZAC) 40 MG capsule, furosemide (LASIX) 40 MG tablet, Apixaban (ELIQUIS) 5 MG TABS tablet, and AmLODipine (NORVASC) 5 MG tablet. Patient did call the pharmacy, and they instructed her to call our office. Patient's preferred pharmacy is [place], [place] - [place] [phone] (Phone) [phone] (Fax).       Thank You!!!

## 2018-02-20 ENCOUNTER — Other Ambulatory Visit: Payer: Self-pay | Admitting: *Deleted

## 2018-02-20 MED ORDER — FLUOXETINE HCL 40 MG PO CAPS: ORAL_CAPSULE | ORAL | 1 refills | Status: DC

## 2018-02-20 MED ORDER — AMLODIPINE BESYLATE 5 MG PO TABS
ORAL_TABLET | ORAL | 1 refills | Status: DC
Start: 2018-02-20 — End: 2018-04-17

## 2018-02-20 MED ORDER — OMEGA-3-ACID ETHYL ESTERS 1 G PO CAPS: ORAL_CAPSULE | ORAL | 1 refills | Status: DC

## 2018-02-20 MED ORDER — APIXABAN 5 MG PO TABS: 5.0000 mg | ORAL_TABLET | Freq: Two times a day (BID) | ORAL | 1 refills | Status: DC

## 2018-02-20 NOTE — Telephone Encounter (Signed)
Ok to refill?  FLUoxetine (PROZAC) 40 MG capsule Last prescribed in 08/2017  All other medications have not been prescribed by [name]

## 2018-02-20 NOTE — Telephone Encounter (Signed)
Send Lasix to cardiology.  Is she still taking her oxybutynin? If so how is she taking this? Once daily? Twice daily?

## 2018-02-20 NOTE — Patient Outreach (Addendum)
[facility]) Care Management  02/20/2018  Megan Obrien 02/25/1946 [id number]  Successful telephone encounter to Megan Obrien, 72 year old female for final  Transition of care call/ongoing follow up on SNF discharge home 01/14/18, admitted  To rehab after recent hospitalization March 19-25,2019 for Expressive Aphasia,  Stroke.  Spoke with pt, HIPAA identifiers verified.   Pt reports continued to have leakage  On injection site after taking Lantus insulin, HH RN showed her on 02/17/18 that she  Was not taking off the paper for her needle to expose the needle  so therefore was not getting any insulin. Pt  reports she that means she has not been getting her insulin since SNF discharge, reason why sugars were elevated- mostly 400's, couple times 500, coming down now since taking her insulin correctly- today 365.    Pt reports  Has enough needles for insulin pen, recently ordered. RN CM discussed with pt  Importance of continuing to check sugars daily/record/call PCP if continue to be  Elevated to which pt agreed.  Pt reports she wondered why  she was so dizzy at times, knows now why.  Pt reports HH RN is coming twice a week, to come back tomorrow, [facility] PT continues to work with her/doing more walking.   Pt reports taking all medications as ordered, includes Lantus 30 units at night, to follow up with Heart MD 6/11.   Throughout phone conversation, noted at  times difficult for pt to express herself  (recent stroke, expressive aphasia).  Discussed with pt today final transition of care call, to transfer her case to another  RN CM with request to do a follow up call in two weeks - check on her status, sugars  To which pt agreed.  Provided pt with [facility] main office number to call if needed- Case management needs.  Pt confirmed has 24 hour nurse advice line number.    Plan:  As discussed with pt, to transfer her case to another RN CM for ongoing       Follow up.               Transfer of care program ended, care plan updated.   Megan Obrien.   [place] Care Management  [phone]

## 2018-02-21 DIAGNOSIS — I69320 Aphasia following cerebral infarction: Secondary | ICD-10-CM | POA: Diagnosis not present

## 2018-02-21 DIAGNOSIS — I050 Rheumatic mitral stenosis: Secondary | ICD-10-CM | POA: Diagnosis not present

## 2018-02-21 DIAGNOSIS — I2721 Secondary pulmonary arterial hypertension: Secondary | ICD-10-CM | POA: Diagnosis not present

## 2018-02-21 DIAGNOSIS — I69393 Ataxia following cerebral infarction: Secondary | ICD-10-CM | POA: Diagnosis not present

## 2018-02-21 DIAGNOSIS — Z7901 Long term (current) use of anticoagulants: Secondary | ICD-10-CM | POA: Diagnosis not present

## 2018-02-21 DIAGNOSIS — I130 Hypertensive heart and chronic kidney disease with heart failure and stage 1 through stage 4 chronic kidney disease, or unspecified chronic kidney disease: Secondary | ICD-10-CM | POA: Diagnosis not present

## 2018-02-21 DIAGNOSIS — I5030 Unspecified diastolic (congestive) heart failure: Secondary | ICD-10-CM | POA: Diagnosis not present

## 2018-02-21 DIAGNOSIS — Z794 Long term (current) use of insulin: Secondary | ICD-10-CM | POA: Diagnosis not present

## 2018-02-21 DIAGNOSIS — N183 Chronic kidney disease, stage 3 (moderate): Secondary | ICD-10-CM | POA: Diagnosis not present

## 2018-02-21 DIAGNOSIS — I2510 Atherosclerotic heart disease of native coronary artery without angina pectoris: Secondary | ICD-10-CM | POA: Diagnosis not present

## 2018-02-21 DIAGNOSIS — M1991 Primary osteoarthritis, unspecified site: Secondary | ICD-10-CM | POA: Diagnosis not present

## 2018-02-21 DIAGNOSIS — E1122 Type 2 diabetes mellitus with diabetic chronic kidney disease: Secondary | ICD-10-CM | POA: Diagnosis not present

## 2018-02-21 NOTE — Telephone Encounter (Signed)
Ok to send oxybutnin to pharmacy. Change sig to 1 tab PO daily #90

## 2018-02-21 NOTE — Telephone Encounter (Signed)
Spoken to patient. She read off the bottle of the oxybutynin and it stated 3 times daily. However, patient only remember to take it once a day in the morning.

## 2018-02-25 ENCOUNTER — Telehealth: Payer: Self-pay | Admitting: Primary Care

## 2018-02-25 DIAGNOSIS — N183 Chronic kidney disease, stage 3 (moderate): Secondary | ICD-10-CM | POA: Diagnosis not present

## 2018-02-25 DIAGNOSIS — M1991 Primary osteoarthritis, unspecified site: Secondary | ICD-10-CM | POA: Diagnosis not present

## 2018-02-25 DIAGNOSIS — I2510 Atherosclerotic heart disease of native coronary artery without angina pectoris: Secondary | ICD-10-CM | POA: Diagnosis not present

## 2018-02-25 DIAGNOSIS — I69393 Ataxia following cerebral infarction: Secondary | ICD-10-CM | POA: Diagnosis not present

## 2018-02-25 DIAGNOSIS — Z794 Long term (current) use of insulin: Secondary | ICD-10-CM | POA: Diagnosis not present

## 2018-02-25 DIAGNOSIS — I5030 Unspecified diastolic (congestive) heart failure: Secondary | ICD-10-CM | POA: Diagnosis not present

## 2018-02-25 DIAGNOSIS — I130 Hypertensive heart and chronic kidney disease with heart failure and stage 1 through stage 4 chronic kidney disease, or unspecified chronic kidney disease: Secondary | ICD-10-CM | POA: Diagnosis not present

## 2018-02-25 DIAGNOSIS — I69320 Aphasia following cerebral infarction: Secondary | ICD-10-CM | POA: Diagnosis not present

## 2018-02-25 DIAGNOSIS — I2721 Secondary pulmonary arterial hypertension: Secondary | ICD-10-CM | POA: Diagnosis not present

## 2018-02-25 DIAGNOSIS — E118 Type 2 diabetes mellitus with unspecified complications: Secondary | ICD-10-CM

## 2018-02-25 DIAGNOSIS — E1122 Type 2 diabetes mellitus with diabetic chronic kidney disease: Secondary | ICD-10-CM | POA: Diagnosis not present

## 2018-02-25 DIAGNOSIS — Z7901 Long term (current) use of anticoagulants: Secondary | ICD-10-CM | POA: Diagnosis not present

## 2018-02-25 DIAGNOSIS — I050 Rheumatic mitral stenosis: Secondary | ICD-10-CM | POA: Diagnosis not present

## 2018-02-25 NOTE — Telephone Encounter (Signed)
Spoken to patient. Patient stated that her blood sugar has been high. This morning her fasting blood sugar was 500. I asked about more reading which she did not have in hand but they have been consistently in the 500s

## 2018-02-25 NOTE — Telephone Encounter (Signed)
Patient was notified.

## 2018-02-25 NOTE — Telephone Encounter (Signed)
Unable to reach pt by phone to get more info on what BS has been running and how pt is presently taking insulin.

## 2018-02-25 NOTE — Telephone Encounter (Signed)
Tried to call patient but the phone kept ringing for 8 times. Will try again.

## 2018-02-25 NOTE — Telephone Encounter (Signed)
Copied from [place] [id number]. Topic: Quick Communication - See Telephone Encounter >> Feb 25, 2018 11:51 AM Synthia Innocent wrote: CRM for notification. See Telephone encounter for: 02/25/18. Patient would like to speak with Insulin Glargine (LANTUS) 100 UNIT/ML Solostar Pen, home health told her that her dose should be higher.

## 2018-02-25 NOTE — Telephone Encounter (Signed)
Sounds like we need to start her on a second insulin for meal time coverage. With this she'll have to check her glucose 3-4 times daily and inject insulin before each meal (3 times daily) and also continue Lantus at bedtime. Is this something she can do? If so then I'll send the second insulin to her pharmacy and we will call her for readings in 2 weeks.

## 2018-02-26 ENCOUNTER — Ambulatory Visit: Payer: Self-pay

## 2018-02-26 DIAGNOSIS — I5030 Unspecified diastolic (congestive) heart failure: Secondary | ICD-10-CM | POA: Diagnosis not present

## 2018-02-26 DIAGNOSIS — N183 Chronic kidney disease, stage 3 (moderate): Secondary | ICD-10-CM | POA: Diagnosis not present

## 2018-02-26 DIAGNOSIS — E1122 Type 2 diabetes mellitus with diabetic chronic kidney disease: Secondary | ICD-10-CM | POA: Diagnosis not present

## 2018-02-26 DIAGNOSIS — I69393 Ataxia following cerebral infarction: Secondary | ICD-10-CM | POA: Diagnosis not present

## 2018-02-26 DIAGNOSIS — I69320 Aphasia following cerebral infarction: Secondary | ICD-10-CM | POA: Diagnosis not present

## 2018-02-26 DIAGNOSIS — I050 Rheumatic mitral stenosis: Secondary | ICD-10-CM | POA: Diagnosis not present

## 2018-02-26 DIAGNOSIS — I2510 Atherosclerotic heart disease of native coronary artery without angina pectoris: Secondary | ICD-10-CM | POA: Diagnosis not present

## 2018-02-26 DIAGNOSIS — Z794 Long term (current) use of insulin: Secondary | ICD-10-CM | POA: Diagnosis not present

## 2018-02-26 DIAGNOSIS — Z7901 Long term (current) use of anticoagulants: Secondary | ICD-10-CM | POA: Diagnosis not present

## 2018-02-26 DIAGNOSIS — M1991 Primary osteoarthritis, unspecified site: Secondary | ICD-10-CM | POA: Diagnosis not present

## 2018-02-26 DIAGNOSIS — I2721 Secondary pulmonary arterial hypertension: Secondary | ICD-10-CM | POA: Diagnosis not present

## 2018-02-26 DIAGNOSIS — I130 Hypertensive heart and chronic kidney disease with heart failure and stage 1 through stage 4 chronic kidney disease, or unspecified chronic kidney disease: Secondary | ICD-10-CM | POA: Diagnosis not present

## 2018-02-26 MED ORDER — INSULIN ASPART 100 UNIT/ML FLEXPEN: PEN_INJECTOR | SUBCUTANEOUS | 5 refills | Status: DC

## 2018-02-26 NOTE — Telephone Encounter (Signed)
See phone note from yesterday. Did you ever get a hold of her?

## 2018-02-26 NOTE — Telephone Encounter (Signed)
Pt calling to see if PCP will increase her Lantus to 35 units or 40 units per day. Pt stated her CBG yesterday was 342 and today 340. Pt states that she has not missed any shots. Pt stating she is going to the bathroom every 2 hours but stated she has been doing that for a while. She c/o occasional dizziness. Pt c/o occasional dizziness.   Reason for Disposition . [1] Blood glucose > 300 mg/dl (16.5 mmol/l) AND [2] two or more times in a row  Answer Assessment - Initial Assessment Questions 1. BLOOD GLUCOSE: "What is your blood glucose level?"      340 2. ONSET: "When did you check the blood glucose?"     0930 3. USUAL RANGE: "What is your glucose level usually?" (e.g., usual fasting morning value, usual evening value)     Yesterday 342   4. KETONES: "Do you check for ketones (urine or blood test strips)?" If yes, ask: "What does the test show now?"      no 5. TYPE 1 or 2:  "Do you know what type of diabetes you have?"  (e.g., Type 1, Type 2, Gestational; doesn't know)      Type 2 diabetes 6. INSULIN: "Do you take insulin?" If yes, ask: "Have you missed any shots recently?"     Lantus - no missed shots 7. DIABETES PILLS: "Do you take any pills for your diabetes?" If yes, ask: "Have you missed taking any pills recently?"     no 8. OTHER SYMPTOMS: "Do you have any symptoms?" (e.g., fever, frequent urination, difficulty breathing, dizziness, weakness, vomiting)     Frequent urination every 2 hours, dizziness 9. PREGNANCY: "Is there any chance you are pregnant?" "When was your last menstrual period?"     n/a  Protocols used: DIABETES - HIGH BLOOD SUGAR-A-AH

## 2018-02-26 NOTE — Telephone Encounter (Signed)
Spoke with patient via phone regarding glucose readings. She's only checking her glucose once in the AM fasting and is getting readings of 342, 421, 317.  She's already at Lantus 30 units so I believe she'll have better control with meal time coverage. Discussed the addition of Novolog 10 units TID before meals for blood glucose readings at or above 110. Discussed that she'll need to check glucose levels three times daily before meals.   She verbalized understanding of instructions for checking glucose and administration of Novolog. We also discussed to continue Lantus at 30 units HS.   Will call her for glucose readings in 2 weeks. Rx for Novolog insulin sent to pharmacy.

## 2018-02-27 ENCOUNTER — Telehealth: Payer: Self-pay | Admitting: Primary Care

## 2018-02-27 DIAGNOSIS — Z794 Long term (current) use of insulin: Secondary | ICD-10-CM | POA: Diagnosis not present

## 2018-02-27 DIAGNOSIS — M1991 Primary osteoarthritis, unspecified site: Secondary | ICD-10-CM | POA: Diagnosis not present

## 2018-02-27 DIAGNOSIS — I69393 Ataxia following cerebral infarction: Secondary | ICD-10-CM | POA: Diagnosis not present

## 2018-02-27 DIAGNOSIS — I5030 Unspecified diastolic (congestive) heart failure: Secondary | ICD-10-CM | POA: Diagnosis not present

## 2018-02-27 DIAGNOSIS — E1122 Type 2 diabetes mellitus with diabetic chronic kidney disease: Secondary | ICD-10-CM | POA: Diagnosis not present

## 2018-02-27 DIAGNOSIS — N183 Chronic kidney disease, stage 3 (moderate): Secondary | ICD-10-CM | POA: Diagnosis not present

## 2018-02-27 DIAGNOSIS — Z7901 Long term (current) use of anticoagulants: Secondary | ICD-10-CM | POA: Diagnosis not present

## 2018-02-27 DIAGNOSIS — I2510 Atherosclerotic heart disease of native coronary artery without angina pectoris: Secondary | ICD-10-CM | POA: Diagnosis not present

## 2018-02-27 DIAGNOSIS — I050 Rheumatic mitral stenosis: Secondary | ICD-10-CM | POA: Diagnosis not present

## 2018-02-27 DIAGNOSIS — I69320 Aphasia following cerebral infarction: Secondary | ICD-10-CM | POA: Diagnosis not present

## 2018-02-27 DIAGNOSIS — I2721 Secondary pulmonary arterial hypertension: Secondary | ICD-10-CM | POA: Diagnosis not present

## 2018-02-27 DIAGNOSIS — E118 Type 2 diabetes mellitus with unspecified complications: Secondary | ICD-10-CM

## 2018-02-27 DIAGNOSIS — I130 Hypertensive heart and chronic kidney disease with heart failure and stage 1 through stage 4 chronic kidney disease, or unspecified chronic kidney disease: Secondary | ICD-10-CM | POA: Diagnosis not present

## 2018-02-27 MED ORDER — INSULIN LISPRO 100 UNIT/ML (KWIKPEN)
PEN_INJECTOR | SUBCUTANEOUS | 5 refills | Status: DC
Start: 2018-02-27 — End: 2018-02-28

## 2018-02-27 NOTE — Telephone Encounter (Signed)
Please notify patient that I sent in a prescription for Humalog insulin to her pharmacy. It's very similar to Novolog. Inject 10 units into the skin three times daily before meals for blood sugars of 110 or greater. Please have her notify me if this is too costly,

## 2018-02-27 NOTE — Telephone Encounter (Signed)
Spoken and notified patient of Kate Clark's comments. Patient verbalized understanding.  

## 2018-02-27 NOTE — Telephone Encounter (Signed)
Copied from [place] [id number]. Topic: Quick Communication - See Telephone Encounter >> Feb 27, 2018 11:44 AM Boyd Kerbs wrote: CRM for notification. See Telephone encounter for: 02/27/18.  insulin aspart (NOVOLOG FLEXPEN) 100 UNIT/ML FlexPen  Pt called this is over $200.00 and needs a different insulin called in.   [facility] [address], [place] - [place] [address] Phone: [phone] Fax: [phone]

## 2018-02-27 NOTE — Telephone Encounter (Signed)
Pt is calling stating she checked with the pharmacy and the Humalog is going to cost her over $100.00.

## 2018-02-28 MED ORDER — INSULIN GLARGINE 100 UNIT/ML SOLOSTAR PEN: 35.0000 [IU] | PEN_INJECTOR | Freq: Every evening | SUBCUTANEOUS | 1 refills | Status: DC

## 2018-02-28 NOTE — Addendum Note (Signed)
Addended by: Pleas Koch on: 02/28/2018 08:53 AM   Modules accepted: Orders

## 2018-02-28 NOTE — Telephone Encounter (Signed)
Noted, lets have her increase Lantus to 35 units for now, start monitoring blood sugars three times daily as discussed. We will call for readings in 1 week and if no improvement then we'll continue to increase Lantus. Please cancel Novolog and Humalog prescriptions through her pharmacy.

## 2018-02-28 NOTE — Telephone Encounter (Signed)
Spoken and notified patient of Tawni Millers comments. Patient verbalized understanding.  Already called [facility] and canceled the Rx

## 2018-03-03 ENCOUNTER — Ambulatory Visit: Payer: Self-pay | Admitting: *Deleted

## 2018-03-03 ENCOUNTER — Other Ambulatory Visit: Payer: Self-pay

## 2018-03-03 ENCOUNTER — Encounter: Payer: Self-pay | Admitting: Emergency Medicine

## 2018-03-03 ENCOUNTER — Emergency Department
Admission: EM | Admit: 2018-03-03 | Discharge: 2018-03-03 | Disposition: A | Discharge status: Home / Self Care | Payer: Medicare Other | Attending: Emergency Medicine | Admitting: Emergency Medicine

## 2018-03-03 DIAGNOSIS — I110 Hypertensive heart disease with heart failure: Secondary | ICD-10-CM | POA: Diagnosis not present

## 2018-03-03 DIAGNOSIS — E1165 Type 2 diabetes mellitus with hyperglycemia: Secondary | ICD-10-CM | POA: Diagnosis not present

## 2018-03-03 DIAGNOSIS — M1991 Primary osteoarthritis, unspecified site: Secondary | ICD-10-CM | POA: Diagnosis not present

## 2018-03-03 DIAGNOSIS — I69320 Aphasia following cerebral infarction: Secondary | ICD-10-CM | POA: Diagnosis not present

## 2018-03-03 DIAGNOSIS — I050 Rheumatic mitral stenosis: Secondary | ICD-10-CM | POA: Diagnosis not present

## 2018-03-03 DIAGNOSIS — E1122 Type 2 diabetes mellitus with diabetic chronic kidney disease: Secondary | ICD-10-CM | POA: Diagnosis not present

## 2018-03-03 DIAGNOSIS — Z79899 Other long term (current) drug therapy: Secondary | ICD-10-CM | POA: Diagnosis not present

## 2018-03-03 DIAGNOSIS — Z794 Long term (current) use of insulin: Secondary | ICD-10-CM | POA: Insufficient documentation

## 2018-03-03 DIAGNOSIS — Z7901 Long term (current) use of anticoagulants: Secondary | ICD-10-CM | POA: Diagnosis not present

## 2018-03-03 DIAGNOSIS — I509 Heart failure, unspecified: Secondary | ICD-10-CM | POA: Diagnosis not present

## 2018-03-03 DIAGNOSIS — I130 Hypertensive heart and chronic kidney disease with heart failure and stage 1 through stage 4 chronic kidney disease, or unspecified chronic kidney disease: Secondary | ICD-10-CM | POA: Diagnosis not present

## 2018-03-03 DIAGNOSIS — I2510 Atherosclerotic heart disease of native coronary artery without angina pectoris: Secondary | ICD-10-CM | POA: Diagnosis not present

## 2018-03-03 DIAGNOSIS — R739 Hyperglycemia, unspecified: Secondary | ICD-10-CM

## 2018-03-03 DIAGNOSIS — I69393 Ataxia following cerebral infarction: Secondary | ICD-10-CM | POA: Diagnosis not present

## 2018-03-03 DIAGNOSIS — I5030 Unspecified diastolic (congestive) heart failure: Secondary | ICD-10-CM | POA: Diagnosis not present

## 2018-03-03 DIAGNOSIS — N183 Chronic kidney disease, stage 3 (moderate): Secondary | ICD-10-CM | POA: Diagnosis not present

## 2018-03-03 DIAGNOSIS — I2721 Secondary pulmonary arterial hypertension: Secondary | ICD-10-CM | POA: Diagnosis not present

## 2018-03-03 LAB — CBC
HEMATOCRIT: 41.3 % (ref 35.0–47.0)
Hemoglobin: 14.4 g/dL (ref 12.0–16.0)
MCH: 31.3 pg (ref 26.0–34.0)
MCHC: 34.9 g/dL (ref 32.0–36.0)
MCV: 89.9 fL (ref 80.0–100.0)
Platelets: 201 10*3/uL (ref 150–440)
RBC: 4.59 MIL/uL (ref 3.80–5.20)
RDW: 12.4 % (ref 11.5–14.5)
WBC: 6.5 10*3/uL (ref 3.6–11.0)

## 2018-03-03 LAB — GLUCOSE, CAPILLARY
GLUCOSE-CAPILLARY: 541 mg/dL — AB (ref 65–99)
GLUCOSE-CAPILLARY: 287 mg/dL — AB (ref 65–99)
Glucose-Capillary: 572 mg/dL (ref 65–99)
Glucose-Capillary: 557 mg/dL (ref 65–99)

## 2018-03-03 LAB — COMPREHENSIVE METABOLIC PANEL
ALT: 14 U/L (ref 14–54)
AST: 21 U/L (ref 15–41)
Albumin: 3.7 g/dL (ref 3.5–5.0)
Alkaline Phosphatase: 104 U/L (ref 38–126)
Anion gap: 15 (ref 5–15)
BUN: 23 mg/dL — AB (ref 6–20)
CHLORIDE: 89 mmol/L — AB (ref 101–111)
CO2: 26 mmol/L (ref 22–32)
CREATININE: 1.62 mg/dL — AB (ref 0.44–1.00)
Calcium: 9.5 mg/dL (ref 8.9–10.3)
GFR calc Af Amer: 35 mL/min — ABNORMAL LOW (ref >60)
GFR, EST NON AFRICAN AMERICAN: 30 mL/min — AB (ref >60)
Glucose, Bld: 553 mg/dL (ref 65–99)
Potassium: 4.1 mmol/L (ref 3.5–5.1)
Sodium: 130 mmol/L — ABNORMAL LOW (ref 135–145)
Total Bilirubin: 0.8 mg/dL (ref 0.3–1.2)
Total Protein: 7.7 g/dL (ref 6.5–8.1)

## 2018-03-03 MED ORDER — INSULIN REGULAR HUMAN 100 UNIT/ML IJ SOLN: INTRAMUSCULAR | 0 refills | Status: DC

## 2018-03-03 MED ORDER — SODIUM CHLORIDE 0.9 % IV BOLUS
1000.0000 mL | Freq: Once | INTRAVENOUS | Status: AC
  Administered 2018-03-03: 1000 mL via INTRAVENOUS

## 2018-03-03 MED ORDER — INSULIN ASPART 100 UNIT/ML ~~LOC~~ SOLN
10.0000 [IU] | Freq: Once | SUBCUTANEOUS | Status: AC
  Administered 2018-03-03: 10 [IU] via INTRAVENOUS
  Filled 2018-03-03: qty 1

## 2018-03-03 MED ORDER — INSULIN REGULAR HUMAN 100 UNIT/ML IJ SOLN: INTRAMUSCULAR | 3 refills | Status: DC

## 2018-03-03 NOTE — Telephone Encounter (Signed)
Noted and agree to ED evaluation for treatment.

## 2018-03-03 NOTE — ED Triage Notes (Signed)
States blood sugars having been running 500s x 4 days.

## 2018-03-03 NOTE — Telephone Encounter (Signed)
Patient recently had increase in insulin to high blood sugars. Patient reports blood sugar this am was 561. Patient denies any symptoms she is awake and alert. Patient was  Advised to proceed to Er per protocall .Office notified.  Reason for Disposition . Blood glucose > 500 mg/dl (27.5 mmol/l)  Answer Assessment - Initial Assessment Questions 1. BLOOD GLUCOSE: "What is your blood glucose level?"      561   2. ONSET: "When did you check the blood glucose?"     2  Hours   3. USUAL RANGE: "What is your glucose level usually?" (e.g., usual fasting morning value, usual evening value)      Recently  Has been flucuating between 400 -  500   4. KETONES: "Do you check for ketones (urine or blood test strips)?" If yes, ask: "What does the test show now?"      NO 5. TYPE 1 or 2:  "Do you know what type of diabetes you have?"  (e.g., Type 1, Type 2, Gestational; doesn't know)        Type 2   6. INSULIN: "Do you take insulin?" If yes, ask: "Have you missed any shots recently?"      Recently  increased lantus to 35  Units  - Two days  Ago   7. DIABETES PILLS: "Do you take any pills for your diabetes?" If yes, ask: "Have you missed taking any pills recently?"        No 8. OTHER SYMPTOMS: "Do you have any symptoms?" (e.g., fever, frequent urination, difficulty breathing, dizziness, weakness, vomiting)      NO   9. PREGNANCY: "Is there any chance you are pregnant?" "When was your last menstrual period?"     N/A  Protocols used: DIABETES - HIGH BLOOD SUGAR-A-AH

## 2018-03-03 NOTE — ED Notes (Signed)
Pt ambulated to toilet without difficulty

## 2018-03-03 NOTE — ED Provider Notes (Signed)
[facility] Emergency Department Provider Note  ___________________________________________   First MD Initiated Contact with Patient 03/03/18 1405     (approximate)  I have reviewed the triage vital signs and the nursing notes.   HISTORY  Chief Complaint Hyperglycemia   HPI Megan Obrien is a 72 y.o. female history of diabetes on Lantus 35 units each night was presented to the emergency department with persistent and worsening hyperglycemia.  She says that she initially was on 25 units of Lantus about a month ago and they have been slowly increasing her insulin at her doctor's office.  She says that despite the increasing levels of her insulin at her doctor's office that she is persistently having elevated blood sugars that are now in the 500s.  She says that she was taken off metformin and is not on any other injectable or oral insulins.  Past Medical History:  Diagnosis Date  . (HFpEF) heart failure with preserved ejection fraction ([place])    a. 03/2017 Echo: EF 55-60%, no rwma, Gr1 DD, mild to mod MS, mod to sev dil LA.  [name] Arthritis   . Carotid arterial disease ([place])    a. 11/2017 CTA Head/Neck: RICA 45, LICA 60.  [name] Cerebrovascular disease    a. 11/2017 MRA: mod to sev stenosis of bilat PCA's P2 segments; b. 11/2017 CTA Head/Neck: RICA 45, LICA 60, sev bilat distal MCA branch stenoses w/ sev bilat P2 stenoses.  . CKD (chronic kidney disease), stage III ([place])   . Depression   . GERD (gastroesophageal reflux disease)   . Hyperlipidemia   . Hypertension   . Left Atrial Appendage Thrombus    a. 11/2017 TEE (performed in setting of stroke/aphasia): nl EF, mod MS, dil LA w/ heavy smoke and small LAA thrombus-->Warfarin initiated.  . Mitral stenosis    a. Mild to moderate by echo 03/2017; b. 03/2017 R/LHC: PCWP 74mmHg, LVEDP 37mmHg. Mean grad of 45mmHg. Valve area of 2.02cm^2; c. 11/2017 TEE: Mod MS.  . Morbid obesity ([place])   . Murmur   . Non-obstructive CAD  (coronary artery disease)    a. 03/2017 Cath: mild, non-obstructive CAD.  [name] OSA (obstructive sleep apnea)    a. Previously wore CPAP for several years but stopped on her own and now just uses O2 via [place] @ night.  [name] PAH (pulmonary artery hypertension) ([place])    a. 03/2017 RHC: PA 13mmHg.  . Seasonal allergies   . Stroke [facility])    a. 11/2017 - presented w/ aphasia->MRI acute to early subacute ischemia w/in multiple vascular territories, largest in R frontal & L parietal lobes. Smaller foci of ischemia w/in R hippocampus, R parietal lobe, and L peripheral postcentral gyrus.  . Type 2 diabetes mellitus ([place])   . Urinary incontinence     Patient Active Problem List   Diagnosis Date Noted  . Vaginal bleeding 02/06/2018  . Expressive aphasia 12/18/2017  . Stroke ([place]) 12/18/2017  . Mitral valve disease   . Exertional dyspnea 04/17/2017  . Fatigue 02/20/2017  . 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 Medicare annual wellness visit, subsequent 07/24/2016  . Hyperlipidemia 02/06/2016  . Essential hypertension 02/06/2016  . Type 2 diabetes mellitus with complication, without long-term current use of insulin ([place]) 02/06/2016  . Depression 02/06/2016  . GERD (gastroesophageal reflux disease) 02/06/2016  . Urge incontinence of urine 02/06/2016    Past Surgical History:  Procedure Laterality Date  . BACK SURGERY    . CARDIAC CATHETERIZATION    . KNEE  SURGERY Left   . RIGHT/LEFT HEART CATH AND CORONARY ANGIOGRAPHY Bilateral 04/25/2017   Procedure: Right/Left Heart Cath and Coronary Angiography;  Surgeon: Wellington Hampshire, MD;  Location: [facility];  Service: Cardiovascular;  Laterality: Bilateral;  . TEE WITHOUT CARDIOVERSION N/A 12/20/2017   Procedure: TRANSESOPHAGEAL ECHOCARDIOGRAM (TEE);  Surgeon: Wellington Hampshire, MD;  Location: [facility] ORS;  Service: Cardiovascular;  Laterality: N/A;  . [place]    Prior to Admission medications   Medication Sig Start Date End Date  Taking? Authorizing Provider  acetaminophen (TYLENOL) 650 MG CR tablet Take 650 mg by mouth every 8 (eight) hours as needed for pain.     [provider]  amLODipine (NORVASC) 5 MG tablet Take 1 tablet by mouth daily for blood pressure. 02/20/18   Pleas Koch, NP  apixaban (ELIQUIS) 5 MG TABS tablet Take 1 tablet (5 mg total) by mouth 2 (two) times daily. 02/20/18   Pleas Koch, NP  atorvastatin (LIPITOR) 40 MG tablet Take 1 tablet (40 mg total) by mouth at bedtime. 02/19/18   Pleas Koch, NP  buPROPion (WELLBUTRIN SR) 150 MG 12 hr tablet Take 150 mg by mouth 2 (two) times daily.    [provider]  carvedilol (COREG) 12.5 MG tablet Take 18.75 mg by mouth 2 (two) times daily.    [provider]  Cholecalciferol (VITAMIN D3) 2000 units CHEW Chew by mouth. Once a day    [provider]  ferrous sulfate 325 (65 FE) MG tablet Take 325 mg by mouth daily.    [provider]  FLUoxetine (PROZAC) 40 MG capsule Take 1 capsule by mouth once daily for depression and anxiety. 02/20/18   Pleas Koch, NP  fluticasone (FLONASE) 50 MCG/ACT nasal spray Place 1 spray into both nostrils daily. Patient not taking: Reported on 01/28/2018 02/21/17   Pleas Koch, NP  furosemide (LASIX) 40 MG tablet Take 40 mg by mouth daily.    [provider]  Insulin Glargine (LANTUS) 100 UNIT/ML Solostar Pen Inject 35 Units into the skin every evening. 02/28/18   Pleas Koch, NP  Insulin Pen Needle 32G X 6 MM MISC Use as instructed to inject Lantus daily. Dx is E11.8 02/14/18   Pleas Koch, NP  lisinopril (PRINIVIL,ZESTRIL) 40 MG tablet Take 40 mg by mouth daily.    [provider]  Melatonin 10 MG TABS Take 10 mg by mouth at bedtime.    [provider]  omega-3 acid ethyl esters (LOVAZA) 1 g capsule Take 1 capsule by mouth daily with food for cholesterol. 02/20/18   Pleas Koch, NP  omeprazole (PRILOSEC) 40 MG  capsule Take 40 mg by mouth daily.    [provider]  oxybutynin (DITROPAN-XL) 5 MG 24 hr tablet Take 5 mg by mouth 3 (three) times daily.    [provider]  triamcinolone cream (KENALOG) 0.1 % Apply 1 application topically 2 (two) times daily. Patient not taking: Reported on 02/06/2018 08/30/16   Pleas Koch, NP    Allergies Patient has no known allergies.  Family History  Problem Relation Age of Onset  . Lung cancer Father   . Alzheimer's disease Mother   . Stroke Maternal Grandfather     Social History Social History   Tobacco Use  . Smoking status: Never Smoker  . Smokeless tobacco: Never Used  Substance Use Topics  . Alcohol use: No    Alcohol/week: 0.0 oz  Comment: rarely  . Drug use: No    Review of Systems  Constitutional: No fever/chills Eyes: No visual changes. ENT: No sore throat. Cardiovascular: Denies chest pain. Respiratory: Denies shortness of breath. Gastrointestinal: No abdominal pain.  No nausea, no vomiting.  No diarrhea.  No constipation. Genitourinary: Negative for dysuria. Musculoskeletal: Negative for back pain. Skin: Negative for rash. Neurological: Negative for headaches, focal weakness or numbness.   ____________________________________________   PHYSICAL EXAM:  VITAL SIGNS: ED Triage Vitals [03/03/18 1247]  Enc Vitals Group     BP (!) 158/77     Pulse Rate 64     Resp 20     Temp 97.8 F (36.6 C)     Temp Source Oral     SpO2 94 %     Weight 265 lb (120.2 kg)     Height 5' 6.5" (1.689 m)     Head Circumference      Peak Flow      Pain Score 0     Pain Loc      Pain Edu?      Excl. in [place]?     Constitutional: Alert and oriented. Well appearing and in no acute distress. Eyes: Conjunctivae are normal.  Head: Atraumatic. Nose: No congestion/rhinnorhea. Mouth/Throat: Mucous membranes are moist.  Neck: No stridor.   Cardiovascular: Normal rate, regular rhythm. Grossly normal heart  sounds. Respiratory: Normal respiratory effort.  No retractions. Lungs CTAB. Gastrointestinal: Soft and nontender. No distention.  Musculoskeletal: No lower extremity tenderness nor edema.  No joint effusions. Neurologic:  Normal speech and language. No gross focal neurologic deficits are appreciated. Skin:  Skin is warm, dry and intact. No rash noted. Psychiatric: Mood and affect are normal. Speech and behavior are normal.  ____________________________________________   LABS (all labs ordered are listed, but only abnormal results are displayed)  Labs Reviewed  COMPREHENSIVE METABOLIC PANEL - Abnormal; Notable for the following components:      Result Value   Sodium 130 (*)    Chloride 89 (*)    Glucose, Bld 553 (*)    BUN 23 (*)    Creatinine, Ser 1.62 (*)    GFR calc non Af Amer 30 (*)    GFR calc Af Amer 35 (*)    All other components within normal limits  GLUCOSE, CAPILLARY - Abnormal; Notable for the following components:   Glucose-Capillary 572 (*)    All other components within normal limits  GLUCOSE, CAPILLARY - Abnormal; Notable for the following components:   Glucose-Capillary 557 (*)    All other components within normal limits  GLUCOSE, CAPILLARY - Abnormal; Notable for the following components:   Glucose-Capillary 541 (*)    All other components within normal limits  GLUCOSE, CAPILLARY - Abnormal; Notable for the following components:   Glucose-Capillary 287 (*)    All other components within normal limits  CBC  URINALYSIS, COMPLETE (UACMP) WITH MICROSCOPIC  CBG MONITORING, ED   ____________________________________________  EKG   ____________________________________________  RADIOLOGY   ____________________________________________   PROCEDURES  Procedure(s) performed:   Procedures  Critical Care performed:   ____________________________________________   INITIAL IMPRESSION / ASSESSMENT AND PLAN / ED COURSE  Pertinent labs & imaging  results that were available during my care of the patient were reviewed by me and considered in my medical decision making (see chart for details).  DDX: Hyperglycemia, medication noncompliance, resistant diabetes mellitus, DKA As part of my medical decision making, I reviewed the following data within the electronic medical  record:  Old chart reviewed  ----------------------------------------- 6:34 PM on 03/03/2018 -----------------------------------------  After 10 units of aspart the patient's blood sugar is now 287.  I discussed the case with the internal medicine admitting doctor, Dr. Verdell Carmine, who recommends adding 5 units 3 times daily of regular insulin prior to meals in addition to the Lantus.  I discussed this plan with the patient.  She is understanding and willing to comply.  She will be following up with her primary care doctor as well for further medication monitoring.  BUN and creatinine appear to be at baseline. ____________________________________________   FINAL CLINICAL IMPRESSION(S) / ED DIAGNOSES  Hyperglycemia.  NEW MEDICATIONS STARTED DURING THIS VISIT:  New Prescriptions   No medications on file     Note:  This document was prepared using Dragon voice recognition software and may include unintentional dictation errors.     Orbie Pyo, MD 03/03/18 [phone]

## 2018-03-04 ENCOUNTER — Ambulatory Visit: Payer: Self-pay | Admitting: *Deleted

## 2018-03-04 ENCOUNTER — Other Ambulatory Visit: Payer: Self-pay | Admitting: *Deleted

## 2018-03-04 ENCOUNTER — Telehealth: Payer: Self-pay

## 2018-03-04 ENCOUNTER — Encounter: Payer: Self-pay | Admitting: *Deleted

## 2018-03-04 DIAGNOSIS — I69393 Ataxia following cerebral infarction: Secondary | ICD-10-CM | POA: Diagnosis not present

## 2018-03-04 DIAGNOSIS — I5030 Unspecified diastolic (congestive) heart failure: Secondary | ICD-10-CM | POA: Diagnosis not present

## 2018-03-04 DIAGNOSIS — I2510 Atherosclerotic heart disease of native coronary artery without angina pectoris: Secondary | ICD-10-CM | POA: Diagnosis not present

## 2018-03-04 DIAGNOSIS — Z794 Long term (current) use of insulin: Secondary | ICD-10-CM | POA: Diagnosis not present

## 2018-03-04 DIAGNOSIS — Z7901 Long term (current) use of anticoagulants: Secondary | ICD-10-CM | POA: Diagnosis not present

## 2018-03-04 DIAGNOSIS — I050 Rheumatic mitral stenosis: Secondary | ICD-10-CM | POA: Diagnosis not present

## 2018-03-04 DIAGNOSIS — M1991 Primary osteoarthritis, unspecified site: Secondary | ICD-10-CM | POA: Diagnosis not present

## 2018-03-04 DIAGNOSIS — E1122 Type 2 diabetes mellitus with diabetic chronic kidney disease: Secondary | ICD-10-CM | POA: Diagnosis not present

## 2018-03-04 DIAGNOSIS — I130 Hypertensive heart and chronic kidney disease with heart failure and stage 1 through stage 4 chronic kidney disease, or unspecified chronic kidney disease: Secondary | ICD-10-CM | POA: Diagnosis not present

## 2018-03-04 DIAGNOSIS — I69320 Aphasia following cerebral infarction: Secondary | ICD-10-CM | POA: Diagnosis not present

## 2018-03-04 DIAGNOSIS — I2721 Secondary pulmonary arterial hypertension: Secondary | ICD-10-CM | POA: Diagnosis not present

## 2018-03-04 DIAGNOSIS — N183 Chronic kidney disease, stage 3 (moderate): Secondary | ICD-10-CM | POA: Diagnosis not present

## 2018-03-04 NOTE — Telephone Encounter (Signed)
Approved.  

## 2018-03-04 NOTE — Patient Outreach (Signed)
[facility]) Care Management [place] Telephone Outreach  03/04/2018  BRIANI MAUL 07-26-1946 [id number]  Successful telephone outreach to [name], 72 y/o female referred to [facility] after recent hospitalization March 19-25, 2019 for CVA with expressive aphasia; patient was discharged to SNF for rehabilitation and was discharged from [facility] on January 14, 2018.  Patient has history including, but not limited to, DM; HTN/ HLD; GERD; mitral valve disease.  HIPAA/ identity verified with patient during phone call today.  Today, [place] reports that she is "doing better," and acknowledges that she had ED visit yesterday for "these high blood sugars."  Patient denies pain, new/ recent falls, and is able to accurately verbalize new instructions for insulin administration at home, and confirms that she has all of her medications and is taking as prescribed.  Patient further reports:  -- home health services remain active; states that home health nurse visited with her today and has arranged for her to receive diabetic education at home; reports active participation with all home health disciplines -- checking blood sugars tid-- reports "all my sugars have remained high, mostly above 500;" encouraged patient to continue contacting her providers promptly when her sugars run high or low-- patient reports that her sugars "never run low."  Patient was encouraged to continue monitoring and recording blood sugars tid or more often, as indicated, and she verbalizes understanding and agreement -- patient has a hired Tourist information centre manager" who provides transportation to provider appointments-- patient confirms that she plans to attend scheduled cardiology office visit next week -- continues using walker "all the time" for fall prevention-- positive reinforcement provided  Patient denies further issues, concerns, or problems today.  I provided/ confirmed that patient has my  direct phone number, the main [facility] CM office phone number, and the [facility] CM 24-hour nurse advice phone number should issues arise prior to next scheduled [place] outreach.  Encouraged patient to contact me directly if needs, questions, issues, or concerns arise prior to next scheduled outreach; patient agreed to do so.  Plan:  Patient will take medications as prescribed and will attend all scheduled provider appointments  Patient will continue actively participating with all home health disciplines as ordered post- SNF discharge  Patient will continue monitoring and recording blood sugars TID, or more often, as indicated  Patient will promptly notify care providers for any new concerns/ issues/ or problems that arise  [place] CM outreach to continue with scheduled phone call in 2 weeks   [facility] CM Care Plan Problem Two     Most Recent Value  Care Plan Problem Two  Diabetes- elevated sugars   Role Documenting the Problem Two  Care Management [place] for Problem Two  Active  Interventions for Problem Two Long Term Goal   Discussed with patient her current clinical status, yesterday's ED visit and revewed ED discharge instructions with patient,  using teach back method, confirmed that patient is able to verbalize new instructions for insulin and plans to attend upcoming provider appointments  [facility] Long Term Goal  Improvement in self management of diabetes seen in the next 60 days   [facility] Long Term Goal Start Date  02/20/18  [facility] CM Short Term Goal #1   Re established pt would see a decrease in sugars in the next 30 days   [facility] CM Short Term Goal #1 Start Date  02/20/18   Interventions for Short Term Goal #2   Discussed with  patient recent blood sugars at home,  encouraged patient to contact care providers promptly for any new concerns that arise or for ongoing high/ low blood sugars      Oneta Rack, RN, BSN, [place] Coordinator [facility] Care  Management  [phone]

## 2018-03-04 NOTE — Telephone Encounter (Signed)
Copied from [place] [phone]. Topic: Inquiry >> Mar 04, 2018 12:44 PM Pricilla Handler wrote: Reason for CRM: Merry Proud with [facility] [phone]) called requesting verbal orders for Diabetic Teaching. Merry Proud has requested Diabetic Teaching Twice a week for 6 Weeks. Please call Merry Proud at [phone].       Thank You!!!

## 2018-03-04 NOTE — Telephone Encounter (Signed)
Spoken and notified Merry Proud of Tawni Millers approval

## 2018-03-07 ENCOUNTER — Telehealth: Payer: Self-pay | Admitting: Primary Care

## 2018-03-07 DIAGNOSIS — Z794 Long term (current) use of insulin: Secondary | ICD-10-CM | POA: Diagnosis not present

## 2018-03-07 DIAGNOSIS — I69320 Aphasia following cerebral infarction: Secondary | ICD-10-CM | POA: Diagnosis not present

## 2018-03-07 DIAGNOSIS — E1122 Type 2 diabetes mellitus with diabetic chronic kidney disease: Secondary | ICD-10-CM | POA: Diagnosis not present

## 2018-03-07 DIAGNOSIS — I69393 Ataxia following cerebral infarction: Secondary | ICD-10-CM | POA: Diagnosis not present

## 2018-03-07 DIAGNOSIS — I5030 Unspecified diastolic (congestive) heart failure: Secondary | ICD-10-CM | POA: Diagnosis not present

## 2018-03-07 DIAGNOSIS — I050 Rheumatic mitral stenosis: Secondary | ICD-10-CM | POA: Diagnosis not present

## 2018-03-07 DIAGNOSIS — Z7901 Long term (current) use of anticoagulants: Secondary | ICD-10-CM | POA: Diagnosis not present

## 2018-03-07 DIAGNOSIS — M1991 Primary osteoarthritis, unspecified site: Secondary | ICD-10-CM | POA: Diagnosis not present

## 2018-03-07 DIAGNOSIS — I130 Hypertensive heart and chronic kidney disease with heart failure and stage 1 through stage 4 chronic kidney disease, or unspecified chronic kidney disease: Secondary | ICD-10-CM | POA: Diagnosis not present

## 2018-03-07 DIAGNOSIS — I2510 Atherosclerotic heart disease of native coronary artery without angina pectoris: Secondary | ICD-10-CM | POA: Diagnosis not present

## 2018-03-07 DIAGNOSIS — I2721 Secondary pulmonary arterial hypertension: Secondary | ICD-10-CM | POA: Diagnosis not present

## 2018-03-07 DIAGNOSIS — N183 Chronic kidney disease, stage 3 (moderate): Secondary | ICD-10-CM | POA: Diagnosis not present

## 2018-03-07 MED ORDER — INSULIN REGULAR HUMAN 100 UNIT/ML IJ SOLN: INTRAMUSCULAR | 3 refills | Status: DC

## 2018-03-07 NOTE — Telephone Encounter (Signed)
Will you please call and check on blood sugar readings since she was in the ED? She was also put on a new regimen of insulin, will you update this in the system? Is she able to afford the fast acting insulin, she notified me last week that the fast acting insulin was too expensive.

## 2018-03-07 NOTE — Telephone Encounter (Signed)
Spoke to pt who states her BS "started in the 500s at breakfast and was in the 400s lunchtime and afterwards." She states that she "guess its affordable" since its been picked up from the pharmacy already

## 2018-03-07 NOTE — Telephone Encounter (Signed)
Attempted to contact pt; line has been busy x10 min

## 2018-03-07 NOTE — Telephone Encounter (Signed)
Have her increase her new/fast acting insulin Novolin R to 10 units before breakfast, lunch, and dinner. Hold if blood sugars are below 100. Continue Lantus at 35 units. Continue monitoring blood glucose three times daily, we will call for readings next week.

## 2018-03-07 NOTE — Telephone Encounter (Signed)
Spoke with patient.  She was able to verbalize back correct dosing instructions and will check sugars as ordered and call us next week with an update.

## 2018-03-10 DIAGNOSIS — I130 Hypertensive heart and chronic kidney disease with heart failure and stage 1 through stage 4 chronic kidney disease, or unspecified chronic kidney disease: Secondary | ICD-10-CM | POA: Diagnosis not present

## 2018-03-10 DIAGNOSIS — M1991 Primary osteoarthritis, unspecified site: Secondary | ICD-10-CM | POA: Diagnosis not present

## 2018-03-10 DIAGNOSIS — N183 Chronic kidney disease, stage 3 (moderate): Secondary | ICD-10-CM | POA: Diagnosis not present

## 2018-03-10 DIAGNOSIS — I2510 Atherosclerotic heart disease of native coronary artery without angina pectoris: Secondary | ICD-10-CM | POA: Diagnosis not present

## 2018-03-10 DIAGNOSIS — I2721 Secondary pulmonary arterial hypertension: Secondary | ICD-10-CM | POA: Diagnosis not present

## 2018-03-10 DIAGNOSIS — Z7901 Long term (current) use of anticoagulants: Secondary | ICD-10-CM | POA: Diagnosis not present

## 2018-03-10 DIAGNOSIS — E1122 Type 2 diabetes mellitus with diabetic chronic kidney disease: Secondary | ICD-10-CM | POA: Diagnosis not present

## 2018-03-10 DIAGNOSIS — Z794 Long term (current) use of insulin: Secondary | ICD-10-CM | POA: Diagnosis not present

## 2018-03-10 DIAGNOSIS — I050 Rheumatic mitral stenosis: Secondary | ICD-10-CM | POA: Diagnosis not present

## 2018-03-10 DIAGNOSIS — I69320 Aphasia following cerebral infarction: Secondary | ICD-10-CM | POA: Diagnosis not present

## 2018-03-10 DIAGNOSIS — I69393 Ataxia following cerebral infarction: Secondary | ICD-10-CM | POA: Diagnosis not present

## 2018-03-10 DIAGNOSIS — I5030 Unspecified diastolic (congestive) heart failure: Secondary | ICD-10-CM | POA: Diagnosis not present

## 2018-03-11 ENCOUNTER — Other Ambulatory Visit
Admission: RE | Admit: 2018-03-11 | Discharge: 2018-03-11 | Disposition: A | Discharge status: Home / Self Care | Payer: Medicare Other | Source: Ambulatory Visit | Attending: Physician Assistant | Admitting: Physician Assistant

## 2018-03-11 ENCOUNTER — Ambulatory Visit (INDEPENDENT_AMBULATORY_CARE_PROVIDER_SITE_OTHER): Payer: Medicare Other

## 2018-03-11 ENCOUNTER — Telehealth: Payer: Self-pay | Admitting: Physician Assistant

## 2018-03-11 ENCOUNTER — Encounter: Payer: Self-pay | Admitting: Physician Assistant

## 2018-03-11 ENCOUNTER — Ambulatory Visit: Payer: Medicare Other | Admitting: Physician Assistant

## 2018-03-11 VITALS — BP 100/68 | HR 64 | Ht 66.5 in | Wt 254.0 lb

## 2018-03-11 DIAGNOSIS — I272 Pulmonary hypertension, unspecified: Secondary | ICD-10-CM | POA: Diagnosis not present

## 2018-03-11 DIAGNOSIS — I050 Rheumatic mitral stenosis: Secondary | ICD-10-CM | POA: Diagnosis not present

## 2018-03-11 DIAGNOSIS — I10 Essential (primary) hypertension: Secondary | ICD-10-CM | POA: Diagnosis not present

## 2018-03-11 DIAGNOSIS — G4733 Obstructive sleep apnea (adult) (pediatric): Secondary | ICD-10-CM

## 2018-03-11 DIAGNOSIS — N183 Chronic kidney disease, stage 3 unspecified: Secondary | ICD-10-CM

## 2018-03-11 DIAGNOSIS — I480 Paroxysmal atrial fibrillation: Secondary | ICD-10-CM

## 2018-03-11 DIAGNOSIS — N939 Abnormal uterine and vaginal bleeding, unspecified: Secondary | ICD-10-CM

## 2018-03-11 DIAGNOSIS — E119 Type 2 diabetes mellitus without complications: Secondary | ICD-10-CM

## 2018-03-11 DIAGNOSIS — Z8673 Personal history of transient ischemic attack (TIA), and cerebral infarction without residual deficits: Secondary | ICD-10-CM | POA: Diagnosis not present

## 2018-03-11 DIAGNOSIS — I513 Intracardiac thrombosis, not elsewhere classified: Secondary | ICD-10-CM | POA: Diagnosis not present

## 2018-03-11 DIAGNOSIS — Z79899 Other long term (current) drug therapy: Secondary | ICD-10-CM

## 2018-03-11 DIAGNOSIS — Z794 Long term (current) use of insulin: Secondary | ICD-10-CM | POA: Diagnosis not present

## 2018-03-11 DIAGNOSIS — IMO0001 Reserved for inherently not codable concepts without codable children: Secondary | ICD-10-CM

## 2018-03-11 LAB — CBC WITH DIFFERENTIAL/PLATELET
BASOS ABS: 0 10*3/uL (ref 0–0.1)
Basophils Relative: 0 %
EOS ABS: 0.2 10*3/uL (ref 0–0.7)
EOS PCT: 3 %
HCT: 40.3 % (ref 35.0–47.0)
Hemoglobin: 13.9 g/dL (ref 12.0–16.0)
Lymphocytes Relative: 14 %
Lymphs Abs: 0.9 10*3/uL — ABNORMAL LOW (ref 1.0–3.6)
MCH: 31.3 pg (ref 26.0–34.0)
MCHC: 34.6 g/dL (ref 32.0–36.0)
MCV: 90.5 fL (ref 80.0–100.0)
Monocytes Absolute: 0.4 10*3/uL (ref 0.2–0.9)
Monocytes Relative: 7 %
Neutro Abs: 4.8 10*3/uL (ref 1.4–6.5)
Neutrophils Relative %: 76 %
Platelets: 183 10*3/uL (ref 150–440)
RBC: 4.46 MIL/uL (ref 3.80–5.20)
RDW: 12.7 % (ref 11.5–14.5)
WBC: 6.4 10*3/uL (ref 3.6–11.0)

## 2018-03-11 LAB — BASIC METABOLIC PANEL
ANION GAP: 11 (ref 5–15)
BUN: 25 mg/dL — AB (ref 6–20)
CO2: 27 mmol/L (ref 22–32)
Calcium: 9 mg/dL (ref 8.9–10.3)
Chloride: 94 mmol/L — ABNORMAL LOW (ref 101–111)
Creatinine, Ser: 1.44 mg/dL — ABNORMAL HIGH (ref 0.44–1.00)
GFR calc Af Amer: 41 mL/min — ABNORMAL LOW (ref >60)
GFR calc non Af Amer: 35 mL/min — ABNORMAL LOW (ref >60)
Glucose, Bld: 392 mg/dL — ABNORMAL HIGH (ref 65–99)
POTASSIUM: 3.8 mmol/L (ref 3.5–5.1)
SODIUM: 132 mmol/L — AB (ref 135–145)

## 2018-03-11 MED ORDER — ENOXAPARIN SODIUM 120 MG/0.8ML ~~LOC~~ SOLN: 120.0000 mg | Freq: Two times a day (BID) | SUBCUTANEOUS | 1 refills | Status: DC

## 2018-03-11 MED ORDER — ENOXAPARIN SODIUM 100 MG/ML ~~LOC~~ SOLN: 1.0000 mg/kg | Freq: Two times a day (BID) | SUBCUTANEOUS | 1 refills | Status: DC

## 2018-03-11 NOTE — Patient Instructions (Addendum)
Medication Instructions: - Your physician has recommended you make the following change in your medication:  1) STOP eliquis (if you pick up lovenox tonight, take your 1st dose of this and NO ELIQUIS  2) START lovenox 100 mg/ mL injections- inject 1.15 mL into the skin every 12 hours (1st dose tonight in place of Eliquis)  ** if you unable to get your lovenox injections tonight, for whatever reason, then continue eliquis until you are seen in the coumadin clinic tomorrow   Labwork: - Your physician recommends that you have lab work today: BMP/ CBC   Procedures/Testing: - Your physician has recommended that you wear a 14 day heart monitor (ZIO)  Follow-Up: - Your physician recommends that you schedule a follow-up appointment tomorrow with the Coumadin- Wednesday 03/12/18 at 1:30 pm   - Your physician recommends that you schedule a follow-up appointment in: 1 month with Dr. Okey Regal, PA   Any Additional Special Instructions Will Be Listed Below (If Applicable).     If you need a refill on your cardiac medications before your next appointment, please call your pharmacy.

## 2018-03-11 NOTE — Progress Notes (Addendum)
Cardiology Office Note Date:  03/11/2018  Patient ID:  Megan Obrien, Megan Obrien [birth year], MRN [id number] PCP:  Megan Koch, NP  Cardiologist:  Dr. Fletcher Anon, MD    Chief Complaint: Follow up  History of Present Illness: Megan Obrien is a 72 y.o. 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  Patient underwent right and left cardiac catheterization in the summer 2018 for her mitral stenosis which revealed nonobstructive CAD and a moderately elevated right heart filling pressures with severe pulmonary hypertension.  Following this, she was placed on Lasix and last evaluated in the office and 06/2017.  Patient was admitted to [facility] in 11/2017 with dizziness and expressive a aphasia.  MRI of the brain showed right frontal and left parietal ischemia with smaller foci of ischemia in the right hippocampus, right parietal lobe, and left peripheral post central gyrus.  CTA of the head/neck showed moderate bilateral carotid disease and severe distal MCA branch stenosis with severe bilateral P2 stenoses.  TTE on 12/18/2017 showed moderate LVH, mitral stenosis precluded accurate assessment of diastolic function, severely calcified mitral annulus with moderate stenosis, moderately to severely dilated left atrium, RV systolic function normal, moderately dilated right atrium.  Patient underwent TEE on 12/20/2017 as part of her stroke work-up which showed normal LV systolic function with moderate mitral stenosis, dilated left atrium, heavy smoke throughout the left atrium and a small mobile thrombus in the LAA.  In that setting, Coumadin was initiated with strong presumption of occult PAF.  Since her discharge, she  has been evaluated in the ED on 12/27/2017 for a mechanical fall.  CT of the head showed no acute intracranial pathology.  Cervical spine CT showed no acute osseous injury.  She was most recently seen in the [facility] ED on 03/03/2018 in the setting of hyperglycemia with an initial glucose of 553.  It is unclear who has been managing her INR as there is no evidence of INR being checked and care everywhere and the most recent INR in Epic is from 12/23/2017 at which time she was started on Coumadin as above and noted to be 1.29.  She has not followed up with cardiology since her discharge in 11/2017.  She comes in doing well from a cardiac perspective. She has not noted any chest pain, SOB, palpitations, presyncope or syncope. She had noticed some dizziness in the setting of blood sugars running > 500, though with improved blood sugars her dizziness has resolved. At some point following her discharge in 11/2017 (was discharged on Coumadin given her LAA thrombus with at least moderate mitral stenosis) she was placed on Eliquis by an outside provider or office. She did note some abnormal uterine bleeding following the addition of Eliquis for 1 week, that has since resolved. She follows up with her PCP for this the following week. No falls, BRBPR or melena.   Past Medical History:  Diagnosis Date  . (HFpEF) heart failure with preserved ejection fraction ([place])    a. 03/2017 Echo: EF 55-60%, no rwma, Gr1 DD, mild to mod MS, mod to sev dil LA.  [name] Arthritis   . Carotid arterial disease ([place])    a. 11/2017 CTA Head/Neck: RICA 45, LICA 60.  [name] Cerebrovascular disease  a. 11/2017 MRA: mod to sev stenosis of bilat PCA's P2 segments; b. 11/2017 CTA Head/Neck: RICA 45, LICA 60, sev bilat distal MCA branch stenoses w/ sev bilat P2 stenoses.  . CKD (chronic kidney disease), stage III ([place])   . Depression   . GERD (gastroesophageal reflux disease)   . Hyperlipidemia   . Hypertension   . Left Atrial Appendage Thrombus    a.  11/2017 TEE (performed in setting of stroke/aphasia): nl EF, mod MS, dil LA w/ heavy smoke and small LAA thrombus-->Warfarin initiated.  . Mitral stenosis    a. Mild to moderate by echo 03/2017; b. 03/2017 R/LHC: PCWP 36mmHg, LVEDP 70mmHg. Mean grad of 45mmHg. Valve area of 2.02cm^2; c. 11/2017 TEE: Mod MS.  . Morbid obesity ([place])   . Murmur   . Non-obstructive CAD (coronary artery disease)    a. 03/2017 Cath: mild, non-obstructive CAD.  [name] OSA (obstructive sleep apnea)    a. Previously wore CPAP for several years but stopped on her own and now just uses O2 via [place] @ night.  [name] PAH (pulmonary artery hypertension) ([place])    a. 03/2017 RHC: PA 35mmHg.  . Seasonal allergies   . Stroke [facility])    a. 11/2017 - presented w/ aphasia->MRI acute to early subacute ischemia w/in multiple vascular territories, largest in R frontal & L parietal lobes. Smaller foci of ischemia w/in R hippocampus, R parietal lobe, and L peripheral postcentral gyrus.  . Type 2 diabetes mellitus ([place])   . Urinary incontinence     Past Surgical History:  Procedure Laterality Date  . BACK SURGERY    . CARDIAC CATHETERIZATION    . KNEE SURGERY Left   . RIGHT/LEFT HEART CATH AND CORONARY ANGIOGRAPHY Bilateral 04/25/2017   Procedure: Right/Left Heart Cath and Coronary Angiography;  Surgeon: Wellington Hampshire, MD;  Location: [facility];  Service: Cardiovascular;  Laterality: Bilateral;  . TEE WITHOUT CARDIOVERSION N/A 12/20/2017   Procedure: TRANSESOPHAGEAL ECHOCARDIOGRAM (TEE);  Surgeon: Wellington Hampshire, MD;  Location: [facility] ORS;  Service: Cardiovascular;  Laterality: N/A;  . TONSILLECTOMY AND ADENOIDECTOMY  1952    Current Meds  Medication Sig  . acetaminophen (TYLENOL) 650 MG CR tablet Take 650 mg by mouth every 8 (eight) hours as needed for pain.   [name] amLODipine (NORVASC) 5 MG tablet Take 1 tablet by mouth daily for blood pressure.  [name] atorvastatin (LIPITOR) 40 MG tablet Take 1 tablet (40 mg total) by mouth at bedtime.  [name]  buPROPion (WELLBUTRIN SR) 150 MG 12 hr tablet Take 150 mg by mouth 2 (two) times daily.  . carvedilol (COREG) 12.5 MG tablet Take 18.75 mg by mouth 2 (two) times daily.  . Cholecalciferol (VITAMIN D3) 2000 units CHEW Chew by mouth. Once a day  . ferrous sulfate 325 (65 FE) MG tablet Take 325 mg by mouth daily.  [name] FLUoxetine (PROZAC) 40 MG capsule Take 1 capsule by mouth once daily for depression and anxiety.  . fluticasone (FLONASE) 50 MCG/ACT nasal spray Place 1 spray into both nostrils daily.  . furosemide (LASIX) 40 MG tablet Take 40 mg by mouth daily.  . Insulin Glargine (LANTUS) 100 UNIT/ML Solostar Pen Inject 35 Units into the skin every evening.  . Insulin Pen Needle 32G X 6 MM MISC Use as instructed to inject Lantus daily. Dx is E11.8  . insulin regular (NOVOLIN R,HUMULIN R) 100 units/mL injection Inject 10 units into the skin three times daily before breakfast, lunch, and dinner. Hold for sugars at  or below 100.  [name] lisinopril (PRINIVIL,ZESTRIL) 40 MG tablet Take 40 mg by mouth daily.  . Melatonin 10 MG TABS Take 10 mg by mouth at bedtime.  [name] omega-3 acid ethyl esters (LOVAZA) 1 g capsule Take 1 capsule by mouth daily with food for cholesterol.  [name] omeprazole (PRILOSEC) 40 MG capsule Take 40 mg by mouth daily.  [name] oxybutynin (DITROPAN-XL) 5 MG 24 hr tablet Take 5 mg by mouth 3 (three) times daily.  [name] triamcinolone cream (KENALOG) 0.1 % Apply 1 application topically 2 (two) times daily.  . [DISCONTINUED] apixaban (ELIQUIS) 5 MG TABS tablet Take 1 tablet (5 mg total) by mouth 2 (two) times daily.    Allergies:   Patient has no known allergies.   Social History:  The patient  reports that she has never smoked. She has never used smokeless tobacco. She reports that she does not drink alcohol or use drugs.   Family History:  The patient's family history includes Alzheimer's disease in her mother; Lung cancer in her father; Stroke in her maternal grandfather.  ROS:   Review of Systems    Constitutional: Positive for malaise/fatigue. Negative for chills, diaphoresis, fever and weight loss.  HENT: Negative for congestion.   Eyes: Negative for discharge and redness.  Respiratory: Negative for cough, hemoptysis, sputum production, shortness of breath and wheezing.   Cardiovascular: Negative for chest pain, palpitations, orthopnea, claudication, leg swelling and PND.  Gastrointestinal: Negative for abdominal pain, blood in stool, heartburn, melena, nausea and vomiting.  Genitourinary: Negative for hematuria.  Musculoskeletal: Negative for falls and myalgias.  Skin: Negative for rash.  Neurological: Positive for dizziness, speech change and weakness. Negative for tingling, tremors, sensory change, focal weakness and loss of consciousness.       1) Dizziness improved following improved blood sugars 2) Residual speech change from her stroke in 11/2017  Endo/Heme/Allergies: Does not bruise/bleed easily.  Psychiatric/Behavioral: Negative for substance abuse. The patient is not nervous/anxious.   All other systems reviewed and are negative.    PHYSICAL EXAM:  VS:  BP 100/68 (BP Location: Right Wrist, Patient Position: Sitting, Cuff Size: Normal)   Pulse 64   Ht 5' 6.5" (1.689 m)   Wt 254 lb (115.2 kg)   BMI 40.38 kg/m  BMI: Body mass index is 40.38 kg/m.  Physical Exam  Constitutional: She is oriented to person, place, and time. She appears well-developed and well-nourished.  HENT:  Head: Normocephalic and atraumatic.  Eyes: Right eye exhibits no discharge. Left eye exhibits no discharge.  Neck: Normal range of motion. No JVD present.  Cardiovascular: Normal rate, regular rhythm, S1 normal and S2 normal. Exam reveals no distant heart sounds, no friction rub, no midsystolic click and no opening snap.  Murmur heard.  Low-pitched rumbling crescendo presystolic murmur is present with a grade of 3/6 at the apex. Pulmonary/Chest: Effort normal and breath sounds normal. No  respiratory distress. She has no decreased breath sounds. She has no wheezes. She has no rales. She exhibits no tenderness.  Abdominal: Soft. She exhibits no distension. There is no tenderness.  Musculoskeletal: She exhibits no edema.  Neurological: She is alert and oriented to person, place, and time.  Skin: Skin is warm and dry. No cyanosis. Nails show no clubbing.  Psychiatric: She has a normal mood and affect. Her speech is normal and behavior is normal. Judgment and thought content normal.     EKG:  Was ordered and interpreted by me today. Shows NSR, 64 bpm, left axis  deviation, LVH, poor R wave progression, no acute st/t changes   Recent Labs: 04/17/2017: Pro B Natriuretic peptide (BNP) 372.0; TSH 4.16 03/03/2018: ALT 14; BUN 23; Creatinine, Ser 1.62; Hemoglobin 14.4; Platelets 201; Potassium 4.1; Sodium 130  02/06/2018: Cholesterol 139; HDL 41.10; LDL Cholesterol 68; Total CHOL/HDL Ratio 3; Triglycerides 148.0; VLDL 29.6   Estimated Creatinine Clearance: 40.2 mL/min (A) (by C-G formula based on SCr of 1.62 mg/dL (H)).   Wt Readings from Last 3 Encounters:  03/11/18 254 lb (115.2 kg)  03/03/18 265 lb (120.2 kg)  02/06/18 260 lb 4 oz (118 kg)     Other studies reviewed: Additional studies/records reviewed today include: summarized above  ASSESSMENT AND PLAN:  1. Presumed Afib/LAA thrombus: Patient was discharged from the hospital in 11/2017 on Coumadin for presumed valvular Afib. At some point, she was transitioned from Coumadin to Eliquis by an outside office/provider. She has at least moderate mitral stenosis, making this off label use of a DOAC. I have discussed this in detail with her and her care giver. We have agreed to restart her on Coumadin to be managed by our Coumadin Clinic. She will see our Coumadin Clinic on 6/12 to start Coumadin (patient could not come at a later date to allow for starting of therapy). She has been advised she will need to stop her Eliquis at this time and  start a Lovenox bridge a 1 mg/kg bid with Coumadin (to be dosed at Coumadin Clinic appointment on 6/12). Continue Coreg for rate control. Place a Zio monitor to evaluate for Afib burden. Cannot rule out the possibility of her LAA thrombus stemming from turbulent flow from her mitral stenosis. Check BMP and CBC.   2. Moderate mitral stenosis: Stable. Continue to monitor clinically. Uncertain at this time if the LAA thrombus is related to possible PAF vs turbulent flow from her mitral stenosis. Nonetheless, the patient is at increased of valvular Afib given her mitral stenosis. Because of this, she has been transitioned from Elqiuis back to Coumadin as above. Monitor mitral stenosis with periodic echoes.   3. History of stroke: Stable. Has residual speech impairment. Transition from Eliquis back to Coumadin as above. LDL 64 from 01/2018. Continue Lipitor 40 mg daily.   4. Medication management: Long discussion with patient and care giver regarding transition of Eliquis to Lovenox to Coumadin. All questions and concerns have been answered. Detailed instructions were given to the patient to pick up Lovenox this afternoon and to inject 1 mg/kg in place of her Eliquis. She was strictly advised not to inject Lovenox plus continue Eliquis. She understands this. If for whatever reason she does not pick up Lovenox she will continue Eliquis until she is instructed by the Coumadin Clinic. She is aware that this would be off label use of Eliquis and has accepted this risk.    5. HTN: Blood pressure is on the soft side today, though she is asymptomatic. Continue Coreg 12.5 mg bid, amlodipine 5 mg daily, and lisinopril 40 mg daily.  6. IDDM: Poorly controlled, though improving. Per PCP.   7. PAH/OSA: Nocturnal oxygen. Intolerant of CPAP.   8. CKD stage III: Check BMP.   9. AUB: Check CBC as above. Advised patient to follow up with PCP.    Discussed with Dr. Saunders Revel who agrees with the above.   Disposition: F/u with  Dr. Fletcher Obrien in 2 months and Coumadin Clinic on 03/12/2018.   Current medicines are reviewed at length with the patient today.  The patient  did not have any concerns regarding medicines.  Signed, [name], PA-C 03/11/2018 5:03 PM     [address] [place], [place] [address] [phone]

## 2018-03-11 NOTE — Telephone Encounter (Signed)
Please call to discuss lovenox dosage.  Lovenox order 115 mg and this would be easier to dispense if 100 instead as the syringe starts at 3 attempts to schedule fu appt from recall list.   Deleting recall.  Dosage

## 2018-03-11 NOTE — Telephone Encounter (Signed)
I called and spoke with the pharmacist at [facility].  They state that the way the lovenox syringes are, they will not be able to get the appropriate 115 mg dose for her.  She was questioning if the patient could take 100 mg BID.  I advised I would need to speak with the PA who prescribed the medication and see what dose he would want her to take as the dose based on her weight came in at 115 mg.  Reviewed with Thurmond Butts, [place] & Dr. Saunders Revel.  The decision as made to place the patient on lovenox 120 mg BID. I have called the pharmacist and made her aware of this.  She states they do have lovenox 120 mg syringes in stock.

## 2018-03-12 ENCOUNTER — Ambulatory Visit (INDEPENDENT_AMBULATORY_CARE_PROVIDER_SITE_OTHER): Payer: Medicare Other

## 2018-03-12 DIAGNOSIS — I2721 Secondary pulmonary arterial hypertension: Secondary | ICD-10-CM | POA: Diagnosis not present

## 2018-03-12 DIAGNOSIS — I4891 Unspecified atrial fibrillation: Secondary | ICD-10-CM

## 2018-03-12 DIAGNOSIS — I8290 Acute embolism and thrombosis of unspecified vein: Secondary | ICD-10-CM

## 2018-03-12 DIAGNOSIS — E1122 Type 2 diabetes mellitus with diabetic chronic kidney disease: Secondary | ICD-10-CM | POA: Diagnosis not present

## 2018-03-12 DIAGNOSIS — Z794 Long term (current) use of insulin: Secondary | ICD-10-CM | POA: Diagnosis not present

## 2018-03-12 DIAGNOSIS — Z5181 Encounter for therapeutic drug level monitoring: Secondary | ICD-10-CM | POA: Diagnosis not present

## 2018-03-12 DIAGNOSIS — M1991 Primary osteoarthritis, unspecified site: Secondary | ICD-10-CM | POA: Diagnosis not present

## 2018-03-12 DIAGNOSIS — I2510 Atherosclerotic heart disease of native coronary artery without angina pectoris: Secondary | ICD-10-CM | POA: Diagnosis not present

## 2018-03-12 DIAGNOSIS — I5030 Unspecified diastolic (congestive) heart failure: Secondary | ICD-10-CM | POA: Diagnosis not present

## 2018-03-12 DIAGNOSIS — Z7901 Long term (current) use of anticoagulants: Secondary | ICD-10-CM | POA: Diagnosis not present

## 2018-03-12 DIAGNOSIS — I050 Rheumatic mitral stenosis: Secondary | ICD-10-CM | POA: Diagnosis not present

## 2018-03-12 DIAGNOSIS — I69320 Aphasia following cerebral infarction: Secondary | ICD-10-CM | POA: Diagnosis not present

## 2018-03-12 DIAGNOSIS — N183 Chronic kidney disease, stage 3 (moderate): Secondary | ICD-10-CM | POA: Diagnosis not present

## 2018-03-12 DIAGNOSIS — I69393 Ataxia following cerebral infarction: Secondary | ICD-10-CM | POA: Diagnosis not present

## 2018-03-12 DIAGNOSIS — I130 Hypertensive heart and chronic kidney disease with heart failure and stage 1 through stage 4 chronic kidney disease, or unspecified chronic kidney disease: Secondary | ICD-10-CM | POA: Diagnosis not present

## 2018-03-12 MED ORDER — WARFARIN SODIUM 5 MG PO TABS: 5.0000 mg | ORAL_TABLET | ORAL | 0 refills | Status: DC

## 2018-03-12 NOTE — Addendum Note (Signed)
Addended by: Alvis Lemmings C on: 03/12/2018 08:29 AM   Modules accepted: Orders

## 2018-03-12 NOTE — Patient Instructions (Signed)
Please continue taking Lovenox injections twice daily. Please start taking coumadin (warfarin) 5 mg every evening at 6 pm.  Please return for INR check on Monday, 6/17.

## 2018-03-13 DIAGNOSIS — I69320 Aphasia following cerebral infarction: Secondary | ICD-10-CM | POA: Diagnosis not present

## 2018-03-13 DIAGNOSIS — I2510 Atherosclerotic heart disease of native coronary artery without angina pectoris: Secondary | ICD-10-CM | POA: Diagnosis not present

## 2018-03-13 DIAGNOSIS — I69393 Ataxia following cerebral infarction: Secondary | ICD-10-CM | POA: Diagnosis not present

## 2018-03-13 DIAGNOSIS — N183 Chronic kidney disease, stage 3 (moderate): Secondary | ICD-10-CM | POA: Diagnosis not present

## 2018-03-13 DIAGNOSIS — Z794 Long term (current) use of insulin: Secondary | ICD-10-CM | POA: Diagnosis not present

## 2018-03-13 DIAGNOSIS — I2721 Secondary pulmonary arterial hypertension: Secondary | ICD-10-CM | POA: Diagnosis not present

## 2018-03-13 DIAGNOSIS — Z7901 Long term (current) use of anticoagulants: Secondary | ICD-10-CM | POA: Diagnosis not present

## 2018-03-13 DIAGNOSIS — E1122 Type 2 diabetes mellitus with diabetic chronic kidney disease: Secondary | ICD-10-CM | POA: Diagnosis not present

## 2018-03-13 DIAGNOSIS — I130 Hypertensive heart and chronic kidney disease with heart failure and stage 1 through stage 4 chronic kidney disease, or unspecified chronic kidney disease: Secondary | ICD-10-CM | POA: Diagnosis not present

## 2018-03-13 DIAGNOSIS — I050 Rheumatic mitral stenosis: Secondary | ICD-10-CM | POA: Diagnosis not present

## 2018-03-13 DIAGNOSIS — M1991 Primary osteoarthritis, unspecified site: Secondary | ICD-10-CM | POA: Diagnosis not present

## 2018-03-13 DIAGNOSIS — I5030 Unspecified diastolic (congestive) heart failure: Secondary | ICD-10-CM | POA: Diagnosis not present

## 2018-03-15 DIAGNOSIS — G4730 Sleep apnea, unspecified: Secondary | ICD-10-CM | POA: Diagnosis not present

## 2018-03-17 ENCOUNTER — Ambulatory Visit (INDEPENDENT_AMBULATORY_CARE_PROVIDER_SITE_OTHER): Payer: Medicare Other

## 2018-03-17 DIAGNOSIS — I130 Hypertensive heart and chronic kidney disease with heart failure and stage 1 through stage 4 chronic kidney disease, or unspecified chronic kidney disease: Secondary | ICD-10-CM | POA: Diagnosis not present

## 2018-03-17 DIAGNOSIS — Z794 Long term (current) use of insulin: Secondary | ICD-10-CM | POA: Diagnosis not present

## 2018-03-17 DIAGNOSIS — I2510 Atherosclerotic heart disease of native coronary artery without angina pectoris: Secondary | ICD-10-CM | POA: Diagnosis not present

## 2018-03-17 DIAGNOSIS — I69393 Ataxia following cerebral infarction: Secondary | ICD-10-CM | POA: Diagnosis not present

## 2018-03-17 DIAGNOSIS — I5030 Unspecified diastolic (congestive) heart failure: Secondary | ICD-10-CM | POA: Diagnosis not present

## 2018-03-17 DIAGNOSIS — Z7901 Long term (current) use of anticoagulants: Secondary | ICD-10-CM | POA: Diagnosis not present

## 2018-03-17 DIAGNOSIS — I4891 Unspecified atrial fibrillation: Secondary | ICD-10-CM | POA: Diagnosis not present

## 2018-03-17 DIAGNOSIS — I050 Rheumatic mitral stenosis: Secondary | ICD-10-CM | POA: Diagnosis not present

## 2018-03-17 DIAGNOSIS — I2721 Secondary pulmonary arterial hypertension: Secondary | ICD-10-CM | POA: Diagnosis not present

## 2018-03-17 DIAGNOSIS — N183 Chronic kidney disease, stage 3 (moderate): Secondary | ICD-10-CM | POA: Diagnosis not present

## 2018-03-17 DIAGNOSIS — I8290 Acute embolism and thrombosis of unspecified vein: Secondary | ICD-10-CM | POA: Diagnosis not present

## 2018-03-17 DIAGNOSIS — Z5181 Encounter for therapeutic drug level monitoring: Secondary | ICD-10-CM | POA: Diagnosis not present

## 2018-03-17 DIAGNOSIS — M1991 Primary osteoarthritis, unspecified site: Secondary | ICD-10-CM | POA: Diagnosis not present

## 2018-03-17 DIAGNOSIS — E1122 Type 2 diabetes mellitus with diabetic chronic kidney disease: Secondary | ICD-10-CM | POA: Diagnosis not present

## 2018-03-17 DIAGNOSIS — I69320 Aphasia following cerebral infarction: Secondary | ICD-10-CM | POA: Diagnosis not present

## 2018-03-17 LAB — POCT INR: INR: 3 (ref 2.0–3.0)

## 2018-03-17 NOTE — Patient Instructions (Addendum)
Please STOP LOVENOX INJECTIONS!  ; ) Please START NEW DOSAGE of taking coumadin (warfarin) 5 mg every day except 1/2 tablet on Mondays & Fridays. Please return for INR check in 1 week.

## 2018-03-18 ENCOUNTER — Other Ambulatory Visit: Payer: Self-pay | Admitting: *Deleted

## 2018-03-18 ENCOUNTER — Encounter: Payer: Self-pay | Admitting: *Deleted

## 2018-03-18 DIAGNOSIS — I69320 Aphasia following cerebral infarction: Secondary | ICD-10-CM | POA: Diagnosis not present

## 2018-03-18 DIAGNOSIS — I2721 Secondary pulmonary arterial hypertension: Secondary | ICD-10-CM | POA: Diagnosis not present

## 2018-03-18 DIAGNOSIS — E1122 Type 2 diabetes mellitus with diabetic chronic kidney disease: Secondary | ICD-10-CM | POA: Diagnosis not present

## 2018-03-18 DIAGNOSIS — I69393 Ataxia following cerebral infarction: Secondary | ICD-10-CM | POA: Diagnosis not present

## 2018-03-18 DIAGNOSIS — I5030 Unspecified diastolic (congestive) heart failure: Secondary | ICD-10-CM | POA: Diagnosis not present

## 2018-03-18 DIAGNOSIS — I050 Rheumatic mitral stenosis: Secondary | ICD-10-CM | POA: Diagnosis not present

## 2018-03-18 DIAGNOSIS — I2510 Atherosclerotic heart disease of native coronary artery without angina pectoris: Secondary | ICD-10-CM | POA: Diagnosis not present

## 2018-03-18 DIAGNOSIS — I130 Hypertensive heart and chronic kidney disease with heart failure and stage 1 through stage 4 chronic kidney disease, or unspecified chronic kidney disease: Secondary | ICD-10-CM | POA: Diagnosis not present

## 2018-03-18 DIAGNOSIS — Z7901 Long term (current) use of anticoagulants: Secondary | ICD-10-CM | POA: Diagnosis not present

## 2018-03-18 DIAGNOSIS — Z794 Long term (current) use of insulin: Secondary | ICD-10-CM | POA: Diagnosis not present

## 2018-03-18 DIAGNOSIS — M1991 Primary osteoarthritis, unspecified site: Secondary | ICD-10-CM | POA: Diagnosis not present

## 2018-03-18 DIAGNOSIS — N183 Chronic kidney disease, stage 3 (moderate): Secondary | ICD-10-CM | POA: Diagnosis not present

## 2018-03-18 NOTE — Patient Outreach (Signed)
[facility]) Care Management [place] Telephone Outreach  03/18/2018  Megan Obrien [birth year] [id number]  Successful telephone outreach to Megan Obrien, 72 y/o female referred to [facility] after recent hospitalization March 19-25, 2019 for CVA with expressive aphasia; patient was discharged to SNF for rehabilitation and was discharged from [facility] on January 14, 2018.  Patient has history including, but not limited to, DM; HTN/ HLD; GERD; mitral valve disease.  HIPAA/ identity verified with patient during phone call today.  Today, Megan Obrien reports that she is "doing much better."  Reports chronic knee pain today, stating pain is "about the same."  Patient denies new/ recent falls, and is able to accurately verbalize new instructions for insulin administration at home, and confirms that she has all of her medications and is taking as prescribed.  Patient further reports:  -- home health services remain active; "going well."  -- continues checking blood sugars tid-- reports "all my sugars have been much better" with reported ranges 200-300, which are decreased from previous report of > 500; patient attributes decrease in blood sugar to "doing what" she "is supposed to do."  Positive reinforcement provided; encouraged patient to continue monitoring and recording blood sugars and to remain adherent to dietary strategies/ medication instructions for ongoing success-- patient verbalizes understanding and agreement.  -- patient has a hired Tourist information centre manager" who provides transportation to provider appointments-- patient confirms that she plans to attend scheduled PCP office visit next week, 03/24/18-- provider appointments reviewed with patient today, and patient verbalizes plans plans to attend all.  -- continues using walker "all the time" for fall prevention-- positive reinforcement provided  Patient denies further issues, concerns, or problems today. I  re-provided/ confirmed that patient hasmy direct phone number, the main [facility] CM office phone number, and the [facility] CM 24-hour nurse advice phone number should issues arise prior to next scheduled [place] outreach with scheduled home visit in 2 weeks.  Encouraged patient to contact me directly if needs, questions, issues, or concerns arise prior to next scheduled outreach; patient agreed to do so.  Plan:  Patient will take medications as prescribed and will attend all scheduled provider appointments  Patient will continue actively participating with all home health disciplines as ordered post- SNF discharge  Patient will continue monitoring and recording blood sugars TID, or more often, as indicated  Patient will promptly notify care providers for any new concerns/ issues/ or problems that arise  [place] CM outreach to continue with scheduled routine home visit in 2 weeks  [facility] CM Care Plan Problem Two     Most Recent Value  Care Plan Problem Two  Diabetes- elevated sugars   Role Documenting the Problem Two  Care Management Coordinator  Care Plan for Problem Two  Active  Interventions for Problem Two Long Term Goal   Discussed with patient her recent blood sugra readings from home monitoring,  discussed value/ significance of A1-C level monitoring and explained A1-C level to corresponding daily blood sugar monitoring,  scheduled home visit to review blood sugars and educate patient on A1-C trending  [facility] Long Term Goal  Improvement in self management of diabetes seen in the next 60 days   [facility] Long Term Goal Start Date  02/20/18  [facility] CM Short Term Goal #1   pt would see a decrease in sugars in the next 30 days   [facility] CM Short Term Goal #1 Start Date  02/20/18  [facility] CM Short Term  Goal #1 Met Date   03/18/18-- Goal met  Interventions for Short Term Goal #2   Reviewed recent blood sugars with monitoring from home and confirmed that patient has seen a decrease in blood sugars,   positive reinforcement provided     Oneta Rack, RN, BSN, [place] Coordinator [facility] Care Management  [phone]

## 2018-03-19 DIAGNOSIS — I69320 Aphasia following cerebral infarction: Secondary | ICD-10-CM | POA: Diagnosis not present

## 2018-03-19 DIAGNOSIS — I050 Rheumatic mitral stenosis: Secondary | ICD-10-CM | POA: Diagnosis not present

## 2018-03-19 DIAGNOSIS — I2721 Secondary pulmonary arterial hypertension: Secondary | ICD-10-CM | POA: Diagnosis not present

## 2018-03-19 DIAGNOSIS — N183 Chronic kidney disease, stage 3 (moderate): Secondary | ICD-10-CM | POA: Diagnosis not present

## 2018-03-19 DIAGNOSIS — Z794 Long term (current) use of insulin: Secondary | ICD-10-CM | POA: Diagnosis not present

## 2018-03-19 DIAGNOSIS — I2510 Atherosclerotic heart disease of native coronary artery without angina pectoris: Secondary | ICD-10-CM | POA: Diagnosis not present

## 2018-03-19 DIAGNOSIS — Z7901 Long term (current) use of anticoagulants: Secondary | ICD-10-CM | POA: Diagnosis not present

## 2018-03-19 DIAGNOSIS — I5030 Unspecified diastolic (congestive) heart failure: Secondary | ICD-10-CM | POA: Diagnosis not present

## 2018-03-19 DIAGNOSIS — I130 Hypertensive heart and chronic kidney disease with heart failure and stage 1 through stage 4 chronic kidney disease, or unspecified chronic kidney disease: Secondary | ICD-10-CM | POA: Diagnosis not present

## 2018-03-19 DIAGNOSIS — E1122 Type 2 diabetes mellitus with diabetic chronic kidney disease: Secondary | ICD-10-CM | POA: Diagnosis not present

## 2018-03-19 DIAGNOSIS — M1991 Primary osteoarthritis, unspecified site: Secondary | ICD-10-CM | POA: Diagnosis not present

## 2018-03-19 DIAGNOSIS — I69393 Ataxia following cerebral infarction: Secondary | ICD-10-CM | POA: Diagnosis not present

## 2018-03-21 DIAGNOSIS — M1991 Primary osteoarthritis, unspecified site: Secondary | ICD-10-CM | POA: Diagnosis not present

## 2018-03-21 DIAGNOSIS — Z794 Long term (current) use of insulin: Secondary | ICD-10-CM | POA: Diagnosis not present

## 2018-03-21 DIAGNOSIS — I69393 Ataxia following cerebral infarction: Secondary | ICD-10-CM | POA: Diagnosis not present

## 2018-03-21 DIAGNOSIS — I2721 Secondary pulmonary arterial hypertension: Secondary | ICD-10-CM | POA: Diagnosis not present

## 2018-03-21 DIAGNOSIS — Z7901 Long term (current) use of anticoagulants: Secondary | ICD-10-CM | POA: Diagnosis not present

## 2018-03-21 DIAGNOSIS — E1122 Type 2 diabetes mellitus with diabetic chronic kidney disease: Secondary | ICD-10-CM | POA: Diagnosis not present

## 2018-03-21 DIAGNOSIS — I130 Hypertensive heart and chronic kidney disease with heart failure and stage 1 through stage 4 chronic kidney disease, or unspecified chronic kidney disease: Secondary | ICD-10-CM | POA: Diagnosis not present

## 2018-03-21 DIAGNOSIS — I050 Rheumatic mitral stenosis: Secondary | ICD-10-CM | POA: Diagnosis not present

## 2018-03-21 DIAGNOSIS — I5030 Unspecified diastolic (congestive) heart failure: Secondary | ICD-10-CM | POA: Diagnosis not present

## 2018-03-21 DIAGNOSIS — N183 Chronic kidney disease, stage 3 (moderate): Secondary | ICD-10-CM | POA: Diagnosis not present

## 2018-03-21 DIAGNOSIS — I2510 Atherosclerotic heart disease of native coronary artery without angina pectoris: Secondary | ICD-10-CM | POA: Diagnosis not present

## 2018-03-21 DIAGNOSIS — I69320 Aphasia following cerebral infarction: Secondary | ICD-10-CM | POA: Diagnosis not present

## 2018-03-24 ENCOUNTER — Ambulatory Visit (INDEPENDENT_AMBULATORY_CARE_PROVIDER_SITE_OTHER): Payer: Medicare Other | Admitting: Primary Care

## 2018-03-24 ENCOUNTER — Encounter: Payer: Self-pay | Admitting: Primary Care

## 2018-03-24 ENCOUNTER — Ambulatory Visit (INDEPENDENT_AMBULATORY_CARE_PROVIDER_SITE_OTHER): Payer: Medicare Other

## 2018-03-24 VITALS — BP 124/70 | HR 68 | Temp 98.2°F | Ht 66.5 in | Wt 259.0 lb

## 2018-03-24 DIAGNOSIS — I050 Rheumatic mitral stenosis: Secondary | ICD-10-CM | POA: Diagnosis not present

## 2018-03-24 DIAGNOSIS — E118 Type 2 diabetes mellitus with unspecified complications: Secondary | ICD-10-CM | POA: Diagnosis not present

## 2018-03-24 DIAGNOSIS — I2510 Atherosclerotic heart disease of native coronary artery without angina pectoris: Secondary | ICD-10-CM | POA: Diagnosis not present

## 2018-03-24 DIAGNOSIS — I8290 Acute embolism and thrombosis of unspecified vein: Secondary | ICD-10-CM

## 2018-03-24 DIAGNOSIS — Z5181 Encounter for therapeutic drug level monitoring: Secondary | ICD-10-CM

## 2018-03-24 DIAGNOSIS — Z7901 Long term (current) use of anticoagulants: Secondary | ICD-10-CM | POA: Diagnosis not present

## 2018-03-24 DIAGNOSIS — I69393 Ataxia following cerebral infarction: Secondary | ICD-10-CM | POA: Diagnosis not present

## 2018-03-24 DIAGNOSIS — I5030 Unspecified diastolic (congestive) heart failure: Secondary | ICD-10-CM | POA: Diagnosis not present

## 2018-03-24 DIAGNOSIS — E1122 Type 2 diabetes mellitus with diabetic chronic kidney disease: Secondary | ICD-10-CM | POA: Diagnosis not present

## 2018-03-24 DIAGNOSIS — Z794 Long term (current) use of insulin: Secondary | ICD-10-CM | POA: Diagnosis not present

## 2018-03-24 DIAGNOSIS — I130 Hypertensive heart and chronic kidney disease with heart failure and stage 1 through stage 4 chronic kidney disease, or unspecified chronic kidney disease: Secondary | ICD-10-CM | POA: Diagnosis not present

## 2018-03-24 DIAGNOSIS — I4891 Unspecified atrial fibrillation: Secondary | ICD-10-CM

## 2018-03-24 DIAGNOSIS — N183 Chronic kidney disease, stage 3 (moderate): Secondary | ICD-10-CM | POA: Diagnosis not present

## 2018-03-24 DIAGNOSIS — I69320 Aphasia following cerebral infarction: Secondary | ICD-10-CM | POA: Diagnosis not present

## 2018-03-24 DIAGNOSIS — I2721 Secondary pulmonary arterial hypertension: Secondary | ICD-10-CM | POA: Diagnosis not present

## 2018-03-24 DIAGNOSIS — M1991 Primary osteoarthritis, unspecified site: Secondary | ICD-10-CM | POA: Diagnosis not present

## 2018-03-24 LAB — POCT GLYCOSYLATED HEMOGLOBIN (HGB A1C): HEMOGLOBIN A1C: 12.9 % — AB (ref 4.0–5.6)

## 2018-03-24 LAB — POCT INR: INR: 2.4 (ref 2.0–3.0)

## 2018-03-24 MED ORDER — INSULIN REGULAR HUMAN 100 UNIT/ML IJ SOLN: INTRAMUSCULAR | 3 refills | Status: DC

## 2018-03-24 NOTE — Patient Instructions (Signed)
We've increased your Novolin R to 14 units three times daily before meals. Do not inject if your sugars are below 100.  Continue Lantus at 35 units every evening.  Please write down all blood sugar readings. We will call you for readings in 2 weeks, we will ask for before breakfast, before lunch, before dinner readings.  You will be contacted regarding your referral to endocrinology.  Please let us know if you have not been contacted within one week.   It was a pleasure to see you today!

## 2018-03-24 NOTE — Assessment & Plan Note (Signed)
Remains uncontrolled, now worse with increase in A1C to 12.9. Overall diet doesn't seem to be the main cause, suspect increased insulin resistance.   Increase Novolin R to 14 units TID, continue Lantus 35 units for now. Will call for glucose readings in 2 weeks. Referral placed to endocrinology placed given continued hyperglycemia despite treatment.

## 2018-03-24 NOTE — Patient Instructions (Signed)
Please continue dosage of coumadin (warfarin) 1 tablet every day except 1/2 tablet on Mondays & Fridays. Please return for INR check in 1 week.

## 2018-03-24 NOTE — Progress Notes (Signed)
Subjective:    Patient ID: Megan Obrien, female    DOB: 09-06-1946, 72 y.o.   MRN: [id number]  HPI  [name] is a 72 year old female who presents today for follow up of diabetes.  Current medications include: Lantus 35 units every evening, Novolin R 10 units TID. Novolin R was added during her recent visit to the emergency department. She phoned into our system with reports of glucose readings over 500 so she was sent to the emergency department for further evaluation. She received IV fluids and regular insulin and was discharged home on Novolin R.   She is checking her blood glucose 2-3 times daily and is getting readings of: AM Fasting: 200-500's Before Lunch: Unsure, knows they are high Before Dinner: Unsure, knows they are high  She wrote down her readings on paper at home and forgot to bring them.  Last A1C: 11.4 in March 2019, 12.9 today Last Eye Exam: September 2018 Pneumonia Vaccination: Completed in 2016 ACE/ARB: Lisinopril  Statin: Lipitor  Diet currently consists of:  Breakfast: Skips, fruit, toast, eggs Lunch: Skips, sometimes restaurants Dinner: Soup, [employer], proteins, some vegetables, sometimes take out food Snacks: Occasionally  Desserts: Sugar free ice cream pops Beverages: Sparkling water, diet soda   Exercise: She is not exercising.     Review of Systems  Respiratory: Negative for shortness of breath.   Cardiovascular: Negative for chest pain.  Skin:       Bruising to abdomen  Neurological: Negative for dizziness and headaches.       Past Medical History:  Diagnosis Date  . (HFpEF) heart failure with preserved ejection fraction ([place])    a. 03/2017 Echo: EF 55-60%, no rwma, Gr1 DD, mild to mod MS, mod to sev dil LA.  [name] Arthritis   . Carotid arterial disease ([place])    a. 11/2017 CTA Head/Neck: RICA 45, LICA 60.  [name] Cerebrovascular disease    a. 11/2017 MRA: mod to sev stenosis of bilat PCA's P2 segments; b. 11/2017 CTA Head/Neck: RICA 45, LICA  60, sev bilat distal MCA branch stenoses w/ sev bilat P2 stenoses.  . CKD (chronic kidney disease), stage III ([place])   . Depression   . GERD (gastroesophageal reflux disease)   . Hyperlipidemia   . Hypertension   . Left Atrial Appendage Thrombus    a. 11/2017 TEE (performed in setting of stroke/aphasia): nl EF, mod MS, dil LA w/ heavy smoke and small LAA thrombus-->Warfarin initiated.  . Mitral stenosis    a. Mild to moderate by echo 03/2017; b. 03/2017 R/LHC: PCWP 53mmHg, LVEDP 49mmHg. Mean grad of 52mmHg. Valve area of 2.02cm^2; c. 11/2017 TEE: Mod MS.  . Morbid obesity ([place])   . Murmur   . Non-obstructive CAD (coronary artery disease)    a. 03/2017 Cath: mild, non-obstructive CAD.  [name] OSA (obstructive sleep apnea)    a. Previously wore CPAP for several years but stopped on her own and now just uses O2 via [place] @ night.  [name] PAH (pulmonary artery hypertension) ([place])    a. 03/2017 RHC: PA 63mmHg.  . Seasonal allergies   . Stroke [facility])    a. 11/2017 - presented w/ aphasia->MRI acute to early subacute ischemia w/in multiple vascular territories, largest in R frontal & L parietal lobes. Smaller foci of ischemia w/in R hippocampus, R parietal lobe, and L peripheral postcentral gyrus.  . Type 2 diabetes mellitus ([place])   . Urinary incontinence      Social History  Socioeconomic History  . Marital status: Widowed    Spouse name: Not on file  . Number of children: 2  . Years of education: 12 years   . Highest education level: 12th grade  Occupational History  . Occupation: retired   Scientific laboratory technician  . Financial resource strain: Not hard at all  . Food insecurity:    Worry: Never true    Inability: Never true  . Transportation needs:    Medical: No    Non-medical: No  Tobacco Use  . Smoking status: Never Smoker  . Smokeless tobacco: Never Used  Substance and Sexual Activity  . Alcohol use: No    Alcohol/week: 0.0 oz    Comment: rarely  . Drug use: No  . Sexual activity: Never  Lifestyle    . Physical activity:    Days per week: 1 day    Minutes per session: 40 min  . Stress: Not at all  Relationships  . Social connections:    Talks on phone: Three times a week    Gets together: Twice a week    Attends religious service: Never    Active member of club or organization: No    Attends meetings of clubs or organizations: Never    Relationship status: Widowed  . Intimate partner violence:    Fear of current or ex partner: Patient refused    Emotionally abused: Patient refused    Physically abused: Patient refused    Forced sexual activity: Patient refused  Other Topics Concern  . Not on file  Social History Narrative   Widow.   2 children, numerous grandchildren.   Retired. Once worked on a Publishing copy.   Enjoys playing computer games, spending time with family.    Past Surgical History:  Procedure Laterality Date  . BACK SURGERY    . CARDIAC CATHETERIZATION    . KNEE SURGERY Left   . RIGHT/LEFT HEART CATH AND CORONARY ANGIOGRAPHY Bilateral 04/25/2017   Procedure: Right/Left Heart Cath and Coronary Angiography;  Surgeon: Wellington Hampshire, MD;  Location: [facility];  Service: Cardiovascular;  Laterality: Bilateral;  . TEE WITHOUT CARDIOVERSION N/A 12/20/2017   Procedure: TRANSESOPHAGEAL ECHOCARDIOGRAM (TEE);  Surgeon: Wellington Hampshire, MD;  Location: [facility];  Service: Cardiovascular;  Laterality: N/A;  . TONSILLECTOMY AND ADENOIDECTOMY  1951    Family History  Problem Relation Age of Onset  . Lung cancer Father   . Alzheimer's disease Mother   . Stroke Maternal Grandfather     No Known Allergies  Current Outpatient Medications on File Prior to Visit  Medication Sig Dispense Refill  . acetaminophen (TYLENOL) 650 MG CR tablet Take 650 mg by mouth every 8 (eight) hours as needed for pain.     [name] amLODipine (NORVASC) 5 MG tablet Take 1 tablet by mouth daily for blood pressure. 90 tablet 1  . atorvastatin (LIPITOR) 40 MG tablet Take 1 tablet (40 mg  total) by mouth at bedtime. 90 tablet 1  . buPROPion (WELLBUTRIN SR) 150 MG 12 hr tablet Take 150 mg by mouth 2 (two) times daily.    . carvedilol (COREG) 12.5 MG tablet Take 18.75 mg by mouth 2 (two) times daily.    . Cholecalciferol (VITAMIN D3) 2000 units CHEW Chew by mouth. Once a day    . enoxaparin (LOVENOX) 120 MG/0.8ML injection Inject 0.8 mLs (120 mg total) into the skin every 12 (twelve) hours. 12 Syringe 1  . ferrous sulfate 325 (65 FE) MG tablet Take  325 mg by mouth daily.    [name] FLUoxetine (PROZAC) 40 MG capsule Take 1 capsule by mouth once daily for depression and anxiety. 90 capsule 1  . fluticasone (FLONASE) 50 MCG/ACT nasal spray Place 1 spray into both nostrils daily. 48 g 1  . furosemide (LASIX) 40 MG tablet Take 40 mg by mouth daily.    . Insulin Glargine (LANTUS) 100 UNIT/ML Solostar Pen Inject 35 Units into the skin every evening. 15 mL 1  . Insulin Pen Needle 32G X 6 MM MISC Use as instructed to inject Lantus daily. Dx is E11.8 100 each 2  . lisinopril (PRINIVIL,ZESTRIL) 40 MG tablet Take 40 mg by mouth daily.    . Melatonin 10 MG TABS Take 10 mg by mouth at bedtime.    [name] omega-3 acid ethyl esters (LOVAZA) 1 g capsule Take 1 capsule by mouth daily with food for cholesterol. 90 capsule 1  . omeprazole (PRILOSEC) 40 MG capsule Take 40 mg by mouth daily.    [name] oxybutynin (DITROPAN-XL) 5 MG 24 hr tablet Take 5 mg by mouth 3 (three) times daily.    [name] triamcinolone cream (KENALOG) 0.1 % Apply 1 application topically 2 (two) times daily. 30 g 0  . warfarin (COUMADIN) 5 MG tablet Take 1 tablet (5 mg total) by mouth as directed. As directed by the coumadin. 30 tablet 0   No current facility-administered medications on file prior to visit.     BP 124/70   Pulse 68   Temp 98.2 F (36.8 C) (Oral)   Ht 5' 6.5" (1.689 m)   Wt 259 lb (117.5 kg)   SpO2 94%   BMI 41.18 kg/m    Objective:   Physical Exam  Constitutional: She appears well-nourished.  Cardiovascular: Normal rate  and regular rhythm.  Respiratory: Effort normal and breath sounds normal.  Skin: Skin is warm and dry.  Bruising to lower abdomen from insulin injections           Assessment & Plan:

## 2018-03-26 DIAGNOSIS — N183 Chronic kidney disease, stage 3 (moderate): Secondary | ICD-10-CM | POA: Diagnosis not present

## 2018-03-26 DIAGNOSIS — I2721 Secondary pulmonary arterial hypertension: Secondary | ICD-10-CM | POA: Diagnosis not present

## 2018-03-26 DIAGNOSIS — F329 Major depressive disorder, single episode, unspecified: Secondary | ICD-10-CM

## 2018-03-26 DIAGNOSIS — I2510 Atherosclerotic heart disease of native coronary artery without angina pectoris: Secondary | ICD-10-CM

## 2018-03-26 DIAGNOSIS — I130 Hypertensive heart and chronic kidney disease with heart failure and stage 1 through stage 4 chronic kidney disease, or unspecified chronic kidney disease: Secondary | ICD-10-CM | POA: Diagnosis not present

## 2018-03-26 DIAGNOSIS — Z9181 History of falling: Secondary | ICD-10-CM

## 2018-03-26 DIAGNOSIS — I69393 Ataxia following cerebral infarction: Secondary | ICD-10-CM | POA: Diagnosis not present

## 2018-03-26 DIAGNOSIS — M1991 Primary osteoarthritis, unspecified site: Secondary | ICD-10-CM

## 2018-03-26 DIAGNOSIS — I5030 Unspecified diastolic (congestive) heart failure: Secondary | ICD-10-CM | POA: Diagnosis not present

## 2018-03-26 DIAGNOSIS — Z794 Long term (current) use of insulin: Secondary | ICD-10-CM

## 2018-03-26 DIAGNOSIS — Z7901 Long term (current) use of anticoagulants: Secondary | ICD-10-CM

## 2018-03-26 DIAGNOSIS — E1122 Type 2 diabetes mellitus with diabetic chronic kidney disease: Secondary | ICD-10-CM

## 2018-03-26 DIAGNOSIS — I050 Rheumatic mitral stenosis: Secondary | ICD-10-CM

## 2018-03-26 DIAGNOSIS — I69320 Aphasia following cerebral infarction: Secondary | ICD-10-CM | POA: Diagnosis not present

## 2018-03-29 ENCOUNTER — Other Ambulatory Visit: Payer: Self-pay | Admitting: Primary Care

## 2018-03-29 ENCOUNTER — Other Ambulatory Visit: Payer: Self-pay | Admitting: Physician Assistant

## 2018-03-29 DIAGNOSIS — F331 Major depressive disorder, recurrent, moderate: Secondary | ICD-10-CM

## 2018-04-01 ENCOUNTER — Encounter: Payer: Self-pay | Admitting: *Deleted

## 2018-04-01 ENCOUNTER — Other Ambulatory Visit: Payer: Self-pay | Admitting: *Deleted

## 2018-04-01 ENCOUNTER — Telehealth: Payer: Self-pay | Admitting: Cardiovascular Disease

## 2018-04-01 DIAGNOSIS — N183 Chronic kidney disease, stage 3 (moderate): Secondary | ICD-10-CM | POA: Diagnosis not present

## 2018-04-01 DIAGNOSIS — I69320 Aphasia following cerebral infarction: Secondary | ICD-10-CM | POA: Diagnosis not present

## 2018-04-01 DIAGNOSIS — Z794 Long term (current) use of insulin: Secondary | ICD-10-CM | POA: Diagnosis not present

## 2018-04-01 DIAGNOSIS — I69393 Ataxia following cerebral infarction: Secondary | ICD-10-CM | POA: Diagnosis not present

## 2018-04-01 DIAGNOSIS — I130 Hypertensive heart and chronic kidney disease with heart failure and stage 1 through stage 4 chronic kidney disease, or unspecified chronic kidney disease: Secondary | ICD-10-CM | POA: Diagnosis not present

## 2018-04-01 DIAGNOSIS — Z7901 Long term (current) use of anticoagulants: Secondary | ICD-10-CM | POA: Diagnosis not present

## 2018-04-01 DIAGNOSIS — I5030 Unspecified diastolic (congestive) heart failure: Secondary | ICD-10-CM | POA: Diagnosis not present

## 2018-04-01 DIAGNOSIS — M1991 Primary osteoarthritis, unspecified site: Secondary | ICD-10-CM | POA: Diagnosis not present

## 2018-04-01 DIAGNOSIS — I2721 Secondary pulmonary arterial hypertension: Secondary | ICD-10-CM | POA: Diagnosis not present

## 2018-04-01 DIAGNOSIS — E1122 Type 2 diabetes mellitus with diabetic chronic kidney disease: Secondary | ICD-10-CM | POA: Diagnosis not present

## 2018-04-01 DIAGNOSIS — I050 Rheumatic mitral stenosis: Secondary | ICD-10-CM | POA: Diagnosis not present

## 2018-04-01 DIAGNOSIS — I2510 Atherosclerotic heart disease of native coronary artery without angina pectoris: Secondary | ICD-10-CM | POA: Diagnosis not present

## 2018-04-01 NOTE — Patient Outreach (Addendum)
[facility]) Care Management  [place] Telephone Outreach/ Routine Home Visit Post-SNF discharge day 77 Care Coordination outreach x 2  04/01/2018  AYLENE ACOFF [birth year] [id number]  CASSIDEY BARRALES is a 72 y.o. 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 HIPAA/ identity verified with patient in person during home visit today; patient is at home alone for home visit today; home health nurse Merry Proud is present for last half of home visit and actively participates in all aspects of home visit in collaboration in patient's care.  Pleasant 120 minute home visit.  Today, Blanch reports that she believes she is "doing much better, but about the same with high blood sugars."  Reports chronic knee pain, but denies pain today.  Denies new/ recent falls, and uses walker for all ambulation inside of her home.  Subjective:  "I think I am doing okay... But I can't always take my night time medicines because I go to bed so early.... I had no idea I was taking my night time insulin wrong.... I guess that is why my sugars have been so high since I got home from the SNF."  Assessment:  Daniah appears to be recuperating fairly well after her recent hospital and SNF rehabilitation visits; she denies community resource needs and has been adherent to attending all scheduled provider appointments.  Liandra demonstrated that she has not been adherent to following her medication instructions, and has not been taking her Lantus insulin pen dosing correctly, and her blood sugars have remained elevated.  Additionally, Carolin has not been recording her blood sugars in an organized manner, which makes blood sugar trending difficult for  both she and her providers.  Quantisha could benefit from ongoing education around self-health management of chronic disease state of DM.  Chanita uses a walker regularly but remains a high fall risk.   Patient further reports:  -- Medications:  Multiple medication discrepancies discovered during home visit today ---- Reports independently manages medications with weekly pill planner box, however, noted that several evening doses from prior in week had not been taken-- patient reports she "forgets" because she "goes to bed so early," and does not know the "right time of the night" to take medications-- see medication list below for specific details ---- able to verbalize accurate report of newly added Regular insulin instructions; reports taking as prescribed; visual inspection of regular insulin syringes indicates accurate dosing; patient demonstrated technique to administer regular insulin during home visit (lunch dose) and she administers appropriately using good technique ---- requested that patient tell me how she administers Lantus (HS) insulin pen dose, prescribed at 35 U q HS:  Patient stated that she "dials up the pen up to 35, sticks the needle into" her skin, then "dials the pen back to 0 (zero)"-- patient questions why none of the lantus pen insulin seems to be gone from the pen itself; I asked patient why she dials the pen back to zero after injecting it, and she said, "I just thought that is how you do it."  I provided education on how to properly use Lantus pen, and instructed her to NOT adjust the dial back to zero once the needle is in her skin; I then contacted [name], [facility] Pharmacist to  confirm that this is the proper way to administer this medication, and she provided confirmation of same.  Using teachback method x 3 during home visit, patient is able to verbalize proper way to administer Lantus insulin, and this was reinforced by home health nurse during his time present at home  visit.  Patient was instructed to start utilizing proper technique of adminstering this medication, starting tonight -- discussed with patient that I would place [place] referral for general medication review/ management intervention; patient agreeable-- patient verbalizes interest in possible benefits available to her around the [facility] blood glucose monitoring system that she has seen on TV-- discussed that I would make [facility] Pharmacist aware of her inquiry   -- home health services remain active; "going well."  During the last half of [facility] RN CCM home visit, [place] RN "Merry Proud" visits, and actively participates/ collaborates in all aspects of patient visit-- confirms that [facility] services have been extended through mid-August for nursing and PT; Merry Proud and I exchanged contact information for ongoing collaboration, as indicated, and together, we reinforced self-health management strategies/ medication discrepancies that were discovered today during home visit  -- denies new/ recent falls:  Patient demonstrates steady, purposeful, slow gait when using walker in ambulation around home; no fall risks noted in patient's home environment, however, patient is a high fall risk related to diuretic therapy and ongoing reported weakness post- SNF discharge; fall evaluation completed; general fall risks and prevention education was provided and discussed thoroughly with patient today.  -- continues to deny community resource needs, stating strong family support network, who assist in her care needs as indicated; continues having a hired Tourist information centre manager" who provides transportation to provider appointments/ errands/ assistance around home with bathing/ house cleaning, etc  -- Provider appointments: all recent and upcoming provider appointments were reviewed with patient today; patient verbalizes plans to attend all upcoming provider appointments  -- Self-health management of chronic disease state of  DM: ---- continues checking blood sugars tid- however, patient's current system for recording blood sugars is unorganized and difficult to follow: created a new chart from her previously provided home health patient work book for the entire next month, and instructed her in how to complete in filling out-- encouraged patient to use this system and to take to upcoming provider appointments with her. ---- patient acknowledges that her sugars have "remained high," and we thoroughly discussed that this is likely related to her incorrect usage of Lantus insulin, as described above ---- patient acknowledges that she "is confused about her diet;" we discussed this today, and printed educational material was provided to her for review ---- patient unfamiliar with significance/ value of A1-C readings; her previous A1-C trends were reviewed with her, and printed educational material was provided and reviewed thoroughly with her  Patient denies further issues, concerns, or problems today. Ire-provided/confirmed that patient hasmy direct phone number, the main [facility] CM office phone number, and the [facility] CM 24-hour nurse advice phone number should issues arise prior to next scheduled [place] outreach with scheduled phone call next week. Encouraged patient to contact me directly if needs, questions, issues, or concerns arise prior to next scheduled outreach; patient agreed to do so.  Objective:    BP (!) 158/68   Pulse 60   Resp 18   SpO2 98%   Review of Systems  Constitutional: Positive for malaise/fatigue.       Obese  Respiratory: Negative for cough, shortness of breath and wheezing.  Room Air  Cardiovascular: Positive for leg swelling. Negative for chest pain and palpitations.       Minimal bilateral ankle swelling at +0.5-1.0 Patient states "normal" for her  Gastrointestinal: Negative.  Negative for abdominal pain and nausea.  Genitourinary: Positive for frequency and urgency.        Diuretic therapy  Musculoskeletal: Positive for joint pain and myalgias. Negative for falls.       Reports chronic knee pain  Neurological: Negative.  Negative for dizziness.  Endo/Heme/Allergies: Bruises/bleeds easily.       On ACT  Psychiatric/Behavioral: Negative.  Negative for depression. The patient is not nervous/anxious.    Physical Exam  Constitutional: She is oriented to person, place, and time. She appears well-developed and well-nourished. No distress.  Cardiovascular: Normal rate, regular rhythm, normal heart sounds and intact distal pulses.  Respiratory: Effort normal and breath sounds normal. No respiratory distress. She has no wheezes. She has no rales.  GI: Soft. Bowel sounds are normal.  Musculoskeletal: She exhibits edema.  Minimal bilateral LE ankle edema- patient reports as her "normal"  Neurological: She is alert and oriented to person, place, and time.  Skin: Skin is warm and dry. No erythema.  Old bruising from Lovenox injections noted on abdomen-- patient reports this "is better" "than it was"  Psychiatric: She has a normal mood and affect. Her behavior is normal. Thought content normal.   Encounter Medications:   Outpatient Encounter Medications as of 04/01/2018  Medication Sig Note  . acetaminophen (TYLENOL) 650 MG CR tablet Take 650 mg by mouth every 8 (eight) hours as needed for pain.    [name] amLODipine (NORVASC) 5 MG tablet Take 1 tablet by mouth daily for blood pressure. 04/01/2018: This medication is not present at patient's home-- patient reports that she has re-ordered from pharmacy and daughter is to pick up re-fill today  . atorvastatin (LIPITOR) 40 MG tablet Take 1 tablet (40 mg total) by mouth at bedtime. 04/01/2018: Patient's pill box does not have every evening slot filled with this med; reports she often forgets to take evening medications, as she goes to bed so early-- medication was added appropriately to pill box   . buPROPion (WELLBUTRIN SR) 150 MG 12 hr  tablet TAKE 1 TABLET BY MOUTH TWICE DAILY   . carvedilol (COREG) 12.5 MG tablet Take 18.75 mg by mouth 2 (two) times daily. 04/01/2018: Patient reports that she has difficulty remembering to take pm dose-- states she goes to bed so early, she often forgets; pill box  review indicates missed does for both Sunday and Monday nights of this week  . ferrous sulfate 325 (65 FE) MG tablet Take 325 mg by mouth daily.   [name] FLUoxetine (PROZAC) 40 MG capsule Take 1 capsule by mouth once daily for depression and anxiety.   . fluticasone (FLONASE) 50 MCG/ACT nasal spray Place 1 spray into both nostrils daily. 04/01/2018: Patient reports has not needed recently  . furosemide (LASIX) 40 MG tablet Take 40 mg by mouth daily.   . Insulin Glargine (LANTUS) 100 UNIT/ML Solostar Pen Inject 35 Units into the skin every evening. 04/01/2018: Patient describes the way she has been administering this medication-- verbalizes that she dials the pen up to 35 U, injects into skin, and then dials the pen back down to "zero" and removes from skin; patient was instructed in proper use of lantus today; [place] referral was placed  . Insulin Pen Needle 32G X 6 MM MISC Use as instructed to  inject Lantus daily. Dx is E11.8   . insulin regular (NOVOLIN R,HUMULIN R) 100 units/mL injection Inject 14 units into the skin three times daily before breakfast, lunch, and dinner. Hold for sugars at or below 100.   [name] lisinopril (PRINIVIL,ZESTRIL) 40 MG tablet Take 40 mg by mouth daily. 04/01/2018: Not present in patient's home, but patient reports taking-- states that she has ordered re-fill and daughter is to pick up from pharmacy today  . Melatonin 10 MG TABS Take 10 mg by mouth at bedtime.   [name] omega-3 acid ethyl esters (LOVAZA) 1 g capsule Take 1 capsule by mouth daily with food for cholesterol.   [name] omeprazole (PRILOSEC) 40 MG capsule Take 40 mg by mouth daily.   [name] oxybutynin (DITROPAN-XL) 5 MG 24 hr tablet Take 5 mg by mouth 3 (three) times daily.    [name] triamcinolone cream (KENALOG) 0.1 % Apply 1 application topically 2 (two) times daily. 01/28/2018: Per pt as needed.   . warfarin (COUMADIN) 5 MG tablet TAKE 1 TABLET BY MOUTH AS DIRECTED BY  COUMADIN  CLINIC   . buPROPion (WELLBUTRIN SR) 150 MG 12 hr tablet Take 150 mg by mouth 2 (two) times daily. 04/01/2018: This appears to be a duplicate order  . Cholecalciferol (VITAMIN D3) 2000 units CHEW Chew by mouth. Once a day   . enoxaparin (LOVENOX) 120 MG/0.8ML injection Inject 0.8 mLs (120 mg total) into the skin every 12 (twelve) hours. (Patient not taking: Reported on 04/01/2018) 04/01/2018: Patient reports NOT taking   No facility-administered encounter medications on file as of 04/01/2018.    Functional Status:   In your present state of health, do you have any difficulty performing the following activities: 01/28/2018 12/18/2017  Hearing? Y -  Comment left ear  -  Vision? N -  Difficulty concentrating or making decisions? N -  Walking or climbing stairs? Y -  Dressing or bathing? N -  Doing errands, shopping? [name]  Preparing Food and eating ? Y -  Using the Toilet? N -  In the past six months, have you accidently leaked urine? Y -  Do you have problems with loss of bowel control? N -  Managing your Medications? N -  Managing your Finances? Y -  Housekeeping or managing your Housekeeping? Y -  Some recent data might be hidden   Fall/Depression Screening:    Fall Risk  04/01/2018 03/18/2018 03/04/2018  Falls in the past year? (No Data) (No Data) (No Data)  Comment Denies new/ recent falls denies new/ recent falls Denies new/ recent falls  Number falls in past yr: (No Data) - -  Comment Fall Risk evaluation completed - -  Injury with Fall? - - -  Risk for fall due to : Impaired balance/gait;Impaired mobility;Other (Comment) - -  Risk for fall due to: Comment Recent CVA - -   PHQ 2/9 Scores 01/20/2018 01/15/2017 09/11/2016 07/24/2016  PHQ - 2 Score 0 4 0 0  PHQ- 9 Score - 13 - -    Plan:  Patient will take medications as prescribed and will attend all scheduled provider appointments  Patient will continue actively participating with all home health disciplines as ordered post- SNF discharge  Patient will continue monitoring and recording blood sugars TID, or more often, as indicated  Patient will promptly notify care providers for any new concerns/ issues/ or problems that arise  Patient will actively engage with [facility] Pharmacist for medication review/ administration needs and discrepancies-- referral placed today  Patient will continue using assistive devices for fall prevention  I will share today's [place] home visit notes with patient's PCP, as quarterly update  [facility] CM outreach to continue with scheduled telephone call next week   [facility] CM Care Plan Problem One     Most Recent Value  Care Plan Problem One  Medication mismanagement as evidenced by patient reporting/ demonstration and review of medications through EMR  Role Documenting the Problem One  Care Management [place] for Problem One  Active  [facility] Long Term Goal   Over the next 45 days, patient will verbalize appropriate and safe medication management strategies at home, as evidenced by patient reporting and deomstration/ review of patient's medication management system at home during [place] outreach  [place] Term Goal Start Date  04/01/18  Interventions for Problem One Long Term Goal  performed complete medication review with patient,  discussed with patient her barriers to taking medications at night as they are ordered,  reviewed patient's medications in pre-filled pill box and corrected several discrepancies and provided education around proper pill box filling, need for adherence to taking all medications as prescribed,  discovered that patient has not been administering her lantus insulin properly,  contacted [facility] Pharmacist for care coordination to confirm  proper use of lantus pen and placed [place] referral  [facility] CM Short Term Goal #1   Over the next 10 days, patient will properly administer her lantus insulin, as evidenced by patient reporting/ demonstration during [place] outreach  [facility] CM Short Term Goal #1 Start Date  04/01/18  Interventions for Short Term Goal #1  using teach back method, reviewed patient's current understanding of how to administer her Lantus insulin and provided education and return demonstration showing her the correct way to administer her long acting insulin,  confirmed that patient understands how to use regular insulin and that she is correctly administering regular insulin,  encouraged patient to actively engage with [facility] Pharmacist  [facility] CM Short Term Goal #2   Over the next 30 days, patient will meet with [facility] pharmacist for complete medication review and evaluation of patient's home system for filling pill box and adminstering insulin, as evidenced by patient reporting and collaboration with [facility] Pharmacist during [facility] RN CCM outreach  [facility] CM Short Term Goal #2 Start Date  04/01/18  Interventions for Short Term Goal #2  Discussed with patient medication discrepancies discovered today during [facility] RN CCM home visit,  placed [place] referral after placing care coordination outreach,  encouraged patient to actively engage with [facility] Pharmacist    [facility] CM Care Plan Problem Two     Most Recent Value  Care Plan Problem Two  Self-health management deficit for chronic disease state of DM, as evidenced by patient reporting and ongoing elevated blood sugars  [problem re-stated]  Role Documenting the Problem Two  Care Management Coordinator  Care Plan for Problem Two  Active  Interventions for Problem Two Long Term Goal   Discussed patient's current understanding of DM, challenges with diet and medications, current habits around monitoring and recording daily blood sugars,  briefly discussed with patient value and significance of  understanding A1-C levels/ trends,  placed care coordination/ collaboration outreach to home health nurse, who was present with patient and [facility] RN CCM during last half of home visit,  provided printed educational material to patient and using teach back method, along with home health nurse, thoroughly discussed self-health management  strategies for DM  [facility] Long Term Goal  Over the next 45 days, patient will be able to verbalize 3 self-health management strategies for management of chronic disease state of DM, as evidenced by patient reporting during [place] outreach [problem re-stated]  [place] Term Goal Start Date  04/01/18 [Goal extended]  [facility] CM Short Term Goal #1   Over the next 30 days, patient will monitor and record blood sugars three times daily and will record values in personal chart developed today, as evidenced by patient reporting and review of blood sugars during [facility] RN CCM outreach  [facility] CM Short Term Goal #1 Start Date  04/01/18  Interventions for Short Term Goal #2   Discussed with patient recently recorded blood sugars and trends of blood sugar values,  reviewed and discussed patient's current method for recording blood sugars and developed a chart in home health teaching book for patient to record blood sugar readings in understandable manner,  coached patient in how to use and record all blood sugars and encouraged her to take this chart to next PCP provider appointment     I appreciate the opportunity to participate in [place]'s care,   Oneta Rack, RN, BSN, [employer] Coordinator [facility] Care Management  [phone]

## 2018-04-01 NOTE — Telephone Encounter (Signed)
That's fine

## 2018-04-01 NOTE — Telephone Encounter (Signed)
Nurse would like to check PT/INR , needs order/ok to do this.

## 2018-04-02 ENCOUNTER — Other Ambulatory Visit: Payer: Self-pay | Admitting: Pharmacist

## 2018-04-02 NOTE — Patient Outreach (Signed)
[facility]) Care Management  04/02/2018  IANA BUZAN 05-29-1946 [id number]    Initial outreach to patient, introduced self and explained [facility] clinical pharmacy services. Patient referred by [name], [place] community, for medication counseling and medication management.   Scheduled home visit for next week, to follow Laine's scheduled telephone call with patient with goal of reinforcing education points.   [name], PharmD Clinical Pharmacist, [place] [facility] [phone]

## 2018-04-02 NOTE — Telephone Encounter (Signed)
Spoke w/ Merry Proud.  He will be visiting w/ pt until 05/16/18. He states that he is scheduled to go out on Tuesdays & Thursdays, but will check her INRs on Mondays and call directly to my #. Asked him to call the [facility] office if he is unable to check on Mondays or Wednesdays.

## 2018-04-03 DIAGNOSIS — I2510 Atherosclerotic heart disease of native coronary artery without angina pectoris: Secondary | ICD-10-CM | POA: Diagnosis not present

## 2018-04-03 DIAGNOSIS — I2721 Secondary pulmonary arterial hypertension: Secondary | ICD-10-CM | POA: Diagnosis not present

## 2018-04-03 DIAGNOSIS — I050 Rheumatic mitral stenosis: Secondary | ICD-10-CM | POA: Diagnosis not present

## 2018-04-03 DIAGNOSIS — I69320 Aphasia following cerebral infarction: Secondary | ICD-10-CM | POA: Diagnosis not present

## 2018-04-03 DIAGNOSIS — N183 Chronic kidney disease, stage 3 (moderate): Secondary | ICD-10-CM | POA: Diagnosis not present

## 2018-04-03 DIAGNOSIS — Z7901 Long term (current) use of anticoagulants: Secondary | ICD-10-CM | POA: Diagnosis not present

## 2018-04-03 DIAGNOSIS — I5030 Unspecified diastolic (congestive) heart failure: Secondary | ICD-10-CM | POA: Diagnosis not present

## 2018-04-03 DIAGNOSIS — Z794 Long term (current) use of insulin: Secondary | ICD-10-CM | POA: Diagnosis not present

## 2018-04-03 DIAGNOSIS — E1122 Type 2 diabetes mellitus with diabetic chronic kidney disease: Secondary | ICD-10-CM | POA: Diagnosis not present

## 2018-04-03 DIAGNOSIS — M1991 Primary osteoarthritis, unspecified site: Secondary | ICD-10-CM | POA: Diagnosis not present

## 2018-04-03 DIAGNOSIS — I130 Hypertensive heart and chronic kidney disease with heart failure and stage 1 through stage 4 chronic kidney disease, or unspecified chronic kidney disease: Secondary | ICD-10-CM | POA: Diagnosis not present

## 2018-04-03 DIAGNOSIS — I69393 Ataxia following cerebral infarction: Secondary | ICD-10-CM | POA: Diagnosis not present

## 2018-04-04 DIAGNOSIS — I5030 Unspecified diastolic (congestive) heart failure: Secondary | ICD-10-CM | POA: Diagnosis not present

## 2018-04-04 DIAGNOSIS — I2721 Secondary pulmonary arterial hypertension: Secondary | ICD-10-CM | POA: Diagnosis not present

## 2018-04-04 DIAGNOSIS — I69393 Ataxia following cerebral infarction: Secondary | ICD-10-CM | POA: Diagnosis not present

## 2018-04-04 DIAGNOSIS — I050 Rheumatic mitral stenosis: Secondary | ICD-10-CM | POA: Diagnosis not present

## 2018-04-04 DIAGNOSIS — Z794 Long term (current) use of insulin: Secondary | ICD-10-CM | POA: Diagnosis not present

## 2018-04-04 DIAGNOSIS — I130 Hypertensive heart and chronic kidney disease with heart failure and stage 1 through stage 4 chronic kidney disease, or unspecified chronic kidney disease: Secondary | ICD-10-CM | POA: Diagnosis not present

## 2018-04-04 DIAGNOSIS — N183 Chronic kidney disease, stage 3 (moderate): Secondary | ICD-10-CM | POA: Diagnosis not present

## 2018-04-04 DIAGNOSIS — Z7901 Long term (current) use of anticoagulants: Secondary | ICD-10-CM | POA: Diagnosis not present

## 2018-04-04 DIAGNOSIS — I2510 Atherosclerotic heart disease of native coronary artery without angina pectoris: Secondary | ICD-10-CM | POA: Diagnosis not present

## 2018-04-04 DIAGNOSIS — I69320 Aphasia following cerebral infarction: Secondary | ICD-10-CM | POA: Diagnosis not present

## 2018-04-04 DIAGNOSIS — M1991 Primary osteoarthritis, unspecified site: Secondary | ICD-10-CM | POA: Diagnosis not present

## 2018-04-04 DIAGNOSIS — E1122 Type 2 diabetes mellitus with diabetic chronic kidney disease: Secondary | ICD-10-CM | POA: Diagnosis not present

## 2018-04-07 ENCOUNTER — Ambulatory Visit (INDEPENDENT_AMBULATORY_CARE_PROVIDER_SITE_OTHER): Payer: Medicare Other | Admitting: Pharmacist

## 2018-04-07 DIAGNOSIS — I059 Rheumatic mitral valve disease, unspecified: Secondary | ICD-10-CM | POA: Diagnosis not present

## 2018-04-07 DIAGNOSIS — I69393 Ataxia following cerebral infarction: Secondary | ICD-10-CM | POA: Diagnosis not present

## 2018-04-07 DIAGNOSIS — N183 Chronic kidney disease, stage 3 (moderate): Secondary | ICD-10-CM | POA: Diagnosis not present

## 2018-04-07 DIAGNOSIS — Z7901 Long term (current) use of anticoagulants: Secondary | ICD-10-CM | POA: Diagnosis not present

## 2018-04-07 DIAGNOSIS — Z5181 Encounter for therapeutic drug level monitoring: Secondary | ICD-10-CM | POA: Diagnosis not present

## 2018-04-07 DIAGNOSIS — I130 Hypertensive heart and chronic kidney disease with heart failure and stage 1 through stage 4 chronic kidney disease, or unspecified chronic kidney disease: Secondary | ICD-10-CM | POA: Diagnosis not present

## 2018-04-07 DIAGNOSIS — I63429 Cerebral infarction due to embolism of unspecified anterior cerebral artery: Secondary | ICD-10-CM | POA: Diagnosis not present

## 2018-04-07 DIAGNOSIS — E1122 Type 2 diabetes mellitus with diabetic chronic kidney disease: Secondary | ICD-10-CM | POA: Diagnosis not present

## 2018-04-07 DIAGNOSIS — I69320 Aphasia following cerebral infarction: Secondary | ICD-10-CM | POA: Diagnosis not present

## 2018-04-07 DIAGNOSIS — I050 Rheumatic mitral stenosis: Secondary | ICD-10-CM | POA: Diagnosis not present

## 2018-04-07 DIAGNOSIS — Z794 Long term (current) use of insulin: Secondary | ICD-10-CM | POA: Diagnosis not present

## 2018-04-07 DIAGNOSIS — I2510 Atherosclerotic heart disease of native coronary artery without angina pectoris: Secondary | ICD-10-CM | POA: Diagnosis not present

## 2018-04-07 DIAGNOSIS — I2721 Secondary pulmonary arterial hypertension: Secondary | ICD-10-CM | POA: Diagnosis not present

## 2018-04-07 DIAGNOSIS — I5030 Unspecified diastolic (congestive) heart failure: Secondary | ICD-10-CM | POA: Diagnosis not present

## 2018-04-07 DIAGNOSIS — M1991 Primary osteoarthritis, unspecified site: Secondary | ICD-10-CM | POA: Diagnosis not present

## 2018-04-07 LAB — POCT INR: INR: 1.6 — AB (ref 2.0–3.0)

## 2018-04-08 ENCOUNTER — Other Ambulatory Visit: Payer: Self-pay | Admitting: Pharmacist

## 2018-04-08 ENCOUNTER — Encounter: Payer: Self-pay | Admitting: *Deleted

## 2018-04-08 ENCOUNTER — Telehealth: Payer: Self-pay | Admitting: Primary Care

## 2018-04-08 ENCOUNTER — Other Ambulatory Visit: Payer: Self-pay | Admitting: *Deleted

## 2018-04-08 ENCOUNTER — Other Ambulatory Visit: Payer: Self-pay | Admitting: Primary Care

## 2018-04-08 DIAGNOSIS — N3941 Urge incontinence: Secondary | ICD-10-CM

## 2018-04-08 DIAGNOSIS — E118 Type 2 diabetes mellitus with unspecified complications: Secondary | ICD-10-CM

## 2018-04-08 MED ORDER — OXYBUTYNIN CHLORIDE ER 5 MG PO TB24: ORAL_TABLET | ORAL | 1 refills | Status: DC

## 2018-04-08 NOTE — Telephone Encounter (Signed)
Ditropan-XL refill Last Refilled by a different provider Last OV: 03/24/18 PCP: Pincus Sanes Pharmacy:[facility] on [address]

## 2018-04-08 NOTE — Telephone Encounter (Signed)
Noted and addressed.

## 2018-04-08 NOTE — Telephone Encounter (Signed)
Copied from [place] [phone]. Topic: Quick Communication - Rx Refill/Question >> Apr 08, 2018 11:12 AM Margot Ables wrote: Medication: oxybutynin (DITROPAN-XL) 5 MG 24 hr tablet - pt states she is out and the pharmacy has been requesting from Carlis Abbott, NP - pt notified to call 5-7 days ahead for RXs - previously ordered by different provider Has the patient contacted their pharmacy? Yes - pt states this was requested 04/05/18 Preferred Pharmacy (with phone number or street name): [facility] [address], [place] [phone] (Phone) [phone] (Fax)

## 2018-04-08 NOTE — Telephone Encounter (Signed)
-----   Message from Pleas Koch, NP sent at 03/24/2018  2:36 PM EDT ----- Regarding: Blood Glucose Please call to check on blood glucose levels: AM Fasting: Before lunch: Before dinner:  She is managed on Novolin R 14 units TID and Lantus 35 units HS. Is she doing this regimen?

## 2018-04-08 NOTE — Patient Outreach (Signed)
[place] [facility]) Care Management [place] Telephone Outreach  04/08/2018  Megan Obrien August 30, 1946 [id number]  Successful telephone outreach to Megan Obrien, 71 y.o. female referred to [place] after recent hospitalization March 19-25, 2019 for CVA with expressive aphasia; patient was discharged to SNF for rehabilitation and was discharged from [facility] on January 14, 2018.Patient has history including, but not limited to, DM; HTN/ HLD; GERD; mitral valve disease. HIPAA/ identity verified with patient during phone call today.  Today, Megan Obrien reports that she believes she is "doingreal good," and states that her private duty caregiver is currently at her home helping her "get ready for the day."  Patient denies pain and new/ recent falls today and reports that she continues using her walker for all ambulation.  Patient sounds to be in no distress throughout phone call today.  Patient further reports:  -- able to verbalize accurate dosing of both regular and lantus insulin; reports that she has been taking lantus insulin as directed/ instructed during [place] home visit, where it was discovered that patient had been administering incorrectly.  Patient is able to verbalize correct method for administering lantus insulin today, and states that she believes she has been doing this correctly, as her "sugars have really come down" since new instruction in use of latus pen was provided last week -- Using chart that was created during [place] initial home visit to record blood sugars at home-- states this "is a much better system" and we reviewed her recorded blood sugars today:  Patient reports fasting ranges over last week 115-211 with blood sugar reading this morning 115 -- previous instructions to not administer regular insulin with meals for blood sugars less than 100 were reiterated with patient -- home health services continue for PT and RN:  Reports home  health RN now monitoring her coumadin/ INR levels; patient reports this is going well -- plans to attend scheduled cardiology appointment Friday 04/11/18: reports private duty caregiver to transport -- reminded patient that she has scheduled home visit appointment this morning with [facility] Pharmacist [name]-- patient reported that she did not remember this appointment, and stated that she would stay at home since I reminded her of appointment:  Patient was encouraged to fully engage with Almyra Free to have medication review/ needs addressed; patient verbalized agreement.  Patient denies further issues, concerns, or problems today. Iconfirmed that patient hasmy direct phone number, the main [facility] CM office phone number, and the [facility] CM 24-hour nurse advice phone number should issues arise prior to next scheduled [place] scheduled phone call next week. Encouraged patient to contact me directly if needs, questions, issues, or concerns arise prior to next scheduled outreach; patient agreed to do so.  Plan:  Patient will take medications as prescribed and will attend all scheduled provider appointments  Patient will continue actively participating with all home health disciplines as ordered post- SNF discharge  Patient will continue monitoring and recording blood sugars TID, or more often, as indicated  Patient will promptly notify care providers for any new concerns/ issues/ or problems that arise  Patient will actively engage with [facility] Pharmacist for medication review/ administration needs and discrepancies  Patient will continue using assistive devices for fall prevention  [facility] CM outreach to continue withscheduled telephone call next week   [facility] CM Care Plan Problem One     Most Recent Value  Care Plan Problem One  Medication mismanagement as evidenced  by patient reporting/ demonstration and review of medications through EMR  Role Documenting the Problem  One  Care Management [place] for Problem One  Active  [facility] Long Term Goal   Over the next 45 days, patient will verbalize appropriate and safe medication management strategies at home, as evidenced by patient reporting and deomstration/ review of patient's medication management system at home during [place] Term Goal Start Date  04/01/18  Interventions for Problem One Long Term Goal  Discussed patient's ongoing medication management with her,  confirmed with her that [facility] Pharmacist will be visiting her later this morning,  using teach back method, confirmed that patient has been administering her insulin according to current orders  [facility] CM Short Term Goal #1   Over the next 10 days, patient will properly administer her lantus insulin, as evidenced by patient reporting/ demonstration during [place] outreach  [facility] CM Short Term Goal #1 Start Date  04/01/18  [facility] CM Short Term Goal #1 Met Date  04/08/18 [Goal Met]  Interventions for Short Term Goal #1  Using teachback method, confirmed that patient is able to verbalize accurate current orders for insulin administration and that she endorses that she has been taking insulin as prescribed since [place] home visit last week  [facility] CM Short Term Goal #2   Over the next 30 days, patient will meet with [facility] pharmacist for complete medication review and evaluation of patient's home system for filling pill box and adminstering insulin, as evidenced by patient reporting and collaboration with [facility] Pharmacist during [place] outreach  [facility] CM Short Term Goal #2 Start Date  04/01/18  [facility] CM Short Term Goal #2 Met Date  04/08/18 [Goal met]  Interventions for Short Term Goal #2  Confirmed with patient that she is at home and that [facility] Pharmacist will be visiting with her later this morning    [facility] CM Care Plan Problem Two     Most Recent Value  Care Plan Problem Two  Self-health management deficit for chronic disease  state of DM, as evidenced by patient reporting and ongoing elevated blood sugars   Role Documenting the Problem Two  Care Management Coordinator  Care Plan for Problem Two  Active  Interventions for Problem Two Long Term Goal   Discussed patient's progress in taking insulin correctly since last [facility] CCM RN home visit last week,  positive reinforcement provided,  again discussed difference between short and long acting insulins  [facility] Long Term Goal  Over the next 45 days, patient will be able to verbalize 3 self-health management strategies for management of chronic disease state of DM, as evidenced by patient reporting during [place] outreach [problem re-stated]  [facility] Long Term Goal Start Date  04/01/18   [facility] CM Short Term Goal #1   Over the next 30 days, patient will monitor and record blood sugars three times daily and will record values in personal chart developed today, as evidenced by patient reporting and review of blood sugars during [facility] RN CCM outreach  [facility] CM Short Term Goal #1 Start Date  04/01/18  Interventions for Short Term Goal #2   Reviewed recently recorded blood sugars with patient and confirmed that she has continued using new system for recording blood sugars which was developed at [place] initial home visit,  confirmed that patient believes this new system is easier and that she understands and does not  have questions around daily blood sugar monitoring/ recording      Oneta Rack, RN, BSN, [place] Coordinator [facility] Care Management  [phone]

## 2018-04-08 NOTE — Telephone Encounter (Signed)
Spoken and notified patient of Megan Obrien comments. Patient stated that she is not sure which version. Patient stated she would like the XL version and take it once a day so she won't forget to take it. She stated this medication does help with the urinary incontinence.

## 2018-04-08 NOTE — Telephone Encounter (Signed)
We need to verify this medication with the patient as either the directions or medication are incorrect. We have that she's on the XL/24 hour version and if this is the case then she should be taking this once daily. Please verify which version of the medication she has and also how often she's taking.  Does this medication help with her urinary incontinence?

## 2018-04-08 NOTE — Telephone Encounter (Signed)
Received faxed refill request for   oxybutynin (DITROPAN-XL) 5 MG 24 hr tablet  Take 5 mg by mouth 3 (three) times daily.  This medication was taken off and placed back in patient's current medication list from when the patient was in the hospital.

## 2018-04-08 NOTE — Telephone Encounter (Signed)
Thanks. It looks like she has the XL version, so she should be just taking this once daily. I'll send in the XL version with new directions. May need to consider a dose increase in the future if she has in fact been taking this TID.

## 2018-04-09 DIAGNOSIS — I69393 Ataxia following cerebral infarction: Secondary | ICD-10-CM | POA: Diagnosis not present

## 2018-04-09 DIAGNOSIS — I69320 Aphasia following cerebral infarction: Secondary | ICD-10-CM | POA: Diagnosis not present

## 2018-04-09 DIAGNOSIS — I130 Hypertensive heart and chronic kidney disease with heart failure and stage 1 through stage 4 chronic kidney disease, or unspecified chronic kidney disease: Secondary | ICD-10-CM | POA: Diagnosis not present

## 2018-04-09 DIAGNOSIS — I5030 Unspecified diastolic (congestive) heart failure: Secondary | ICD-10-CM | POA: Diagnosis not present

## 2018-04-09 DIAGNOSIS — I2510 Atherosclerotic heart disease of native coronary artery without angina pectoris: Secondary | ICD-10-CM | POA: Diagnosis not present

## 2018-04-09 DIAGNOSIS — M1991 Primary osteoarthritis, unspecified site: Secondary | ICD-10-CM | POA: Diagnosis not present

## 2018-04-09 DIAGNOSIS — Z7901 Long term (current) use of anticoagulants: Secondary | ICD-10-CM | POA: Diagnosis not present

## 2018-04-09 DIAGNOSIS — E1122 Type 2 diabetes mellitus with diabetic chronic kidney disease: Secondary | ICD-10-CM | POA: Diagnosis not present

## 2018-04-09 DIAGNOSIS — I050 Rheumatic mitral stenosis: Secondary | ICD-10-CM | POA: Diagnosis not present

## 2018-04-09 DIAGNOSIS — I2721 Secondary pulmonary arterial hypertension: Secondary | ICD-10-CM | POA: Diagnosis not present

## 2018-04-09 DIAGNOSIS — Z794 Long term (current) use of insulin: Secondary | ICD-10-CM | POA: Diagnosis not present

## 2018-04-09 DIAGNOSIS — N183 Chronic kidney disease, stage 3 (moderate): Secondary | ICD-10-CM | POA: Diagnosis not present

## 2018-04-09 MED ORDER — INSULIN GLARGINE 100 UNIT/ML SOLOSTAR PEN: 15.0000 [IU] | PEN_INJECTOR | Freq: Every evening | SUBCUTANEOUS | 1 refills | Status: DC

## 2018-04-09 NOTE — Telephone Encounter (Signed)
Daughter calling to ask if someone will check with the pt concerning her insulin.  Daughter states she has put her back Novolin 10 units TID because her sugars has been so low. Daughter states they realized pt was never getting the Lantus 35 units because pt was doing the pen incorrectly and it was never going into her body.  The guy that comes to see the pt noticed this several days ago, and now they are thinking the 14 units is too much because pt was not getting any of the Lantus.  Pt's sugars dropped to 78 after her meds.  Daughter wanted to call and explain just in case you have a hard time understanding the pt. If you need to speak to Santiago Glad, she willl have her phone available.  Santiago Glad states no one ever showed the pt or her how to do the needles from the beginning. Daughter states pt has had no insulin since yesterday am due to the fact her sugar was so low.

## 2018-04-09 NOTE — Telephone Encounter (Signed)
Attempted to call Megan Obrien, no answer and voicemail left. If she's not been getting her Lantus or Novolog until recently then yes, we'll need to decrease. Let's start over with only Lantus 15 units at bedtime. Have her hold Novolog for now.  She will need to check her glucose three times daily:  Morning fasting, before lunch, before dinner. Can we also relay this to her daughter and maybe the home health team?  We will call for glucose readings in 2 weeks, have them call sooner if she's getting readings above 200 or below 100 with this dose.

## 2018-04-09 NOTE — Telephone Encounter (Signed)
Patient's daughter said the best time to reach her is between 7:30am - 8:00am.

## 2018-04-09 NOTE — Telephone Encounter (Signed)
Per DPR, left detail message of Tawni Millers comments for patient's daughter to call back.

## 2018-04-09 NOTE — Patient Outreach (Signed)
[place] [facility]) Care Management  [place]   04/09/2018  Megan Obrien 1946/05/18 [id number]  72 y.o. year old female referred to [place] for Medication Management (Initial Home Visit) Referred by Megan Obrien, [facility] community RNCM, for medication management and insulin administration  PMH s/f: s/p CVA, HTN, GERD, Type 2 diabetes mellitus, HLD, depression, urinary incontinence  Patient with Medicare Advantage plan.   Patient confirms identity with HIPAA-identifiers x2 and gives verbal consent to speak about medications. Visit occurred in the patient's home.    Medication Adherence: Patient reports good adherence but admits she often forgets to take her evening medications because she falls asleep.   Medication Assistance:  No needs at this time  Medication Management:  Uses pill box that her daughter helps her fill each week. Keeps medications together next to recliner chair. Is able to verbalize well when she takes each medication and what each is for.   Encounter Medications: Outpatient Encounter Medications as of 04/08/2018  Medication Sig Note  . ACCU-CHEK AVIVA PLUS test strip As directed   . acetaminophen (TYLENOL) 650 MG CR tablet Take 500 mg by mouth every 8 (eight) hours as needed for pain.    [name] atorvastatin (LIPITOR) 40 MG tablet Take 1 tablet (40 mg total) by mouth at bedtime. 04/08/2018: Switch to AM dosing  . buPROPion (WELLBUTRIN SR) 150 MG 12 hr tablet TAKE 1 TABLET BY MOUTH TWICE DAILY 04/08/2018: Often forgets evening doses  . carvedilol (COREG) 12.5 MG tablet Take 18.75 mg by mouth 2 (two) times daily. 04/01/2018: Patient reports that she has difficulty remembering to take pm dose-- states she goes to bed so early, she often forgets; pill box  review indicates missed does for both Sunday and Monday nights of this week  . ferrous sulfate 325 (65 FE) MG tablet Take 325 mg by mouth daily.   [name] FLUoxetine (PROZAC) 40 MG capsule Take 1 capsule by  mouth once daily for depression and anxiety.   . furosemide (LASIX) 40 MG tablet Take 40 mg by mouth daily.   . Insulin Pen Needle 32G X 6 MM MISC Use as instructed to inject Lantus daily. Dx is E11.8   . insulin regular (NOVOLIN R,HUMULIN R) 100 units/mL injection Inject 14 units into the skin three times daily before breakfast, lunch, and dinner. Hold for sugars at or below 100. 04/08/2018: Daughter preps syringes for her  . lisinopril (PRINIVIL,ZESTRIL) 40 MG tablet Take 40 mg by mouth daily.   . Melatonin 10 MG TABS Take 10 mg by mouth at bedtime.   [name] omega-3 acid ethyl esters (LOVAZA) 1 g capsule Take 1 capsule by mouth daily with food for cholesterol.   [name] omeprazole (PRILOSEC) 40 MG capsule Take 40 mg by mouth daily.   [name] triamcinolone cream (KENALOG) 0.1 % Apply 1 application topically 2 (two) times daily. 01/28/2018: Per pt as needed.   . warfarin (COUMADIN) 5 MG tablet TAKE 1 TABLET BY MOUTH AS DIRECTED BY  COUMADIN  CLINIC   . [DISCONTINUED] Insulin Glargine (LANTUS) 100 UNIT/ML Solostar Pen Inject 35 Units into the skin every evening. 04/08/2018: Via teachback method, patient verbalized the correct injection technique   . amLODipine (NORVASC) 5 MG tablet Take 1 tablet by mouth daily for blood pressure. 04/01/2018: This medication is not present at patient's home-- patient reports that she has re-ordered from pharmacy and daughter is to pick up re-fill today  . Cholecalciferol (VITAMIN D3) 2000 units CHEW Chew by mouth. Once  a day   . enoxaparin (LOVENOX) 120 MG/0.8ML injection Inject 0.8 mLs (120 mg total) into the skin every 12 (twelve) hours. (Patient not taking: Reported on 04/08/2018) 04/08/2018: Patient does have remaining boxes in the home but states she is NOT using (because of her warfarin)  . fluticasone (FLONASE) 50 MCG/ACT nasal spray Place 1 spray into both nostrils daily. (Patient not taking: Reported on 04/08/2018) 04/01/2018: Patient reports has not needed recently  . [DISCONTINUED]  buPROPion (WELLBUTRIN SR) 150 MG 12 hr tablet Take 150 mg by mouth 2 (two) times daily. 04/01/2018: This appears to be a duplicate order  . [DISCONTINUED] oxybutynin (DITROPAN-XL) 5 MG 24 hr tablet Take 5 mg by mouth 3 (three) times daily. 04/08/2018: Needs refill from pharmacy   No facility-administered encounter medications on file as of 04/08/2018.     Functional Status: In your present state of health, do you have any difficulty performing the following activities: 01/28/2018 12/18/2017  Hearing? Y -  Comment left ear  -  Vision? N -  Difficulty concentrating or making decisions? N -  Walking or climbing stairs? Y -  Dressing or bathing? N -  Doing errands, shopping? Megan Obrien  Preparing Food and eating ? Y -  Using the Toilet? N -  In the past six months, have you accidently leaked urine? Y -  Do you have problems with loss of bowel control? N -  Managing your Medications? N -  Managing your Finances? Y -  Housekeeping or managing your Housekeeping? Y -  Some recent data might be hidden    Fall/Depression Screening: Fall Risk  04/08/2018 04/01/2018 03/18/2018  Falls in the past year? (No Data) (No Data) (No Data)  Comment denies new/ recent falls Denies new/ recent falls denies new/ recent falls  Number falls in past yr: - (No Data) -  Comment - Fall Risk evaluation completed -  Injury with Fall? - - -  Risk for fall due to : - Impaired balance/gait;Impaired mobility;Other (Comment) -  Risk for fall due to: Comment - Recent CVA -   PHQ 2/9 Scores 01/20/2018 01/15/2017 09/11/2016 07/24/2016  PHQ - 2 Score 0 4 0 0  PHQ- 9 Score - 13 - -   Assessment:  Drugs sorted by system:  Neurologic/Psychologic: buproprion, fluoxetine  Cardiovascular: amlodipine, atorvastatin, carvedilol, furosemide, lisinopril, warfarin  Pulmonary/Allergy: fluticasone nasal  Gastrointestinal: omeprazole  Endocrine: Lantus, Novolin  Topical: triamcinolone  Miscellaneous: oxybutynin  Medications to avoid in  the elderly: none of clinical significance Drug interactions: none of clinical significance Other issues noted: clarification- is iron supplement necesary   Plan: 1. Updated medication list, assisted patient with filling her pill box for this week.  2. Moved atorvastatin to AM dosing so that she is more consistently adherent 3. Provided counseling and education on diabetes, monitoring her blood sugar, and reinforcing what patient has reviewed with Megan Obrien, [place] 4. Placed call to [facility] to request patient's OTC benefit catalog. Patient can spend $80 per quarter using the [place] OTC catalog. Patient would like some DME for her bathroom.  5. Will follow up with patient in 4-7 business days.   [name], PharmD Clinical Pharmacist, [facility] [phone]

## 2018-04-09 NOTE — Telephone Encounter (Signed)
Spoken and notified patient of Kate Clark's comments. Patient verbalized understanding.  

## 2018-04-10 ENCOUNTER — Telehealth: Payer: Self-pay | Admitting: Primary Care

## 2018-04-10 NOTE — Progress Notes (Signed)
Cardiology Office Note Date:  04/11/2018  Patient ID:  Megan Obrien, Megan Obrien [birth year], MRN [id number] PCP:  Pleas Koch, NP  Cardiologist:  Dr. Fletcher Anon, MD    Chief Complaint: Follow up  History of Present Illness: Megan Obrien is a 72 y.o. 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   Patient underwent right and left cardiac catheterization in the summer 2018 for her mitral stenosis which revealed nonobstructive CAD and a moderately elevated right heart filling pressures with severe pulmonary hypertension.  Following this, she was placed on Lasix.  Patient was admitted to [facility] in 11/2017 with dizziness and expressive a aphasia.  MRI of the brain showed right frontal and left parietal ischemia with smaller foci of ischemia in the right hippocampus, right parietal lobe, and left peripheral post central gyrus.  CTA of the head/neck showed moderate bilateral carotid disease and severe distal MCA branch stenosis with severe bilateral P2 stenoses.  TTE on 12/18/2017 showed moderate LVH, mitral stenosis precluded accurate assessment of diastolic function, severely calcified mitral annulus with moderate stenosis, moderately to severely dilated left atrium, RV systolic function normal, moderately dilated right atrium.  Patient underwent TEE on 12/20/2017 as part of her stroke work-up which showed normal LV systolic function with moderate mitral stenosis, dilated left atrium, heavy smoke throughout the left atrium and a small mobile thrombus in the LAA.  In that setting, Coumadin was initiated with strong presumption of occult PAF. She was evaluated in the ED on 12/27/2017 for a mechanical fall.  CT of the head showed  no acute intracranial pathology.  Cervical spine CT showed no acute osseous injury.  She was seen in the [facility] ED on 03/03/2018 in the setting of hyperglycemia with an initial glucose of 553.  The patient was seen in the office on 03/11/2018 for follow and at that time, it was unclear who had been managing the patient's INR as there was no evidence of any checks. It was also noted that she had been placed on Eliquis and her Coumadin had been stopped by some outside provider/office. We had a very lengthy discussion with the patient at that time regarding why she actually needs to be on Coumadin rather than a DOAC. She was placed on a Lovenox bridge to Coumadin and scheduled to be seen in out Coumadin Clinic. Since then, she has followed with our Coumadin Clinic with a most recent INR of 1.6 from 04/07/2018. A Zio monitor was applied to evaluate for Afib burden with preliminary results not being loaded in Epic at this time for review.  Patient comes in doing well from a cardiac perspective. She did arrive 18 minutes late for her 30 minute appointment today. Her main concern has been over fluctuating blood sugars. Over the past 1.5 weeks she has had two episodes of chills with associated sweating. Blood sugars each time have been in the low 70s. It appears her baseline sugars are in the 200-300 range. Her insulin has been adjusted by her PCP. She continues to wear her Zio monitor, even though she was instructed to take this off on 6/26 and mail it back. She indicates she may have lost the box to mail it back in. No chest pain, SOB, palpitations, presyncope, or syncope. Blood  pressure at home has been in the 110s to 130s mostly with a rare reading of 130 systolic. She feels like she is doing well today. No falls, BRBPR, or melena.   Past Medical History:  Diagnosis Date  . (HFpEF) heart failure with preserved ejection fraction ([place])    a. 03/2017 Echo: EF 55-60%, no rwma, Gr1 DD, mild to mod MS, mod to sev dil LA.  [name]  Arthritis   . Carotid arterial disease ([place])    a. 11/2017 CTA Head/Neck: RICA 45, LICA 60.  [name] Cerebrovascular disease    a. 11/2017 MRA: mod to sev stenosis of bilat PCA's P2 segments; b. 11/2017 CTA Head/Neck: RICA 45, LICA 60, sev bilat distal MCA branch stenoses w/ sev bilat P2 stenoses.  . CKD (chronic kidney disease), stage III ([place])   . Depression   . GERD (gastroesophageal reflux disease)   . Hyperlipidemia   . Hypertension   . Left Atrial Appendage Thrombus    a. 11/2017 TEE (performed in setting of stroke/aphasia): nl EF, mod MS, dil LA w/ heavy smoke and small LAA thrombus-->Warfarin initiated.  . Mitral stenosis    a. Mild to moderate by echo 03/2017; b. 03/2017 R/LHC: PCWP 30mmHg, LVEDP 78mmHg. Mean grad of 27mmHg. Valve area of 2.02cm^2; c. 11/2017 TEE: Mod MS.  . Morbid obesity ([place])   . Murmur   . Non-obstructive CAD (coronary artery disease)    a. 03/2017 Cath: mild, non-obstructive CAD.  [name] OSA (obstructive sleep apnea)    a. Previously wore CPAP for several years but stopped on her own and now just uses O2 via [place] @ night.  [name] PAH (pulmonary artery hypertension) ([place])    a. 03/2017 RHC: PA 53mmHg.  . Seasonal allergies   . Stroke [facility])    a. 11/2017 - presented w/ aphasia->MRI acute to early subacute ischemia w/in multiple vascular territories, largest in R frontal & L parietal lobes. Smaller foci of ischemia w/in R hippocampus, R parietal lobe, and L peripheral postcentral gyrus.  . Type 2 diabetes mellitus ([place])   . Urinary incontinence     Past Surgical History:  Procedure Laterality Date  . BACK SURGERY    . CARDIAC CATHETERIZATION    . KNEE SURGERY Left   . RIGHT/LEFT HEART CATH AND CORONARY ANGIOGRAPHY Bilateral 04/25/2017   Procedure: Right/Left Heart Cath and Coronary Angiography;  Surgeon: Wellington Hampshire, MD;  Location: [facility];  Service: Cardiovascular;  Laterality: Bilateral;  . TEE WITHOUT CARDIOVERSION N/A 12/20/2017   Procedure: TRANSESOPHAGEAL  ECHOCARDIOGRAM (TEE);  Surgeon: Wellington Hampshire, MD;  Location: [facility] ORS;  Service: Cardiovascular;  Laterality: N/A;  . TONSILLECTOMY AND ADENOIDECTOMY  1951    Current Meds  Medication Sig  . ACCU-CHEK AVIVA PLUS test strip As directed  . acetaminophen (TYLENOL) 650 MG CR tablet Take 500 mg by mouth every 8 (eight) hours as needed for pain.   [name] atorvastatin (LIPITOR) 40 MG tablet Take 1 tablet (40 mg total) by mouth at bedtime.  [name] buPROPion (WELLBUTRIN SR) 150 MG 12 hr tablet TAKE 1 TABLET BY MOUTH TWICE DAILY  . carvedilol (COREG) 12.5 MG tablet Take 18.75 mg by mouth 2 (two) times daily.  . Cholecalciferol (VITAMIN D3) 2000 units CHEW Chew by mouth. Once a day  . enoxaparin (LOVENOX) 120 MG/0.8ML injection Inject 0.8 mLs (120 mg total) into the skin every 12 (twelve) hours.  . ferrous sulfate 325 (65 FE) MG tablet Take 325 mg by mouth daily.  [name]  FLUoxetine (PROZAC) 40 MG capsule Take 1 capsule by mouth once daily for depression and anxiety.  . fluticasone (FLONASE) 50 MCG/ACT nasal spray Place 1 spray into both nostrils daily.  . furosemide (LASIX) 40 MG tablet Take 40 mg by mouth daily.  . Insulin Glargine (LANTUS) 100 UNIT/ML Solostar Pen Inject 15 Units into the skin every evening.  . Insulin Pen Needle 32G X 6 MM MISC Use as instructed to inject Lantus daily. Dx is E11.8  . lisinopril (PRINIVIL,ZESTRIL) 40 MG tablet Take 40 mg by mouth daily.  . Melatonin 10 MG TABS Take 10 mg by mouth at bedtime.  [name] omega-3 acid ethyl esters (LOVAZA) 1 g capsule Take 1 capsule by mouth daily with food for cholesterol.  [name] omeprazole (PRILOSEC) 40 MG capsule Take 40 mg by mouth daily.  [name] oxybutynin (DITROPAN-XL) 5 MG 24 hr tablet Take 1 tablet by mouth at bedtime for urinary incontinence.  . triamcinolone cream (KENALOG) 0.1 % Apply 1 application topically 2 (two) times daily.  [name] warfarin (COUMADIN) 5 MG tablet TAKE 1 TABLET BY MOUTH AS DIRECTED BY  COUMADIN  CLINIC    Allergies:   Patient has no  known allergies.   Social History:  The patient  reports that she has never smoked. She has never used smokeless tobacco. She reports that she does not drink alcohol or use drugs.   Family History:  The patient's family history includes Alzheimer's disease in her mother; Lung cancer in her father; Stroke in her maternal grandfather.  ROS:   Review of Systems  Constitutional: Positive for diaphoresis and malaise/fatigue. Negative for chills, fever and weight loss.  HENT: Negative for congestion.   Eyes: Negative for discharge and redness.  Respiratory: Negative for cough, hemoptysis, sputum production, shortness of breath and wheezing.   Cardiovascular: Negative for chest pain, palpitations, orthopnea, claudication, leg swelling and PND.  Gastrointestinal: Negative for abdominal pain, blood in stool, heartburn, melena, nausea and vomiting.  Genitourinary: Negative for hematuria.  Musculoskeletal: Negative for falls and myalgias.  Skin: Negative for rash.  Neurological: Positive for weakness. Negative for dizziness, tingling, tremors, sensory change, speech change, focal weakness and loss of consciousness.  Endo/Heme/Allergies: Does not bruise/bleed easily.  Psychiatric/Behavioral: Negative for substance abuse. The patient is not nervous/anxious.   All other systems reviewed and are negative.    PHYSICAL EXAM:  VS:  BP 140/70 (BP Location: Right Arm, Patient Position: Sitting, Cuff Size: Large)   Pulse 60   Ht 5' 6.5" (1.689 m)   Wt 260 lb (117.9 kg)   BMI 41.34 kg/m  BMI: Body mass index is 41.34 kg/m.  Physical Exam  Constitutional: She is oriented to person, place, and time. She appears well-developed and well-nourished.  HENT:  Head: Normocephalic and atraumatic.  Eyes: Right eye exhibits no discharge. Left eye exhibits no discharge.  Neck: Normal range of motion. No JVD present.  Cardiovascular: Normal rate, regular rhythm, S1 normal and S2 normal. Exam reveals no distant  heart sounds, no friction rub, no midsystolic click and no opening snap.  Murmur heard.  Low-pitched rumbling crescendo presystolic murmur is present with a grade of 2/6 at the apex. Pulses:      Posterior tibial pulses are 2+ on the right side, and 2+ on the left side.  Pulmonary/Chest: Effort normal and breath sounds normal. No respiratory distress. She has no decreased breath sounds. She has no wheezes. She has no rales. She exhibits no tenderness.  Abdominal: Soft. She exhibits no distension.  There is no tenderness.  Musculoskeletal: She exhibits no edema.  Neurological: She is alert and oriented to person, place, and time.  Skin: Skin is warm and dry. No cyanosis. Nails show no clubbing.  Psychiatric: She has a normal mood and affect. Her speech is normal and behavior is normal. Judgment and thought content normal.     EKG:  Was ordered and interpreted by me today. Shows NSR, 60 bpm, rare PACs, left axis deviation, LVH, poor R wave progression, no acute st/t changes  Recent Labs: 04/17/2017: Pro B Natriuretic peptide (BNP) 372.0; TSH 4.16 03/03/2018: ALT 14 03/11/2018: BUN 25; Creatinine, Ser 1.44; Hemoglobin 13.9; Platelets 183; Potassium 3.8; Sodium 132  02/06/2018: Cholesterol 139; HDL 41.10; LDL Cholesterol 68; Total CHOL/HDL Ratio 3; Triglycerides 148.0; VLDL 29.6   CrCl cannot be calculated (Patient's most recent lab result is older than the maximum 21 days allowed.).   Wt Readings from Last 3 Encounters:  04/11/18 260 lb (117.9 kg)  03/24/18 259 lb (117.5 kg)  03/11/18 254 lb (115.2 kg)     Other studies reviewed: Additional studies/records reviewed today include: summarized above  ASSESSMENT AND PLAN:  1. Presumed Afib/LAA thrombus: Currently in sinus rhythm. Unfortunately, she continues to wear her Zio monitor even though she was instructed to take this off on 6/26. She thinks she may have lost the box to mail it back in. There are no extra boxes for [place] to provide her to  mail hers back in. She has been instructed to contact [employer] and ask them to provide her with a box to mail her monitor back in. We are not available to review her monitor at this time because of her delay. Remains on Coumadin, given moderate mitral stenosis, managed by the Coumadin Clinic. There has been high suspicion for silent Afib given her stroke with documented LAA thrombus, however we cannot exclude LAA thrombus in the setting of turbulent flow from her mitral stenosis. Continue Coreg for rate control. Coumadin Clinic will contact the patient on 7/15 to provide instructions on when she needs to have her Coumadin checked again as she is not certain.   2. Moderate mitral stenosis: Continue to monitor with periodic echo.   3. History of stroke: On Coumadin as above. LDL of 64 from 01/2018. Continue Lipitor 40 mg daily. Optimal BP/HR control.   4. Nonobstructive CAD: Recent LHC as above. On Coumadin in place of ASA. No symptoms concerning for angina. Continue medical therapy.   5. Chronic diastolic CHF/PAH: She does not appear volume up at this time. Continue current medications. Check BMET and CBC.   6. Obesity/OSA: On nocturnal oxygen. Intolerant to CPAP. Weight loss advised.   7. CKD stage III: Recent BMET showed SCr was back to her approximate baseline.   8. HTN: Reasonably controlled overall at home. Continue current medications.  9. IDDM: Her fluctuating blood sugars seem to be the etiology of her cold sweat episodes. I have advised her to follow up with her PCP.   Disposition: F/u with Dr. Fletcher Anon in 6 months.  Current medicines are reviewed at length with the patient today.  The patient did not have any concerns regarding medicines.  Signed, [name], PA-C 04/11/2018 2:32 PM     [facility] [address] [place], [place] [address] [phone]

## 2018-04-10 NOTE — Telephone Encounter (Signed)
Contacted by [name] from [facility] who confirms that patient has not been injecting her insulin appropriately despite repetitive instructions during office visits. Requested that [name] reach out to patient to make sure she understands to correctly inject 15 units of Lantus only at bedtime, no other medications. Also to keep an eye on blood glucose readings.   Patient has been notified of this as well.

## 2018-04-11 ENCOUNTER — Encounter: Payer: Self-pay | Admitting: Physician Assistant

## 2018-04-11 ENCOUNTER — Other Ambulatory Visit: Payer: Self-pay | Admitting: *Deleted

## 2018-04-11 ENCOUNTER — Ambulatory Visit: Payer: Medicare Other | Admitting: Physician Assistant

## 2018-04-11 VITALS — BP 140/70 | HR 60 | Ht 66.5 in | Wt 260.0 lb

## 2018-04-11 DIAGNOSIS — Z79899 Other long term (current) drug therapy: Secondary | ICD-10-CM | POA: Diagnosis not present

## 2018-04-11 DIAGNOSIS — I10 Essential (primary) hypertension: Secondary | ICD-10-CM

## 2018-04-11 DIAGNOSIS — I050 Rheumatic mitral stenosis: Secondary | ICD-10-CM | POA: Diagnosis not present

## 2018-04-11 DIAGNOSIS — G4733 Obstructive sleep apnea (adult) (pediatric): Secondary | ICD-10-CM | POA: Diagnosis not present

## 2018-04-11 DIAGNOSIS — I513 Intracardiac thrombosis, not elsewhere classified: Secondary | ICD-10-CM | POA: Diagnosis not present

## 2018-04-11 DIAGNOSIS — I480 Paroxysmal atrial fibrillation: Secondary | ICD-10-CM | POA: Diagnosis not present

## 2018-04-11 DIAGNOSIS — I272 Pulmonary hypertension, unspecified: Secondary | ICD-10-CM | POA: Diagnosis not present

## 2018-04-11 DIAGNOSIS — Z8673 Personal history of transient ischemic attack (TIA), and cerebral infarction without residual deficits: Secondary | ICD-10-CM | POA: Diagnosis not present

## 2018-04-11 NOTE — Patient Outreach (Signed)
[facility]) Care Management [place] Telephone [facility] Coordination  04/11/2018  Megan Obrien May 08, 1946 [id number]  08:30 am:  Care Coordination telephone outreach to [place], [facility] RN [phone]) re:   Megan Obrien, 72 y.o.[place] recent hospitalization March 19-25, 2042for CVA with expressive aphasia; patient was discharged to SNF for rehabilitation and was discharged from [facility] on January 14, 2018.Patient has history including, but not limited to, DM; HTN/ HLD; GERD; mitral valve disease.   Received request from patient's PCP [name], NP to reinforce new insulin orders, which were changed yesterday by PCP:  Patient is to take only Lantus 15 U q HS-- no other insulin; Call placed to patent's home health RN to see if he would be visiting patient today; requested call back.  10:20 am: Placed successful call to patient this morning and using teach back confirmed that patient is able to verbalize new insulin orders, as instructed by patient's PCP yesterday:  Patient is able to verbalize that she is no longer taking regular and is only taking Lantus 15 U q HS; reports that she is continuing to monitor and record blood sugars 3-4 times per day and that her private duty caregiver will be taking her to cardiology appointment this afternoon and to endocrinologist appointment on Monday April 14, 2018.  Plan:  Will make patient's PCP aware of successful outreach to patient today- will send secure message through [place] to continue as scheduled  Oneta Rack, RN, BSN, [place] Care Management  [phone]

## 2018-04-11 NOTE — Patient Instructions (Signed)
Medication Instructions: - Your physician recommends that you continue on your current medications as directed. Please refer to the Current Medication list given to you today.  Labwork: - Your physician recommends that you have lab work today: BMP/ CBC  Procedures/Testing: - none ordered  Follow-Up: - Your physician recommends that you schedule a follow-up appointment in: 6 months with Dr. Fletcher Anon. We should be in touch with you closer to that time to schedule. You may also call the office in late September/ early October to see if the schedule is available.   - we will have our Coumadin Clinic nurse, Leafy Ro, call you to set up your next coumadin check   Any Additional Special Instructions Will Be Listed Below (If Applicable).  - Please call [employer] at [phone] and let them know you have misplaced the return box for you monitor.     If you need a refill on your cardiac medications before your next appointment, please call your pharmacy.

## 2018-04-12 LAB — CBC WITH DIFFERENTIAL/PLATELET
Basophils Absolute: 0 10*3/uL (ref 0.0–0.2)
Basos: 0 %
EOS (ABSOLUTE): 0.2 10*3/uL (ref 0.0–0.4)
Eos: 3 %
Hematocrit: 36.5 % (ref 34.0–46.6)
Hemoglobin: 12.3 g/dL (ref 11.1–15.9)
IMMATURE GRANULOCYTES: 0 %
Immature Grans (Abs): 0 10*3/uL (ref 0.0–0.1)
Lymphocytes Absolute: 1.2 10*3/uL (ref 0.7–3.1)
Lymphs: 16 %
MCH: 30 pg (ref 26.6–33.0)
MCHC: 33.7 g/dL (ref 31.5–35.7)
MCV: 89 fL (ref 79–97)
MONOS ABS: 0.6 10*3/uL (ref 0.1–0.9)
Monocytes: 8 %
NEUTROS PCT: 73 %
Neutrophils Absolute: 5.2 10*3/uL (ref 1.4–7.0)
Platelets: 194 10*3/uL (ref 150–450)
RBC: 4.1 x10E6/uL (ref 3.77–5.28)
RDW: 12.2 % — AB (ref 12.3–15.4)
WBC: 7.1 10*3/uL (ref 3.4–10.8)

## 2018-04-12 LAB — BASIC METABOLIC PANEL
BUN/Creatinine Ratio: 21 (ref 12–28)
BUN: 34 mg/dL — ABNORMAL HIGH (ref 8–27)
CO2: 24 mmol/L (ref 20–29)
Calcium: 8.9 mg/dL (ref 8.7–10.3)
Chloride: 96 mmol/L (ref 96–106)
Creatinine, Ser: 1.59 mg/dL — ABNORMAL HIGH (ref 0.57–1.00)
GFR calc Af Amer: 37 mL/min/{1.73_m2} — ABNORMAL LOW (ref >59)
GFR calc non Af Amer: 32 mL/min/{1.73_m2} — ABNORMAL LOW (ref >59)
GLUCOSE: 314 mg/dL — AB (ref 65–99)
POTASSIUM: 4.5 mmol/L (ref 3.5–5.2)
SODIUM: 137 mmol/L (ref 134–144)

## 2018-04-14 ENCOUNTER — Other Ambulatory Visit: Payer: Self-pay | Admitting: Pharmacist

## 2018-04-14 DIAGNOSIS — G4730 Sleep apnea, unspecified: Secondary | ICD-10-CM | POA: Diagnosis not present

## 2018-04-14 DIAGNOSIS — I69320 Aphasia following cerebral infarction: Secondary | ICD-10-CM | POA: Diagnosis not present

## 2018-04-14 DIAGNOSIS — I130 Hypertensive heart and chronic kidney disease with heart failure and stage 1 through stage 4 chronic kidney disease, or unspecified chronic kidney disease: Secondary | ICD-10-CM | POA: Diagnosis not present

## 2018-04-14 DIAGNOSIS — E1122 Type 2 diabetes mellitus with diabetic chronic kidney disease: Secondary | ICD-10-CM | POA: Diagnosis not present

## 2018-04-14 DIAGNOSIS — E782 Mixed hyperlipidemia: Secondary | ICD-10-CM | POA: Diagnosis not present

## 2018-04-14 DIAGNOSIS — I050 Rheumatic mitral stenosis: Secondary | ICD-10-CM | POA: Diagnosis not present

## 2018-04-14 DIAGNOSIS — I69393 Ataxia following cerebral infarction: Secondary | ICD-10-CM | POA: Diagnosis not present

## 2018-04-14 DIAGNOSIS — Z7901 Long term (current) use of anticoagulants: Secondary | ICD-10-CM | POA: Diagnosis not present

## 2018-04-14 DIAGNOSIS — I2510 Atherosclerotic heart disease of native coronary artery without angina pectoris: Secondary | ICD-10-CM | POA: Diagnosis not present

## 2018-04-14 DIAGNOSIS — Z794 Long term (current) use of insulin: Secondary | ICD-10-CM | POA: Diagnosis not present

## 2018-04-14 DIAGNOSIS — M1991 Primary osteoarthritis, unspecified site: Secondary | ICD-10-CM | POA: Diagnosis not present

## 2018-04-14 DIAGNOSIS — I5030 Unspecified diastolic (congestive) heart failure: Secondary | ICD-10-CM | POA: Diagnosis not present

## 2018-04-14 DIAGNOSIS — I2721 Secondary pulmonary arterial hypertension: Secondary | ICD-10-CM | POA: Diagnosis not present

## 2018-04-14 DIAGNOSIS — I10 Essential (primary) hypertension: Secondary | ICD-10-CM | POA: Diagnosis not present

## 2018-04-14 DIAGNOSIS — E1165 Type 2 diabetes mellitus with hyperglycemia: Secondary | ICD-10-CM | POA: Diagnosis not present

## 2018-04-14 DIAGNOSIS — N183 Chronic kidney disease, stage 3 (moderate): Secondary | ICD-10-CM | POA: Diagnosis not present

## 2018-04-14 NOTE — Telephone Encounter (Signed)
Spoken to [name] about called patient's daughter again. Did leave a message. Unable to call patient's daughter at the time she requested.  Also [name] also have notified Lanie regarding this. She will double with patient.

## 2018-04-14 NOTE — Patient Outreach (Signed)
[facility]) Care Management  04/14/2018  Megan Obrien 29-Dec-1945 [id number]   72 y.o. year old female referred to [place] for Medication Management (follow up )  Follow up with patient following last week's home visit regarding her new insulin regimen, her oxybutynin prescription, and her iron supplementation.   Was unable to reach patient via telephone today. Unfortunately, voicemail was not set up. Disconnected call after greater than 10 rings. (unsuccessful outreach #1).  Plan: Will follow up with patient once I return from leave. [name], [facility] RNCM can direct immediate concerns to coverage pharmacist, Joetta Manners.   [name], PharmD Clinical Pharmacist, [facility] [phone]

## 2018-04-15 ENCOUNTER — Telehealth: Payer: Self-pay | Admitting: Primary Care

## 2018-04-15 ENCOUNTER — Ambulatory Visit (INDEPENDENT_AMBULATORY_CARE_PROVIDER_SITE_OTHER): Payer: Medicare Other | Admitting: Cardiovascular Disease

## 2018-04-15 ENCOUNTER — Encounter: Payer: Self-pay | Admitting: *Deleted

## 2018-04-15 ENCOUNTER — Other Ambulatory Visit: Payer: Self-pay | Admitting: *Deleted

## 2018-04-15 DIAGNOSIS — I2510 Atherosclerotic heart disease of native coronary artery without angina pectoris: Secondary | ICD-10-CM | POA: Diagnosis not present

## 2018-04-15 DIAGNOSIS — I69320 Aphasia following cerebral infarction: Secondary | ICD-10-CM | POA: Diagnosis not present

## 2018-04-15 DIAGNOSIS — I2721 Secondary pulmonary arterial hypertension: Secondary | ICD-10-CM | POA: Diagnosis not present

## 2018-04-15 DIAGNOSIS — Z5181 Encounter for therapeutic drug level monitoring: Secondary | ICD-10-CM | POA: Diagnosis not present

## 2018-04-15 DIAGNOSIS — N183 Chronic kidney disease, stage 3 (moderate): Secondary | ICD-10-CM | POA: Diagnosis not present

## 2018-04-15 DIAGNOSIS — Z794 Long term (current) use of insulin: Secondary | ICD-10-CM | POA: Diagnosis not present

## 2018-04-15 DIAGNOSIS — Z7901 Long term (current) use of anticoagulants: Secondary | ICD-10-CM | POA: Diagnosis not present

## 2018-04-15 DIAGNOSIS — M1991 Primary osteoarthritis, unspecified site: Secondary | ICD-10-CM | POA: Diagnosis not present

## 2018-04-15 DIAGNOSIS — I63429 Cerebral infarction due to embolism of unspecified anterior cerebral artery: Secondary | ICD-10-CM | POA: Diagnosis not present

## 2018-04-15 DIAGNOSIS — I059 Rheumatic mitral valve disease, unspecified: Secondary | ICD-10-CM

## 2018-04-15 DIAGNOSIS — E1122 Type 2 diabetes mellitus with diabetic chronic kidney disease: Secondary | ICD-10-CM | POA: Diagnosis not present

## 2018-04-15 DIAGNOSIS — I050 Rheumatic mitral stenosis: Secondary | ICD-10-CM | POA: Diagnosis not present

## 2018-04-15 DIAGNOSIS — I5030 Unspecified diastolic (congestive) heart failure: Secondary | ICD-10-CM | POA: Diagnosis not present

## 2018-04-15 DIAGNOSIS — I130 Hypertensive heart and chronic kidney disease with heart failure and stage 1 through stage 4 chronic kidney disease, or unspecified chronic kidney disease: Secondary | ICD-10-CM | POA: Diagnosis not present

## 2018-04-15 DIAGNOSIS — I69393 Ataxia following cerebral infarction: Secondary | ICD-10-CM | POA: Diagnosis not present

## 2018-04-15 LAB — POCT INR: INR: 1.3 — AB (ref 2.0–3.0)

## 2018-04-15 NOTE — Patient Instructions (Signed)
Description   Spoke with Merry [facility] [place] nurse and advised to take 7.5mg  today and 7.5mg  tomorrow then continue dosage of coumadin (warfarin) 1 tablet every day except 1/2 tablet on Mondays & Fridays & educate the pt on the importance of not missing doses. Recheck INR in 1 week call to [place] office.

## 2018-04-15 NOTE — Patient Outreach (Signed)
[facility]) Care Management [place] Telephone Outreach Care Coordination x 2   04/15/2018  Megan Obrien [birth year] [id number]  Successful telephone outreach to [facility], 72 y.o.femalereferred to [place] after recent hospitalization March 19-25, 208for CVA with expressive aphasia; patient was discharged to SNF for rehabilitation and was discharged from [facility] on January 14, 2018.Patient has history including, but not limited to, DM; HTN/ HLD; GERD; mitral valve disease. HIPAA/ identity verified with patient during phone call today.  Today, [name] again reports that sheis "doingreal good," and states that her private duty caregiver took her to recent provider appointments on Friday 04/11/18 (cardiology) and on Monday 04/14/18 to new endocrinologist, Dr Warnell Forester; states both visits "went fine."  Patient states she is just waking up this morning and she  denies pain and new/ recent falls.  Patient sounds to be in no distress throughout entirety of phone call today.  Patient further reports:  -- medication change:  During time of endocrinology provider appointment, patient reports she was instructed to increase lantus dose to 30 U q am-- reports no longer taking at night, to start q am injections today, as instructed; patient denies questions and confirms that she understands proper administration of lantus, using pen injection system.  Reports that endocrinologist started new medication, Trulicity, and that she plans to call pharmacy today to inquire about the cost of this medication, as she believes she may not be able to afford it:  Discussed with patient that I would reach out to [facility] Pharmacist to contact her later this week to discuss possibility of medication assistance, should she be unable to afford this medication; explained to patient that current [facility] Pharmacist is away, and another pharmacist  would be contacting her; patient agreeable.  Confirms that she is no longer taking regular insulin throughout the day, as it was discontinued (verified). ---- reports that home health nurse continues monitoring her INR, and she tells me that her doses have been recently adjusted; patient denies questions about adjustments, states she understands and has been followed by home health nurse closely-- continues taking coumadin according to adjustments around INR  -- Continues using chart that was created during [place] initial home visit to record blood sugars at home-- we reviewed recent blood sugars recorded at home; patient has not yet had time to check this morning's reading, but reports to me that "all of the blood sugars have stayed down-- reports her fasting blood sugars readings at home have been "all at or under 200;" confirms that she "feels much better" being off regular insulin.  -- discussed and reiterated previously provided education around signs/ symptoms hypoglycemia; patient confirms that she has not had any signs/ symptoms low blood sugar, and that she continues "trying to eat" the "right food" for self-health management of DM.  -- home health services continue for PT and RN:  Reports home health RN continues monitoring her coumadin/ INR levels and relaying adjustments to dosing to her; patient reports this is going well; reports home health PT may "be ending soon."  -- private duty caregiver continues to provide transportation to all provider appointments/ errands  -- noted through review of EMR that home health RN reported recent high BP readings to patient's PCP-- PCP requested that patient monitor and record daily BP readings at home, and to contact PCP for BP readings > 140/90:  This directive was reinforced with patient during phone call today,  and patient verbalized understanding and agreement.  Patient denies further issues, concerns, or problems today. Iconfirmed that  patient hasmy direct phone number, the main [facility] CM office phone number, and the [facility] CM 24-hour nurse advice phone number should issues arise prior to next scheduled [place] CM outreach;routine home visit in 2weeks was scheduled with patient today. Encouraged patient to contact me directly if needs, questions, issues, or concerns arise prior to next scheduled outreach; patient agreed to do so.  10:20 am/ 1:40 pm:  Care Coordination outreach x 2 to Joetta Manners, [facility] Pharmacist, (covering for [name]) to update on patient's new Lantus insulin dosing and to request that call be placed to patient to address her concerns that she may not be able to afford newly added trulicity; connected with Anderson Malta at 1:40 pm, discussed changes to patient's medications as a result of endocrinology provider office visit, and confirmed that she will contact patient later this week.  Plan:  Patient will take medications as prescribed and will attend all scheduled provider appointments  Patient will continue actively participating with all home health disciplines as ordered post- SNF discharge  Patient will continue monitoring and recording blood sugars TID, or more often, as indicated  Patient will promptly notify care providers for any new concerns/ issues/ or problems that arise  Patient will actively engage with [facility] Pharmacist for medication review/ administration needs and discrepancies, possible financial assistance with newly added oral medication  Patient will continue using assistive devices for fall prevention  [facility] Community CM outreach to continue with routine home visit in 2 weeks  [facility] CM Care Plan Problem One     Most Recent Value  Care Plan Problem One  Medication mismanagement as evidenced by patient reporting/ demonstration and review of medications through EMR  Role Documenting the Problem One  Care Management Coordinator  Care Plan for Problem One  Active  [facility] Long  Term Goal   Over the next 45 days, patient will verbalize appropriate and safe medication management strategies at home, as evidenced by patient reporting and demonstration/ review of patient's medication management system at home during [place] Term Goal Start Date  04/01/18  Interventions for Problem One Long Term Goal  Discussed with patient recent provider office visits to cardiology and (new) endocrinoloy providers,  discussed changes made to long-acting insulin dose, and confirmed that patient is able to verbalize report of new dosing instructions,  discussed with patient newly added oral medication as result of endocrinology provider office visit and placed care coordination outreach to [facility] Pharmacist to follow up with patient regarding possible medication assistance for this medication,  discussed patient's recent coumadin dosing and confirmed that patient is continuing to take all medications as prescribed/ instructed    [facility] CM Care Plan Problem Two     Most Recent Value  Care Plan Problem Two  Self-health management deficit for chronic disease state of DM, as evidenced by patient reporting and ongoing elevated blood sugars    Role Documenting the Problem Two  Care Management Coordinator  Care Plan for Problem Two  Active  Interventions for Problem Two Long Term Goal   Discussed with patient recent office visit to new endocrinology provider,  new dosing instructions for lantus insulin, newly added trulicity,  encouraged patient to continue working with [facility] pharmacist for possible medication assistance with newly added oral DM medication,  confirmed that patient is no longer taking regular insulin, and has had no further  epsiodes/ signs/ sypmtoms of hypoglycemia  [facility] Long Term Goal  Over the next 45 days, patient will be able to verbalize 3 self-health management strategies for management of chronic disease state of DM, as evidenced by patient reporting during [place]  outreach [problem re-stated]  [place] Term Goal Start Date  04/01/18   [facility] CM Short Term Goal #1   Over the next 30 days, patient will monitor and record blood sugars three times daily and will record values in personal chart developed today, as evidenced by patient reporting and review of blood sugars during [facility] RN CCM outreach  [facility] CM Short Term Goal #1 Start Date  04/01/18  Interventions for Short Term Goal #2   Confirmed that patient continues monitoring and recording blood sugars using chart developed during time of [facility] CCM initial home visit,  encouraged patient to continue monitoring blood sugars, and reviewed recent blood sugars from home monitoring with patient     Oneta Rack, RN, BSN, [place] Care Management  [phone]

## 2018-04-15 NOTE — Telephone Encounter (Signed)
Noted. Patient also contacted by [facility] patient outreach nurse today, reviewed note. She is now up to 30 units on Lantus and may be starting Trulicity.

## 2018-04-15 NOTE — Telephone Encounter (Signed)
Spoken and notified patient of Megan Obrien comments. Patient verbalized understanding.  FYI.Marland KitchenMarland KitchenPateint saw edno yesterday.

## 2018-04-15 NOTE — Telephone Encounter (Signed)
Received notification from [place] that patient's BP was 186/94 when checked. Please have patient monitor her BP at home once daily and report readings at or above 140/90.

## 2018-04-16 ENCOUNTER — Telehealth: Payer: Self-pay | Admitting: Pharmacist

## 2018-04-16 DIAGNOSIS — I050 Rheumatic mitral stenosis: Secondary | ICD-10-CM | POA: Diagnosis not present

## 2018-04-16 DIAGNOSIS — I130 Hypertensive heart and chronic kidney disease with heart failure and stage 1 through stage 4 chronic kidney disease, or unspecified chronic kidney disease: Secondary | ICD-10-CM | POA: Diagnosis not present

## 2018-04-16 DIAGNOSIS — Z7901 Long term (current) use of anticoagulants: Secondary | ICD-10-CM | POA: Diagnosis not present

## 2018-04-16 DIAGNOSIS — I5030 Unspecified diastolic (congestive) heart failure: Secondary | ICD-10-CM | POA: Diagnosis not present

## 2018-04-16 DIAGNOSIS — E1122 Type 2 diabetes mellitus with diabetic chronic kidney disease: Secondary | ICD-10-CM | POA: Diagnosis not present

## 2018-04-16 DIAGNOSIS — I2510 Atherosclerotic heart disease of native coronary artery without angina pectoris: Secondary | ICD-10-CM | POA: Diagnosis not present

## 2018-04-16 DIAGNOSIS — I69320 Aphasia following cerebral infarction: Secondary | ICD-10-CM | POA: Diagnosis not present

## 2018-04-16 DIAGNOSIS — N183 Chronic kidney disease, stage 3 (moderate): Secondary | ICD-10-CM | POA: Diagnosis not present

## 2018-04-16 DIAGNOSIS — M1991 Primary osteoarthritis, unspecified site: Secondary | ICD-10-CM | POA: Diagnosis not present

## 2018-04-16 DIAGNOSIS — I69393 Ataxia following cerebral infarction: Secondary | ICD-10-CM | POA: Diagnosis not present

## 2018-04-16 DIAGNOSIS — Z794 Long term (current) use of insulin: Secondary | ICD-10-CM | POA: Diagnosis not present

## 2018-04-16 DIAGNOSIS — I2721 Secondary pulmonary arterial hypertension: Secondary | ICD-10-CM | POA: Diagnosis not present

## 2018-04-16 NOTE — Patient Outreach (Signed)
[facility]) Care Management  04/16/2018  Megan Obrien December 01, 1945 [id number]   Patient was called regarding medication assistance. HIPAA identifiers were obtained. She is a 72 y.o.year old femalewith multiple medical conditions including but not limited to: s/p CVA, HTN, GERD, Type 2 diabetes mellitus, HLD, depression and urinary incontinence  Patient with Medicare Advantage plan.   An attempt was made to review the patient's finances to assess for medication assistance as the patient was worried about the future cost of Trulicity. Her copay was $45 but she quoted the cash price as >$900.  Since the total drug cost is what is used to move patients into the donut hole, the patient was being assessed for the Trulicity Patient Assistance Program.  Unfortunately, the patient could not remember the amounts of her Social Security and retirement incomes.    Misc: When briefly reviewing the patient's medications, it was unclear if she is supposed amlodipine. She has not had it filled, it was not present during a pharmacy home visit 04/08/18, and she said today she thought it had been discontinued. However, a cardiology visit note from 04/11/18 state the patient is to continue current medications (amlodipine was listed on the active medication list and after visit summary from the 04/11/18 cardiology visit)   Plan: Patient will ask her daughter and I have scheduled a phone call with her tomorrow to further assess.

## 2018-04-17 ENCOUNTER — Other Ambulatory Visit: Payer: Self-pay | Admitting: Physician Assistant

## 2018-04-17 ENCOUNTER — Other Ambulatory Visit: Payer: Self-pay | Admitting: Pharmacist

## 2018-04-17 DIAGNOSIS — I10 Essential (primary) hypertension: Secondary | ICD-10-CM

## 2018-04-17 MED ORDER — AMLODIPINE BESYLATE 5 MG PO TABS: ORAL_TABLET | ORAL | 1 refills | Status: DC

## 2018-04-17 NOTE — Patient Outreach (Signed)
[facility]) Care Management  04/17/2018  CAMILLIA MARCY Feb 12, 1946 [id number]   Received an inbasket from [name], PA-C who saw the patient for a cardiology visit on 04/11/18.  He commented that he wanted the patient to stay on amlodipine. Patient reported she did not have any more refills so he was asked to send in a refill.  Patient was called. HIPAA identifiers were obtained. Patient was updated about Amlodipine and informed her provider would be sending in refills.    When asked about her financial information that she was supposed to get from her daughter, she said her daughter would rather me call her [name]).  [name] was called and a HIPAA compliant message was left on her voicemail.   Plan:  If I do not hear back from the patient's daughter, patient/daughter will receive a phone call from [name], PharmD who has worked with the patient in the past.  (Patient was called yesterday and today by me due to [facility] Coverage).

## 2018-04-21 DIAGNOSIS — E1122 Type 2 diabetes mellitus with diabetic chronic kidney disease: Secondary | ICD-10-CM | POA: Diagnosis not present

## 2018-04-21 DIAGNOSIS — Z7901 Long term (current) use of anticoagulants: Secondary | ICD-10-CM | POA: Diagnosis not present

## 2018-04-21 DIAGNOSIS — I5030 Unspecified diastolic (congestive) heart failure: Secondary | ICD-10-CM | POA: Diagnosis not present

## 2018-04-21 DIAGNOSIS — I2721 Secondary pulmonary arterial hypertension: Secondary | ICD-10-CM | POA: Diagnosis not present

## 2018-04-21 DIAGNOSIS — N183 Chronic kidney disease, stage 3 (moderate): Secondary | ICD-10-CM | POA: Diagnosis not present

## 2018-04-21 DIAGNOSIS — I050 Rheumatic mitral stenosis: Secondary | ICD-10-CM | POA: Diagnosis not present

## 2018-04-21 DIAGNOSIS — M1991 Primary osteoarthritis, unspecified site: Secondary | ICD-10-CM | POA: Diagnosis not present

## 2018-04-21 DIAGNOSIS — I2510 Atherosclerotic heart disease of native coronary artery without angina pectoris: Secondary | ICD-10-CM | POA: Diagnosis not present

## 2018-04-21 DIAGNOSIS — Z794 Long term (current) use of insulin: Secondary | ICD-10-CM | POA: Diagnosis not present

## 2018-04-21 DIAGNOSIS — I69393 Ataxia following cerebral infarction: Secondary | ICD-10-CM | POA: Diagnosis not present

## 2018-04-21 DIAGNOSIS — I130 Hypertensive heart and chronic kidney disease with heart failure and stage 1 through stage 4 chronic kidney disease, or unspecified chronic kidney disease: Secondary | ICD-10-CM | POA: Diagnosis not present

## 2018-04-21 DIAGNOSIS — I69320 Aphasia following cerebral infarction: Secondary | ICD-10-CM | POA: Diagnosis not present

## 2018-04-23 DIAGNOSIS — I5030 Unspecified diastolic (congestive) heart failure: Secondary | ICD-10-CM | POA: Diagnosis not present

## 2018-04-23 DIAGNOSIS — I2510 Atherosclerotic heart disease of native coronary artery without angina pectoris: Secondary | ICD-10-CM | POA: Diagnosis not present

## 2018-04-23 DIAGNOSIS — I69393 Ataxia following cerebral infarction: Secondary | ICD-10-CM | POA: Diagnosis not present

## 2018-04-23 DIAGNOSIS — N183 Chronic kidney disease, stage 3 (moderate): Secondary | ICD-10-CM | POA: Diagnosis not present

## 2018-04-23 DIAGNOSIS — Z7901 Long term (current) use of anticoagulants: Secondary | ICD-10-CM | POA: Diagnosis not present

## 2018-04-23 DIAGNOSIS — Z794 Long term (current) use of insulin: Secondary | ICD-10-CM | POA: Diagnosis not present

## 2018-04-23 DIAGNOSIS — I2721 Secondary pulmonary arterial hypertension: Secondary | ICD-10-CM | POA: Diagnosis not present

## 2018-04-23 DIAGNOSIS — M1991 Primary osteoarthritis, unspecified site: Secondary | ICD-10-CM | POA: Diagnosis not present

## 2018-04-23 DIAGNOSIS — E1122 Type 2 diabetes mellitus with diabetic chronic kidney disease: Secondary | ICD-10-CM | POA: Diagnosis not present

## 2018-04-23 DIAGNOSIS — I050 Rheumatic mitral stenosis: Secondary | ICD-10-CM | POA: Diagnosis not present

## 2018-04-23 DIAGNOSIS — I130 Hypertensive heart and chronic kidney disease with heart failure and stage 1 through stage 4 chronic kidney disease, or unspecified chronic kidney disease: Secondary | ICD-10-CM | POA: Diagnosis not present

## 2018-04-23 DIAGNOSIS — I69320 Aphasia following cerebral infarction: Secondary | ICD-10-CM | POA: Diagnosis not present

## 2018-04-24 ENCOUNTER — Telehealth: Payer: Self-pay | Admitting: *Deleted

## 2018-04-24 ENCOUNTER — Telehealth: Payer: Self-pay | Admitting: Primary Care

## 2018-04-24 NOTE — Telephone Encounter (Signed)
Notification received from [employer] that the patient's monitor has been marked as "lost." I called the patient to see if she called the company to request another return box. Her Zio was placed on 03/11/18. She was still wearing the monitor when she came back in the office on 04/11/18 to see [place], [place]. She states she had lost the box and didn't know how to send it back. I had advised her at that time to call [employer] to request another return kit. She states today that she has the kit in her closet, but was unsure what to do with it so she hasn't sent it back. I have advised her to get the box and put the monitor in it.  There should be pre-paid postage already on it, so all she has to do is drop it in the mail box. The patient voices understanding and states she will do this.

## 2018-04-24 NOTE — Telephone Encounter (Signed)
Will you please have patient contact her endocrinologist regarding this question? She may also want to mention that the Lantus is cost prohibitive.  Thanks.

## 2018-04-24 NOTE — Telephone Encounter (Signed)
Pt used the last of the Lantus insulin this morning; today FBS was 107. Due to cost of $90.00 for Lantus pt cannot pick up lantus until next week at pharmacy. Pt has Novolin R insulin already at her home and pt wants to know if can take Novolin R insulin until can pick up Lantus next week. If so how much Novolin R should pt take. Pt request cb.

## 2018-04-24 NOTE — Telephone Encounter (Signed)
Copied from [place] [id number]. Topic: Quick Communication - See Telephone Encounter >> Apr 24, 2018  9:07 AM Mylinda Latina, NT wrote: CRM for notification. See Telephone encounter for: 04/24/18. Patient called and states that she is unable to pick up her medication Insulin Glargine (LANTUS) 100 UNIT/ML Solostar Pen until next week. She is wondering if she can use the  insulin needles that she has at home until she goes to the pharmacy next week to get her pen. Patient is requesting a call back . CB# [phone]

## 2018-04-25 ENCOUNTER — Ambulatory Visit: Payer: Self-pay | Admitting: Pharmacist

## 2018-04-25 ENCOUNTER — Other Ambulatory Visit: Payer: Self-pay | Admitting: Pharmacist

## 2018-04-25 DIAGNOSIS — I480 Paroxysmal atrial fibrillation: Secondary | ICD-10-CM | POA: Diagnosis not present

## 2018-04-25 NOTE — Telephone Encounter (Signed)
Spoke with pt relaying Kate's message.  Pt states she did go ahead and get Lantus last night and she has appt with endo on Mon, 04/28/18.  FYI to [place].

## 2018-04-25 NOTE — Patient Outreach (Signed)
[facility]) Care Management  04/25/2018  SELA FALK 04-29-1946 [id number]    72 y.o. year old female referred to [facility] for Medication Assistance (Trulicity) Referred by [name], [facility] community RNCM, for medication management and medication assistance for Trulicity, Lantus.  Pharmacy home visit was conducted 04/08/18. Medications changed at endocrinology visit 03/25/62 and Trulicity was added to patient's regimen. Novolog has been discontinued due to episodes of hypoglycemia now that patient is correctly injecting Lantus. Patient expressed yesterday in phone call note to primary care provider [name] that Lantus is also expensive for her. Endocrinology appointment is scheduled for 04/28/18.  Denyse Amass left message with patient's daughter [name] per patient's request regarding financial assessment for medication assistance program eligibility (see notes 7/17 and 7/18).   Unfortunately was not able to reach [name] via telephone today. Left a brief, HIPAA compliant message introducing myself and the reason for the call. Left my call back information (unsuccessful outreach attempt #2).    Plan:  Follow up in 2-4 business days.   [name], PharmD Clinical Pharmacist, [facility] [facility] [phone]

## 2018-04-25 NOTE — Telephone Encounter (Signed)
Noted  

## 2018-04-28 ENCOUNTER — Ambulatory Visit (INDEPENDENT_AMBULATORY_CARE_PROVIDER_SITE_OTHER): Payer: Medicare Other

## 2018-04-28 DIAGNOSIS — I2510 Atherosclerotic heart disease of native coronary artery without angina pectoris: Secondary | ICD-10-CM | POA: Diagnosis not present

## 2018-04-28 DIAGNOSIS — I63429 Cerebral infarction due to embolism of unspecified anterior cerebral artery: Secondary | ICD-10-CM | POA: Diagnosis not present

## 2018-04-28 DIAGNOSIS — Z5181 Encounter for therapeutic drug level monitoring: Secondary | ICD-10-CM

## 2018-04-28 DIAGNOSIS — Z794 Long term (current) use of insulin: Secondary | ICD-10-CM | POA: Diagnosis not present

## 2018-04-28 DIAGNOSIS — I69320 Aphasia following cerebral infarction: Secondary | ICD-10-CM | POA: Diagnosis not present

## 2018-04-28 DIAGNOSIS — Z7901 Long term (current) use of anticoagulants: Secondary | ICD-10-CM | POA: Diagnosis not present

## 2018-04-28 DIAGNOSIS — I059 Rheumatic mitral valve disease, unspecified: Secondary | ICD-10-CM

## 2018-04-28 DIAGNOSIS — I69393 Ataxia following cerebral infarction: Secondary | ICD-10-CM | POA: Diagnosis not present

## 2018-04-28 DIAGNOSIS — M1991 Primary osteoarthritis, unspecified site: Secondary | ICD-10-CM | POA: Diagnosis not present

## 2018-04-28 DIAGNOSIS — E1122 Type 2 diabetes mellitus with diabetic chronic kidney disease: Secondary | ICD-10-CM | POA: Diagnosis not present

## 2018-04-28 DIAGNOSIS — I5030 Unspecified diastolic (congestive) heart failure: Secondary | ICD-10-CM | POA: Diagnosis not present

## 2018-04-28 DIAGNOSIS — N183 Chronic kidney disease, stage 3 (moderate): Secondary | ICD-10-CM | POA: Diagnosis not present

## 2018-04-28 DIAGNOSIS — I130 Hypertensive heart and chronic kidney disease with heart failure and stage 1 through stage 4 chronic kidney disease, or unspecified chronic kidney disease: Secondary | ICD-10-CM | POA: Diagnosis not present

## 2018-04-28 DIAGNOSIS — I050 Rheumatic mitral stenosis: Secondary | ICD-10-CM | POA: Diagnosis not present

## 2018-04-28 DIAGNOSIS — I2721 Secondary pulmonary arterial hypertension: Secondary | ICD-10-CM | POA: Diagnosis not present

## 2018-04-28 LAB — POCT INR: INR: 2.7 (ref 2.0–3.0)

## 2018-04-28 NOTE — Patient Instructions (Signed)
Sp[facility] with Merry Proud, [facility] and advised him continue dosage of coumadin (warfarin) 1 tablet every day except 1/2 tablet on Mondays & Fridays.  Recheck INR in 1 week call to [place] office.

## 2018-04-29 ENCOUNTER — Ambulatory Visit: Payer: Self-pay | Admitting: Pharmacist

## 2018-04-29 ENCOUNTER — Other Ambulatory Visit: Payer: Self-pay | Admitting: *Deleted

## 2018-04-29 DIAGNOSIS — I10 Essential (primary) hypertension: Secondary | ICD-10-CM

## 2018-04-29 DIAGNOSIS — Z79899 Other long term (current) drug therapy: Secondary | ICD-10-CM

## 2018-04-30 ENCOUNTER — Ambulatory Visit: Payer: Self-pay | Admitting: Pharmacist

## 2018-04-30 ENCOUNTER — Other Ambulatory Visit: Payer: Self-pay | Admitting: Pharmacist

## 2018-04-30 NOTE — Patient Outreach (Signed)
[facility]) Care Management  04/30/2018  Megan Obrien [birth year] [id number]   72 y.o. year old female referred to [place] for Medication Assistance (Trulicity) Referred by Megan Obrien, [facility] community RNCM, for medication management and medication assistance for Trulicity, Lantus.  Pharmacy home visit was conducted 04/08/18. Medications changed at endocrinology visit 01/22/52 and Trulicity was added to patient's regimen. Novolog has been discontinued due to episodes of hypoglycemia now that patient is correctly injecting Lantus. Patient expressed yesterday in phone call note to primary care provider Megan Obrien that Lantus is also expensive for her. Endocrinology appointment is scheduled for 04/28/18.  Patient requests we speak with daughter, Megan Obrien, regarding financial assessment for medication assistance program eligibility (see notes 7/17 and 7/18).   Medication Assistance Program Information:  Trulicity [employer], commercial): OOP prescription spend of $1100 (need copy of EOB or TROOP); proof of income and copy of insurance card required Lantus ([employer]): OOP prescription spend of 5% of income  Unfortunately was not able to reach Megan Obrien via telephone today. Left a brief, HIPAA compliant message introducing myself and the Obrien for the call. Left my call back information (unsuccessful outreach attempt #3).    Plan:  Follow up in 2-4 business days.   Megan Obrien, PharmD Clinical Pharmacist, [place] [facility] [phone]

## 2018-05-01 ENCOUNTER — Other Ambulatory Visit: Payer: Self-pay | Admitting: *Deleted

## 2018-05-01 ENCOUNTER — Encounter: Payer: Self-pay | Admitting: *Deleted

## 2018-05-01 ENCOUNTER — Other Ambulatory Visit: Payer: Self-pay

## 2018-05-01 ENCOUNTER — Telehealth: Payer: Self-pay | Admitting: Primary Care

## 2018-05-01 NOTE — Telephone Encounter (Signed)
Reviewed recent [facility] note which notes that she is not taking medications appropriately and is often missing evening doses. We need to get her in with me to discuss her medications. Please set her up with me for a visit next Friday the  9th at 3:40 pm. Tell her to bring ALL of her medications, including her pill boxes.

## 2018-05-01 NOTE — Patient Outreach (Signed)
[facility]) Care Management  05/01/2018  Megan Obrien [birth year] [id number]   Medication Adherence call to [name] spoke with patient she said she still has Lisinopril 40 mg and Atorvastatin 40 mg she will get pay next week and will fill all her medication then. [name] is showing past due under [place].  [place] Management Direct Dial [phone]  Fax [phone] [email or web address]

## 2018-05-01 NOTE — Patient Outreach (Signed)
[facility]) Care Management  [place] CM Routine Home Visit Care Coordination Outreach x 2 05/01/2018  Megan Obrien 1946-05-27 [id number]  TEEA DUCEY is a 72 y.o. female referred to [facility] after recent hospitalization March 19-25, 2050fr CVA with expressive aphasia; [facility].Patient has history including, but not limited to, DM; HTN/ HLD; GERD; mitral valve disease. HIPAA/ identity verified with patient in person today; patient is alone today in her home for visit today; pleasant 70 minute home visit  Today, Megan Obrien reports that sheis "doingpretty good," and states that she just woke up (11:45 am) and has not had yet had time "to get going good."  Patient reports that she has not yet checked her blood sugar this morning nor taken her morning oral medications; patient reports that she frequently "sleeps in late and goes to bed early."  Patient denies pain, new/ recent falls, and she continues using her walker for ambulation around her home; gait remains slow, steady, purposeful with witnessed ambulation in home; Fall risks/ prevention education previously provided to patient was reiterated today.  Patient is in no obvious distress throughout home visit today.  Patient reports that her private duty caregiver continues visiting her each week to assist with care needs or to transport her to provider appointments.  Subjective: "Some days I just feel like I am in a fog; I don't feel bad, I jsut feel like I am not thinking straight-- maybe it is because of the medication problems with my pill box that you have discovered today.... I don't know if I should be on all this medication for my blood pressure."  Assessment:  CDarianahas made great progress in taking her insulin appropriately and is now able to demonstrate appropriate technique of administering her daily lantus  insulin and verbalize appropriate dosing of same; although patient has shown improvement in this aspect of her medication management, several concerning discrepancies were discovered today with her oral medications and pill planner box.  Megan Obrien verbalizes that she sleeps late in the morning and goes to bed early in the evening, and continues to readily admit that she often forgets to take her evening medications.  Although she appears to be recuperating fairly well after her recent hospitalizations and she has attended provider appointments as scheduled, CRhendaappears to continue struggling with independent management of her medications.  Megan Obrien does not wish to explore option around blister packaging for her medications and verbalizes plan to have daughter assist with future pill box filling; Megan Obrien verbalizes that she believes she may be on too many medications, which has a tendency to confuse her.  Patient further reports:  -- medications: ----  confirms that she understands proper administration of lantus, using pen injection system and is able to demonstrate proper technique of administering insulin today, as she has not yet had any of her medications stating that she "just woke up."  Able to verbalize accurate reporting of newly prescribed trulicity injection; states today that her previous request for financial assistance with this medication is "no longer an issue," although she can not tell me exactly why this is no longer an issue; patient simply states, "we got it taken care of, and I can afford it now."  ----  Review of medications was completed, with the following discrepancies/ concerns noted:  1) patient still does not have amlodipine present in her house-- she again tells me  that this was discontinued by cardiology provider, however, I was able to locate a re-order for this medication by cardiology provider on 04/17/18 12:25 pm: Care Coordination outreach to [facility]  [phone]): pharmacist asst. Marye Round informs me today that there is no active order for amlodipine; states did not receive re-fill request on 04/17/18 Patient questions why she needs to be on amlodipine, given that she is already on other BP medications and "checks her BP's daily without any real high ones", which by review of recorded home values is verified today during visit:  BP readings from home noted most consistently between 120-160/60-90  2) Patients independently filled pill box has several days of gross errors in filling:  --- several evening doses have not been taken- patient again states she is "not good about" taking evening doses, as she goes to bed so early: 3 evening slots form this week's filled pill box were untouched/ not taken --- on 3 (upcoming) days of meds in pill box, carvedilol was double-dosed in evening slot, without any doses present in am slots:  I had a pointed conversation with patient that this was a serious concern and could be very dangerous; discussed that taking medications exactly as ordered is safest plan; patient remains adamant that she is unable to remember to take evening pills "every evening," and that she "thought" she had filled her pill box correctly -- several (upcoming) days, there was no lasix noted in either morning or evening slots;   I corrected patient's current pill box to reflect accurate slot filling according to current orders, and pointedly discussed my concerns around her state of pill box; explained to patient that I would be notifying her care providers around discrepancies found today and my concerns.  Discussed possibility of patient having blister packaging for medication management, but patient adamantly states she does not wish to have this option; patient reports that she will have her daughter assist her with weekly pill box filling, and states that she will inform her daughter of the concerning state I found her pill box in today-- I  encouraged patient to have daughter contact me by phone at her convenience if she would like to/ has questions.  Patient verbalizes that she "has had several days where (she) felt kind of in a fog lately," and she self-questions if it may be because she has not been taking her oral medications correctly due to incorrect pill box filling-- I confirmed for patient that that is completely possible.  2:30 pm: Care Coordination Outreach to [facility] Pharmacist Megan Obrien [phone]) to make Megan Obrien aware that patient is now reporting that she does not need financial assistance with Trulicity; and to update on concerns around oral medication management discovered today during home visit.  -- home health nurse continues monitoring her INR, states she understands how to take coumadin and has been followed by home health nurse closely-- continues taking coumadin according to adjustments around INR  -- Continues using chart that was created during [place] initial home visit to record blood sugars at home-- we reviewed recent blood sugars recorded at home, and from them, patient has shown improvement with blood sugar readings since she has been taking Lantus insulin properly; recorded home values consistently between 107-240, with only (2) isolates high values > 200, and one isolated low value of "67" where patient stated that she "felt shaky, sweaty, and nauseated."  Patient reports that she ate peanut butter during this episode and "eventually" began to feel  better with improvement of her blood sugar.  Recorded blood sugar ranges most consistently noted between 120-150 since last [place] home visit  -- using teach back method, reiterated previously provided education around signs/ symptoms hypoglycemia; patient is able to verbalize signs/ symptoms and action plan for hypoglycemia with minimal prompting  Patient denies further issues, concerns, or problems today. Iconfirmed that patient hasmy direct phone  number, the main [facility] CM office phone number, and the [facility] CM 24-hour nurse advice phone number should issues arise prior to next scheduled [place] outreach by phone in 2 weeks. Encouraged patient to contact me directly if needs, questions, issues, or concerns arise prior to next scheduled outreach; patient agreed to do so.  Objective:    BP 134/82   Pulse 70   Resp 18   SpO2 98%    Review of Systems  Constitutional: Positive for malaise/fatigue.  Respiratory: Negative for cough, shortness of breath and wheezing.   Cardiovascular: Negative for chest pain, palpitations and leg swelling.  Gastrointestinal: Negative for abdominal pain and nausea.  Genitourinary: Negative.   Musculoskeletal: Positive for joint pain. Negative for falls.       Reports ongoing chronic knee pain; unchanged from baseline; denies pain today, stating that she "just got up for the day"  Neurological: Negative.   Psychiatric/Behavioral: Negative for depression. The patient is not nervous/anxious.    Physical Exam  Constitutional: She is oriented to person, place, and time. She appears well-developed and well-nourished. No distress.  Cardiovascular: Normal rate, regular rhythm, normal heart sounds and intact distal pulses.  Respiratory: Effort normal. No respiratory distress. She has no wheezes. She has no rales.  GI: Soft. Bowel sounds are normal.  Musculoskeletal: She exhibits no edema.  Neurological: She is alert and oriented to person, place, and time.  Skin: Skin is warm and dry.  Psychiatric: She has a normal mood and affect. Her behavior is normal.   Encounter Medications:   Outpatient Encounter Medications as of 05/01/2018  Medication Sig Note  . acetaminophen (TYLENOL) 650 MG CR tablet Take 500 mg by mouth every 8 (eight) hours as needed for pain.    [name] atorvastatin (LIPITOR) 40 MG tablet Take 1 tablet (40 mg total) by mouth at bedtime. 04/08/2018: Switch to AM dosing  . buPROPion (WELLBUTRIN SR)  150 MG 12 hr tablet TAKE 1 TABLET BY MOUTH TWICE DAILY 04/08/2018: Often forgets evening doses  . carvedilol (COREG) 12.5 MG tablet Take 18.75 mg by mouth 2 (two) times daily. 04/01/2018: Patient reports that she has difficulty remembering to take pm dose-- states she goes to bed so early, she often forgets; pill box  review indicates missed does for both Sunday and Monday nights of this week  . Cholecalciferol (VITAMIN D3) 2000 units CHEW Chew by mouth. Once a day   . ferrous sulfate 325 (65 FE) MG tablet Take 325 mg by mouth daily. 05/01/2018: Reports is currently out of this medication  . FLUoxetine (PROZAC) 40 MG capsule Take 1 capsule by mouth once daily for depression and anxiety.   . fluticasone (FLONASE) 50 MCG/ACT nasal spray Place 1 spray into both nostrils daily. 04/01/2018: Patient reports has not needed recently  . furosemide (LASIX) 40 MG tablet Take 40 mg by mouth daily.   . Insulin Glargine (LANTUS) 100 UNIT/ML Solostar Pen Inject 15 Units into the skin every evening. 04/15/2018: Patient reports this dose was increased to 30 U q am on 04/15/18 by Dr. Pasty Arch-- patient now taking q  am (not qHS)  . lisinopril (PRINIVIL,ZESTRIL) 40 MG tablet Take 40 mg by mouth daily.   . Melatonin 10 MG TABS Take 10 mg by mouth at bedtime.   [name] omega-3 acid ethyl esters (LOVAZA) 1 g capsule Take 1 capsule by mouth daily with food for cholesterol.   [name] omeprazole (PRILOSEC) 40 MG capsule Take 40 mg by mouth daily.   [name] oxybutynin (DITROPAN-XL) 5 MG 24 hr tablet Take 1 tablet by mouth at bedtime for urinary incontinence.   . triamcinolone cream (KENALOG) 0.1 % Apply 1 application topically 2 (two) times daily. 01/28/2018: Per pt as needed.   . TRULICITY 5.97 CB/6.3AG SOPN 0.75 mg once a week.   . warfarin (COUMADIN) 5 MG tablet TAKE 1 TABLET BY MOUTH AS DIRECTED BY  COUMADIN  CLINIC   . ACCU-CHEK AVIVA PLUS test strip As directed   . amLODipine (NORVASC) 5 MG tablet Take 1 tablet by mouth daily for blood pressure.  (Patient not taking: Reported on 05/01/2018) 05/01/2018: This medication is not present in patient's home; appears it was re-ordered by Megan Obrien/ cardiology on 04/17/18-- [place] contacted 05/01/18 and stated there is no active order for this medication  . enoxaparin (LOVENOX) 120 MG/0.8ML injection Inject 0.8 mLs (120 mg total) into the skin every 12 (twelve) hours. (Patient not taking: Reported on 05/01/2018) 04/08/2018: Patient does have remaining boxes in the home but states she is NOT using (because of her warfarin)  . Insulin Pen Needle 32G X 6 MM MISC Use as instructed to inject Lantus daily. Dx is E11.8    No facility-administered encounter medications on file as of 05/01/2018.    Functional Status:   In your present state of health, do you have any difficulty performing the following activities: 01/28/2018 12/18/2017  Hearing? Y -  Comment left ear  -  Vision? N -  Difficulty concentrating or making decisions? N -  Walking or climbing stairs? Y -  Dressing or bathing? N -  Doing errands, shopping? [name]  Preparing Food and eating ? Y -  Using the Toilet? N -  In the past six months, have you accidently leaked urine? Y -  Do you have problems with loss of bowel control? N -  Managing your Medications? N -  Managing your Finances? Y -  Housekeeping or managing your Housekeeping? Y -  Some recent data might be hidden   Fall/Depression Screening:    Fall Risk  05/01/2018 04/08/2018 04/01/2018  Falls in the past year? (No Data) (No Data) (No Data)  Comment Denies new/ recent falls denies new/ recent falls Denies new/ recent falls  Number falls in past yr: - - (No Data)  Comment - - Fall Risk evaluation completed  Injury with Fall? - - -  Risk for fall due to : Impaired balance/gait;Medication side effect - Impaired balance/gait;Impaired mobility;Other (Comment)  Risk for fall due to: Comment previously provided fall risks/ prevention education reiterated with patient today - Recent CVA    PHQ 2/9 Scores 01/20/2018 01/15/2017 09/11/2016 07/24/2016  PHQ - 2 Score 0 4 0 0  PHQ- 9 Score - 13 - -   Plan:  Patient will take medications as prescribed and will attend all scheduled provider appointments  Patient will continue actively participating with all home health disciplines as ordered post- SNF discharge  Patient will continue monitoring and recording blood sugars TID, or more often, as indicated  Patient will promptly notify care providers for any new concerns/ issues/  or problems that arise  Patient will continue communicating with [facility] Pharmacist for medication review/ administration needs and discrepancies  Patient will continue using assistive devices for fall prevention  I will make patient's care providers aware of discrepancies discovered today with patient's oral medications; will share home visit notes and care plan with providers  [facility] CM outreach to continue with scheduled phone call in 2 weeks  [facility] CM Care Plan Problem One     Most Recent Value  Care Plan Problem One  Medication mismanagement as evidenced by patient reporting/ demonstration and review of medications through EMR  Role Documenting the Problem One  Care Management Coordinator  Care Plan for Problem One  Active  [facility] Long Term Goal   Over the next 45 days, patient will verbalize appropriate and safe medication management strategies at home, as evidenced by patient reporting and demonstration/ review of patient's medication management system at home during [place] Term Goal Start Date  04/01/18  Interventions for Problem One Long Term Goal  Confirmed that patient is able to accurately verbalize instructions for current insulin/ Trulicity and observed patient inject lantus insulin this morning to confirm that her technique is appropriate,  reviewed patient's pill box that she has been independently filling, and discovered several concerning discrepancies around  pill box filling which were not present at time of [facility] CCM initial home visit in early July-- corrected patient's pill box, discussed with patient my concern about the discrepancies I found and possible detrimental outcomes form patient not taking her medications as instructed,  placed care coordination outreach calls to patient's pharmacy, to [facility] Pharmacist, and sent patient's cardiology and PCP providers message/ notes form visit detailing my concerns after today's home visit with patient    [facility] CM Care Plan Problem Two     Most Recent Value  Care Plan Problem Two  Self-health management deficit for chronic disease state of DM, as evidenced by patient reporting and ongoing elevated blood sugars  [problem re-stated]  Role Documenting the Problem Two  Care Management Coordinator  Care Plan for Problem Two  Active  Interventions for Problem Two Long Term Goal   Using teach back method, discussed patient's use of lantus insulin and newly prescribed Trulicity,  confirmed that patient is able to verbalize accuarte dosing of both, and observed patient administer Lantus insulin,  discussed with patient previously provided education around signs/ symptoms low blood sugar with action plan for same  [facility] Long Term Goal  Over the next 45 days, patient will be able to verbalize 3 self-health management strategies for management of chronic disease state of DM, as evidenced by patient reporting during [facility] RN CCM outreach  [facility] Long Term Goal Start Date  04/01/18  [facility] CM Short Term Goal #1   Over the next 30 days, patient will monitor and record blood sugars three times daily and will record values in personal chart developed today, as evidenced by patient reporting and review of blood sugars during [facility] RN CCM outreach  [facility] CM Short Term Goal #1 Start Date  04/01/18  [facility] CM Short Term Goal #1 Met Date   05/01/18 [Goal met]  Interventions for Short Term Goal #2   Reviewed with patient all recently recorded blood sugars  and confirmed that she continues using clear and understandable method of recording blood sugars     Oneta Rack, RN, BSN, [employer] Coordinator [facility] Care Management  [phone]

## 2018-05-01 NOTE — Patient Outreach (Signed)
[place] [facility]) Care Management [place] Telephone Outreach Care Coordination  05/01/2018  Megan Obrien 09/12/1946 [id number]  12:50 pm:  Care Coordination Outreach to "[place]" [facility]) nurse with [facility] [phone]) re: Megan Obrien is a 72 y.o. female referred to [place] after recent hospitalization March 19-25, 2011for CVA with expressive aphasia; patient was discharged to SNF for rehabilitation and was discharged from [facility] on January 14, 2018.Patient has history including, but not limited to, DM; HTN/ HLD; GERD; mitral valve disease.   Left Merry Proud a voice mail message after leaving patient's home this afternoon, to collaborate/ update him on multiple medication discrepancies discovered during home visit with patient; requested call-back for collaboration.  Plan:  Will await call-back from [facility] RN for purpose of care collaboration around discrepancies discovered today with patient's medications  Oneta Rack, RN, BSN, [place] Coordinator [facility] Care Management  [phone]

## 2018-05-02 ENCOUNTER — Other Ambulatory Visit: Payer: Self-pay | Admitting: Pharmacist

## 2018-05-02 ENCOUNTER — Ambulatory Visit: Payer: Self-pay | Admitting: Pharmacist

## 2018-05-02 NOTE — Patient Outreach (Signed)
[facility]) Care Management  05/02/2018  Megan Obrien [birth year] [id number]   72 y.o. year old female referred to [place] for Medication Adherence and Care Coordination  Contacting the patient following a care coordination call with [name], [facility] community RNCM yesterday. [name] had a home visit with patient yesterday and noted many discrepancies in patient's pill box. This is a new development, prior home visits conducted by both [name] and myself did not uncover large pill box errors. [name] contacted [name] (cardiology) and patient's primary doctor [name] additionally. New pharmacy plan moving forward will be to consolidate patient's pill regimen to once daily tablets, with need to minimize changes in her routine. Possibly consider pill packaging, though [name] declined pill packing yesterday with [name].   Additionally, I have been trying to contact patient's daughter [name] regarding patient's financial information for medication assistance programs for Trulicity and Lantus (see past notes for further detail).   Also noted [facility] pharmacy technician [name] contacted patient per her [facility] medication adherence campaign regarding patient's lisinopril and atorvastatin.  Was unable to reach patient via telephone today and have left HIPAA compliant voicemail asking patient to return my call. (unsuccessful outreach #4).  Plan: Will follow-up within 2-4  business days via telephone. Noted that [name] has plans to schedule a medication review visit next Friday August 9. Hope to attend this visit with patient to provide support. Patient also has endocrinology visit scheduled for Monday.    [name], PharmD Clinical Pharmacist, [facility] [phone]

## 2018-05-05 ENCOUNTER — Other Ambulatory Visit: Payer: Self-pay | Admitting: Primary Care

## 2018-05-05 ENCOUNTER — Other Ambulatory Visit: Payer: Self-pay | Admitting: Physician Assistant

## 2018-05-05 ENCOUNTER — Other Ambulatory Visit: Payer: Self-pay | Admitting: Cardiovascular Disease

## 2018-05-05 DIAGNOSIS — Z794 Long term (current) use of insulin: Secondary | ICD-10-CM | POA: Diagnosis not present

## 2018-05-05 DIAGNOSIS — E782 Mixed hyperlipidemia: Secondary | ICD-10-CM | POA: Diagnosis not present

## 2018-05-05 DIAGNOSIS — F331 Major depressive disorder, recurrent, moderate: Secondary | ICD-10-CM

## 2018-05-05 DIAGNOSIS — E1165 Type 2 diabetes mellitus with hyperglycemia: Secondary | ICD-10-CM | POA: Diagnosis not present

## 2018-05-05 DIAGNOSIS — I10 Essential (primary) hypertension: Secondary | ICD-10-CM | POA: Diagnosis not present

## 2018-05-05 NOTE — Telephone Encounter (Signed)
Refill request

## 2018-05-06 ENCOUNTER — Other Ambulatory Visit: Payer: Self-pay | Admitting: Pharmacist

## 2018-05-07 ENCOUNTER — Other Ambulatory Visit: Payer: Self-pay | Admitting: *Deleted

## 2018-05-07 DIAGNOSIS — I69320 Aphasia following cerebral infarction: Secondary | ICD-10-CM | POA: Diagnosis not present

## 2018-05-07 DIAGNOSIS — I2510 Atherosclerotic heart disease of native coronary artery without angina pectoris: Secondary | ICD-10-CM | POA: Diagnosis not present

## 2018-05-07 DIAGNOSIS — Z794 Long term (current) use of insulin: Secondary | ICD-10-CM | POA: Diagnosis not present

## 2018-05-07 DIAGNOSIS — M1991 Primary osteoarthritis, unspecified site: Secondary | ICD-10-CM | POA: Diagnosis not present

## 2018-05-07 DIAGNOSIS — N183 Chronic kidney disease, stage 3 (moderate): Secondary | ICD-10-CM | POA: Diagnosis not present

## 2018-05-07 DIAGNOSIS — I050 Rheumatic mitral stenosis: Secondary | ICD-10-CM | POA: Diagnosis not present

## 2018-05-07 DIAGNOSIS — Z7901 Long term (current) use of anticoagulants: Secondary | ICD-10-CM | POA: Diagnosis not present

## 2018-05-07 DIAGNOSIS — E1122 Type 2 diabetes mellitus with diabetic chronic kidney disease: Secondary | ICD-10-CM | POA: Diagnosis not present

## 2018-05-07 DIAGNOSIS — I69393 Ataxia following cerebral infarction: Secondary | ICD-10-CM | POA: Diagnosis not present

## 2018-05-07 DIAGNOSIS — I10 Essential (primary) hypertension: Secondary | ICD-10-CM

## 2018-05-07 DIAGNOSIS — I130 Hypertensive heart and chronic kidney disease with heart failure and stage 1 through stage 4 chronic kidney disease, or unspecified chronic kidney disease: Secondary | ICD-10-CM | POA: Diagnosis not present

## 2018-05-07 DIAGNOSIS — I2721 Secondary pulmonary arterial hypertension: Secondary | ICD-10-CM | POA: Diagnosis not present

## 2018-05-07 DIAGNOSIS — I5030 Unspecified diastolic (congestive) heart failure: Secondary | ICD-10-CM | POA: Diagnosis not present

## 2018-05-07 LAB — POCT INR: INR: 3.2 — AB (ref 2.0–3.0)

## 2018-05-07 MED ORDER — AMLODIPINE BESYLATE 5 MG PO TABS: ORAL_TABLET | ORAL | 3 refills | Status: DC

## 2018-05-07 NOTE — Telephone Encounter (Signed)
Yes, okay to keep at 3:20. Thanks.

## 2018-05-07 NOTE — Telephone Encounter (Signed)
Was able to contact patient this afternoon. 3:40 was taken, so we scheduled for 3:20. Is this ok?

## 2018-05-08 ENCOUNTER — Ambulatory Visit (INDEPENDENT_AMBULATORY_CARE_PROVIDER_SITE_OTHER): Payer: Medicare Other | Admitting: Cardiovascular Disease

## 2018-05-08 DIAGNOSIS — Z5181 Encounter for therapeutic drug level monitoring: Secondary | ICD-10-CM | POA: Diagnosis not present

## 2018-05-08 DIAGNOSIS — I63429 Cerebral infarction due to embolism of unspecified anterior cerebral artery: Secondary | ICD-10-CM

## 2018-05-08 DIAGNOSIS — I059 Rheumatic mitral valve disease, unspecified: Secondary | ICD-10-CM

## 2018-05-08 NOTE — Patient Outreach (Signed)
[facility]) Care Management  05/08/2018  Megan Obrien 03-09-1946 [id number]  Follow up call with patient regarding medication adherence. See note 05/02/18 for detail.   Patient verified identity with HIPAA identifiers x 2.   Patient unaware that [name] would like to see her for a medication management visit. Patient states that Dr. Pasty Arch changed her medications during an endocrinology appointment 05/05/18.   I called [place] at [facility] and patient's medication adherence appointment was never finalized. Called patient back and asked her to please schedule the appointment with [name]. I will attend the visit.   Appointment scheduled for Friday at 3:20pm.   [name], PharmD Clinical Pharmacist, [place] [phone]

## 2018-05-09 ENCOUNTER — Ambulatory Visit: Payer: Medicare Other | Admitting: Primary Care

## 2018-05-09 ENCOUNTER — Ambulatory Visit: Payer: Self-pay | Admitting: Pharmacist

## 2018-05-12 DIAGNOSIS — L851 Acquired keratosis [keratoderma] palmaris et plantaris: Secondary | ICD-10-CM | POA: Diagnosis not present

## 2018-05-12 DIAGNOSIS — B351 Tinea unguium: Secondary | ICD-10-CM | POA: Diagnosis not present

## 2018-05-12 DIAGNOSIS — Z794 Long term (current) use of insulin: Secondary | ICD-10-CM | POA: Diagnosis not present

## 2018-05-12 DIAGNOSIS — E1140 Type 2 diabetes mellitus with diabetic neuropathy, unspecified: Secondary | ICD-10-CM | POA: Diagnosis not present

## 2018-05-14 ENCOUNTER — Other Ambulatory Visit: Payer: Self-pay | Admitting: *Deleted

## 2018-05-14 ENCOUNTER — Ambulatory Visit (INDEPENDENT_AMBULATORY_CARE_PROVIDER_SITE_OTHER): Payer: Medicare Other

## 2018-05-14 ENCOUNTER — Encounter: Payer: Self-pay | Admitting: *Deleted

## 2018-05-14 DIAGNOSIS — Z5181 Encounter for therapeutic drug level monitoring: Secondary | ICD-10-CM | POA: Diagnosis not present

## 2018-05-14 DIAGNOSIS — I050 Rheumatic mitral stenosis: Secondary | ICD-10-CM | POA: Diagnosis not present

## 2018-05-14 DIAGNOSIS — E1122 Type 2 diabetes mellitus with diabetic chronic kidney disease: Secondary | ICD-10-CM | POA: Diagnosis not present

## 2018-05-14 DIAGNOSIS — I2510 Atherosclerotic heart disease of native coronary artery without angina pectoris: Secondary | ICD-10-CM | POA: Diagnosis not present

## 2018-05-14 DIAGNOSIS — Z794 Long term (current) use of insulin: Secondary | ICD-10-CM | POA: Diagnosis not present

## 2018-05-14 DIAGNOSIS — I69320 Aphasia following cerebral infarction: Secondary | ICD-10-CM | POA: Diagnosis not present

## 2018-05-14 DIAGNOSIS — I2721 Secondary pulmonary arterial hypertension: Secondary | ICD-10-CM | POA: Diagnosis not present

## 2018-05-14 DIAGNOSIS — Z7901 Long term (current) use of anticoagulants: Secondary | ICD-10-CM | POA: Diagnosis not present

## 2018-05-14 DIAGNOSIS — I059 Rheumatic mitral valve disease, unspecified: Secondary | ICD-10-CM

## 2018-05-14 DIAGNOSIS — I130 Hypertensive heart and chronic kidney disease with heart failure and stage 1 through stage 4 chronic kidney disease, or unspecified chronic kidney disease: Secondary | ICD-10-CM | POA: Diagnosis not present

## 2018-05-14 DIAGNOSIS — I69393 Ataxia following cerebral infarction: Secondary | ICD-10-CM | POA: Diagnosis not present

## 2018-05-14 DIAGNOSIS — I63429 Cerebral infarction due to embolism of unspecified anterior cerebral artery: Secondary | ICD-10-CM

## 2018-05-14 DIAGNOSIS — M1991 Primary osteoarthritis, unspecified site: Secondary | ICD-10-CM | POA: Diagnosis not present

## 2018-05-14 DIAGNOSIS — N183 Chronic kidney disease, stage 3 (moderate): Secondary | ICD-10-CM | POA: Diagnosis not present

## 2018-05-14 DIAGNOSIS — I5030 Unspecified diastolic (congestive) heart failure: Secondary | ICD-10-CM | POA: Diagnosis not present

## 2018-05-14 LAB — POCT INR: INR: 2.9 (ref 2.0–3.0)

## 2018-05-14 NOTE — Patient Instructions (Signed)
Spoke with Merry Proud, [facility] nurse and advised him have pt continue dosage of coumadin (warfarin) 1 tablet every day except 1/2 tablet on Mondays & Fridays.  Recheck INR in 2 weeks call to [facility] office, as pt is d/c'd from his care today.

## 2018-05-14 NOTE — Patient Outreach (Signed)
[facility]) Care Management [place] Telephone Outreach Care Coordination x 1   05/14/2018  Megan Obrien July 18, 1946 [id number]  Successful telephone outreach to Megan Obrien, 72 y.o. female referred to [place] after recent hospitalization March 19-25, 2037for CVA with expressive aphasia; patient was discharged to SNF for rehabilitation and was discharged from [facility] on January 14, 2018.Patient has history including, but not limited to, DM; HTN/ HLD; GERD; mitral valve disease. HIPAA/ identity verified with patient during phone call today.  Today, Megan Obrien that sheis "doingreal good," and "things are going just fine."  Patient sounds to be in no obvious distress throughout phone call today.  Patient further reports:  -- medications: ----  states that she understands all medications and has been being "very careful" with filling her pill box since last [place] home visit; states that her daughter has been unable to assist her in filling pill box, but adds that she "believes" she is correctly filling the box "on her own." ---- states that after taking this morning's dose of Lantus insulin, she has run out and will not be able to re-fill this medication "until this coming Friday" (2 days from now); patient attempts to tell me that "someone" (she can not tell me whom) told her to "just take the other insulin" that is in her home.  I had the patient read to me what insulin she plans on using (currently reported in her home ) now that she has run out of Lantus pens; it is Regular insulin, which patient is no longer prescribed.  I could not find evidence in EMR that any provider instructed her to use Regular insulin in place of Lantus insulin; again, patient can not remember "exactly who" told" her to do this, nor what dose she was told to take; I cautioned patient against taking regular insulin, without  first obtaining specific instructions from her endocrinologist/ PCP, and thoroughly reviewed with patient the difference between regular and long-acting insulin; questioned that patient is able to understand this, as she states over and over, "this is all just so confusing."  I explained to patient that Regular insulin is short acting and can be very dangerous if not used exactly as prescribed.  12:45 pm care coordination outreach:  Luckily, during our phone call, her home health nurse Megan Obrien arrived; I spoke to Megan Obrien and made him aware of the report patient has provided me today around insulin; Megan Obrien agrees to counsel patient and to instruct her NOT to take regular insulin unless advised by care provider; we discussed option of removing regular insulin from patient's home for safety purposes if necessary.  Megan Obrien agreed to follow up with patient's endocrinologist if necessary after he is able to talk with patient face to face to try and sort this out, and after he visualizes state of medications present in patient's home.  Megan Obrien agrees to contact me back in follow up to his home visit today-- which is planned as home health discharge visit.     Patient continues to verbalize that she "still has had several days where (she) felt kind of in a fog lately," as well as "sometimes feel a little dizzy now and then;" but she again stated that she believes she is taking her oral medications correctly.  I repeatedly stressed with patient importance of making sure she attends upcoming PCP appointment for medication review and also provided extensive and ongoing education around different  types of insulin, which could be dangerous if not taken exactly the way they are prescribed.  Patient agrees that she will make sure she attends upcoming scheduled PCP office visit.  -- Provider appointments:  Patient initially told me she did not have any upcoming provider appointments:  I clarified through review of EMR that patient has  scheduled PCP appointment for 05/26/18 at 10:40 am-- with much effort, I was finally able to convince Megan Obrien that this appointment was already scheduled, and that it would be in her best interest to attend for medication review, given the multiple recent discrepancies around her medications.  Patient finally wrote this appointment down on her calendar and stated she would be sure to make transportation arrangements and attend as scheduled; encouraged patient to take all of her medications, her pill box, and her blood sugar readings from home with her to appointment, and patient agreed to do.  -- home health nurse discharge home visit currently in progress:  Spoke with [facility] RN, who stated that patient would now be going to clinic for monitoring of INR, after today  -- Prior to speaking with home health RN, patient reportscontinues using chart that was created during [place] home visit to record blood sugars at home--we reviewed recent blood sugars recorded at home, and patient confirms that she has continued monitoring and recording blood sugars 2-3 times daily.  Reports fasting blood sugars between 136-209, with ranges "mainly between 150-170 fasting."  Reports only 2 isolated readings > 200; reports lowest blood sugar at "136" with this morning's reading of "159."  Patient denies further issues, concerns, or problems today. Iconfirmed that patient hasmy direct phone number, the main [facility] CM office phone number, and the [facility] CM 24-hour nurse advice phone number should issues arise prior to next scheduled [place] outreach by phone later this week to reinforce everything discussed during phone call today. Encouraged patient to contact me directly if needs, questions, issues, or concerns arise prior to next scheduled outreach; patient agreed to do so.  Plan:  Patient will take medications as prescribed and will attend all scheduled provider appointments  Patient will continue monitoring  and recording blood sugars TID, or more often, as indicated  Patient will promptly notify care providers for any new concerns/ issues/ or problems that arise  Patient will continue communicating with [facility] Pharmacist for medication review/ administration needs and discrepancies  Patient will continue using assistive devices for fall prevention  I will make patient's care providers aware of patient's report around her insulin today; will share phone call notes and care plan with providers and [place] outreachto continue with scheduled phone call later this week  [facility] CM Care Plan Problem One     Most Recent Value  Care Plan Problem One  Medication mismanagement as evidenced by patient reporting/ demonstration and review of medications through EMR  Role Documenting the Problem One  Care Management [place] for Problem One  Active  [facility] Long Term Goal   Over the next 31 days, patient will verbalize appropriate and safe medication management strategies at home, as evidenced by patient reporting and demonstration/ review of patient's medication management system at home during [place] outreach [Goal extended today]  [facility] Long Term Goal Start Date  05/14/18 [Goal extended today]  Interventions for Problem One Long Term Goal  Discussed recent changes to medications for DM with patient,  discussed patient's pan to use regular insulin  tomorrow morning until she can get her Lantus insulin pen refilled on Friday,  care coordination outreach to home health nurse who arrived at patient's home during our phone call,  extensively discussed safe use of insulin and different types of insulin with patient today   [facility] CM Short Term Goal #1   Over the next 14 days, patient will attend PCP office visit as scheduled for medication review, as evidenced by patient reporting and review of EMR/ collaboration with PCP as indicated during [place] outreach  [facility] CM Short Term  Goal #1 Start Date  05/14/18  Interventions for Short Term Goal #1  Confirmed for patient time of upcoming scheduled PCP office visit,  strongly encouraged patient to make sure she attends this appointment as scheduled and instructed her to take all of her medications, including her pill box with her to appointment,  shared today's [facility] RN CCM notes with PCP and [facility] Pharmacist as Juluis Rainier    [facility] CM Care Plan Problem Two     Most Recent Value  Care Plan Problem Two  Self-health management deficit for chronic disease state of DM, as evidenced by patient reporting and ongoing elevated blood sugars    Role Documenting the Problem Two  Care Management Coordinator  Care Plan for Problem Two  Active  Interventions for Problem Two Long Term Goal   Confirmed that patient has continued monitoring and recording blood sugars 2-3 times daily,  reviewed recently recorded blood sugars at home with patient,  had extensive discussion with patient around different types of insulin and need for patient to promptly obtain re-fill of lantus insulin that she reports she took last does of this morning  [facility] Long Term Goal  Over the next 31 days, patient will be able to verbalize 3 self-health management strategies for management of chronic disease state of DM, as evidenced by patient reporting during [facility] RN CCM outreach [Goal extended today]  [facility] Long Term Goal Start Date  05/14/18 [Goal extended today]     Oneta Rack, RN, BSN, [place] Coordinator [facility] Care Management  [phone]

## 2018-05-15 ENCOUNTER — Ambulatory Visit: Payer: Self-pay | Admitting: Pharmacist

## 2018-05-15 ENCOUNTER — Other Ambulatory Visit: Payer: Self-pay | Admitting: *Deleted

## 2018-05-15 ENCOUNTER — Other Ambulatory Visit: Payer: Self-pay | Admitting: Primary Care

## 2018-05-15 DIAGNOSIS — E118 Type 2 diabetes mellitus with unspecified complications: Secondary | ICD-10-CM

## 2018-05-15 DIAGNOSIS — G4730 Sleep apnea, unspecified: Secondary | ICD-10-CM | POA: Diagnosis not present

## 2018-05-15 NOTE — Telephone Encounter (Signed)
Last prescribed on 02/06/2018 Last office visit on 03/24/2018

## 2018-05-15 NOTE — Telephone Encounter (Signed)
Will send to endocrinology. Appreciate their consultation.

## 2018-05-15 NOTE — Patient Outreach (Signed)
[facility]) Care Management [place] Telephone [facility] Coordination  05/15/2018  JOSELYNNE KILLAM March 24, 1946 [id number]  Successful telephone outreach to [facility], 72 y.o.femalereferred to [place] after recent hospitalization March 19-25, 2076for CVA with expressive aphasia; patient was discharged to SNF for rehabilitation and was discharged from [facility] on January 14, 2018.Patient has history including, but not limited to, DM; HTN/ HLD; GERD; mitral valve disease. HIPAA/ identity verified with patient during phone call today.  Today, [name] that sheis "doingfine" and she confirms that after my call to her yesterday (where she reported that she had planned to substitute regular insulin for her Lantus pen for today's dose) the home health nurse disposed of her pre-filled Regular insulin syringes and reinforced instructions I had provided to NOT use regular insulin in place of lantus insulin.  Patient further reports that while home health nurse was at her home, she looked in her bathroom and discovered that she in fact DID have one more un-opened lantus pen available for her use.  Patient reports that she took her lantus insulin as prescribed this morning, and has placed re-fill phone call to her pharmacy, and that lantus was successfully re-filled today, and should be ready for pick up soon-- prior to her needing more.  Patient stated that her private duty caregiver will transport her to pharmacy to pick up refill of lantus pen either tomorrow or on Monday 05/19/18.  Using teachback method, I confirmed today several times today that patient verbalizes/ understands to never substitute Regular insulin for lantus insulin and that patient is able to accurately verbalize current dosing for lantus insulin, has enough lantus insulin at her home to last until next re-fill, and is taking as prescribed.  I also confirmed that patient has added  upcoming appointment with PCP 05/26/18 to her calendar and that her caregiver will transport her; we then scheduled next [place] home visit post- PCP appointment, on Wednesday 05/28/18, to review any changes/ check medications/ check blood sugars/ provide ongoing education around self-health management of DM.  Plan:  Patient will take medications as prescribed and will attend all scheduled provider appointments  Patient will continue monitoring and recording blood sugars TID, or more often, as indicated  Patient will promptly notify care providers for any new concerns/ issues/ or problems that arise  Patient willcontinue communicatingwith [facility] Pharmacist for medication review/ administration needs and discrepancies  Patient will continue using assistive devices for fall prevention  I will update patient's care providers of today's report from patient that she has located and is taking lantus insulin as prescribed  [facility] CM outreachto continue withscheduled home visit in 2 weeks, post-scheduled PCP office visit 05/26/18  Oneta Rack, RN, BSN, [place] Care Management  [phone]

## 2018-05-16 ENCOUNTER — Other Ambulatory Visit: Payer: Self-pay | Admitting: *Deleted

## 2018-05-16 NOTE — Patient Outreach (Signed)
[facility]) Care Management [place] Telephone [facility] Coordination  05/16/2018  JAKELIN TAUSSIG 05-May-1946 [id number]  Unsuccessful telephone outreach for care coordination x 4  to "Heather" at [facility]/ Dr. Pasty Arch [phone]) re: Etta Quill y.o.femalereferred to [place] after recent hospitalization March 19-25, 2079for CVA with expressive aphasia; patient was discharged to SNF for rehabilitation and was discharged from [facility] on January 14, 2018.Patient has history including, but not limited to, DM; HTN/ HLD; GERD; mitral valve disease.   Received off-hours voicemail message from [place] on 05/15/18, requesting call-back; attempted 4 calls back to [facility] without success-- with each time, was placed on extended hold (>10 minutes) without pick up or answer from staff.    Eventually, on 4th attempt to office, I spoke with receptionist Nate, who stated he would give my message to [facility]; in the meantime, I copied/ sent the 05/15/18 note from the follow up call with patient directly to [facility] via secure messaging through EMR.  Plan:  Will continue efforts to collaborate with endocrinology provider as indicated  Oneta Rack, RN, BSN, [place] Care Management  [phone]

## 2018-05-19 ENCOUNTER — Ambulatory Visit: Payer: Self-pay | Admitting: Pharmacist

## 2018-05-20 ENCOUNTER — Other Ambulatory Visit: Payer: Self-pay | Admitting: Pharmacist

## 2018-05-21 ENCOUNTER — Encounter: Payer: Self-pay | Admitting: Pharmacist

## 2018-05-21 ENCOUNTER — Other Ambulatory Visit: Payer: Self-pay | Admitting: Pharmacist

## 2018-05-21 NOTE — Patient Outreach (Signed)
[facility]) Care Management  05/21/2018  Megan Obrien 1945-10-23 [id number]   72 y.o. year old female referred to [place] for Medication Adherence and Case Closure Referred by [name], [facility] for medication management. Pharmacy home visit conducted 04/08/2018.   Patient with [employer].   Patient confirms identity with HIPAA-identifiers x2 and gives verbal consent to speak over the phone about medications.   Medication Adherence: Patient uses pill box for her medications. Frequently expresses a "foggy" feeling and other times having clarity. I have been working closely with [name] regarding patient's medication adherence.   Today, patient states she has been doing very well with medication adherence and medication management. She reports NO missed evening doses of her medications.   We discussed in depth her blood sugars and insulins and how they work. We also discussed in depth the importance of taking her medications every day, including the evening doses and bringing ALL medications to her upcoming appointment with [name]. It appears in our conversation that although we changed her atorvastatin to the morning during my home visit with her, she has reverted to take it in the evening (per the prescription bottle).  Patient repeats that she feels she has been really doing well at her medication management and adherence in the past week. At this time, there are no identifiable [facility] clinical pharmacy needs. [name] is very involved with the [facility] medication adherence conversation. I will close the pharmacy episode for now, but I am happy to get involved in the future should the patient have any needs.   [name], PharmD Clinical Pharmacist, [place] [facility] [phone]

## 2018-05-26 ENCOUNTER — Telehealth: Payer: Self-pay | Admitting: Primary Care

## 2018-05-26 ENCOUNTER — Ambulatory Visit (INDEPENDENT_AMBULATORY_CARE_PROVIDER_SITE_OTHER): Payer: Medicare Other | Admitting: Primary Care

## 2018-05-26 ENCOUNTER — Ambulatory Visit (INDEPENDENT_AMBULATORY_CARE_PROVIDER_SITE_OTHER): Payer: Medicare Other

## 2018-05-26 ENCOUNTER — Encounter: Payer: Self-pay | Admitting: Primary Care

## 2018-05-26 DIAGNOSIS — Z5181 Encounter for therapeutic drug level monitoring: Secondary | ICD-10-CM

## 2018-05-26 DIAGNOSIS — I059 Rheumatic mitral valve disease, unspecified: Secondary | ICD-10-CM | POA: Diagnosis not present

## 2018-05-26 DIAGNOSIS — F3341 Major depressive disorder, recurrent, in partial remission: Secondary | ICD-10-CM

## 2018-05-26 DIAGNOSIS — Z9114 Patient's other noncompliance with medication regimen: Secondary | ICD-10-CM

## 2018-05-26 DIAGNOSIS — E118 Type 2 diabetes mellitus with unspecified complications: Secondary | ICD-10-CM

## 2018-05-26 DIAGNOSIS — I63429 Cerebral infarction due to embolism of unspecified anterior cerebral artery: Secondary | ICD-10-CM | POA: Diagnosis not present

## 2018-05-26 DIAGNOSIS — I8290 Acute embolism and thrombosis of unspecified vein: Secondary | ICD-10-CM | POA: Diagnosis not present

## 2018-05-26 DIAGNOSIS — I4891 Unspecified atrial fibrillation: Secondary | ICD-10-CM | POA: Diagnosis not present

## 2018-05-26 DIAGNOSIS — I10 Essential (primary) hypertension: Secondary | ICD-10-CM

## 2018-05-26 DIAGNOSIS — N3941 Urge incontinence: Secondary | ICD-10-CM

## 2018-05-26 LAB — POCT INR: INR: 2.4 (ref 2.0–3.0)

## 2018-05-26 NOTE — Telephone Encounter (Signed)
Copied from [place] [id number]. Topic: Quick Communication - Rx Refill/Question >> May 26, 2018  2:19 PM Oliver Pila B wrote: Medication: Insulin Glargine (LANTUS) 100 UNIT/ML Solostar Pen [[id number]]   Pt called b/c she is use to taking 30 units and not 15 units; pt wants to make sure 15 units are correct

## 2018-05-26 NOTE — Assessment & Plan Note (Signed)
Doing well overall, denies symptoms of depression. Will slowly wean off of Wellbutrin by starting 1/2 tablet BID x 2 weeks, then 1/2 tablet daily x 2 weeks then stop.  This is an attempt to reduce her symptoms of "brain fog". Also consider moving BP meds to bedtime, except for furosemide.   We will contact her for an update in 2 weeks. Will send message to [facility] services requesting monitoring as we wean her off of Wellbutrin.

## 2018-05-26 NOTE — Assessment & Plan Note (Signed)
Prior non compliance due to confusion regarding regimen and unwillingness to participate in her regimen. Today she seems very knowledgeable regarding her regimen and is compliant to both morning an evening doses.  Appreciate [facility] services in assisting patient with regimen. I personally went through all medications in her bottles and also in pill boxes, she is taking everything as prescribed.  We will be weaning her off of Wellbutrin, discussed this in great detail today. Written instructions also provided.

## 2018-05-26 NOTE — Assessment & Plan Note (Signed)
Compliant to all BP medications and is taking correctly.  BP stable, continue to monitor. Follow up with Cards as scheduled.

## 2018-05-26 NOTE — Assessment & Plan Note (Signed)
Fasting glucose readings are 100-150, she is compliant to her regimen. Following with endocrinology.

## 2018-05-26 NOTE — Patient Instructions (Addendum)
We will start weaning you off of your bupropion SR (Wellbutrin) 150 mg tablets. This may be contributing to your brain fog.  Start by taking 1/2 tablet twice daily for 2 weeks, then 1/2 tablet once daily for 2 weeks then stop. Make sure to go over this with [name] on Wednesday.  Continue all other medications as prescribed.   Remember that your warfarin dose changes depending on your INR levels.   Continue to monitor your blood sugars and blood pressure.  Follow up with the cardiologist as scheduled in December this year.   Please schedule a follow up appointment in 6 months for re-evaluation.   It was a pleasure to see you today!

## 2018-05-26 NOTE — Patient Instructions (Signed)
Please continue dosage of coumadin (warfarin) 1 tablet every day except 1/2 tablet on Mondays & Fridays.  Recheck INR in 4 weeks.

## 2018-05-26 NOTE — Telephone Encounter (Signed)
Spoken to patient and notified her that she will need to continue 30 units per Dr Malissa Hippo.  Patient verbalized understanding.

## 2018-05-26 NOTE — Assessment & Plan Note (Signed)
Doing well overall. Her reported "brain fog" could be residual from CVA, or due to numerous medications. Will work to reduce un-necessary meds first. She seems to be doing very well overall.

## 2018-05-26 NOTE — Assessment & Plan Note (Signed)
Compliant to oxybutynin XL 5 mg once daily, continue same.

## 2018-05-26 NOTE — Progress Notes (Signed)
Subjective:    Patient ID: Megan Obrien, female    DOB: 1946-01-28, 72 y.o.   MRN: [id number]  HPI  [name] is a 72 year old female who presents today for follow up and medication reconciliation. She is followed closely by [facility] services who is assisting with medication organization and adherence.   1) Type 2 Diabetes: Currently managed on Lantus 30 units AM, glimepiride 2 mg. She is no longer on Trulicity due to cost. She is now following with endocrinology.   She is checking her glucose every morning, fasting and is getting readings of low 100's to 150's.   2) CAD/Mitral Valve Stenosis/Chronic Diastolic CHF and PAH/Hypertension: Currently managed on furosemide 40 mg daily, amlodipine 5 mg daily, atorvastatin 40 mg, carvedilol 12.5 mg (1.5 tabs BID), lisinopril 40 mg. She is also on  warfarin 5 mg due to recent CVA.   She has an appointment with her cardiologist in December this year. She gets her warfarin levels checked every 2 weeks on average. She understands that her warfarin dose changes depending on her INR.    BP Readings from Last 3 Encounters:  05/26/18 (!) 146/80  05/01/18 134/82  04/11/18 140/70     3) Urinary Incontinence: Currently managed on oxybutynin XL 5 mg daily. Overall she feels well managed. She denies extreme dry mucous membranes. She is managed on furosemide.   4) Anxiety and Depression: Currently managed on fluoxetine 40 mg and Wellbutrin SR 150 mg BID. Overall she feels well and denies symptoms of depression. She does report feeling like she's in a "fog" most everyday. She takes her Wellbutrin BID and her fluoxetine at night.   Review of Systems  Eyes: Negative for visual disturbance.  Respiratory: Negative for shortness of breath.   Cardiovascular: Negative for chest pain.  Neurological: Negative for dizziness and headaches.  Psychiatric/Behavioral: Negative for suicidal ideas.       Past Medical History:  Diagnosis Date  . (HFpEF) heart  failure with preserved ejection fraction ([place])    a. 03/2017 Echo: EF 55-60%, no rwma, Gr1 DD, mild to mod MS, mod to sev dil LA.  [name] Arthritis   . Carotid arterial disease ([place])    a. 11/2017 CTA Head/Neck: RICA 45, LICA 60.  [name] Cerebrovascular disease    a. 11/2017 MRA: mod to sev stenosis of bilat PCA's P2 segments; b. 11/2017 CTA Head/Neck: RICA 45, LICA 60, sev bilat distal MCA branch stenoses w/ sev bilat P2 stenoses.  . CKD (chronic kidney disease), stage III ([place])   . Depression   . GERD (gastroesophageal reflux disease)   . Hyperlipidemia   . Hypertension   . Left Atrial Appendage Thrombus    a. 11/2017 TEE (performed in setting of stroke/aphasia): nl EF, mod MS, dil LA w/ heavy smoke and small LAA thrombus-->Warfarin initiated.  . Mitral stenosis    a. Mild to moderate by echo 03/2017; b. 03/2017 R/LHC: PCWP 35mmHg, LVEDP 76mmHg. Mean grad of 49mmHg. Valve area of 2.02cm^2; c. 11/2017 TEE: Mod MS.  . Morbid obesity ([place])   . Murmur   . Non-obstructive CAD (coronary artery disease)    a. 03/2017 Cath: mild, non-obstructive CAD.  [name] OSA (obstructive sleep apnea)    a. Previously wore CPAP for several years but stopped on her own and now just uses O2 via [place] @ night.  [name] PAH (pulmonary artery hypertension) ([place])    a. 03/2017 RHC: PA 43mmHg.  . Seasonal allergies   . Stroke [facility])  a. 11/2017 - presented w/ aphasia->MRI acute to early subacute ischemia w/in multiple vascular territories, largest in R frontal & L parietal lobes. Smaller foci of ischemia w/in R hippocampus, R parietal lobe, and L peripheral postcentral gyrus.  . Type 2 diabetes mellitus ([place])   . Urinary incontinence      Social History   Socioeconomic History  . Marital status: Widowed    Spouse name: Not on file  . Number of children: 2  . Years of education: 12 years   . Highest education level: 12th grade  Occupational History  . Occupation: retired   Scientific laboratory technician  . Financial resource strain: Not hard at all  .  Food insecurity:    Worry: Never true    Inability: Never true  . Transportation needs:    Medical: No    Non-medical: No  Tobacco Use  . Smoking status: Never Smoker  . Smokeless tobacco: Never Used  Substance and Sexual Activity  . Alcohol use: No    Alcohol/week: 0.0 standard drinks    Comment: rarely  . Drug use: No  . Sexual activity: Never  Lifestyle  . Physical activity:    Days per week: 1 day    Minutes per session: 40 min  . Stress: Not at all  Relationships  . Social connections:    Talks on phone: Three times a week    Gets together: Twice a week    Attends religious service: Never    Active member of club or organization: No    Attends meetings of clubs or organizations: Never    Relationship status: Widowed  . Intimate partner violence:    Fear of current or ex partner: Patient refused    Emotionally abused: Patient refused    Physically abused: Patient refused    Forced sexual activity: Patient refused  Other Topics Concern  . Not on file  Social History Narrative   Widow.   2 children, numerous grandchildren.   Retired. Once worked on a Publishing copy.   Enjoys playing computer games, spending time with family.    Past Surgical History:  Procedure Laterality Date  . BACK SURGERY    . CARDIAC CATHETERIZATION    . KNEE SURGERY Left   . RIGHT/LEFT HEART CATH AND CORONARY ANGIOGRAPHY Bilateral 04/25/2017   Procedure: Right/Left Heart Cath and Coronary Angiography;  Surgeon: Wellington Hampshire, MD;  Location: [facility];  Service: Cardiovascular;  Laterality: Bilateral;  . TEE WITHOUT CARDIOVERSION N/A 12/20/2017   Procedure: TRANSESOPHAGEAL ECHOCARDIOGRAM (TEE);  Surgeon: Wellington Hampshire, MD;  Location: [facility];  Service: Cardiovascular;  Laterality: N/A;  . TONSILLECTOMY AND ADENOIDECTOMY  1951    Family History  Problem Relation Age of Onset  . Lung cancer Father   . Alzheimer's disease Mother   . Stroke Maternal Grandfather     No  Known Allergies  Current Outpatient Medications on File Prior to Visit  Medication Sig Dispense Refill  . ACCU-CHEK AVIVA PLUS test strip As directed    . acetaminophen (TYLENOL) 650 MG CR tablet Take 500 mg by mouth every 8 (eight) hours as needed for pain.     [name] amLODipine (NORVASC) 5 MG tablet Take 1 tablet by mouth daily for blood pressure. 30 tablet 3  . atorvastatin (LIPITOR) 40 MG tablet Take 1 tablet (40 mg total) by mouth at bedtime. 90 tablet 1  . buPROPion (WELLBUTRIN SR) 150 MG 12 hr tablet TAKE 1 TABLET BY MOUTH TWICE DAILY 180  tablet 1  . carvedilol (COREG) 12.5 MG tablet Take 18.75 mg by mouth 2 (two) times daily.    . ferrous sulfate 325 (65 FE) MG tablet Take 325 mg by mouth daily.    [name] FLUoxetine (PROZAC) 40 MG capsule Take 1 capsule by mouth once daily for depression and anxiety. 90 capsule 1  . fluticasone (FLONASE) 50 MCG/ACT nasal spray Place 1 spray into both nostrils daily. 48 g 1  . furosemide (LASIX) 40 MG tablet TAKE 1 TABLET BY MOUTH ONCE DAILY 90 tablet 2  . glimepiride (AMARYL) 2 MG tablet Take 2 mg by mouth daily with breakfast.    . Insulin Glargine (LANTUS) 100 UNIT/ML Solostar Pen Inject 15 Units into the skin every evening. 15 mL 1  . Insulin Pen Needle 32G X 6 MM MISC Use as instructed to inject Lantus daily. Dx is E11.8 100 each 2  . lisinopril (PRINIVIL,ZESTRIL) 40 MG tablet Take 40 mg by mouth daily.    . Melatonin 10 MG TABS Take 10 mg by mouth at bedtime.    [name] omega-3 acid ethyl esters (LOVAZA) 1 g capsule Take 1 capsule by mouth daily with food for cholesterol. 90 capsule 1  . omeprazole (PRILOSEC) 40 MG capsule Take 40 mg by mouth daily.    [name] oxybutynin (DITROPAN-XL) 5 MG 24 hr tablet Take 1 tablet by mouth at bedtime for urinary incontinence. 90 tablet 1  . triamcinolone cream (KENALOG) 0.1 % Apply 1 application topically 2 (two) times daily. 30 g 0  . warfarin (COUMADIN) 5 MG tablet  TAKE ONE TABLET BY MOUTH AS DIRECTED BY COUMADIN CLINIC 30 tablet  0   No current facility-administered medications on file prior to visit.     BP (!) 146/80   Pulse 62   Temp 97.8 F (36.6 C) (Oral)   Ht 5' 6.5" (1.689 m)   Wt 267 lb 12 oz (121.5 kg)   SpO2 97%   BMI 42.57 kg/m    Objective:   Physical Exam  Constitutional: She is oriented to person, place, and time. She appears well-nourished.  Cardiovascular: Normal rate and regular rhythm.  Murmur heard. Respiratory: Effort normal and breath sounds normal. She has no wheezes.  Neurological: She is alert and oriented to person, place, and time.  Skin: Skin is warm and dry.  Psychiatric: She has a normal mood and affect.           Assessment & Plan:

## 2018-05-28 ENCOUNTER — Other Ambulatory Visit: Payer: Self-pay | Admitting: Cardiovascular Disease

## 2018-05-28 ENCOUNTER — Encounter: Payer: Self-pay | Admitting: *Deleted

## 2018-05-28 ENCOUNTER — Other Ambulatory Visit: Payer: Self-pay | Admitting: *Deleted

## 2018-05-28 ENCOUNTER — Telehealth: Payer: Self-pay

## 2018-05-28 ENCOUNTER — Other Ambulatory Visit: Payer: Self-pay | Admitting: Physician Assistant

## 2018-05-28 ENCOUNTER — Ambulatory Visit: Payer: Medicare Other | Admitting: Pharmacist

## 2018-05-28 MED ORDER — CARVEDILOL 12.5 MG PO TABS: 18.7500 mg | ORAL_TABLET | Freq: Two times a day (BID) | ORAL | 2 refills | Status: DC

## 2018-05-28 NOTE — Patient Outreach (Signed)
[facility]) Care Management  [place] CM Routine Home Visit Care Coordination Outreach x 2 05/28/2018  Megan Obrien 04/16/1946 [id number]  Megan Obrien is an 72 y.o. female referred to Megan Obrien after recent hospitalization March 19-25, 2020fr CVA with expressive aphasia; [facility].Patient has history including, but not limited to, DM; HTN/ HLD; GERD; mitral valve disease. HIPAA/ identity verified with patient in person today; patient is alone in her home for home visit.  Pleasant 120 minute home visit.  Today, Megan Obrien that sheis "doingreal good," and she denies pain today.  Reports recent fall about "2 weeks ago," where she is "not sure what happened-- just lost my balance or got tangled up in my feet."  Reports that she was wearing her life alert and called EMS to assist in getting her up after falling.  States that did not have injuries with fall; did not go to Obrien , stating, "there was no need to."  Fall risk/ prevention education was reiterated with patient; patient continues using rolling walker 100% of the time with ambulation, and her gait today with witnessed ambulation at home is slow, steady, purposeful with use of walker.  Patient remains high fall risk.  Patient reports that she is rarely bothered by chronic knee pain, "until she starts walking," and patient is noticeably guarded with ambulation, due to chronic knee pain.  Patient reports that she has been "getting up earlier" and when I arrived at her home today promptly at 11:45, she has checked her blood sugar and eaten breakfast, but has not yet taken her morning medications.  Patient reports that her previously reported "feeling of brain fog" seems "a little better lately."  Patient is in no obvious distress throughout home visit today.  Patient reports that her private duty caregiver  continues visiting her each week to assist with care needs or to transport her to provider appointments.  Home health services have now been completed.  Subjective: "I've really been paying extra close attention to filling my pill box.... I don't know why I keep making mistakes; I am going to start with the carvedilol the next time I fill it for the week.  I'm just glad my suars have been coming down, and that I am feeling so much better."  Assessment:  Megan Obrien continued taking her insulin appropriately for several weeks now, and her blood sugar readings at home have greatly improved.  Although self- management of her insulin dosing and administration is now under control, she continues to have opportunities around filling her pill box correctly-- although her pill box is much more accurately filled than it was one month ago, there continues to be several concerning discrepancies which were discovered today around oral medications and pill planner box (see medication narrative below for details).  Megan Obrien that she has been paying extra attention to filling her pill box, and today, we developed a strategy for her to try with the next weekly filling of her pill box to help minimize future discrepancies around pill box filling.  CVernettecontinues to decline Obrien of blister packaging with her medications, and states that she wants to independently fill her pill box.       Patient further reports:  Medications: -- patient Obrien a very good general understanding of the purpose, dosing, and scheduling of her prescribed medications; however-- when I reviewed with her specific instructions for  weaning bupropion, as per PCP office visit 2 days ago on 05/26/18, patient stated that she "did not remember being told that;"  Patient then produced an AVS summary with her medications on it-- however, this AVS summary was from a cardiology office visit in July 2019; with some looking around her home,  patient was eventually able to locate her printed AVS from PCP office visit 2 days ago- in her pocketbook.  We reviewed specific (new) instructions for bupropion weaning, and I wrote her a large bold print memo with specific instructions and specific dates she is to make the prescribed changes for weaning this medication and placed on her refrigerator for ongoing reference; additionally, I wrote these dates on her personal calendar as well; by the end of today's home visit, patient is able to re-verbalize new instructions for weaning this medication. -- although she is able to verbalize a good general understanding of her medications, as above, at time of medication review, the following discrepancies were discovered in review of her filled pill box: ---- today's morning slot did not have amlodipine, nor glimepiride: these medications were added to today's slot, and patient then took today's morning dose of her medications, at approximately 12:45 pm ---- 4 morning slots did not have any carvedilol in them-- all evening slots were appropriately filled: to further complicate this issue, patient did not have enough carvedilol in her prescription bottle to complete this week's pill box slots; her pill box was corrected, through Friday of this week.  Care Coordination Outreach x 2: 1:00 pm:  Contacted patient's pharmacy Megan Obrien); facilitated prescription re-order, although pharmacy tech Megan Obrien shared that they would need authorization from patient's cardiologist before this medication can be filled; explained to pharmacist that patient will not have any of this medication after Friday-- confirmed that pharmacist has sent fax to patient's cardiologist 1:05 pm: Megan Obrien cardiology Megan Obrien) and explained above situation to [place]; requested stat/ prompt re-fill of this medication; confirmed that Megan Obrien will place this request promptly with cardiologist  I then wrote an additional bold  print memo for patient to post on her refrigerator to remind her to pick up her medications on Friday when her caregiver arrives, and to add the necessary carvedilol to her current pill box on Saturday and Sunday morning slots (all other- evening- slots were appropriately filled in pill box).  By the end of today's home visit, patient is able to verbalize need to add this medication to Saturday and Sunday morning slots only.  Patient is unable to determine why carvedilol is a challenge for her to correctly fill in pill box-- her best estimation of this consistent discrepancy is that this medication has to be cut in half, and it is a small white pill, as are several of her medications; together, we developed a strategy for patient to begin weekly pill box filling with carvedilol, so that it is the first medication she handles with pill box filling, to minimize future discrepancies.  Despite the above noted discrepancies, patient has shown improvement in her filling her pill box from last [place] home visit-- positive reinforcement was provided, along with encouragement.  To note, patient is very astute around her coumadin dosing, and is able to independently verbalize coumadin dosing instructions, without prompting or without referring to written instructions.  Self-health management of chronic disease state of DM: -- patient has continued consistently monitoring and recording daily fasting blood sugars: states she has held off monitoring and recording throughout the day,  as her test strips are coming to need to be re-filled and can not be refilled for "about 2 weeks;" patient states that she only has enough test strips to complete morning fasting blood sugars readings -- fasting blood sugars were reviewed over last 3 weeks: blood sugars have consistently ranged from 94-160, with a reading today of 136: positive reinforcement provided for this excellent improvement in her blood sugars -- patient remains  confused around significance/ value of A1-Megan levels; we discussed that during next [place] home visit, we would more thoroughly cover this, if her pill box filling/ medication management challenges have shown improvement; for now, I reiterated previously provided education around difference between long and short acting insulins; need to continue monitoring and recording blood sugars, and need to continue following DM prudent diet      Patient denies further issues, concerns, or problems today.  I confirmed that patient has my direct phone number, the main [facility] CM office phone number, and the [facility] CM 24-hour nurse advice phone number should issues arise prior to next scheduled [place] outreach by phone next week, with routine home visit in 3 weeks.  Discussed with patient that in the coming weeks, if indicated around her progress with medication management, I may place referral to [place] for further education around self-health management of DM; patient is agreeable to this. Encouraged patient to contact me directly if needs, questions, issues, or concerns arise prior to next scheduled outreach; patient agreed to do so.  Objective:    BP (!) 164/70   Pulse 60   Resp 18   SpO2 98%   Review of Systems  Constitutional: Negative.   Respiratory: Negative.  Negative for cough, shortness of breath and wheezing.   Cardiovascular: Negative.  Negative for chest pain, palpitations and leg swelling.  Gastrointestinal: Negative.  Negative for abdominal pain and nausea.  Genitourinary: Positive for frequency and urgency.       Diuretic therapy  Musculoskeletal: Positive for falls and joint pain.       Ongoing chronic knee pain; reports new fall "about 2 weeks ago;" states she "just lost balance," reports no injury with fall- had to call EMS using life alert system to get assistance with getting up; fall risks/ prevention education reiterated today  Neurological: Negative.   Negative for dizziness.  Psychiatric/Behavioral: Negative.  Negative for depression. The patient is not nervous/anxious.    Physical Exam  Constitutional: She is oriented to person, place, and time. She appears well-developed and well-nourished. No distress.  Cardiovascular: Normal rate.  Respiratory: Effort normal and breath sounds normal. No respiratory distress.  Musculoskeletal: She exhibits no edema.  Neurological: She is alert and oriented to person, place, and time.  Skin: Skin is warm and dry. No erythema.  Psychiatric: She has a normal mood and affect. Her behavior is normal. Judgment and thought content normal.   Encounter Medications:   Outpatient Encounter Medications as of 05/28/2018  Medication Sig Note  . acetaminophen (TYLENOL) 650 MG CR tablet Take 500 mg by mouth every 8 (eight) hours as needed for pain.    [name] amLODipine (NORVASC) 5 MG tablet Take 1 tablet by mouth daily for blood pressure.   [name] atorvastatin (LIPITOR) 40 MG tablet Take 1 tablet (40 mg total) by mouth at bedtime. 04/08/2018: Switch to AM dosing  . buPROPion (WELLBUTRIN SR) 150 MG 12 hr tablet TAKE 1 TABLET BY MOUTH TWICE DAILY 05/28/2018: Patient taking 1/2 tablet BID  x 2 weeks, per new instructions from PCP on 05/26/18; after 2 weeks following these instructions, patient is to take 1/2 tablet QD x 2 weeks, and then discontinue  . carvedilol (COREG) 12.5 MG tablet Take 18.75 mg by mouth 2 (two) times daily. 04/01/2018: Patient reports that she has difficulty remembering to take pm dose-- states she goes to bed so early, she often forgets; pill box  review indicates missed does for both Sunday and Monday nights of this week  . ferrous sulfate 325 (65 FE) MG tablet Take 325 mg by mouth daily. 05/01/2018: Reports is currently out of this medication  . FLUoxetine (PROZAC) 40 MG capsule Take 1 capsule by mouth once daily for depression and anxiety.   . furosemide (LASIX) 40 MG tablet TAKE 1 TABLET BY MOUTH ONCE DAILY   .  glimepiride (AMARYL) 2 MG tablet Take 2 mg by mouth daily with breakfast. 05/14/2018: Patient reports this was started 05/05/18 during endocrinology office visit, when trulicity was discontinued  . Insulin Glargine (LANTUS) 100 UNIT/ML Solostar Pen Inject 15 Units into the skin every evening. (Patient taking differently: Inject 30 Units into the skin daily. ) 04/15/2018: Patient reports this dose was increased to 30 U q am on 04/15/18 by Dr. Pasty Arch-- patient now taking q am (not qHS)  . lisinopril (PRINIVIL,ZESTRIL) 40 MG tablet Take 40 mg by mouth daily.   [name] omega-3 acid ethyl esters (LOVAZA) 1 g capsule Take 1 capsule by mouth daily with food for cholesterol.   [name] omeprazole (PRILOSEC) 40 MG capsule Take 40 mg by mouth daily.   [name] oxybutynin (DITROPAN-XL) 5 MG 24 hr tablet Take 1 tablet by mouth at bedtime for urinary incontinence.   . triamcinolone cream (KENALOG) 0.1 % Apply 1 application topically 2 (two) times daily. 01/28/2018: Per pt as needed.   . [DISCONTINUED] warfarin (COUMADIN) 5 MG tablet  TAKE ONE TABLET BY MOUTH AS DIRECTED BY COUMADIN CLINIC   . ACCU-CHEK AVIVA PLUS test strip As directed   . fluticasone (FLONASE) 50 MCG/ACT nasal spray Place 1 spray into both nostrils daily. (Patient not taking: Reported on 05/28/2018) 05/28/2018: Patient reports on 05/28/18 is no longer taking   . Insulin Pen Needle 32G X 6 MM MISC Use as instructed to inject Lantus daily. Dx is E11.8   . Melatonin 10 MG TABS Take 10 mg by mouth at bedtime. 05/28/2018: Patient reports on 05/28/18 she no longer takes   No facility-administered encounter medications on file as of 05/28/2018.    Functional Status:   In your present state of health, do you have any difficulty performing the following activities: 01/28/2018 12/18/2017  Hearing? Y -  Comment left ear  -  Vision? N -  Difficulty concentrating or making decisions? N -  Walking or climbing stairs? Y -  Dressing or bathing? N -  Doing errands, shopping? [name]   Preparing Food and eating ? Y -  Using the Toilet? N -  In the past six months, have you accidently leaked urine? Y -  Do you have problems with loss of bowel control? N -  Managing your Medications? N -  Managing your Finances? Y -  Housekeeping or managing your Housekeeping? Y -  Some recent data might be hidden   Fall/Depression Screening:    Fall Risk  05/28/2018 05/01/2018 04/08/2018  Falls in the past year? (No Data) (No Data) (No Data)  Comment Reports new fall "about 2 weeks ago;" reports "lost balance" without further explanation;  denies injury after fall; states EMS came and assisted getting her up Denies new/ recent falls denies new/ recent falls  Number falls in past yr: 2 or more - -  Comment - - -  Injury with Fall? No - -  Risk Factor Category  High Fall Risk - -  Risk for fall due to : History of fall(s);Impaired balance/gait;Medication side effect Impaired balance/gait;Medication side effect -  Risk for fall due to: Comment - previously provided fall risks/ prevention education reiterated with patient today -  Follow up Falls evaluation completed;Falls prevention discussed;Education provided - -   PHQ 2/9 Scores 05/28/2018 01/20/2018 01/15/2017 09/11/2016 07/24/2016  PHQ - 2 Score 0 0 4 0 0  PHQ- 9 Score - - 13 - -   Plan:  Patient will take medications as prescribed and will attend all scheduled provider appointments  Patient will continue monitoring and recording fasting blood sugars every morning, or more as indicated  Patient will promptly notify care providers for any new concerns/ issues/ or problems that arise  Patient will continue using assistive devices for fall prevention  I will share today's [facility] CCM home visit notes and care plan with patient's PCP  [place] [facility] CM outreachto continue withscheduled phone callnext week and routine home visit, possibly for transfer to [place], next month  [facility] CM Care Plan Problem One     Most Recent  Value  Care Plan Problem One  Medication mismanagement as evidenced by patient reporting/ demonstration and review of medications through EMR  Role Documenting the Problem One  Care Management [place] for Problem One  Active  [facility] Long Term Goal   Over the next 22 days, patient will verbalize appropriate and safe medication management strategies at home, as evidenced by patient reporting and demonstration/ review of patient's medication management system at home during [place] outreach [Goal extended today additional 22 days]  [facility] Long Term Goal Start Date  05/28/18 [Goal extended today]  Interventions for Problem One Long Term Goal  Completed in home medication review of patient's pill box/ prescription bottles and again discovered several discrepancies although improvement is noted,  corrected patient's pill box and placed care coordination phone calls to patient's pharmacy and cardiology provider to ensure that patient has necessary quantity of carvedilol, as patient was lacking 2 days for current pill box,  wrote bold large print memo for patient and placed on her refrigerator around new instructions for medications after PCP office visit,  updated patient's personal calendar around dates she is to make changes in medication for buproprion weaning  [facility] CM Short Term Goal #1   Over the next 14 days, patient will attend PCP office visit as scheduled for medication review, as evidenced by patient reporting and review of EMR/ collaboration with PCP as indicated during [facility] RN CCM outreach  [facility] CM Short Term Goal #1 Start Date  05/14/18  [facility] CM Short Term Goal #1 Met Date  05/28/18 [Goal Met]  Interventions for Short Term Goal #1  Confirmed that patient attended recent PCP office visit and reviewed all notes and post-visit AVS with patient,  reiterated all instructions around medications that were provided at PCP office visit  [facility] CM Short Term Goal #2   Over the next 22 days, patient will  begin filling her weekly pill box with carvedilol first, and then will proceed with other medications, as evidenced by patient reporting and visual inspection of pill box during [facility] [place] RN  CCM outreach  [facility] CM Short Term Goal #2 Start Date  05/28/18  Interventions for Short Term Goal #2  Discussed patient's barriers around carvedilol discrepancies noted in pill box,  encouraged patient to begin starting process for pill box filling with carvedilol first to minimize discrepancies in the future,  provided positive reinforcement for the progress patient has shown since last [place] home visit in accurately filling pill box    [facility] CM Care Plan Problem Two     Most Recent Value  Care Plan Problem Two  Self-health management deficit for chronic disease state of DM, as evidenced by patient reporting and ongoing elevated blood sugars   Role Documenting the Problem Two  Care Management Coordinator  Care Plan for Problem Two  Active  Interventions for Problem Two Long Term Goal   Reiterated previously provided education around safe use of insulin,  discussed in person using teach back method differences between long-acting and short-acting insulin,  provided positive reinforcement for progress patient has made in lowering blood sugars, and encouraged patient to continue monitoring and recording blood sugars consistently   [facility]N Long Term Goal  Over the next 22 days, patient will be able to verbalize 3 self-health management strategies for management of chronic disease state of DM, as evidenced by patient reporting during [place] outreach [Goal extended today additional 22 days]  [facility] Long Term Goal Start Date  05/28/18 [Goal extended today]     Oneta Rack, RN, BSN, [place] Coordinator [facility] Care Management  [phone]

## 2018-05-28 NOTE — Telephone Encounter (Signed)
Refill Request.  

## 2018-05-28 NOTE — Telephone Encounter (Signed)
Requested Prescriptions   Signed Prescriptions Disp Refills  . carvedilol (COREG) 12.5 MG tablet 90 tablet 2    Sig: Take 1.5 tablets (18.75 mg total) by mouth 2 (two) times daily.    Authorizing Provider: Rise Mu    Ordering User: Britt Bottom

## 2018-05-28 NOTE — Telephone Encounter (Signed)
Please advise if ok to refill. 

## 2018-05-28 NOTE — Telephone Encounter (Signed)
*  STAT* If patient is at the pharmacy, call can be transferred to refill team.   1. Which medications need to be refilled? (please list name of each medication and dose if known) Carvedilol  2. Which pharmacy/location (including street and city if local pharmacy) is medication to be sent to? [place]  3. Do they need a 30 day or 90 day supply? [place]

## 2018-05-29 ENCOUNTER — Other Ambulatory Visit: Payer: Self-pay

## 2018-05-29 ENCOUNTER — Telehealth: Payer: Self-pay

## 2018-05-29 MED ORDER — CARVEDILOL 12.5 MG PO TABS: 18.7500 mg | ORAL_TABLET | Freq: Two times a day (BID) | ORAL | 0 refills | Status: DC

## 2018-05-29 NOTE — Telephone Encounter (Signed)
Megan Obrien CMA said Dr Pasty Arch Endo at [facility] would be doing pts A1C and for pt to contact Dr Ruthy Dick office. Pt voiced understanding and will contact Dr Ruthy Dick office.nothing further needed.

## 2018-05-29 NOTE — Telephone Encounter (Signed)
Copied from [place] [id number]. Topic: General - Other >> May 29, 2018  4:04 PM Carolyn Stare wrote:  Pt received a recording telling her she need A1c check  she was was just in on 05/26/18 .Does she need this done if so contact pt

## 2018-06-06 ENCOUNTER — Encounter: Payer: Self-pay | Admitting: *Deleted

## 2018-06-06 ENCOUNTER — Other Ambulatory Visit: Payer: Self-pay | Admitting: *Deleted

## 2018-06-06 NOTE — Patient Outreach (Signed)
[facility]) Care Management [place] Telephone Outreach Unsuccessful telephone outreach attempt #1  06/06/2018  Megan Obrien [birth year] [id number]  11:15 am/ 1:00 pm/ 3:20 pm: Unsuccessful telephone outreach x 3 to Megan Obrien is an 72 y.o. female referred to [place] after recent hospitalization March 19-25, 2070for CVA with expressive aphasia; patient was discharged to SNF for rehabilitation and was discharged from [facility] on January 14, 2018.Patient has history including, but not limited to, DM; HTN/ HLD; GERD; mitral valve disease.    With each call attempt today, patient's phone rang without physical or voice mail pick up; unable to leave patient voice mail message requesting call back.   Plan:  Will request that covering [place] place telephone outreach for care coordination to patient next week if I do not hear from patient before then.  Oneta Rack, RN, BSN, [employer] [facility] Care Management  [phone]

## 2018-06-09 DIAGNOSIS — E1169 Type 2 diabetes mellitus with other specified complication: Secondary | ICD-10-CM | POA: Diagnosis not present

## 2018-06-09 DIAGNOSIS — E1165 Type 2 diabetes mellitus with hyperglycemia: Secondary | ICD-10-CM | POA: Diagnosis not present

## 2018-06-09 DIAGNOSIS — I129 Hypertensive chronic kidney disease with stage 1 through stage 4 chronic kidney disease, or unspecified chronic kidney disease: Secondary | ICD-10-CM | POA: Diagnosis not present

## 2018-06-09 DIAGNOSIS — E1122 Type 2 diabetes mellitus with diabetic chronic kidney disease: Secondary | ICD-10-CM | POA: Diagnosis not present

## 2018-06-12 ENCOUNTER — Other Ambulatory Visit: Payer: Self-pay

## 2018-06-12 NOTE — Patient Outreach (Signed)
[facility]) Care Management  06/12/2018  MARCI POLITO 25-May-1946 [id number]  Care Coordination: Successful telephone encounter to the above patient for care coordination. RN CM covering for primary [facility] RN CM [name] and following up on primary RN CM's inability to contact patient on Friday 06/06/18. Patient states "I am doing real good". She admits to sleeping in this morning. She has had her breakfast however as of 12:21pm she has not checked her blood sugar. Patient offered to check CBG while on the phone with RN CM. 216. Patient unsure how long it has been since she has eaten. "Its been at least an hour, maybe longer."  Patient continues to fill her pill box and take medications as prescribed. She is currently out of her omeprazole however she will pick up medication tomorrow.  Patient acknowledges a fall without injury 2 days ago. States she was walking to her car (at home) and "lost my balance" while stepping over the curbing, trying to get into the car. She admits to not having her hands on her walker at the time of fall. "Had some men help me get up and I went on about my shopping as planned".  RN CM reminded patient of primary RN CM home visit scheduled for 06/18/18 between 11-11:30. Patient states "I have it on my calendar". Briefly discussed importance of checking CBG prior to eating, medication adherence, and utilization of walker at all times. RN CM confirmed patient has 24 hour nurse advice line and main [place] office number.  Plan: Will update primary RN CM upon her return to office.  [place] E. Rollene Rotunda RN, BSN [facility] Care Management Coordinator [phone]

## 2018-06-15 DIAGNOSIS — G4730 Sleep apnea, unspecified: Secondary | ICD-10-CM | POA: Diagnosis not present

## 2018-06-18 ENCOUNTER — Other Ambulatory Visit: Payer: Self-pay | Admitting: *Deleted

## 2018-06-18 ENCOUNTER — Encounter: Payer: Self-pay | Admitting: *Deleted

## 2018-06-18 NOTE — Patient Outreach (Signed)
Megan Obrien) Care Management  [place] Routine Home Visit Transfer to [facility] 06/18/2018  Megan Obrien 1946/02/05 [id number]  Megan Obrien is an 72 y.o. 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 HIPAA/ identity verified with patient in person today; patient is alone in her home for home visit.  Pleasant 90 minute home visit.  Subjective:  "I think I am doing better with my medicines; but I am pretty much eating whatever I want; I don't really follow a diabetic diet."  Assessment:  Megan Obrien continued taking her insulin appropriately for several weeks now, and her blood sugar readings at home have greatly improved.  Although self- management of her insulin dosing and administration is now under control, she continues to have ongoing elevated A1-C levels, and admits that she does not follow a diabetic diet.   Patient has made progress around accurately filling her weekly pill box, but could continue to improve in her medication adherence/ pill box filling.  Megan Obrien again declines option of compliance packaging of her medications, and assures me that she will ask her daughter to assist/ supervise with weekly pill box filling.  Megan Obrien admits to non-adherence to diabetic diet, and is interested in ongoing education around strategies for dietary control of DM.  Patient remains a high fall risk.  Today, Megan Obrien that sheis "doingjust fine," and she reports ongoing/chronic back pain today, "mostly on (R) side."  Reports another fall about "2 weeks ago," which makes 2 falls without injury over last 6 weeks; with most recent fall, she reports that she was stepping down from curbing to get  in her caregivers vehicle; reports that since then, she and her caregiver have not used the curb side of the parking lot, so she no longer has to navigate curbing at all.  Reports that her ongoing chronic knee pain also makes her prone to falling, stating "it just gives out on me every so often without warning."  Denies injury with recent falls, and previously provided fall risk/ prevention education was re-iterated with patient today.  Patient continues using rolling walker all of the time and also has a life-alert system/ necklace which she wears at all times.  As with previous home visits, there are no obvious fall risks/ hazards in patient's home environment; however, patient does have a pet cat.  Patient reports that she believes she is "more alert with less brain fog" now that she has been weaning off of bupropion, as directed by PCP during last office visit.  Patient is in no obvious distress throughout home visit today. Patient reports that her private duty caregiver continues visiting her each week on Monday and Friday to assist with care needs or to transport her to provider appointments/ errands.  Patient further reports:  Medications: -- patient verbalizes a very good general understanding of the purpose, dosing, and scheduling of her prescribed medications; medication adherence has been a primary focus of Megan Obrien intervention; patient has frequently reported that she "just forgets" to take her evening medications before going to bed, and she has been unable to consistently remember since [facility] has been involved in her care.  Patient continues to decline option of compliance packaging of her medications- states that  she wishes to continue managing her own medications.  Fewer medication discrepancies were discovered today than last 2 Megan Obrien home visits, but discrepancies continue to revolve around: ---- patient missing/ not taking evening doses of medications--  ongoing issue ---- occasionally missed doses of carvedilol in pill box-- ongoing issue  When I discussed with patient that while she has shown improvement in her medication management, she continues occasionally missing evening doses of her medications and does not fill her pill box correctly with carvedilol, she again states that she goes to bed early on some nights and "just doesn't remember" and that she did not consistently follow the plan we developed at time of last [facility] home visit, to begin her weekly pill box filling with the carvedilol first.  Patient reports that she "just forgot sometimes."  I again discussed with patient the importance of taking all of her medications exactly as prescribed, which includes not missing doses.  I encouraged her to consider compliance packaging yet again-- but patient continues to decline this option, stating she does not wish to use.  We had previously discussed patient asking her daughter to supervise/ assist with weekly pill box filling, which patient did not do.  I again encouraged this, and patient stated today that she would get her daughter to assist/ supervise.   Patient continues to be able to accurately verbalize insulin dosing and reports compliance with taking.  Patient noted on be on her last week of bupropion weaning; using teach back method, confirmed that patient understands that this medication is to be completely stopped on Wednesday 06/25/18; additionally, I wrote this on a large memo which I placed on her refrigerator and on the actual prescription bottle.    All noted discrepancies in patient's weekly pill box was corrected/ pillbox was correctly updated for the next 7 days.  Self-health management of chronic disease state of DM: -- patient has continued consistently monitoring and recording daily fasting blood sugars each morning: states she has continued only monitoring and recording once daily based on instructions after last  endocrinology office visit.  Encouraged patient to continue monitoring/ recording blood sugars at least once daily.  Reviewed with patient recent blood sugars recorded at home: general range 113-214 with fasting blood sugar today of 151. -- patient attended recent endocrinology office visit: verbalizes commitment to attending endocrinology and PCP appointments -- as stated above, patient is able to accurately verbalize dosing for insulin and oral DM medications -- As we reviewed notes from endocrinology office visit June 09, 2018, patient shared that she will be going for a re-check of her last A1-C "next week."  I provided printed EMMI educational material and reviewed with patient significance/ role of A1-C values over time/ trending, using teach back method. We reviewed all patient's A1-C values over last year, and discussed normal/ goal values, along with specific blood sugar ranges as correlated with A1-C values.  This was written down for patient for her ongoing review at home. -- patient admits today that she "doesn't really eat a diabetic diet," adding that she "pretty much eats whatever" she "wants to."  I explained to patient that medication alone is not sufficient in controlling DM long-term, and we discussed the importance of diet in self-health management of DM.  Patient reports that she has lost the "Living well With Diabetes" booklet that I previously provided for her-- another copy was provided, along with printed EMMI educational material around diabetic meal planning.  Patient reports that  she has not reviewed the previously provided educational material, and I encouraged her to keep the material I provided to her today and to review it regularly: specifically, foods to avoid and food that are good to eat in setting of DM.      -- we discussed need for ongoing education around self-health management of chronic disease state of DM, and I reiterated with patient role of [place],  as patient no longer verbalizes care coordination needs, and has essentially met her previously established [place] CM goals; patient agrees to my placing referral for [place], and I encouraged her to actively engage with Health Coach once outreach is established.  Patient denies further issues, concerns, or problems today. I confirmed that patient hasmy direct phone number, the main [place] office phone number, and the [facility] CM 24-hour nurse advice phone number should issues arise prior to [place] outreach.  Objective:    BP (!) 152/82   Pulse (!) 58   Resp 18   SpO2 98%   Review of Systems  Constitutional: Negative.   Respiratory: Negative.  Negative for cough, shortness of breath and wheezing.   Cardiovascular: Negative.  Negative for chest pain and leg swelling.       Patient reports that she believes her feet are slightly swollen bilaterally; unable to discern any change from last [facility] home visit  Gastrointestinal: Negative.  Negative for abdominal pain and nausea.  Genitourinary: Positive for frequency and urgency.       Diuretic therapy  Musculoskeletal: Positive for back pain, falls and myalgias.       Patient has chronic knee and back pain; patient continues having intermittent falls without injury, despite using walker at all times; fall risks/ prevention education reiterated today  Neurological: Positive for dizziness.       Reports ongoing intermittent dizziness; denies today   Psychiatric/Behavioral: Negative for depression. The patient is not nervous/anxious.    Physical Exam  Constitutional: She is oriented to person, place, and time. She appears well-developed and well-nourished. No distress.  obese  Cardiovascular: Regular rhythm. Bradycardia present.  Murmur heard. Respiratory: Effort normal and breath sounds normal. No respiratory distress. She has no wheezes. She has no rales.  GI: Soft.  Musculoskeletal: She exhibits no edema.   Neurological: She is alert and oriented to person, place, and time.  Skin: Skin is warm and dry. No erythema.  Psychiatric: She has a normal mood and affect. Her behavior is normal. Thought content normal.   Encounter Medications:   Outpatient Encounter Medications as of 06/18/2018  Medication Sig Note  . acetaminophen (TYLENOL) 650 MG CR tablet Take 500 mg by mouth every 8 (eight) hours as needed for pain.    [name] amLODipine (NORVASC) 5 MG tablet Take 1 tablet by mouth daily for blood pressure.   [name] atorvastatin (LIPITOR) 40 MG tablet Take 1 tablet (40 mg total) by mouth at bedtime. 04/08/2018: Switch to AM dosing  . buPROPion (WELLBUTRIN SR) 150 MG 12 hr tablet TAKE 1 TABLET BY MOUTH TWICE DAILY 05/28/2018: Patient taking 1/2 tablet BID x 2 weeks, per new instructions from PCP on 05/26/18; after 2 weeks following these instructions, patient is to take 1/2 tablet QD x 2 weeks, and then discontinue  . carvedilol (COREG) 12.5 MG tablet Take 1.5 tablets (18.75 mg total) by mouth 2 (two) times daily.   . ferrous sulfate 325 (65 FE) MG tablet Take 325 mg by mouth daily.  05/01/2018: Reports is currently out of this medication  . FLUoxetine (PROZAC) 40 MG capsule Take 1 capsule by mouth once daily for depression and anxiety.   . furosemide (LASIX) 40 MG tablet TAKE 1 TABLET BY MOUTH ONCE DAILY   . glimepiride (AMARYL) 2 MG tablet Take 2 mg by mouth daily with breakfast. 05/14/2018: Patient reports this was started 05/05/18 during endocrinology office visit, when trulicity was discontinued  . Insulin Glargine (LANTUS) 100 UNIT/ML Solostar Pen Inject 15 Units into the skin every evening. (Patient taking differently: Inject 30 Units into the skin daily. ) 06/18/2018: Continues taking 30 U QD in morning per patient report   . lisinopril (PRINIVIL,ZESTRIL) 40 MG tablet TAKE 1 TABLET BY MOUTH ONCE DAILY 06/18/2018: Taking per patient report   . omega-3 acid ethyl esters (LOVAZA) 1 g capsule Take 1 capsule by mouth daily  with food for cholesterol.   [name] omeprazole (PRILOSEC) 40 MG capsule Take 40 mg by mouth daily.   [name] oxybutynin (DITROPAN-XL) 5 MG 24 hr tablet Take 1 tablet by mouth at bedtime for urinary incontinence.   . triamcinolone cream (KENALOG) 0.1 % Apply 1 application topically 2 (two) times daily. 01/28/2018: Per pt as needed.   . warfarin (COUMADIN) 5 MG tablet TAKE ONE TABLET BY MOUTH AS DIRECTED BY COUMADIN CLINIC   . ACCU-CHEK AVIVA PLUS test strip As directed   . fluticasone (FLONASE) 50 MCG/ACT nasal spray Place 1 spray into both nostrils daily. (Patient not taking: Reported on 05/28/2018) 05/28/2018: Patient reports on 05/28/18 is no longer taking   . Insulin Pen Needle 32G X 6 MM MISC Use as instructed to inject Lantus daily. Dx is E11.8   . lisinopril (PRINIVIL,ZESTRIL) 40 MG tablet Take 40 mg by mouth daily. 5/00/3704: Duplicate order   . Melatonin 10 MG TABS Take 10 mg by mouth at bedtime. 05/28/2018: Patient reports on 05/28/18 she no longer takes   No facility-administered encounter medications on file as of 06/18/2018.    Functional Status:   In your present state of health, do you have any difficulty performing the following activities: 01/28/2018 12/18/2017  Hearing? Y -  Comment left ear  -  Vision? N -  Difficulty concentrating or making decisions? N -  Walking or climbing stairs? Y -  Dressing or bathing? N -  Doing errands, shopping? [name]  Preparing Food and eating ? Y -  Using the Toilet? N -  In the past six months, have you accidently leaked urine? Y -  Do you have problems with loss of bowel control? N -  Managing your Medications? N -  Managing your Finances? Y -  Housekeeping or managing your Housekeeping? Y -  Some recent data might be hidden   Fall/Depression Screening:    Fall Risk  06/18/2018 05/28/2018 05/01/2018  Falls in the past year? Yes (No Data) (No Data)  Comment - Reports new fall "about 2 weeks ago;" reports "lost balance" without further explanation; denies  injury after fall; states EMS came and assisted getting her up Denies new/ recent falls  Number falls in past yr: 2 or more 2 or more -  Comment - - -  Injury with Fall? No No -  Risk Factor Category  High Fall Risk High Fall Risk -  Risk for fall due to : History of fall(s);Impaired balance/gait;Impaired mobility;Medication side effect History of fall(s);Impaired balance/gait;Medication side effect Impaired balance/gait;Medication side effect  Risk for fall due to: Comment - - previously provided fall  risks/ prevention education reiterated with patient today  Follow up Falls evaluation completed;Falls prevention discussed;Education provided Falls evaluation completed;Falls prevention discussed;Education provided -   [facility] 2/9 Scores 05/28/2018 01/20/2018 01/15/2017 09/11/2016 07/24/2016  PHQ - 2 Score 0 0 4 0 0  PHQ- 9 Score - - 13 - -   Plan:   Will close [place] CM case, as patient denies further care coordination needs and will place referral for [place] for ongoing educaiton around self-health management of chronic disease state of DM  Will make patient's PCP aware of [facility] CM case closure with transfer to [place] Problem One     Most Recent Value  Care Plan Problem One  Medication mismanagement as evidenced by patient reporting/ demonstration and review of medications through EMR  Role Documenting the Problem One  Care Management [place] for Problem One  Not Active  Megan Long Term Goal   Over the next 22 days, patient will verbalize appropriate and safe medication management strategies at home, as evidenced by patient reporting and demonstration/ review of patient's medication management system at home during [place] Term Goal Start Date  05/28/18  [facility] Long Term Goal Met Date  06/18/18 [facility] Ob[facility] met]  Interventions for Problem One Long Term Goal  Performed in-home medication review, including  patient's weekly pill box,  discussed minor discrepancies discovered and using teach back method, reiterated ongoing plan for patient in filling pill box.  Completed accurate pill box filling for this week and corrected all discrepancies,  discussed possibility of patient using compliance packaging for medications in the future,  patient verbalizes that she will think about this, and declined this option today  Megan CM Short Term Goal #2   Over the next 22 days, patient will begin filling her weekly pill box with carvedilol first, and then will proceed with other medications, as evidenced by patient reporting and visual inspection of pill box during [facility] Obrien outreach  [facility] CM Short Term Goal #2 Start Date  05/28/18  [facility] CM Short Term Goal #2 Met Date  06/18/18 [Goal partially met]  Interventions for Short Term Goal #2  Discussed previous plan developed with patient during last [facility] Obrien home visit, to start pill box filling with carvedilol,  patient reports inconsistent adherence to this plan, and this was re-iterated with patient today using teach back method,  also discussed value of having patient ask her daughter/ caregiver to assist with weekly pill box filling      Megan CM Care Plan Problem Two     Most Recent Value  Care Plan Problem Two  Self-health management deficit for chronic disease state of DM, as evidenced by patient reporting and ongoing elevated blood sugars   Role Documenting the Problem Two  Care Management Coordinator  Care Plan for Problem Two  Active  Interventions for Problem Two Long Term Goal   Discussed previously provided education around self-health management of chronic disease state of DM,  confirmed that patient is able to verbalize appropriate strategies for diabetes self-health management, including taking medications as prescribed,  using insulin accurately/ appropriately,  and attending all provider office visits  Megan Long Term Goal  Over the next 22 days, patient will  be able to verbalize 3 self-health management strategies for management of chronic disease state of DM, as evidenced by patient reporting during [facility] Obrien outreach  Megan Long Term Goal Start Date  05/28/18  Megan Long Term Goal Met Date  06/18/18 [Goal met]  Megan CM Short Term Goal #1   Over the next 30 days, patient will review printed educational material provided to her around A1-C and diet in setting of DM and will discuss her dietary habits with [place] for ongoing education around self-health management of chronic disease state of DM  Megan CM Short Term Goal #1 Start Date  06/18/18  Interventions for Short Term Goal #2   Provided and reviewed using teach back method, new booklet "Living Well with Diabetes," as well as printed EMMI educational material around value/ significance of A1-C levels/ trending, and meal planning/ dietary choices in setting of DM,  encouraged patient to review this material frequently and to engage with [facility] Obrien health Coach,  placed referral for [facility] health Coach     It has been a pleasure caring for Megan Obrien,  Oneta Rack, Obrien, BSN, [employer] Coordinator [place] Management  [phone]

## 2018-06-20 ENCOUNTER — Encounter: Payer: Self-pay | Admitting: *Deleted

## 2018-06-23 ENCOUNTER — Other Ambulatory Visit: Payer: Self-pay

## 2018-06-23 NOTE — Patient Outreach (Signed)
[facility]) Care Management  06/23/2018  Megan Obrien 1945/10/11 [id number]   Medication adherence call to [name] spoke with patient she said she is still taking 1 tablet a day and does not miss any day.[name] is due on Lisinopril under [place].  [place] Management Direct Dial [phone]  Fax [phone] [email or web address]

## 2018-06-29 ENCOUNTER — Telehealth: Payer: Self-pay | Admitting: Primary Care

## 2018-06-29 DIAGNOSIS — R296 Repeated falls: Secondary | ICD-10-CM

## 2018-06-29 DIAGNOSIS — I63429 Cerebral infarction due to embolism of unspecified anterior cerebral artery: Secondary | ICD-10-CM

## 2018-06-29 NOTE — Telephone Encounter (Signed)
Please notify patient that I spoke with the nurse [name]) who comes to visit her every several weeks. She mentioned that Ms. Jamroz has had two falls within the last 6 weeks. Would she be willing to complete home physical therapy again to help with her balance and coordination? I think this would be a good idea, but please let me know.

## 2018-07-01 NOTE — Telephone Encounter (Signed)
Spoken and notified patient of Megan Obrien comments. Patient verbalized understanding.  Patient is agreeable to the home physical therapy

## 2018-07-01 NOTE — Telephone Encounter (Signed)
Noted, referral placed.  

## 2018-07-02 ENCOUNTER — Telehealth: Payer: Self-pay

## 2018-07-02 NOTE — Telephone Encounter (Signed)
Noted thank Edrick Kins, RMA

## 2018-07-02 NOTE — Telephone Encounter (Signed)
Noted, this seems reasonable. Have patient keep [place] updated.  Will also send this to [place] as an [place].

## 2018-07-02 NOTE — Telephone Encounter (Signed)
I wanted to send you a note in regards to the patient and home P.T. Referral. Patient is wondering if the cause of her unsteadiness feeling could be coming from her glasses. She wears glasses she bought OTC at the store and since she had a stroke in March she said she has not seen an ophthalmologist and feels like her vision was affected by that event. Patient called [place] office, she has been there before, their office will call back to give her an appointment. They will send a note to Dr Sandra Cockayne to see if she can be worked in soon. Patient will let me know when her appointment is. She feels that she should wait till she gets her eyes checked out before proceeding with PT to be sure. Please let me know if I need to let patient know anything else, otherwise I am waiting to hear back from her and will follow up in a couple of days with patient if she has not called me back by that time. Thank Edrick Kins, RMA

## 2018-07-03 ENCOUNTER — Telehealth: Payer: Self-pay | Admitting: Cardiovascular Disease

## 2018-07-03 ENCOUNTER — Other Ambulatory Visit: Payer: Self-pay | Admitting: Primary Care

## 2018-07-03 DIAGNOSIS — K219 Gastro-esophageal reflux disease without esophagitis: Secondary | ICD-10-CM

## 2018-07-03 NOTE — Telephone Encounter (Signed)
Please call with pt EF.

## 2018-07-03 NOTE — Telephone Encounter (Signed)
No answer. Left message that patient's last EF in 12/20/2017 was 55-60% and to call back if she has any further questions.

## 2018-07-07 DIAGNOSIS — H40053 Ocular hypertension, bilateral: Secondary | ICD-10-CM | POA: Diagnosis not present

## 2018-07-07 DIAGNOSIS — H2513 Age-related nuclear cataract, bilateral: Secondary | ICD-10-CM | POA: Diagnosis not present

## 2018-07-07 LAB — HM DIABETES EYE EXAM

## 2018-07-10 ENCOUNTER — Telehealth: Payer: Self-pay

## 2018-07-10 NOTE — Telephone Encounter (Signed)
Noted  

## 2018-07-10 NOTE — Telephone Encounter (Signed)
Spoke with patient to follow up on referral for home physical therapy and her opthalmology appointment. Patient did get her eyes checked and the doctor said that her vision was not affected by the stroke and her eyesight is not bad. She is waiting for her new glasses to come in and see if this will help her with balance/unsteady feeling. [facility] representative will reach out to patient still and see when she would like to get this scheduled or if she needs this at all.-Anastasiya Estell Harpin, RMA

## 2018-07-11 ENCOUNTER — Encounter: Payer: Self-pay | Admitting: Primary Care

## 2018-07-15 DIAGNOSIS — G4730 Sleep apnea, unspecified: Secondary | ICD-10-CM | POA: Diagnosis not present

## 2018-07-21 ENCOUNTER — Other Ambulatory Visit: Payer: Self-pay | Admitting: *Deleted

## 2018-07-21 DIAGNOSIS — E1169 Type 2 diabetes mellitus with other specified complication: Secondary | ICD-10-CM | POA: Diagnosis not present

## 2018-07-21 DIAGNOSIS — E782 Mixed hyperlipidemia: Secondary | ICD-10-CM | POA: Diagnosis not present

## 2018-07-21 DIAGNOSIS — E1122 Type 2 diabetes mellitus with diabetic chronic kidney disease: Secondary | ICD-10-CM | POA: Diagnosis not present

## 2018-07-21 DIAGNOSIS — E1165 Type 2 diabetes mellitus with hyperglycemia: Secondary | ICD-10-CM | POA: Diagnosis not present

## 2018-07-21 DIAGNOSIS — Z794 Long term (current) use of insulin: Secondary | ICD-10-CM | POA: Diagnosis not present

## 2018-07-21 NOTE — Patient Outreach (Signed)
[facility]) Care Management  07/21/2018   Megan Obrien 09/27/1946 [id number]  RN Health Coach telephone call to patient.  [employer] compliance verified. Per patient she is doing very good. Patient stated that she had received a good report from her physician. Per patient her A1C is 6. Something from 12.9. RN discussed with patient about eating out. Per patient she eats out 2-3 times a week. Patient is adhering with her diet. RN health coach will work with helping patient to understand healthy foods better. Patient has agreed to follow up outreach calls.   Current Medications:  Current Outpatient Medications  Medication Sig Dispense Refill  . ACCU-CHEK AVIVA PLUS test strip As directed    . acetaminophen (TYLENOL) 650 MG CR tablet Take 500 mg by mouth every 8 (eight) hours as needed for pain.     [name] amLODipine (NORVASC) 5 MG tablet Take 1 tablet by mouth daily for blood pressure. 30 tablet 3  . atorvastatin (LIPITOR) 40 MG tablet Take 1 tablet (40 mg total) by mouth at bedtime. 90 tablet 1  . buPROPion (WELLBUTRIN SR) 150 MG 12 hr tablet TAKE 1 TABLET BY MOUTH TWICE DAILY 180 tablet 1  . carvedilol (COREG) 12.5 MG tablet Take 1.5 tablets (18.75 mg total) by mouth 2 (two) times daily. 135 tablet 0  . ferrous sulfate 325 (65 FE) MG tablet Take 325 mg by mouth daily.    [name] FLUoxetine (PROZAC) 40 MG capsule Take 1 capsule by mouth once daily for depression and anxiety. 90 capsule 1  . fluticasone (FLONASE) 50 MCG/ACT nasal spray Place 1 spray into both nostrils daily. (Patient not taking: Reported on 05/28/2018) 48 g 1  . furosemide (LASIX) 40 MG tablet TAKE 1 TABLET BY MOUTH ONCE DAILY 90 tablet 2  . glimepiride (AMARYL) 2 MG tablet Take 2 mg by mouth daily with breakfast.    . Insulin Glargine (LANTUS) 100 UNIT/ML Solostar Pen Inject 15 Units into the skin every evening. (Patient taking differently: Inject 30 Units into the skin daily. ) 15 mL 1  . Insulin Pen Needle 32G X 6  MM MISC Use as instructed to inject Lantus daily. Dx is E11.8 100 each 2  . lisinopril (PRINIVIL,ZESTRIL) 40 MG tablet Take 40 mg by mouth daily.    [name] lisinopril (PRINIVIL,ZESTRIL) 40 MG tablet TAKE 1 TABLET BY MOUTH ONCE DAILY 30 tablet 11  . Melatonin 10 MG TABS Take 10 mg by mouth at bedtime.    [name] omega-3 acid ethyl esters (LOVAZA) 1 g capsule Take 1 capsule by mouth daily with food for cholesterol. 90 capsule 1  . omeprazole (PRILOSEC) 40 MG capsule TAKE 1 CAPSULE BY MOUTH ONCE DAILY 90 capsule 0  . oxybutynin (DITROPAN-XL) 5 MG 24 hr tablet Take 1 tablet by mouth at bedtime for urinary incontinence. 90 tablet 1  . triamcinolone cream (KENALOG) 0.1 % Apply 1 application topically 2 (two) times daily. 30 g 0  . warfarin (COUMADIN) 5 MG tablet TAKE ONE TABLET BY MOUTH AS DIRECTED BY COUMADIN CLINIC 30 tablet 0   No current facility-administered medications for this visit.     Functional Status:  In your present state of health, do you have any difficulty performing the following activities: 07/21/2018 01/28/2018  Hearing? Y Y  Comment left ear left ear   Vision? N N  Difficulty concentrating or making decisions? N N  Walking or climbing stairs? Y Y  Dressing or bathing? N N  Doing errands, shopping? [name]  Preparing Food and eating ? Y Y  Using the Toilet? N N  In the past six months, have you accidently leaked urine? Y Y  Do you have problems with loss of bowel control? N N  Managing your Medications? N N  Managing your Finances? [name]  Housekeeping or managing your Housekeeping? Y Y  Some recent data might be hidden    Fall/Depression Screening: Fall Risk  07/21/2018 06/18/2018 05/28/2018  Falls in the past year? Yes Yes (No Data)  Comment - - Reports new fall "about 2 weeks ago;" reports "lost balance" without further explanation; denies injury after fall; states EMS came and assisted getting her up  Number falls in past yr: 2 or more 2 or more 2 or more  Comment - - -  Injury  with Fall? No No No  Risk Factor Category  High Fall Risk High Fall Risk High Fall Risk  Risk for fall due to : History of fall(s);Impaired balance/gait;Impaired mobility;Medication side effect History of fall(s);Impaired balance/gait;Impaired mobility;Medication side effect History of fall(s);Impaired balance/gait;Medication side effect  Risk for fall due to: Comment - - -  Follow up Falls evaluation completed;Education provided;Falls prevention discussed Falls evaluation completed;Falls prevention discussed;Education provided Falls evaluation completed;Falls prevention discussed;Education provided   [facility] 2/9 Scores 07/21/2018 05/28/2018 01/20/2018 01/15/2017 09/11/2016 07/24/2016  PHQ - 2 Score 0 0 0 4 0 0  PHQ- 9 Score - - - 13 - -   THN CM Care Plan Problem One     Most Recent Value  Care Plan Problem One  Knowledge Deficit in self management of Diabetes  Role Documenting the Problem One  [place] for Problem One  Active  THN Long Term Goal   Patient will identify types of foods to include in a diabetic diet within the next 90 days  Interventions for Problem One Long Term Goal  RN discussed eating foods out since patient eats 2-3 times a week. RN sent a list of  foods to choose from fast food menu. RN will follow up with further discussion and teach back   THN CM Short Term Goal #1   Patient will verbalize healthy snacks to include in diet within the next 30 days  THN CM Short Term Goal #1 Start Date  07/21/18  Interventions for Short Term Goal #1  Rn discussed eating healthy snacks between meals. RN sent  a list of low carb snacks to patient. RN will follow up up with further discussion and teach back    THN CM Short Term Goal #2   Patient will continue to check blood sugar as per ordered and journal within the next 30 days  THN CM Short Term Goal #2 Start Date  07/21/18  Interventions for Short Term Goal #2  RN discussed montoring blood sugars and documenting and taking to  physician. RN sent a new 2020 calendar book for documentation. RN will follow up for receiving      Assessment A1C is 6.?down from 12.9 Patient is not adhering to her diet Patient will benefit from [place] telephonic outreach for education and support for diabetes self management.  Plan:  RN discussed eating out and making healthy choices RN sent patient diabetic choice menu on eating out at fast food restaurant RN sent low carb snacks RN sent a 2020 calendar book RN sent barriers letter to PCP and Endocrinologist RN will follow up within the month of December  [place]  Management [phone]

## 2018-07-22 ENCOUNTER — Other Ambulatory Visit: Payer: Self-pay | Admitting: Physician Assistant

## 2018-07-22 NOTE — Telephone Encounter (Signed)
Refill Request.  

## 2018-07-24 ENCOUNTER — Other Ambulatory Visit: Payer: Self-pay | Admitting: Physician Assistant

## 2018-07-25 ENCOUNTER — Other Ambulatory Visit: Payer: Self-pay

## 2018-07-25 NOTE — Telephone Encounter (Signed)
Please review for refill. Thanks!  

## 2018-07-25 NOTE — Telephone Encounter (Signed)
Pt overdue to INR check. Attempted to reach pt but unable to leave message.

## 2018-07-25 NOTE — Telephone Encounter (Signed)
Please review for refill on Warfarin 5 mg.

## 2018-07-25 NOTE — Telephone Encounter (Signed)
Patient is out of warfarin medication but states she has been given an emergency amount   Patient has scheduled an appointment for coumadin check on 10/30  Please review for refills

## 2018-08-04 ENCOUNTER — Ambulatory Visit (INDEPENDENT_AMBULATORY_CARE_PROVIDER_SITE_OTHER): Payer: Medicare Other

## 2018-08-04 DIAGNOSIS — I059 Rheumatic mitral valve disease, unspecified: Secondary | ICD-10-CM

## 2018-08-04 DIAGNOSIS — Z5181 Encounter for therapeutic drug level monitoring: Secondary | ICD-10-CM

## 2018-08-04 DIAGNOSIS — I4891 Unspecified atrial fibrillation: Secondary | ICD-10-CM

## 2018-08-04 DIAGNOSIS — Z794 Long term (current) use of insulin: Secondary | ICD-10-CM | POA: Diagnosis not present

## 2018-08-04 DIAGNOSIS — E1165 Type 2 diabetes mellitus with hyperglycemia: Secondary | ICD-10-CM | POA: Diagnosis not present

## 2018-08-04 DIAGNOSIS — I63429 Cerebral infarction due to embolism of unspecified anterior cerebral artery: Secondary | ICD-10-CM

## 2018-08-04 DIAGNOSIS — I8290 Acute embolism and thrombosis of unspecified vein: Secondary | ICD-10-CM

## 2018-08-04 LAB — POCT INR: INR: 3 (ref 2.0–3.0)

## 2018-08-04 NOTE — Patient Instructions (Signed)
Please have a large serving of greens today and continue dosage of coumadin (warfarin) 1 tablet every day except 1/2 tablet on Mondays & Fridays.  Recheck INR in 5 weeks.

## 2018-08-14 ENCOUNTER — Other Ambulatory Visit: Payer: Self-pay | Admitting: Primary Care

## 2018-08-14 DIAGNOSIS — E785 Hyperlipidemia, unspecified: Secondary | ICD-10-CM

## 2018-08-14 MED ORDER — ATORVASTATIN CALCIUM 40 MG PO TABS: 40.0000 mg | ORAL_TABLET | Freq: Every day | ORAL | 1 refills | Status: AC

## 2018-08-15 DIAGNOSIS — G4730 Sleep apnea, unspecified: Secondary | ICD-10-CM | POA: Diagnosis not present

## 2018-08-24 ENCOUNTER — Other Ambulatory Visit: Payer: Self-pay | Admitting: Physician Assistant

## 2018-08-24 DIAGNOSIS — I10 Essential (primary) hypertension: Secondary | ICD-10-CM

## 2018-09-01 ENCOUNTER — Other Ambulatory Visit: Payer: Self-pay | Admitting: Cardiovascular Disease

## 2018-09-01 ENCOUNTER — Other Ambulatory Visit: Payer: Self-pay | Admitting: Primary Care

## 2018-09-01 DIAGNOSIS — E782 Mixed hyperlipidemia: Secondary | ICD-10-CM

## 2018-09-01 DIAGNOSIS — K219 Gastro-esophageal reflux disease without esophagitis: Secondary | ICD-10-CM

## 2018-09-01 NOTE — Telephone Encounter (Signed)
Please review for refill, Thanks !  

## 2018-09-08 ENCOUNTER — Ambulatory Visit (INDEPENDENT_AMBULATORY_CARE_PROVIDER_SITE_OTHER): Payer: Medicare Other

## 2018-09-08 DIAGNOSIS — I059 Rheumatic mitral valve disease, unspecified: Secondary | ICD-10-CM | POA: Diagnosis not present

## 2018-09-08 DIAGNOSIS — Z5181 Encounter for therapeutic drug level monitoring: Secondary | ICD-10-CM

## 2018-09-08 DIAGNOSIS — I8290 Acute embolism and thrombosis of unspecified vein: Secondary | ICD-10-CM

## 2018-09-08 DIAGNOSIS — I63429 Cerebral infarction due to embolism of unspecified anterior cerebral artery: Secondary | ICD-10-CM

## 2018-09-08 DIAGNOSIS — I4891 Unspecified atrial fibrillation: Secondary | ICD-10-CM

## 2018-09-08 LAB — POCT INR: INR: 2 (ref 2.0–3.0)

## 2018-09-08 NOTE — Patient Instructions (Signed)
Please take extra 1/2 today, then continue dosage of coumadin (warfarin) 1 tablet every day except 1/2 tablet on Mondays & Fridays.  Recheck INR in 5 weeks.

## 2018-09-14 DIAGNOSIS — G4730 Sleep apnea, unspecified: Secondary | ICD-10-CM | POA: Diagnosis not present

## 2018-09-17 ENCOUNTER — Other Ambulatory Visit: Payer: Self-pay | Admitting: *Deleted

## 2018-09-17 NOTE — Patient Outreach (Signed)
[facility]) Care Management  09/17/2018  Megan Obrien [birth year] [id number]   RN Health Coach Quarterly Outreach   Outreach Attempt:  Outreach attempt #1 to patient for quarterly follow up. No answer. RN Health Coach left HIPAA compliant voicemail message along with contact information.  Plan:  RN Health Coach will make another outreach attempt within the month of January.   [place] [phone] [email or web address]

## 2018-10-06 ENCOUNTER — Other Ambulatory Visit: Payer: Self-pay | Admitting: Cardiovascular Disease

## 2018-10-06 NOTE — Telephone Encounter (Signed)
Refill Request.  

## 2018-10-13 ENCOUNTER — Ambulatory Visit (INDEPENDENT_AMBULATORY_CARE_PROVIDER_SITE_OTHER): Payer: Medicare Other

## 2018-10-13 DIAGNOSIS — I059 Rheumatic mitral valve disease, unspecified: Secondary | ICD-10-CM

## 2018-10-13 DIAGNOSIS — Z5181 Encounter for therapeutic drug level monitoring: Secondary | ICD-10-CM | POA: Diagnosis not present

## 2018-10-13 DIAGNOSIS — I8290 Acute embolism and thrombosis of unspecified vein: Secondary | ICD-10-CM

## 2018-10-13 DIAGNOSIS — I63429 Cerebral infarction due to embolism of unspecified anterior cerebral artery: Secondary | ICD-10-CM

## 2018-10-13 DIAGNOSIS — I4891 Unspecified atrial fibrillation: Secondary | ICD-10-CM | POA: Diagnosis not present

## 2018-10-13 LAB — POCT INR: INR: 4.1 — AB (ref 2.0–3.0)

## 2018-10-13 NOTE — Patient Instructions (Signed)
Please have a serving of greens today, skip your coumadin tomorrow, then continue dosage of coumadin (warfarin) 1 tablet every day except 1/2 tablet on Mondays & Fridays.  Pick a day each week to have a serving of greens and have them on that same day each week.  Recheck INR in 3 weeks.

## 2018-10-15 ENCOUNTER — Other Ambulatory Visit: Payer: Self-pay | Admitting: *Deleted

## 2018-10-15 DIAGNOSIS — G4730 Sleep apnea, unspecified: Secondary | ICD-10-CM | POA: Diagnosis not present

## 2018-10-15 NOTE — Patient Outreach (Signed)
[facility]) Care Management  10/15/2018  KYIRA VOLKERT [birth year] [id number]   RN Health Coach attempted follow up outreach call to patient.  Patient was unavailable. HIPPA compliance voicemail message left with return callback number.  Plan: RN will call patient again within 30 days.  [place] Care Management [phone]

## 2018-10-27 DIAGNOSIS — N183 Chronic kidney disease, stage 3 (moderate): Secondary | ICD-10-CM | POA: Diagnosis not present

## 2018-10-27 DIAGNOSIS — E1122 Type 2 diabetes mellitus with diabetic chronic kidney disease: Secondary | ICD-10-CM | POA: Diagnosis not present

## 2018-10-27 DIAGNOSIS — E1159 Type 2 diabetes mellitus with other circulatory complications: Secondary | ICD-10-CM | POA: Diagnosis not present

## 2018-10-27 DIAGNOSIS — Z794 Long term (current) use of insulin: Secondary | ICD-10-CM | POA: Diagnosis not present

## 2018-10-27 DIAGNOSIS — E1169 Type 2 diabetes mellitus with other specified complication: Secondary | ICD-10-CM | POA: Diagnosis not present

## 2018-10-30 ENCOUNTER — Other Ambulatory Visit: Payer: Self-pay | Admitting: *Deleted

## 2018-10-30 NOTE — Patient Outreach (Addendum)
[facility]) Care Management  10/30/2018  KAREEN JEFFERYS [birth year] [id number]   [place] attempted#3 follow up outreach call to patient.  Patient was unavailable. No voicemail pick up.  Plan: RN will send unsuccessful outreach letter and closure in 10 business days if no response.   [place] Care Management [phone]

## 2018-11-02 ENCOUNTER — Other Ambulatory Visit: Payer: Self-pay | Admitting: Cardiovascular Disease

## 2018-11-03 ENCOUNTER — Ambulatory Visit (INDEPENDENT_AMBULATORY_CARE_PROVIDER_SITE_OTHER): Payer: Medicare Other

## 2018-11-03 ENCOUNTER — Other Ambulatory Visit: Payer: Self-pay

## 2018-11-03 DIAGNOSIS — Z5181 Encounter for therapeutic drug level monitoring: Secondary | ICD-10-CM | POA: Diagnosis not present

## 2018-11-03 DIAGNOSIS — I63429 Cerebral infarction due to embolism of unspecified anterior cerebral artery: Secondary | ICD-10-CM

## 2018-11-03 DIAGNOSIS — I059 Rheumatic mitral valve disease, unspecified: Secondary | ICD-10-CM | POA: Diagnosis not present

## 2018-11-03 DIAGNOSIS — I4891 Unspecified atrial fibrillation: Secondary | ICD-10-CM

## 2018-11-03 DIAGNOSIS — I8290 Acute embolism and thrombosis of unspecified vein: Secondary | ICD-10-CM | POA: Diagnosis not present

## 2018-11-03 LAB — POCT INR: INR: 4.4 — AB (ref 2.0–3.0)

## 2018-11-03 MED ORDER — CARVEDILOL 12.5 MG PO TABS: 18.7500 mg | ORAL_TABLET | Freq: Two times a day (BID) | ORAL | 0 refills | Status: DC

## 2018-11-03 NOTE — Telephone Encounter (Signed)
Refill Request.  

## 2018-11-03 NOTE — Telephone Encounter (Signed)
Requested Prescriptions   Signed Prescriptions Disp Refills  . carvedilol (COREG) 12.5 MG tablet 135 tablet 0    Sig: Take 1.5 tablets (18.75 mg total) by mouth 2 (two) times daily.    Authorizing Provider: Theora Gianotti    Ordering User: Janan Ridge

## 2018-11-03 NOTE — Patient Instructions (Signed)
Since you have already take your coumadin today, have a serving of greens today, skip your coumadin tomorrow, then continue dosage of coumadin (warfarin) 1 tablet every day except 1/2 tablet on Mondays & Fridays.  Pick a day each week to have a serving of greens and have them on that same day each week.  Try to take your coumadin in the evenings. Recheck INR in 2 weeks.

## 2018-11-10 ENCOUNTER — Other Ambulatory Visit: Payer: Self-pay

## 2018-11-10 DIAGNOSIS — F331 Major depressive disorder, recurrent, moderate: Secondary | ICD-10-CM

## 2018-11-10 NOTE — Telephone Encounter (Signed)
Pt called requesting fluoxetine refill.  Last rx:  02/20/18, #90/1 Last OV:  05/26/18, f/u Next OV:  none

## 2018-11-11 MED ORDER — FLUOXETINE HCL 40 MG PO CAPS: ORAL_CAPSULE | ORAL | 1 refills | Status: AC

## 2018-11-12 ENCOUNTER — Telehealth: Payer: Self-pay | Admitting: Primary Care

## 2018-11-12 DIAGNOSIS — K219 Gastro-esophageal reflux disease without esophagitis: Secondary | ICD-10-CM

## 2018-11-12 NOTE — Telephone Encounter (Signed)
Pt only have 1 pill left for her omeprazole and she need a refill   Sent to [facility]

## 2018-11-13 ENCOUNTER — Other Ambulatory Visit: Payer: Self-pay | Admitting: *Deleted

## 2018-11-13 MED ORDER — OMEPRAZOLE 40 MG PO CPDR: 40.0000 mg | DELAYED_RELEASE_CAPSULE | Freq: Every day | ORAL | 1 refills | Status: AC

## 2018-11-13 NOTE — Telephone Encounter (Signed)
Refill sent as requested. 

## 2018-11-13 NOTE — Patient Outreach (Signed)
[facility]) Care Management  11/13/2018  Megan Obrien 1946-06-09 [id number]   Outreach attempt : Multiple attempt to establish contact with patient without success, No response from letter mailed to patient. Case is being closed at this time.   [place] Care Management [phone]

## 2018-11-13 NOTE — Telephone Encounter (Signed)
Tried to call patient but could not leave message, phone kept ringing.

## 2018-11-15 DIAGNOSIS — G4730 Sleep apnea, unspecified: Secondary | ICD-10-CM | POA: Diagnosis not present

## 2018-11-17 ENCOUNTER — Ambulatory Visit (INDEPENDENT_AMBULATORY_CARE_PROVIDER_SITE_OTHER): Payer: Medicare Other

## 2018-11-17 DIAGNOSIS — I8290 Acute embolism and thrombosis of unspecified vein: Secondary | ICD-10-CM | POA: Diagnosis not present

## 2018-11-17 DIAGNOSIS — I63429 Cerebral infarction due to embolism of unspecified anterior cerebral artery: Secondary | ICD-10-CM

## 2018-11-17 DIAGNOSIS — I4891 Unspecified atrial fibrillation: Secondary | ICD-10-CM | POA: Diagnosis not present

## 2018-11-17 DIAGNOSIS — I059 Rheumatic mitral valve disease, unspecified: Secondary | ICD-10-CM | POA: Diagnosis not present

## 2018-11-17 DIAGNOSIS — Z5181 Encounter for therapeutic drug level monitoring: Secondary | ICD-10-CM | POA: Diagnosis not present

## 2018-11-17 LAB — POCT INR: INR: 4.2 — AB (ref 2.0–3.0)

## 2018-11-17 NOTE — Patient Instructions (Signed)
Please skip your coumadin tonight, then START NEW DOSAGE of coumadin (warfarin) 1/2 tablet every day except 1 tablet on MONDAYS, [place].  Pick a day each week to have a serving of greens and have them on that same day each week.  Try to take your coumadin in the evenings. Recheck INR in 3 weeks.

## 2018-11-20 NOTE — Progress Notes (Signed)
Cardiology Office Note Date:  11/24/2018  Patient ID:  Eulla, Kochanowski 09-10-1946, MRN [id number] PCP:  Pleas Koch, NP  Cardiologist:  Dr. Fletcher Anon, MD    Chief Complaint: Follow up  History of Present Illness: PRISCELLA DONNA is a 73 y.o. female with history of nonobstructive CAD, left atrial appendage thrombus diagnosed in 11/2017 with presumed PAF on Coumadin,recent stroke in 10/7406, chronic diastolic CHF, pulmonary hypertension, CKD stage III, hypertension, hyperlipidemia, mild to moderate mitral stenosis DM 2, morbid obesity, and OSA who presents for follow up.   Patient underwent right and left cardiac catheterization in the summer 2018 for her mitral stenosis which revealed nonobstructive CAD and a moderately elevated right heart filling pressures with severe pulmonary hypertension. Following this, she was placed on Lasix. Patient was [facility] in 11/2017 with dizziness and expressive a aphasia. MRI of the brain showed right frontal and left parietal ischemia with smaller foci of ischemia in the right hippocampus, right parietal lobe, and left peripheral post central gyrus. CTA of the head/neck showed moderate bilateral carotid disease and severe distal MCA branch stenosis with severe bilateral P2 stenoses. TTE on 12/18/2017 showed moderate LVH, mitral stenosis precluded accurate assessment of diastolic function, severely calcified mitral annulus with moderate stenosis, moderately to severely dilated left atrium, RV systolic function normal, moderately dilated right atrium. Patient underwent TEE on 12/20/2017 as part of her stroke work-up which showed normal LV systolic function with moderate mitral stenosis, dilated left atrium, heavy smoke throughout the left atrium and a small mobile thrombus in the LAA.In that setting, Coumadin was initiated with strong presumption of occult PAF. She was evaluated in the ED on 12/27/2017 for a mechanical fall. CT of the head  showed no acute intracranial pathology. Cervical spine CT showed no acute osseous injury. She was seen in the [facility] ED on 03/03/2018 in the setting of hyperglycemia with an initial glucose of 553.  The patient was seen in the office on 03/11/2018 for follow up and at that time, it was unclear who had been managing the patient's INR as there was no evidence of any checks. It was also noted that she had been placed on Eliquis and her Coumadin had been stopped by an outside provider/office. We had a very lengthy discussion with the patient at that time regarding why she actually needs to be on Coumadin rather than a DOAC. She was placed on a Lovenox bridge to Coumadin and referred to the Coumadin Clinic. Since then, she has followed with our Coumadin Clinic.  Outpatient cardiac monitoring in 03/2018 to evaluate for occult A. fib showed sinus rhythm with an average heart rate of 68 bpm, 1 run of wide-complex tachycardia felt to likely be VT lasting 11 beats, short runs of SVT, no evidence of A. fib.  She was last seen in the office in 03/2018 for follow-up and was doing well from a cardiac perspective.  She continued to have concerned over fluctuating blood sugars.  Most recent INR from 11/17/2018 noted to be 4.2.  She comes in doing reasonably well from a cardiac perspective.  Her weight is up 25 pounds however she feels like this is in the setting of significantly decreased activity secondary to bilateral knee pain and increased p.o. intake with snacking and sodas.  She reports her lower extremity swelling is stable and she has gone from a baseline 3 pillow orthopnea down to 2 pillow orthopnea.  She remains on supplemental oxygen at nighttime secondary to  intolerance to CPAP.  No falls since she was last seen.  No BRBPR or melena.  She was dizzy with positional changes this morning which she attributes to having multiple episodes of watery diarrhea all night and early this morning.  She attributes this to some fish she  ate the night prior.  Blood pressure typically runs in the 177L to 390Z systolic however BP this morning and in the office noted to be in the 1 teens systolic.  She also has been drinking less throughout the day.  She reports not drinking much water and fluids consist mostly of soda.  She continues to eat foods high and salt and add salt to her food.  She denies any chest pain, palpitations, presyncope, or syncope.   Past Medical History:  Diagnosis Date  . (HFpEF) heart failure with preserved ejection fraction ([place])    a. 03/2017 Echo: EF 55-60%, no rwma, Gr1 DD, mild to mod MS, mod to sev dil LA.  [name] Arthritis   . Carotid arterial disease ([place])    a. 11/2017 CTA Head/Neck: RICA 45, LICA 60.  [name] Cerebrovascular disease    a. 11/2017 MRA: mod to sev stenosis of bilat PCA's P2 segments; b. 11/2017 CTA Head/Neck: RICA 45, LICA 60, sev bilat distal MCA branch stenoses w/ sev bilat P2 stenoses.  . CKD (chronic kidney disease), stage III ([place])   . Depression   . GERD (gastroesophageal reflux disease)   . Hyperlipidemia   . Hypertension   . Left Atrial Appendage Thrombus    a. 11/2017 TEE (performed in setting of stroke/aphasia): nl EF, mod MS, dil LA w/ heavy smoke and small LAA thrombus-->Warfarin initiated.  . Mitral stenosis    a. Mild to moderate by echo 03/2017; b. 03/2017 R/LHC: PCWP 35mmHg, LVEDP 37mmHg. Mean grad of 7mmHg. Valve area of 2.02cm^2; c. 11/2017 TEE: Mod MS.  . Morbid obesity ([place])   . Murmur   . Non-obstructive CAD (coronary artery disease)    a. 03/2017 Cath: mild, non-obstructive CAD.  [name] OSA (obstructive sleep apnea)    a. Previously wore CPAP for several years but stopped on her own and now just uses O2 via [place] @ night.  [name] PAH (pulmonary artery hypertension) ([place])    a. 03/2017 RHC: PA 36mmHg.  . Seasonal allergies   . Stroke [facility])    a. 11/2017 - presented w/ aphasia->MRI acute to early subacute ischemia w/in multiple vascular territories, largest in R frontal & L parietal lobes.  Smaller foci of ischemia w/in R hippocampus, R parietal lobe, and L peripheral postcentral gyrus.  . Type 2 diabetes mellitus ([place])   . Urinary incontinence     Past Surgical History:  Procedure Laterality Date  . BACK SURGERY    . CARDIAC CATHETERIZATION    . KNEE SURGERY Left   . RIGHT/LEFT HEART CATH AND CORONARY ANGIOGRAPHY Bilateral 04/25/2017   Procedure: Right/Left Heart Cath and Coronary Angiography;  Surgeon: Wellington Hampshire, MD;  Location: [place] [facility];  Service: Cardiovascular;  Laterality: Bilateral;  . TEE WITHOUT CARDIOVERSION N/A 12/20/2017   Procedure: TRANSESOPHAGEAL ECHOCARDIOGRAM (TEE);  Surgeon: Wellington Hampshire, MD;  Location: [facility] ORS;  Service: Cardiovascular;  Laterality: N/A;  . TONSILLECTOMY AND ADENOIDECTOMY  1951    Current Meds  Medication Sig  . ACCU-CHEK AVIVA PLUS test strip As directed  . acetaminophen (TYLENOL) 650 MG CR tablet Take 500 mg by mouth every 8 (eight) hours as needed for pain.   [name] amLODipine (NORVASC)  5 MG tablet TAKE 1 TABLET BY MOUTH ONCE DAILY FOR BLOOD PRESSURE  . atorvastatin (LIPITOR) 40 MG tablet Take 1 tablet (40 mg total) by mouth at bedtime.  . carvedilol (COREG) 12.5 MG tablet Take 1.5 tablets (18.75 mg total) by mouth 2 (two) times daily.  [name] FLUoxetine (PROZAC) 40 MG capsule Take 1 capsule by mouth once daily for depression and anxiety.  . fluticasone (FLONASE) 50 MCG/ACT nasal spray Place 1 spray into both nostrils daily.  . furosemide (LASIX) 40 MG tablet TAKE 1 TABLET BY MOUTH ONCE DAILY  . glimepiride (AMARYL) 2 MG tablet Take 2 mg by mouth daily with breakfast.  . Insulin Glargine (LANTUS) 100 UNIT/ML Solostar Pen Inject 15 Units into the skin every evening. (Patient taking differently: Inject 30 Units into the skin daily. )  . Insulin Pen Needle 32G X 6 MM MISC Use as instructed to inject Lantus daily. Dx is E11.8  . lisinopril (PRINIVIL,ZESTRIL) 40 MG tablet Take 40 mg by mouth daily.  . Melatonin 10 MG TABS  Take 10 mg by mouth at bedtime.  [name] omega-3 acid ethyl esters (LOVAZA) 1 g capsule TAKE 1 CAPSULE BY MOUTH ONCE DAILY WITH FOOD FOR CHOLESTEROL  . omeprazole (PRILOSEC) 40 MG capsule Take 1 capsule (40 mg total) by mouth daily.  [name] oxybutynin (DITROPAN-XL) 5 MG 24 hr tablet Take 1 tablet by mouth at bedtime for urinary incontinence.  . triamcinolone cream (KENALOG) 0.1 % Apply 1 application topically 2 (two) times daily.  [name] warfarin (COUMADIN) 5 MG tablet TAKE 1 TABLET BY MOUTH AS DIRECTED BY  THE  COUMADIN  CLINIC    Allergies:   Patient has no known allergies.   Social History:  The patient  reports that she has never smoked. She has never used smokeless tobacco. She reports that she does not drink alcohol or use drugs.   Family History:  The patient's family history includes Alzheimer's disease in her mother; Lung cancer in her father; Stroke in her maternal grandfather.  ROS:   Review of Systems  Constitutional: Positive for malaise/fatigue. Negative for chills, diaphoresis, fever and weight loss.  HENT: Negative for congestion.   Eyes: Negative for discharge and redness.  Respiratory: Positive for cough and shortness of breath. Negative for hemoptysis, sputum production and wheezing.   Cardiovascular: Negative for chest pain, palpitations, orthopnea, claudication, leg swelling and PND.  Gastrointestinal: Positive for abdominal pain and diarrhea. Negative for blood in stool, constipation, heartburn, melena, nausea and vomiting.  Genitourinary: Negative for hematuria.  Musculoskeletal: Positive for joint pain. Negative for falls and myalgias.  Skin: Negative for rash.  Neurological: Positive for dizziness and weakness. Negative for tingling, tremors, sensory change, speech change, focal weakness and loss of consciousness.  Endo/Heme/Allergies: Does not bruise/bleed easily.  Psychiatric/Behavioral: Negative for substance abuse. The patient is not nervous/anxious.   All other systems  reviewed and are negative.    PHYSICAL EXAM:  VS:  BP 112/60 (BP Location: Left Arm, Patient Position: Sitting, Cuff Size: Large)   Pulse 63   Ht 5\' 6"  (1.676 m)   Wt 285 lb 12 oz (129.6 kg)   BMI 46.12 kg/m  BMI: Body mass index is 46.12 kg/m.  Physical Exam  Constitutional: She is oriented to person, place, and time. She appears well-developed and well-nourished.  HENT:  Head: Normocephalic and atraumatic.  Eyes: Right eye exhibits no discharge. Left eye exhibits no discharge.  Neck: Normal range of motion. No JVD present.  Cardiovascular: Normal rate, regular  rhythm, S1 normal and S2 normal. Exam reveals no distant heart sounds, no friction rub, no midsystolic click and no opening snap.  Murmur heard.  Low-pitched rumbling crescendo presystolic murmur is present with a grade of 2/6 at the apex. Pulses:      Posterior tibial pulses are 2+ on the right side and 2+ on the left side.  Pulmonary/Chest: Effort normal and breath sounds normal. No respiratory distress. She has no decreased breath sounds. She has no wheezes. She has no rales. She exhibits no tenderness.  Abdominal: Soft. She exhibits no distension. There is no abdominal tenderness.  Musculoskeletal:        General: Edema present.     Comments: Trace to 1+ bilateral lower extremity pitting edema with hyperpigmentation consistent with chronic venous insufficiency.  Neurological: She is alert and oriented to person, place, and time.  Skin: Skin is warm and dry. No cyanosis. Nails show no clubbing.  Psychiatric: She has a normal mood and affect. Her speech is normal and behavior is normal. Judgment and thought content normal.     EKG:  Was ordered and interpreted by me today. Shows NSR, 63 bpm, left axis deviation, first-degree AV block, poor R wave progression along the precordial leads, no acute ST-T changes  Recent Labs: 03/03/2018: ALT 14 04/11/2018: BUN 34; Creatinine, Ser 1.59; Hemoglobin 12.3; Platelets 194; Potassium  4.5; Sodium 137  02/06/2018: Cholesterol 139; HDL 41.10; LDL Cholesterol 68; Total CHOL/HDL Ratio 3; Triglycerides 148.0; VLDL 29.6   CrCl cannot be calculated (Patient's most recent lab result is older than the maximum 21 days allowed.).   Wt Readings from Last 3 Encounters:  11/24/18 285 lb 12 oz (129.6 kg)  05/26/18 267 lb 12 oz (121.5 kg)  04/11/18 260 lb (117.9 kg)     Other studies reviewed: Additional studies/records reviewed today include: summarized above  ASSESSMENT AND PLAN:  1. Nonobstructive CAD: She is doing well without any symptoms concerning for angina.  On Coumadin in place of aspirin.  Continue current medical therapy.  2. Presumed A. fib/left atrial appendage thrombus: Currently maintaining sinus rhythm.  Outpatient cardiac monitoring in 2019 did not show any evidence of A. fib.  She remains on Coumadin for presumed A. fib in the setting of prior stroke and left atrial appendage thrombus noted in 11/2017.  Most recent INR supratherapeutic at 4.2.  Follow-up with Coumadin clinic as directed.  I will have her next appointment be with her primary cardiologist for evaluation as to if Coumadin should be continued moving forward given documented A. fib has not been found and she is approximately 12 months out from diagnosed left atrial appendage thrombus status post Coumadin.  Continue Coreg for rate control of presumed A. fib.  3. Moderate mitral stenosis: Schedule repeat echocardiogram for late 11/2018.  4. Prior stroke: No residual deficits.  On Coumadin as above.  Remains on Lipitor.  Optimal blood pressure and heart rate control.  5. Chronic diastolic CHF/pulmonary hypertension: Her weight is up 25 pounds from her visit in 03/2018 though she attributes this to sedentary lifestyle secondary to bilateral knee pain and poor dietary choices with frequent snacking.  She does have stable lower extremity swelling which is likely in the setting of chronic venous insufficiency with  dependent edema secondary to morbid obesity, sedentary lifestyle, and calcium channel blocker usage.  She has recently had several bouts of watery diarrhea within the past 24 hours and in the setting may be somewhat volume depleted.  We will check  a CBC, BMP, and BNP.  With her positional dizziness, she appears to not tolerate a blood pressure in the 1 teens systolic which is likely transiently low in the setting of volume depletion secondary to diarrhea.  We will discontinue her amlodipine given her dizziness, relative hypotension, and lower extremity swelling.  Check echocardiogram as above.  For now, continue Lasix 40 mg daily, however if labs checked today show evidence of a prerenal state we will need to de-escalate this.  With regards to her pulmonary hypertension, this is likely in setting of poorly treated sleep apnea as she is intolerant to CPAP.  6. CKD stage III: Most recent serum creatinine noted to be 1.8 from 07/2018 with a baseline approximately 1.4-1.6.  Check BMP.  7. Obesity/OSA: Intolerant to CPAP.  Remains on nocturnal oxygen.  Weight loss is advised.  8. Hypertension: Blood pressure is well controlled today, though she appears to be symptomatic with positional dizziness which is likely exacerbated by volume depletion secondary to watery diarrhea.  Stop amlodipine.  Continue Coreg, Lasix and lisinopril.  9. Hyperlipidemia: Most recent LDL of 85 from 07/2018 with normal liver function at that time.  Given her history of stroke, goal LDL is less than 70.  She reports significant snacking and poor dietary choices lately.  Recommend heart healthy diet.  If LDL remains above goal in follow-up would escalate Lipitor to 80 mg daily.  Disposition: F/u with Dr. Fletcher Anon in 2 to 3 months following the above echocardiogram.  Current medicines are reviewed at length with the patient today.  The patient did not have any concerns regarding medicines.  Signed, [name], PA-C 11/24/2018 1:28 PM      [address] [place], [place] [address] [phone]

## 2018-11-24 ENCOUNTER — Ambulatory Visit: Payer: Medicare Other | Admitting: Physician Assistant

## 2018-11-24 ENCOUNTER — Encounter: Payer: Self-pay | Admitting: Physician Assistant

## 2018-11-24 VITALS — BP 112/60 | HR 63 | Ht 66.0 in | Wt 285.8 lb

## 2018-11-24 DIAGNOSIS — I272 Pulmonary hypertension, unspecified: Secondary | ICD-10-CM

## 2018-11-24 DIAGNOSIS — I480 Paroxysmal atrial fibrillation: Secondary | ICD-10-CM | POA: Diagnosis not present

## 2018-11-24 DIAGNOSIS — E785 Hyperlipidemia, unspecified: Secondary | ICD-10-CM

## 2018-11-24 DIAGNOSIS — I2510 Atherosclerotic heart disease of native coronary artery without angina pectoris: Secondary | ICD-10-CM

## 2018-11-24 DIAGNOSIS — I10 Essential (primary) hypertension: Secondary | ICD-10-CM | POA: Diagnosis not present

## 2018-11-24 DIAGNOSIS — I050 Rheumatic mitral stenosis: Secondary | ICD-10-CM

## 2018-11-24 DIAGNOSIS — I4891 Unspecified atrial fibrillation: Secondary | ICD-10-CM | POA: Diagnosis not present

## 2018-11-24 DIAGNOSIS — I513 Intracardiac thrombosis, not elsewhere classified: Secondary | ICD-10-CM | POA: Diagnosis not present

## 2018-11-24 DIAGNOSIS — I5032 Chronic diastolic (congestive) heart failure: Secondary | ICD-10-CM

## 2018-11-24 DIAGNOSIS — G4733 Obstructive sleep apnea (adult) (pediatric): Secondary | ICD-10-CM

## 2018-11-24 NOTE — Patient Instructions (Signed)
Medication Instructions:  Your physician has recommended you make the following change in your medication:  1- STOP Amlodipine  If you need a refill on your cardiac medications before your next appointment, please call your pharmacy.   Lab work: Your physician recommends that you return for lab work today (BNP, BMP, CBC)  If you have labs (blood work) drawn today and your tests are completely normal, you will receive your results only by: [name] MyChart Message (if you have MyChart) OR . A paper copy in the mail If you have any lab test that is abnormal or we need to change your treatment, we will call you to review the results.  Testing/Procedures: 1- Echo Echo  Please return to [facility] on ______________ at _______________ AM/PM for an Echocardiogram. Your physician has requested that you have an echocardiogram. Echocardiography is a painless test that uses sound waves to create images of your heart. It provides your doctor with information about the size and shape of your heart and how well your heart's chambers and valves are working. This procedure takes approximately one hour. There are no restrictions for this procedure. Please note; depending on visual quality an IV may need to be placed.    Follow-Up: At [facility], you and your health needs are our priority.  As part of our continuing mission to provide you with exceptional heart care, we have created designated Provider Care Teams.  These Care Teams include your primary Cardiologist (physician) and Advanced Practice Providers (APPs -  Physician Assistants and Nurse Practitioners) who all work together to provide you with the care you need, when you need it. You will need a follow up appointment in 2-3 months.  Please see [name], MD.

## 2018-11-25 ENCOUNTER — Telehealth: Payer: Self-pay | Admitting: *Deleted

## 2018-11-25 DIAGNOSIS — N183 Chronic kidney disease, stage 3 unspecified: Secondary | ICD-10-CM

## 2018-11-25 DIAGNOSIS — I2510 Atherosclerotic heart disease of native coronary artery without angina pectoris: Secondary | ICD-10-CM

## 2018-11-25 DIAGNOSIS — I4891 Unspecified atrial fibrillation: Secondary | ICD-10-CM

## 2018-11-25 LAB — CBC
HEMATOCRIT: 35.2 % (ref 34.0–46.6)
Hemoglobin: 11.8 g/dL (ref 11.1–15.9)
MCH: 29.4 pg (ref 26.6–33.0)
MCHC: 33.5 g/dL (ref 31.5–35.7)
MCV: 88 fL (ref 79–97)
Platelets: 211 10*3/uL (ref 150–450)
RBC: 4.01 x10E6/uL (ref 3.77–5.28)
RDW: 13 % (ref 11.7–15.4)
WBC: 8.3 10*3/uL (ref 3.4–10.8)

## 2018-11-25 LAB — BASIC METABOLIC PANEL
BUN / CREAT RATIO: 19 (ref 12–28)
BUN: 35 mg/dL — ABNORMAL HIGH (ref 8–27)
CHLORIDE: 102 mmol/L (ref 96–106)
CO2: 23 mmol/L (ref 20–29)
Calcium: 9.1 mg/dL (ref 8.7–10.3)
Creatinine, Ser: 1.83 mg/dL — ABNORMAL HIGH (ref 0.57–1.00)
GFR calc non Af Amer: 27 mL/min/{1.73_m2} — ABNORMAL LOW (ref >59)
GFR, EST AFRICAN AMERICAN: 31 mL/min/{1.73_m2} — AB (ref >59)
Glucose: 130 mg/dL — ABNORMAL HIGH (ref 65–99)
Potassium: 4.7 mmol/L (ref 3.5–5.2)
Sodium: 142 mmol/L (ref 134–144)

## 2018-11-25 LAB — BRAIN NATRIURETIC PEPTIDE: BNP: 257.7 pg/mL — ABNORMAL HIGH (ref 0.0–100.0)

## 2018-11-25 MED ORDER — FUROSEMIDE 20 MG PO TABS: 20.0000 mg | ORAL_TABLET | Freq: Every day | ORAL | 3 refills | Status: DC

## 2018-11-25 NOTE — Telephone Encounter (Signed)
-----   Message from Rise Mu, PA-C sent at 11/25/2018  1:50 PM EST ----- Renal function slightly worse than patient's baseline of 1.4-1.6 with a value of 1.83.  Compared to recent readings, patient's glucose is significantly improved.  Potassium stable at 4.7.  BNP minimally elevated at 257.  CBC normal.  With slight worsening of renal function, recommend she decrease Lasix to 20 mg daily.  Follow-up BMP in 1 week.  If renal function continues to worsen, we may need to hold her lisinopril.

## 2018-11-25 NOTE — Telephone Encounter (Signed)
Results called to pt. Pt verbalized understanding of results and to decrease furosemide to 20 mg daily. Rx sent to pharmacy. Patient verbalized understanding to go to [place] in 1 week for repeat lab work. She was appreciative.

## 2018-12-01 ENCOUNTER — Other Ambulatory Visit
Admission: RE | Admit: 2018-12-01 | Discharge: 2018-12-01 | Disposition: A | Discharge status: Home / Self Care | Payer: Medicare Other | Source: Ambulatory Visit | Attending: Physician Assistant | Admitting: Physician Assistant

## 2018-12-01 ENCOUNTER — Telehealth: Payer: Self-pay

## 2018-12-01 DIAGNOSIS — N183 Chronic kidney disease, stage 3 unspecified: Secondary | ICD-10-CM

## 2018-12-01 DIAGNOSIS — I4891 Unspecified atrial fibrillation: Secondary | ICD-10-CM

## 2018-12-01 DIAGNOSIS — I2510 Atherosclerotic heart disease of native coronary artery without angina pectoris: Secondary | ICD-10-CM

## 2018-12-01 LAB — BASIC METABOLIC PANEL
Anion gap: 9 (ref 5–15)
BUN: 27 mg/dL — ABNORMAL HIGH (ref 8–23)
CO2: 26 mmol/L (ref 22–32)
Calcium: 8.8 mg/dL — ABNORMAL LOW (ref 8.9–10.3)
Chloride: 105 mmol/L (ref 98–111)
Creatinine, Ser: 1.35 mg/dL — ABNORMAL HIGH (ref 0.44–1.00)
GFR calc Af Amer: 45 mL/min — ABNORMAL LOW (ref >60)
GFR calc non Af Amer: 39 mL/min — ABNORMAL LOW (ref >60)
GLUCOSE: 106 mg/dL — AB (ref 70–99)
POTASSIUM: 4.1 mmol/L (ref 3.5–5.1)
Sodium: 140 mmol/L (ref 135–145)

## 2018-12-01 NOTE — Telephone Encounter (Signed)
-----   Message from Rise Mu, PA-C sent at 12/01/2018 11:52 AM EST ----- Renal function is improved and back to her baseline. Potassium at goal.  Continue lower dose Lasix 20 mg daily.

## 2018-12-01 NOTE — Telephone Encounter (Signed)
Attempted to contact the patient regarding her lab results. Unable to lmom, pt phone rings out.

## 2018-12-02 NOTE — Telephone Encounter (Signed)
-----   Message from Rise Mu, PA-C sent at 12/01/2018 11:52 AM EST ----- Renal function is improved and back to her baseline. Potassium at goal.  Continue lower dose Lasix 20 mg daily.

## 2018-12-02 NOTE — Telephone Encounter (Signed)
Patient made aware of lab results and [name], PA recommendation with verbalized understanding.

## 2018-12-08 ENCOUNTER — Ambulatory Visit (INDEPENDENT_AMBULATORY_CARE_PROVIDER_SITE_OTHER): Payer: Medicare Other

## 2018-12-08 DIAGNOSIS — I8290 Acute embolism and thrombosis of unspecified vein: Secondary | ICD-10-CM

## 2018-12-08 DIAGNOSIS — I63429 Cerebral infarction due to embolism of unspecified anterior cerebral artery: Secondary | ICD-10-CM | POA: Diagnosis not present

## 2018-12-08 DIAGNOSIS — I4891 Unspecified atrial fibrillation: Secondary | ICD-10-CM | POA: Diagnosis not present

## 2018-12-08 DIAGNOSIS — Z5181 Encounter for therapeutic drug level monitoring: Secondary | ICD-10-CM

## 2018-12-08 DIAGNOSIS — I059 Rheumatic mitral valve disease, unspecified: Secondary | ICD-10-CM | POA: Diagnosis not present

## 2018-12-08 LAB — POCT INR: INR: 2.2 (ref 2.0–3.0)

## 2018-12-08 NOTE — Patient Instructions (Signed)
Please continue dosage of coumadin (warfarin) 1/2 tablet every day except 1 tablet on MONDAYS, [place].  Pick a day each week to have a serving of greens and have them on that same day each week.  Try to take your coumadin in the evenings. Recheck INR in 4 weeks.

## 2018-12-13 ENCOUNTER — Other Ambulatory Visit: Payer: Self-pay | Admitting: Cardiovascular Disease

## 2018-12-14 DIAGNOSIS — G4730 Sleep apnea, unspecified: Secondary | ICD-10-CM | POA: Diagnosis not present

## 2018-12-15 ENCOUNTER — Other Ambulatory Visit: Payer: Self-pay | Admitting: *Deleted

## 2018-12-15 MED ORDER — CARVEDILOL 12.5 MG PO TABS: 18.7500 mg | ORAL_TABLET | Freq: Two times a day (BID) | ORAL | 1 refills | Status: DC

## 2018-12-16 ENCOUNTER — Other Ambulatory Visit: Payer: Self-pay | Admitting: Physician Assistant

## 2018-12-16 DIAGNOSIS — I059 Rheumatic mitral valve disease, unspecified: Secondary | ICD-10-CM

## 2018-12-23 ENCOUNTER — Telehealth: Payer: Self-pay | Admitting: Cardiovascular Disease

## 2018-12-23 NOTE — Telephone Encounter (Signed)
Patient called to cancel Echo.  Per provider note ok to reschedule. Will call back when scheduling.

## 2018-12-24 NOTE — Telephone Encounter (Signed)
Fwd to triage to assess

## 2018-12-25 ENCOUNTER — Other Ambulatory Visit: Payer: Medicare Other

## 2018-12-31 ENCOUNTER — Telehealth: Payer: Self-pay | Admitting: Physician Assistant

## 2018-12-31 NOTE — Telephone Encounter (Signed)
Pt states she has been bleeding for 3 days. States she is bleeding on her arms and when she blows her nose. Please call and advise.

## 2018-12-31 NOTE — Telephone Encounter (Signed)
Spoke w/ pt.  She reports frequent nosebleeds in the past few days and that she "almost bled to death" after her cat scratched her. She reports that she called EMS on Monday evening, but "they didn't do anything because of the covid". Advised pt that if she is actively bleeding, she should call 911 or proceed to the ED- she denies bleeding now. Asked pt if she can come to the drive thru for an INR check, but she states that she does not have anyone that can bring her today. She held her coumadin last night and wants to know how long she should continue to hold. Advised her that I cannot give her that recommendation w/o knowing what her INR is, but would not hold it more than 2 days. Advised her to have a serving of greens today, she reports that she has some broccoli, but not much of an appetite. She is sched for INR check on Monday, 01/05/19.

## 2019-01-01 ENCOUNTER — Other Ambulatory Visit: Payer: Self-pay

## 2019-01-01 ENCOUNTER — Inpatient Hospital Stay
Admission: EM | Admit: 2019-01-01 | Discharge: 2019-01-05 | DRG: 872 | Disposition: A | Discharge status: Skilled Nursing Facility | Payer: Medicare Other | Attending: Internal Medicine | Admitting: Internal Medicine

## 2019-01-01 ENCOUNTER — Emergency Department: Payer: Medicare Other

## 2019-01-01 ENCOUNTER — Telehealth: Payer: Self-pay | Admitting: Cardiovascular Disease

## 2019-01-01 DIAGNOSIS — Z794 Long term (current) use of insulin: Secondary | ICD-10-CM

## 2019-01-01 DIAGNOSIS — F419 Anxiety disorder, unspecified: Secondary | ICD-10-CM | POA: Diagnosis present

## 2019-01-01 DIAGNOSIS — Z801 Family history of malignant neoplasm of trachea, bronchus and lung: Secondary | ICD-10-CM | POA: Diagnosis not present

## 2019-01-01 DIAGNOSIS — A419 Sepsis, unspecified organism: Secondary | ICD-10-CM | POA: Diagnosis not present

## 2019-01-01 DIAGNOSIS — I130 Hypertensive heart and chronic kidney disease with heart failure and stage 1 through stage 4 chronic kidney disease, or unspecified chronic kidney disease: Secondary | ICD-10-CM | POA: Diagnosis present

## 2019-01-01 DIAGNOSIS — G4733 Obstructive sleep apnea (adult) (pediatric): Secondary | ICD-10-CM | POA: Diagnosis present

## 2019-01-01 DIAGNOSIS — N3000 Acute cystitis without hematuria: Secondary | ICD-10-CM | POA: Diagnosis not present

## 2019-01-01 DIAGNOSIS — I5032 Chronic diastolic (congestive) heart failure: Secondary | ICD-10-CM | POA: Diagnosis present

## 2019-01-01 DIAGNOSIS — R531 Weakness: Secondary | ICD-10-CM | POA: Diagnosis not present

## 2019-01-01 DIAGNOSIS — R791 Abnormal coagulation profile: Secondary | ICD-10-CM | POA: Diagnosis not present

## 2019-01-01 DIAGNOSIS — R0689 Other abnormalities of breathing: Secondary | ICD-10-CM | POA: Diagnosis not present

## 2019-01-01 DIAGNOSIS — M2550 Pain in unspecified joint: Secondary | ICD-10-CM | POA: Diagnosis not present

## 2019-01-01 DIAGNOSIS — Z8673 Personal history of transient ischemic attack (TIA), and cerebral infarction without residual deficits: Secondary | ICD-10-CM

## 2019-01-01 DIAGNOSIS — I679 Cerebrovascular disease, unspecified: Secondary | ICD-10-CM | POA: Diagnosis present

## 2019-01-01 DIAGNOSIS — K219 Gastro-esophageal reflux disease without esophagitis: Secondary | ICD-10-CM | POA: Diagnosis present

## 2019-01-01 DIAGNOSIS — I2510 Atherosclerotic heart disease of native coronary artery without angina pectoris: Secondary | ICD-10-CM | POA: Diagnosis present

## 2019-01-01 DIAGNOSIS — F329 Major depressive disorder, single episode, unspecified: Secondary | ICD-10-CM | POA: Diagnosis present

## 2019-01-01 DIAGNOSIS — I4891 Unspecified atrial fibrillation: Secondary | ICD-10-CM | POA: Diagnosis present

## 2019-01-01 DIAGNOSIS — R0600 Dyspnea, unspecified: Secondary | ICD-10-CM | POA: Diagnosis not present

## 2019-01-01 DIAGNOSIS — N183 Chronic kidney disease, stage 3 (moderate): Secondary | ICD-10-CM | POA: Diagnosis present

## 2019-01-01 DIAGNOSIS — Z820 Family history of epilepsy and other diseases of the nervous system: Secondary | ICD-10-CM | POA: Diagnosis not present

## 2019-01-01 DIAGNOSIS — R000 Tachycardia, unspecified: Secondary | ICD-10-CM | POA: Diagnosis not present

## 2019-01-01 DIAGNOSIS — M6281 Muscle weakness (generalized): Secondary | ICD-10-CM | POA: Diagnosis not present

## 2019-01-01 DIAGNOSIS — E1122 Type 2 diabetes mellitus with diabetic chronic kidney disease: Secondary | ICD-10-CM | POA: Diagnosis not present

## 2019-01-01 DIAGNOSIS — R404 Transient alteration of awareness: Secondary | ICD-10-CM | POA: Diagnosis not present

## 2019-01-01 DIAGNOSIS — N390 Urinary tract infection, site not specified: Secondary | ICD-10-CM | POA: Diagnosis not present

## 2019-01-01 DIAGNOSIS — I482 Chronic atrial fibrillation, unspecified: Secondary | ICD-10-CM | POA: Diagnosis not present

## 2019-01-01 DIAGNOSIS — I10 Essential (primary) hypertension: Secondary | ICD-10-CM | POA: Diagnosis not present

## 2019-01-01 DIAGNOSIS — M1990 Unspecified osteoarthritis, unspecified site: Secondary | ICD-10-CM | POA: Diagnosis present

## 2019-01-01 DIAGNOSIS — I2721 Secondary pulmonary arterial hypertension: Secondary | ICD-10-CM | POA: Diagnosis present

## 2019-01-01 DIAGNOSIS — E785 Hyperlipidemia, unspecified: Secondary | ICD-10-CM | POA: Diagnosis present

## 2019-01-01 DIAGNOSIS — I050 Rheumatic mitral stenosis: Secondary | ICD-10-CM | POA: Diagnosis present

## 2019-01-01 DIAGNOSIS — Z7901 Long term (current) use of anticoagulants: Secondary | ICD-10-CM | POA: Diagnosis not present

## 2019-01-01 DIAGNOSIS — R498 Other voice and resonance disorders: Secondary | ICD-10-CM | POA: Diagnosis not present

## 2019-01-01 DIAGNOSIS — Z7401 Bed confinement status: Secondary | ICD-10-CM | POA: Diagnosis not present

## 2019-01-01 DIAGNOSIS — Z823 Family history of stroke: Secondary | ICD-10-CM | POA: Diagnosis not present

## 2019-01-01 DIAGNOSIS — K5909 Other constipation: Secondary | ICD-10-CM | POA: Diagnosis not present

## 2019-01-01 DIAGNOSIS — E119 Type 2 diabetes mellitus without complications: Secondary | ICD-10-CM | POA: Diagnosis not present

## 2019-01-01 DIAGNOSIS — M25561 Pain in right knee: Secondary | ICD-10-CM | POA: Diagnosis not present

## 2019-01-01 LAB — URINALYSIS, COMPLETE (UACMP) WITH MICROSCOPIC
Bilirubin Urine: NEGATIVE
Glucose, UA: NEGATIVE mg/dL
Ketones, ur: 5 mg/dL — AB
Nitrite: NEGATIVE
Protein, ur: 100 mg/dL — AB
RBC / HPF: >50 RBC/hpf — ABNORMAL HIGH (ref 0–5)
Specific Gravity, Urine: 1.023 (ref 1.005–1.030)
WBC, UA: >50 WBC/hpf — ABNORMAL HIGH (ref 0–5)
pH: 6 (ref 5.0–8.0)

## 2019-01-01 LAB — PROTIME-INR
INR: >10 (ref 0.8–1.2)
Prothrombin Time: >90 seconds — ABNORMAL HIGH (ref 11.4–15.2)

## 2019-01-01 LAB — COMPREHENSIVE METABOLIC PANEL
ALT: 26 U/L (ref 0–44)
AST: 40 U/L (ref 15–41)
Albumin: 3 g/dL — ABNORMAL LOW (ref 3.5–5.0)
Alkaline Phosphatase: 84 U/L (ref 38–126)
Anion gap: 14 (ref 5–15)
BUN: 36 mg/dL — ABNORMAL HIGH (ref 8–23)
CO2: 25 mmol/L (ref 22–32)
Calcium: 8.6 mg/dL — ABNORMAL LOW (ref 8.9–10.3)
Chloride: 101 mmol/L (ref 98–111)
Creatinine, Ser: 1.67 mg/dL — ABNORMAL HIGH (ref 0.44–1.00)
GFR calc Af Amer: 35 mL/min — ABNORMAL LOW (ref >60)
GFR calc non Af Amer: 30 mL/min — ABNORMAL LOW (ref >60)
Glucose, Bld: 119 mg/dL — ABNORMAL HIGH (ref 70–99)
Potassium: 2.8 mmol/L — ABNORMAL LOW (ref 3.5–5.1)
Sodium: 140 mmol/L (ref 135–145)
Total Bilirubin: 2 mg/dL — ABNORMAL HIGH (ref 0.3–1.2)
Total Protein: 6.9 g/dL (ref 6.5–8.1)

## 2019-01-01 LAB — CBC
HCT: 34 % — ABNORMAL LOW (ref 36.0–46.0)
Hemoglobin: 10.8 g/dL — ABNORMAL LOW (ref 12.0–15.0)
MCH: 28.6 pg (ref 26.0–34.0)
MCHC: 31.8 g/dL (ref 30.0–36.0)
MCV: 89.9 fL (ref 80.0–100.0)
Platelets: 323 10*3/uL (ref 150–400)
RBC: 3.78 MIL/uL — ABNORMAL LOW (ref 3.87–5.11)
RDW: 13.9 % (ref 11.5–15.5)
WBC: 11.8 10*3/uL — ABNORMAL HIGH (ref 4.0–10.5)
nRBC: 0 % (ref 0.0–0.2)

## 2019-01-01 LAB — GLUCOSE, CAPILLARY
Glucose-Capillary: 112 mg/dL — ABNORMAL HIGH (ref 70–99)
Glucose-Capillary: 119 mg/dL — ABNORMAL HIGH (ref 70–99)
Glucose-Capillary: 161 mg/dL — ABNORMAL HIGH (ref 70–99)
Glucose-Capillary: 123 mg/dL — ABNORMAL HIGH (ref 70–99)

## 2019-01-01 LAB — TSH: TSH: 4.38 u[IU]/mL (ref 0.350–4.500)

## 2019-01-01 LAB — LACTIC ACID, PLASMA
Lactic Acid, Venous: 1.4 mmol/L (ref 0.5–1.9)
Lactic Acid, Venous: 1.8 mmol/L (ref 0.5–1.9)

## 2019-01-01 LAB — CK: Total CK: 223 U/L (ref 38–234)

## 2019-01-01 MED ORDER — POTASSIUM CHLORIDE IN NACL 40-0.9 MEQ/L-% IV SOLN
INTRAVENOUS | Status: DC
  Administered 2019-01-01 (×2): 100 mL/h via INTRAVENOUS
  Filled 2019-01-01 (×5): qty 1000

## 2019-01-01 MED ORDER — SODIUM CHLORIDE 0.9 % IV SOLN
1.0000 g | Freq: Once | INTRAVENOUS | Status: AC
  Administered 2019-01-01: 1 g via INTRAVENOUS
  Filled 2019-01-01: qty 10

## 2019-01-01 MED ORDER — SODIUM CHLORIDE 0.9 % IV SOLN
1.0000 g | INTRAVENOUS | Status: DC
  Administered 2019-01-02: 1 g via INTRAVENOUS
  Filled 2019-01-01: qty 1
  Filled 2019-01-01: qty 10

## 2019-01-01 MED ORDER — CARVEDILOL 12.5 MG PO TABS
18.7500 mg | ORAL_TABLET | Freq: Two times a day (BID) | ORAL | Status: DC
  Administered 2019-01-01 – 2019-01-05 (×9): 18.75 mg via ORAL
  Filled 2019-01-01: qty 6
  Filled 2019-01-01 (×4): qty 1.5
  Filled 2019-01-01: qty 6
  Filled 2019-01-01 (×2): qty 1.5
  Filled 2019-01-01: qty 6
  Filled 2019-01-01: qty 2
  Filled 2019-01-01 (×3): qty 1.5
  Filled 2019-01-01 (×2): qty 6

## 2019-01-01 MED ORDER — ONDANSETRON HCL 4 MG/2ML IJ SOLN: 4.0000 mg | Freq: Four times a day (QID) | INTRAMUSCULAR | Status: DC | PRN

## 2019-01-01 MED ORDER — ACETAMINOPHEN 325 MG PO TABS
650.0000 mg | ORAL_TABLET | Freq: Four times a day (QID) | ORAL | Status: DC | PRN
  Administered 2019-01-01 – 2019-01-04 (×5): 650 mg via ORAL
  Filled 2019-01-01 (×6): qty 2

## 2019-01-01 MED ORDER — INSULIN ASPART 100 UNIT/ML ~~LOC~~ SOLN
0.0000 [IU] | Freq: Three times a day (TID) | SUBCUTANEOUS | Status: DC
  Administered 2019-01-01: 4 [IU] via SUBCUTANEOUS
  Administered 2019-01-02: 7 [IU] via SUBCUTANEOUS
  Administered 2019-01-02 (×2): 3 [IU] via SUBCUTANEOUS
  Administered 2019-01-03 (×2): 4 [IU] via SUBCUTANEOUS
  Administered 2019-01-03: 10:00:00 3 [IU] via SUBCUTANEOUS
  Administered 2019-01-04: 17:00:00 4 [IU] via SUBCUTANEOUS
  Administered 2019-01-04 – 2019-01-05 (×2): 3 [IU] via SUBCUTANEOUS
  Filled 2019-01-01 (×10): qty 1

## 2019-01-01 MED ORDER — INSULIN GLARGINE 100 UNIT/ML ~~LOC~~ SOLN
15.0000 [IU] | Freq: Every day | SUBCUTANEOUS | Status: DC
  Administered 2019-01-01: 15 [IU] via SUBCUTANEOUS
  Filled 2019-01-01: qty 0.15

## 2019-01-01 MED ORDER — ACETAMINOPHEN 650 MG RE SUPP: 650.0000 mg | Freq: Four times a day (QID) | RECTAL | Status: DC | PRN

## 2019-01-01 MED ORDER — ONDANSETRON HCL 4 MG PO TABS
4.0000 mg | ORAL_TABLET | Freq: Four times a day (QID) | ORAL | Status: DC | PRN
  Filled 2019-01-01: qty 1

## 2019-01-01 MED ORDER — SODIUM CHLORIDE 0.9 % IV BOLUS
1000.0000 mL | Freq: Once | INTRAVENOUS | Status: AC
  Administered 2019-01-01: 1000 mL via INTRAVENOUS

## 2019-01-01 MED ORDER — WARFARIN - PHARMACIST DOSING INPATIENT
Freq: Every day | Status: DC
  Administered 2019-01-04: 17:00:00

## 2019-01-01 MED ORDER — LISINOPRIL 20 MG PO TABS
40.0000 mg | ORAL_TABLET | Freq: Every day | ORAL | Status: DC
  Administered 2019-01-01 – 2019-01-05 (×5): 40 mg via ORAL
  Filled 2019-01-01 (×5): qty 2

## 2019-01-01 MED ORDER — OMEGA-3-ACID ETHYL ESTERS 1 G PO CAPS
1.0000 g | ORAL_CAPSULE | Freq: Every day | ORAL | Status: DC
  Administered 2019-01-01 – 2019-01-05 (×5): 1 g via ORAL
  Filled 2019-01-01 (×5): qty 1

## 2019-01-01 MED ORDER — ATORVASTATIN CALCIUM 20 MG PO TABS
40.0000 mg | ORAL_TABLET | Freq: Every day | ORAL | Status: DC
  Administered 2019-01-01 – 2019-01-04 (×4): 40 mg via ORAL
  Filled 2019-01-01 (×5): qty 2

## 2019-01-01 MED ORDER — INSULIN GLARGINE 100 UNIT/ML ~~LOC~~ SOLN
30.0000 [IU] | Freq: Every day | SUBCUTANEOUS | Status: DC
  Filled 2019-01-01: qty 0.3

## 2019-01-01 MED ORDER — FLUOXETINE HCL 20 MG PO CAPS
40.0000 mg | ORAL_CAPSULE | Freq: Every day | ORAL | Status: DC
  Administered 2019-01-01 – 2019-01-05 (×5): 40 mg via ORAL
  Filled 2019-01-01 (×5): qty 2

## 2019-01-01 MED ORDER — DOCUSATE SODIUM 100 MG PO CAPS
100.0000 mg | ORAL_CAPSULE | Freq: Two times a day (BID) | ORAL | Status: DC
  Administered 2019-01-02 – 2019-01-05 (×7): 100 mg via ORAL
  Filled 2019-01-01 (×7): qty 1

## 2019-01-01 MED ORDER — INSULIN GLARGINE 100 UNIT/ML ~~LOC~~ SOLN
30.0000 [IU] | Freq: Every day | SUBCUTANEOUS | Status: DC
  Administered 2019-01-02 – 2019-01-05 (×4): 30 [IU] via SUBCUTANEOUS
  Filled 2019-01-01 (×5): qty 0.3

## 2019-01-01 MED ORDER — PHYTONADIONE 5 MG PO TABS
10.0000 mg | ORAL_TABLET | Freq: Once | ORAL | Status: AC
  Administered 2019-01-01: 10 mg via ORAL
  Filled 2019-01-01: qty 2

## 2019-01-01 MED ORDER — SODIUM CHLORIDE 0.9 % IV SOLN: 1.0000 g | Freq: Once | INTRAVENOUS | Status: DC

## 2019-01-01 NOTE — ED Notes (Signed)
Attempt iv insertion x1 without success. [name], rn to attempt ultrasound guided iv insertion. Pt advised of [place] procedure. Pt verbalizes understanding.

## 2019-01-01 NOTE — ED Notes (Addendum)
Greater than 10.0 INR called from lab. Dr. Owens Shark notified. No new verbal orders received.

## 2019-01-01 NOTE — ED Notes (Signed)
Blood work was collected by lab.

## 2019-01-01 NOTE — ED Notes (Signed)
Report to [facility]

## 2019-01-01 NOTE — Telephone Encounter (Signed)
Returned a call to the daughter and she states that she had already spoke to someone and they had gave her information on this. Since I don't see any documentation I gathered the information and She stated that her mom has been doubling up on her Coumadin since last visit on 12/08/2018 and this is probably why she is bleeding. She stated that someone told her the INR was greater than 10.0 but she hasn't heard from the ER. Advised that they are probably working to treat her and may be giving her meds to reverse this. She stated mom was weak and not well and that they are more than likely trying to find out what is going on and treating her.  Also, advised that once the ER has a plan they would more than likely call her with an update since she is a contact person for the pt. She stated this helped ease her mind to know that they are taking care of her mom. She states she is at her mom's appt and staying close in the case they need her. Advised that is a good idea and she could call back if needed.  Pt does have an appt on Monday to have INR checked and advised her that if the pt is still in hospital we can cancel it and if the pt is home we will reach out to her for details on getting INR checked at that time.

## 2019-01-01 NOTE — Progress Notes (Signed)
CODE SEPSIS - PHARMACY COMMUNICATION  **Broad Spectrum Antibiotics should be administered within 1 hour of Sepsis diagnosis**  Time Code Sepsis Called/Page Received: @ [phone]  Antibiotics Ordered: ceftriaxone   Time of 1st antibiotic administration: @ [phone]  Additional action taken by pharmacy: N/A  If necessary, Name of Provider/Nurse Contacted: N/A  Pernell Dupre, PharmD, BCPS Clinical Pharmacist 01/01/2019 6:29 AM

## 2019-01-01 NOTE — ED Notes (Signed)
[name], rn attempting US guided iv insertion.

## 2019-01-01 NOTE — ED Notes (Signed)
Lab requested for venipuncture assist.

## 2019-01-01 NOTE — ED Triage Notes (Addendum)
Pt got up to use commode around midnight per ems. Pt was too weak to get up from commode and sat on commode for around 4 hours. Ems states fsbs 124. Pt has had diarrhea. md at bedside. Pt with bloody oozing wounds noted to bilateral forearms, bruising noted to arms, and dark purple bruising noted left abd wall.

## 2019-01-01 NOTE — ED Notes (Signed)
Lab here for venipuncture assist.  

## 2019-01-01 NOTE — ED Notes (Signed)
Report to liz, rn.

## 2019-01-01 NOTE — Consult Note (Signed)
ANTICOAGULATION CONSULT NOTE - Initial Consult  Pharmacy Consult for Warfarin Dosing and Monitoring  Indication: atrial fibrillation  No Known Allergies  Patient Measurements: Height: 5\' 6"  (167.6 cm) Weight: (!) 340 lb (154.2 kg) IBW/kg (Calculated) : 59.3  Vital Signs: Temp: 98 F (36.7 C) (04/02 0435) Temp Source: Oral (04/02 0435) BP: 177/89 (04/02 0615) Pulse Rate: 91 (04/02 0615)  Labs: Recent Labs    01/01/19 0504  HGB 10.8*  HCT 34.0*  PLT 323  LABPROT >90.0*  INR >10.0*  CREATININE 1.67*  CKTOTAL 223    Estimated Creatinine Clearance: 45.4 mL/min (A) (by C-G formula based on SCr of 1.67 mg/dL (H)).   Medical History: Past Medical History:  Diagnosis Date  . (HFpEF) heart failure with preserved ejection fraction ([place])    a. 03/2017 Echo: EF 55-60%, no rwma, Gr1 DD, mild to mod MS, mod to sev dil LA.  [name] Arthritis   . Carotid arterial disease ([place])    a. 11/2017 CTA Head/Neck: RICA 45, LICA 60.  [name] Cerebrovascular disease    a. 11/2017 MRA: mod to sev stenosis of bilat PCA's P2 segments; b. 11/2017 CTA Head/Neck: RICA 45, LICA 60, sev bilat distal MCA branch stenoses w/ sev bilat P2 stenoses.  . CKD (chronic kidney disease), stage III ([place])   . Depression   . GERD (gastroesophageal reflux disease)   . Hyperlipidemia   . Hypertension   . Left Atrial Appendage Thrombus    a. 11/2017 TEE (performed in setting of stroke/aphasia): nl EF, mod MS, dil LA w/ heavy smoke and small LAA thrombus-->Warfarin initiated.  . Mitral stenosis    a. Mild to moderate by echo 03/2017; b. 03/2017 R/LHC: PCWP 69mmHg, LVEDP 59mmHg. Mean grad of 62mmHg. Valve area of 2.02cm^2; c. 11/2017 TEE: Mod MS.  . Morbid obesity ([place])   . Murmur   . Non-obstructive CAD (coronary artery disease)    a. 03/2017 Cath: mild, non-obstructive CAD.  [name] OSA (obstructive sleep apnea)    a. Previously wore CPAP for several years but stopped on her own and now just uses O2 via [place] @ night.  [name] PAH (pulmonary  artery hypertension) ([place])    a. 03/2017 RHC: PA 49mmHg.  . Seasonal allergies   . Stroke [facility])    a. 11/2017 - presented w/ aphasia->MRI acute to early subacute ischemia w/in multiple vascular territories, largest in R frontal & L parietal lobes. Smaller foci of ischemia w/in R hippocampus, R parietal lobe, and L peripheral postcentral gyrus.  . Type 2 diabetes mellitus ([place])   . Urinary incontinence    Assessment: Pharmacy consulted for warfarin dosing and monitoring for a 73 yo female w/PMH of A.fib. Patient was taking warfarin 5mg  PTA.  INR on admission was >10. Patient received Vitamin K 10mg  x 1 dose.   DATE INR DOSE 4/2 >10 Held  Goal of Therapy:  INR 2-3 Monitor platelets by anticoagulation protocol: Yes   Plan:  4/2 Warfarin will be held today. INR ordered with AM labs. Pharmacy will continue to follow and order warfarin when INR in therapeutic range.  Will monitor CBC at least every 3 days per protocol.   Pernell Dupre, PharmD, BCPS Clinical Pharmacist 01/01/2019 7:08 AM

## 2019-01-01 NOTE — Telephone Encounter (Signed)
Patient calling to also speak with the nurse about coumadin level.  Please call.

## 2019-01-01 NOTE — Progress Notes (Signed)
Patient ID: Megan Obrien, female   DOB: October 04, 1945, 73 y.o.   MRN: [id number]  Pt seen and examined. Came in with weakness and found to have UTI INR>10 received po vit K 10 mg in ER Some oozing from skin abrasion--dressing+ PT to seen when able to.  D/w dter Santiago Glad on the phone

## 2019-01-01 NOTE — Telephone Encounter (Signed)
If Home Health RN is calling please get Coumadin Nurse on the phone STAT  1.  Are you calling in regards to an appointment? No   2.  Are you calling for a refill ? no  3.  Are you having bleeding issues? Yes  4.  Do you need clearance to hold Coumadin? No    See also previous notes about bleeding.  Patient currently in [facility] ed .  Daughter just discovered patient has been accidentaly doubling coumadin dosage since march 9th .    Please call to advise.  Daughter would also like the nurse to touch base with ED as she cannot do so at this time due to COVID restrictions.    Please route to the Coumadin Clinic Pool

## 2019-01-01 NOTE — ED Notes (Signed)
ED TO INPATIENT HANDOFF REPORT  ED Nurse Name and Phone #: Henderson Newcomer [phone]  S Name/Age/Gender Megan Obrien 73 y.o. female Room/Bed: ED12A/ED12A  Code Status   Code Status: Prior  Home/SNF/Other Home Patient oriented to: self place time and situation Is this baseline? Yes   Triage Complete: Triage complete  Chief Complaint weakness  Triage Note Pt got up to use commode around midnight per ems. Pt was too weak to get up from commode and sat on commode for around 4 hours. Ems states fsbs 124. Pt has had diarrhea. md at bedside. Pt with bloody oozing wounds noted to bilateral forearms, bruising noted to arms, and dark purple bruising noted left abd wall.    Allergies No Known Allergies  Level of Care/Admitting Diagnosis ED Disposition    ED Disposition Condition [place] [facility] Area: [place] [[id number]]  Level of Care: Med-Surg [16]  Diagnosis: Sepsis [facility]) [[id number]]  Admitting Physician: Harrie Foreman [[id number]]  Attending Physician: Harrie Foreman [[id number]]  Estimated length of stay: past midnight tomorrow  Certification:: I certify this patient will need inpatient services for at least 2 midnights  PT Class (Do Not Modify): Inpatient [101]  PT Acc Code (Do Not Modify): Private [1]       B Medical/Surgery History Past Medical History:  Diagnosis Date  . (HFpEF) heart failure with preserved ejection fraction ([place])    a. 03/2017 Echo: EF 55-60%, no rwma, Gr1 DD, mild to mod MS, mod to sev dil LA.  [name] Arthritis   . Carotid arterial disease ([place])    a. 11/2017 CTA Head/Neck: RICA 45, LICA 60.  [name] Cerebrovascular disease    a. 11/2017 MRA: mod to sev stenosis of bilat PCA's P2 segments; b. 11/2017 CTA Head/Neck: RICA 45, LICA 60, sev bilat distal MCA branch stenoses w/ sev bilat P2 stenoses.  . CKD (chronic kidney disease), stage III ([place])   . Depression   . GERD (gastroesophageal reflux disease)   . Hyperlipidemia   .  Hypertension   . Left Atrial Appendage Thrombus    a. 11/2017 TEE (performed in setting of stroke/aphasia): nl EF, mod MS, dil LA w/ heavy smoke and small LAA thrombus-->Warfarin initiated.  . Mitral stenosis    a. Mild to moderate by echo 03/2017; b. 03/2017 R/LHC: PCWP 7mmHg, LVEDP 67mmHg. Mean grad of 63mmHg. Valve area of 2.02cm^2; c. 11/2017 TEE: Mod MS.  . Morbid obesity ([place])   . Murmur   . Non-obstructive CAD (coronary artery disease)    a. 03/2017 Cath: mild, non-obstructive CAD.  [name] OSA (obstructive sleep apnea)    a. Previously wore CPAP for several years but stopped on her own and now just uses O2 via [place] @ night.  [name] PAH (pulmonary artery hypertension) ([place])    a. 03/2017 RHC: PA 106mmHg.  . Seasonal allergies   . Stroke [facility])    a. 11/2017 - presented w/ aphasia->MRI acute to early subacute ischemia w/in multiple vascular territories, largest in R frontal & L parietal lobes. Smaller foci of ischemia w/in R hippocampus, R parietal lobe, and L peripheral postcentral gyrus.  . Type 2 diabetes mellitus ([place])   . Urinary incontinence    Past Surgical History:  Procedure Laterality Date  . BACK SURGERY    . CARDIAC CATHETERIZATION    . KNEE SURGERY Left   . RIGHT/LEFT HEART CATH AND CORONARY ANGIOGRAPHY Bilateral 04/25/2017   Procedure: Right/Left Heart Cath and Coronary Angiography;  Surgeon:  Wellington Hampshire, MD;  Location: [place] [facility];  Service: Cardiovascular;  Laterality: Bilateral;  . TEE WITHOUT CARDIOVERSION N/A 12/20/2017   Procedure: TRANSESOPHAGEAL ECHOCARDIOGRAM (TEE);  Surgeon: Wellington Hampshire, MD;  Location: [facility] ORS;  Service: Cardiovascular;  Laterality: N/A;  . [place]     A IV Location/Drains/Wounds Patient Lines/Drains/Airways Status   Active Line/Drains/Airways    Name:   Placement date:   Placement time:   Site:   Days:   Peripheral IV 01/01/19 Right Antecubital   01/01/19    0530    Antecubital   less than 1   External  Urinary Catheter   01/01/19    0544    -   less than 1          Intake/Output Last 24 hours  Intake/Output Summary (Last 24 hours) at 01/01/2019 0743 Last data filed at 01/01/2019 0656 Gross per 24 hour  Intake 100 ml  Output -  Net 100 ml    Labs/Imaging Results for orders placed or performed during the hospital encounter of 01/01/19 (from the past 48 hour(s))  CBC     Status: Abnormal   Collection Time: 01/01/19  5:04 AM  Result Value Ref Range   WBC 11.8 (H) 4.0 - 10.5 K/uL   RBC 3.78 (L) 3.87 - 5.11 MIL/uL   Hemoglobin 10.8 (L) 12.0 - 15.0 g/dL   HCT 34.0 (L) 36.0 - 46.0 %   MCV 89.9 80.0 - 100.0 fL   MCH 28.6 26.0 - 34.0 pg   MCHC 31.8 30.0 - 36.0 g/dL   RDW 13.9 11.5 - 15.5 %   Platelets 323 150 - 400 K/uL   nRBC 0.0 0.0 - 0.2 %    Comment: Performed at [facility], [address] [place], [place] [address]  Urinalysis, Complete w Microscopic     Status: Abnormal   Collection Time: 01/01/19  5:04 AM  Result Value Ref Range   Color, Urine AMBER (A) YELLOW    Comment: BIOCHEMICALS MAY BE AFFECTED BY COLOR   APPearance TURBID (A) CLEAR   Specific Gravity, Urine 1.023 1.005 - 1.030   pH 6.0 5.0 - 8.0   Glucose, UA NEGATIVE NEGATIVE mg/dL   Hgb urine dipstick LARGE (A) NEGATIVE   Bilirubin Urine NEGATIVE NEGATIVE   Ketones, ur 5 (A) NEGATIVE mg/dL   Protein, ur 100 (A) NEGATIVE mg/dL   Nitrite NEGATIVE NEGATIVE   Leukocytes,Ua MODERATE (A) NEGATIVE   RBC / HPF >50 (H) 0 - 5 RBC/hpf   WBC, UA >50 (H) 0 - 5 WBC/hpf   Bacteria, UA MANY (A) NONE SEEN   Squamous Epithelial / LPF 6-10 0 - 5   WBC Clumps PRESENT     Comment: Performed at [facility], [address] [place], [place] [address]  Comprehensive metabolic panel     Status: Abnormal   Collection Time: 01/01/19  5:04 AM  Result Value Ref Range   Sodium 140 135 - 145 mmol/L   Potassium 2.8 (L) 3.5 - 5.1 mmol/L   Chloride 101 98 - 111 mmol/L   CO2 25 22 - 32 mmol/L   Glucose, Bld 119  (H) 70 - 99 mg/dL   BUN 36 (H) 8 - 23 mg/dL   Creatinine, Ser 1.67 (H) 0.44 - 1.00 mg/dL   Calcium 8.6 (L) 8.9 - 10.3 mg/dL   Total Protein 6.9 6.5 - 8.1 g/dL   Albumin 3.0 (L) 3.5 - 5.0 g/dL  AST 40 15 - 41 U/L   ALT 26 0 - 44 U/L   Alkaline Phosphatase 84 38 - 126 U/L   Total Bilirubin 2.0 (H) 0.3 - 1.2 mg/dL   GFR calc non Af Amer 30 (L) >60 mL/min   GFR calc Af Amer 35 (L) >60 mL/min   Anion gap 14 5 - 15    Comment: Performed at [facility], [address] [place], [place] [address]  Protime-INR     Status: Abnormal   Collection Time: 01/01/19  5:04 AM  Result Value Ref Range   Prothrombin Time >90.0 (H) 11.4 - 15.2 seconds   INR >10.0 (HH) 0.8 - 1.2    Comment: CRITICAL RESULT CALLED TO, READ BACK BY AND VERIFIED WITH: APRIL BUMGUARD AT 4650 ON 01/01/2019 JJB (NOTE) INR goal varies based on device and disease states. Performed at [facility], [address] [place], [place] [address]   CK     Status: None   Collection Time: 01/01/19  5:04 AM  Result Value Ref Range   Total CK 223 38 - 234 U/L    Comment: Performed at [facility], [address] [place], [place] [address]  Lactic acid, plasma     Status: None   Collection Time: 01/01/19  6:36 AM  Result Value Ref Range   Lactic Acid, Venous 1.4 0.5 - 1.9 mmol/L    Comment: Performed at [facility], [address] [place], [place] [address]   Dg Chest Portable 1 View  Result Date: 01/01/2019 CLINICAL DATA:  Dyspnea and weakness after diarrhea. EXAM: PORTABLE CHEST 1 VIEW COMPARISON:  10/10/2016 FINDINGS: Chronic interstitial coarsening. No air bronchogram or Kerley lines. Normal heart size and mediastinal contours. Mitral annular calcification. Osteopenia and generalized degenerative disease. IMPRESSION: No acute finding when compared to prior. Electronically Signed   By: Monte Fantasia M.D.   On: 01/01/2019 06:08    Pending Labs Unresulted Labs (From admission, onward)     Start     Ordered   01/02/19 0500  Protime-INR  Tomorrow morning,   STAT     01/01/19 0700   01/02/19 0500  CBC  Tomorrow morning,   STAT     01/01/19 0700   01/01/19 0616  Lactic acid, plasma  Now then every 2 hours,   STAT     01/01/19 0623   01/01/19 0616  Blood Culture (routine x 2)  BLOOD CULTURE X 2,   STAT     01/01/19 0623   01/01/19 0612  Lactic acid, plasma  STAT Now then every 3 hours,   STAT     01/01/19 0611   01/01/19 0445  Urine culture  ONCE - STAT,   STAT     01/01/19 0444   Signed and Held  TSH  Add-on,   R     Signed and Held          Vitals/Pain Today's Vitals   01/01/19 0435 01/01/19 0545 01/01/19 0600 01/01/19 0615  BP: (!) 122/94   (!) 177/89  Pulse: (!) 110 87 89 91  Resp: (!) 24 (!) 23 20 18   Temp: 98 F (36.7 C)     TempSrc: Oral     SpO2: 94% 100% 95% 94%  Weight:      Height:      PainSc:        Isolation Precautions No active isolations  Medications Medications  Warfarin - Pharmacist Dosing Inpatient (has no administration in  time range)  sodium chloride 0.9 % bolus 1,000 mL (1,000 mLs Intravenous New Bag/Given 01/01/19 0532)  cefTRIAXone (ROCEPHIN) 1 g in sodium chloride 0.9 % 100 mL IVPB (0 g Intravenous Stopped 01/01/19 0656)  phytonadione (VITAMIN K) tablet 10 mg (10 mg Oral Given 01/01/19 0656)    Mobility walks with device High fall risk   Focused Assessments Cardiac Assessment Handoff:  Cardiac Rhythm: Atrial fibrillation Lab Results  Component Value Date   CKTOTAL 223 01/01/2019   TROPONINI <0.03 12/17/2017   No results found for: DDIMER Does the Patient currently have chest pain? No     R Recommendations: See Admitting Provider Note  Report given to:   Additional Notes: Lab got second blood culture.  She was very hard stick.  Several times tried with ultrasound before success.  Also has multiple bleeding areas on arms--mostly from very small cat scratches, but they continue to bleed.

## 2019-01-01 NOTE — ED Notes (Signed)
Waiting on pharmacy to send vitamin K.

## 2019-01-01 NOTE — ED Notes (Signed)
Lab is with patietn drawing blood culture.

## 2019-01-01 NOTE — ED Provider Notes (Signed)
[facility] Emergency Department Provider Note    First MD Initiated Contact with Patient 01/01/19 0430     (approximate)  I have reviewed the triage vital signs and the nursing notes.   HISTORY  Chief Complaint Weakness    HPI Megan Obrien is a 73 y.o. female with medical history as listed below   presents to the emergency department with generalized weakness.  Patient states that she went to use bathroom at approximately 12:00 and was unable to stand up from the commode and as such was on the commode for approximately 4-1/2 hours.  Patient denies any discomfort.  Patient denies any nausea or vomiting.  Patient denies any fever no chest pain no shortness of breath.        Past Medical History:  Diagnosis Date  . (HFpEF) heart failure with preserved ejection fraction ([place])    a. 03/2017 Echo: EF 55-60%, no rwma, Gr1 DD, mild to mod MS, mod to sev dil LA.  [name] Arthritis   . Carotid arterial disease ([place])    a. 11/2017 CTA Head/Neck: RICA 45, LICA 60.  [name] Cerebrovascular disease    a. 11/2017 MRA: mod to sev stenosis of bilat PCA's P2 segments; b. 11/2017 CTA Head/Neck: RICA 45, LICA 60, sev bilat distal MCA branch stenoses w/ sev bilat P2 stenoses.  . CKD (chronic kidney disease), stage III ([place])   . Depression   . GERD (gastroesophageal reflux disease)   . Hyperlipidemia   . Hypertension   . Left Atrial Appendage Thrombus    a. 11/2017 TEE (performed in setting of stroke/aphasia): nl EF, mod MS, dil LA w/ heavy smoke and small LAA thrombus-->Warfarin initiated.  . Mitral stenosis    a. Mild to moderate by echo 03/2017; b. 03/2017 R/LHC: PCWP 71mHg, LVEDP 112mg. Mean grad of 99m2m. Valve area of 2.02cm^2; c. 11/2017 TEE: Mod MS.  . Morbid obesity ([place] . [place]   . Non-obstructive CAD (coronary artery disease)    a. 03/2017 Cath: mild, non-obstructive CAD.  . OMarland KitchenA (obstructive sleep apnea)    a. Previously wore CPAP for several years but stopped on  her own and now just uses O2 via [place] @ night.  . PMarland KitchenH (pulmonary artery hypertension) (HCCWalker  a. 03/2017 RHC: PA 9m4m  . Seasonal allergies   . [facility]. 11/2017 - presented w/ aphasia->MRI acute to early subacute ischemia w/in multiple vascular territories, largest in R frontal & L parietal lobes. Smaller foci of ischemia w/in R hippocampus, R parietal lobe, and L peripheral postcentral gyrus.  . Type 2 diabetes mellitus (HCC)[place]. Urinary incontinence     Patient Active Problem List   Diagnosis Date Noted  . Sepsis (HCC)[place]/11/2018  . History of medication noncompliance 05/26/2018  . Encounter for therapeutic drug monitoring 04/15/2018  . Vaginal bleeding 02/06/2018  . Expressive aphasia 12/18/2017  . Stroke (HCC)[place]/20/2019  . Mitral valve disease   . Exertional dyspnea 04/17/2017  . Fatigue 02/20/2017  . 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 Medicare annual wellness visit, subsequent 07/24/2016  . Hyperlipidemia 02/06/2016  . Essential hypertension 02/06/2016  . Type 2 diabetes mellitus with complication, without long-term current use of insulin (HCC)[place]/05/2016  . Depression 02/06/2016  . GERD (gastroesophageal reflux disease) 02/06/2016  . Urge incontinence of urine 02/06/2016    Past Surgical History:  Procedure Laterality Date  . BACK SURGERY    . CARDIAC CATHETERIZATION    .  KNEE SURGERY Left   . RIGHT/LEFT HEART CATH AND CORONARY ANGIOGRAPHY Bilateral 04/25/2017   Procedure: Right/Left Heart Cath and Coronary Angiography;  Surgeon: Wellington [place], MD;  Location: [place] [facility];  Service: Cardiovascular;  Laterality: Bilateral;  . TEE WITHOUT CARDIOVERSION N/A 12/20/2017   Procedure: TRANSESOPHAGEAL ECHOCARDIOGRAM (TEE);  Surgeon: Wellington [place], MD;  Location: [facility] ORS;  Service: Cardiovascular;  Laterality: N/A;  . [place]    Prior to Admission medications   Medication Sig Start Date End Date Taking? Authorizing Provider   ACCU-CHEK AVIVA PLUS test strip As directed 04/07/18   [provider]  acetaminophen (TYLENOL) 650 MG CR tablet Take 500 mg by mouth every 8 (eight) hours as needed for pain.     [provider]  atorvastatin (LIPITOR) 40 MG tablet Take 1 tablet (40 mg total) by mouth at bedtime. 08/14/18   Pleas Koch, NP  carvedilol (COREG) 12.5 MG tablet Take 1.5 tablets (18.75 mg total) by mouth 2 (two) times daily. 12/15/18   Theora Gianotti, NP  FLUoxetine (PROZAC) 40 MG capsule Take 1 capsule by mouth once daily for depression and anxiety. 11/11/18   Pleas Koch, NP  fluticasone (FLONASE) 50 MCG/ACT nasal spray Place 1 spray into both nostrils daily. 02/21/17   Pleas Koch, NP  furosemide (LASIX) 20 MG tablet Take 1 tablet (20 mg total) by mouth daily. 11/25/18   Dunn, Areta Haber, PA-C  glimepiride (AMARYL) 2 MG tablet Take 2 mg by mouth daily with breakfast.    Warnell Forester, NP  Insulin Glargine (LANTUS) 100 UNIT/ML Solostar Pen Inject 15 Units into the skin every evening. Patient taking differently: Inject 30 Units into the skin daily.  04/09/18   Pleas Koch, NP  Insulin Pen Needle 32G X 6 MM MISC Use as instructed to inject Lantus daily. Dx is E11.8 02/14/18   Pleas Koch, NP  lisinopril (PRINIVIL,ZESTRIL) 40 MG tablet Take 40 mg by mouth daily.    [provider]  Melatonin 10 MG TABS Take 10 mg by mouth at bedtime.    [provider]  omega-3 acid ethyl esters (LOVAZA) 1 g capsule TAKE 1 CAPSULE BY MOUTH ONCE DAILY WITH FOOD FOR CHOLESTEROL 09/01/18   Pleas Koch, NP  omeprazole (PRILOSEC) 40 MG capsule Take 1 capsule (40 mg total) by mouth daily. 11/13/18   Pleas Koch, NP  oxybutynin (DITROPAN-XL) 5 MG 24 hr tablet Take 1 tablet by mouth at bedtime for urinary incontinence. 04/08/18   Pleas Koch, NP  triamcinolone cream (KENALOG) 0.1 % Apply 1 application topically 2 (two) times daily. 08/30/16   Pleas Koch, NP  warfarin (COUMADIN) 5 MG tablet TAKE ONE TABLET BY MOUTH AS DIRECTED BY THE COUMADIN CLINIC 12/15/18   Wellington Hampshire, MD    Allergies Patient has no known allergies.  Family History  Problem Relation Age of Onset  . Lung cancer Father   . Alzheimer's disease Mother   . Stroke Maternal Grandfather     Social History Social History   Tobacco Use  . Smoking status: Never Smoker  . Smokeless tobacco: Never Used  Substance Use Topics  . Alcohol use: No    Alcohol/week: 0.0 standard drinks    Comment: rarely  . Drug use: No    Review of Systems Constitutional: No fever/chills Eyes: No visual changes. ENT: No sore throat. Cardiovascular: Denies chest pain. Respiratory: Denies shortness of breath. Gastrointestinal:  No abdominal pain.  No nausea, no vomiting.  No diarrhea.  No constipation. Genitourinary: Negative for dysuria. Musculoskeletal: Negative for neck pain.  Negative for back pain. Integumentary: Negative for rash. Neurological: Negative for headaches, focal weakness or numbness.  Positive for generalized weakness  ____________________________________________   PHYSICAL EXAM:  VITAL SIGNS: ED Triage Vitals  Enc Vitals Group     BP 01/01/19 0435 (!) 122/94     Pulse Rate 01/01/19 0435 (!) 110     Resp 01/01/19 0435 (!) 24     Temp 01/01/19 0435 98 F (36.7 C)     Temp Source 01/01/19 0435 Oral     SpO2 01/01/19 0435 94 %     Weight 01/01/19 0431 (!) 154.2 kg (340 lb)     Height 01/01/19 0431 1.676 m (_0 )     Head Circumference --      Peak Flow --      Pain Score 01/01/19 0431 0     Pain Loc --      Pain Edu? --      Excl. in [place]? --     Constitutional: Alert and oriented. Well appearing and in no acute distress. Eyes: Conjunctivae are normal.  Mouth/Throat: Mucous membranes are moist.  Oropharynx non-erythematous. Neck: No stridor.  Cardiovascular: Normal rate, regular rhythm. Good peripheral circulation. Grossly normal heart  sounds. Respiratory: Normal respiratory effort.  No retractions. Lungs CTAB. Gastrointestinal: Soft and nontender. No distention.  Musculoskeletal: No lower extremity tenderness nor edema. No gross deformities of extremities. Neurologic:  Normal speech and language. No gross focal neurologic deficits are appreciated.  Skin:  Skin is warm, dry and intact. No rash noted. Psychiatric: Mood and affect are normal. Speech and behavior are normal.  ____________________________________________   LABS (all labs ordered are listed, but only abnormal results are displayed)  Labs Reviewed  CBC - Abnormal; Notable for the following components:      Result Value   WBC 11.8 (*)    RBC 3.78 (*)    Hemoglobin 10.8 (*)    HCT 34.0 (*)    All other components within normal limits  URINALYSIS, COMPLETE (UACMP) WITH MICROSCOPIC - Abnormal; Notable for the following components:   Color, Urine AMBER (*)    APPearance TURBID (*)    Hgb urine dipstick LARGE (*)    Ketones, ur 5 (*)    Protein, ur 100 (*)    Leukocytes,Ua MODERATE (*)    RBC / HPF >50 (*)    WBC, UA >50 (*)    Bacteria, UA MANY (*)    All other components within normal limits  COMPREHENSIVE METABOLIC PANEL - Abnormal; Notable for the following components:   Potassium 2.8 (*)    Glucose, Bld 119 (*)    BUN 36 (*)    Creatinine, Ser 1.67 (*)    Calcium 8.6 (*)    Albumin 3.0 (*)    Total Bilirubin 2.0 (*)    GFR calc non Af Amer 30 (*)    GFR calc Af Amer 35 (*)    All other components within normal limits  PROTIME-INR - Abnormal; Notable for the following components:   Prothrombin Time >90.0 (*)    INR >10.0 (*)    All other components within normal limits  URINE CULTURE  CULTURE, BLOOD (ROUTINE X 2)  CULTURE, BLOOD (ROUTINE X 2)  CK  LACTIC ACID, PLASMA  LACTIC ACID, PLASMA  LACTIC ACID, PLASMA  LACTIC ACID, PLASMA   ____________________________________________  EKG  ED ECG REPORT I, [place] N Emiah Pellicano, the attending  physician, personally viewed and interpreted this ECG.   Date: 01/01/2019  EKG Time: 4:36 AM  Rate: 97  Rhythm: Atrial fibrillation  Axis: Normal  Intervals: Irregular RR interval  ST&T Change: Prolonged QTC  ____________________________________________  RADIOLOGY I, [place] N Yuki Purves, personally viewed and evaluated these images (plain radiographs) as part of my medical decision making, as well as reviewing the written report by the radiologist.  ED MD interpretation: No acute cardiopulmonary process noted on chest x-ray per radiologist.  Official radiology report(s): Dg Chest Portable 1 View  Result Date: 01/01/2019 CLINICAL DATA:  Dyspnea and weakness after diarrhea. EXAM: PORTABLE CHEST 1 VIEW COMPARISON:  10/10/2016 FINDINGS: Chronic interstitial coarsening. No air bronchogram or Kerley lines. Normal heart size and mediastinal contours. Mitral annular calcification. Osteopenia and generalized degenerative disease. IMPRESSION: No acute finding when compared to prior. Electronically Signed   By: Monte Fantasia M.D.   On: 01/01/2019 06:08      .Critical Care Performed by: Gregor Hams, MD Authorized by: Gregor Hams, MD   Critical care provider statement:    Critical care time (minutes):  45   Critical care time was exclusive of:  Separately billable procedures and treating other patients   Critical care was necessary to treat or prevent imminent or life-threatening deterioration of the following conditions:  Sepsis   Critical care was time spent personally by me on the following activities:  Development of treatment plan with patient or surrogate, discussions with consultants, evaluation of patient's response to treatment, examination of patient, obtaining history from patient or surrogate, ordering and performing treatments and interventions, ordering and review of laboratory studies, ordering and review of radiographic studies, pulse oximetry, re-evaluation of patient's  condition and review of old charts     ____________________________________________   [place] / MDM / [place] / ED COURSE  As part of my medical decision making, I reviewed the following data within the electronic MEDICAL RECORD NUMBER   73 year old female presenting with above-stated history and physical exam secondary to generalized weakness.  Malodorous urine noted raising concern for possible urinary tract infection.  Patient met sepsis criteria on arrival tachycardic tachypneic.  Laboratory data notable for elevated white blood cell count.  Patient discussed with Dr. Marcille Blanco for hospital admission further evaluation and management.     ____________________________________________  FINAL CLINICAL IMPRESSION(S) / ED DIAGNOSES  Final diagnoses:  Acute cystitis without hematuria  Sepsis, due to unspecified organism, unspecified whether acute organ dysfunction present [facility])     MEDICATIONS GIVEN DURING THIS VISIT:  Medications  cefTRIAXone (ROCEPHIN) 1 g in sodium chloride 0.9 % 100 mL IVPB (1 g Intravenous New Bag/Given 01/01/19 0618)  phytonadione (VITAMIN K) tablet 10 mg (has no administration in time range)  sodium chloride 0.9 % bolus 1,000 mL (1,000 mLs Intravenous New Bag/Given 01/01/19 0532)     ED Discharge Orders    None       Note:  This document was prepared using Dragon voice recognition software and may include unintentional dictation errors.   Gregor Hams, MD 01/02/19 [phone]

## 2019-01-01 NOTE — H&P (Signed)
Megan Obrien is an 73 y.o. female.   Chief Complaint: Weakness HPI: The patient with past medical history of stroke, retention, obstructive sleep apnea, diabetes and hyperlipidemia resents to the emergency department due to weakness.  The patient was so weak that she could not stand after sitting on the commode.  She reports that she had been sitting for approximately 4 hours.  The patient also reports that she has had a lot of bleeding from bruises all over her body.  She denies seeing blood in her urine or stool.  Laboratory evaluation in the emergency department revealed supratherapeutic INR as well as urinary tract infection which prompted the emergency department staff to call the hospitalist service for admission.  Past Medical History:  Diagnosis Date  . (HFpEF) heart failure with preserved ejection fraction ([place])    a. 03/2017 Echo: EF 55-60%, no rwma, Gr1 DD, mild to mod MS, mod to sev dil LA.  [name] Arthritis   . Carotid arterial disease ([place])    a. 11/2017 CTA Head/Neck: RICA 45, LICA 60.  [name] Cerebrovascular disease    a. 11/2017 MRA: mod to sev stenosis of bilat PCA's P2 segments; b. 11/2017 CTA Head/Neck: RICA 45, LICA 60, sev bilat distal MCA branch stenoses w/ sev bilat P2 stenoses.  . CKD (chronic kidney disease), stage III ([place])   . Depression   . GERD (gastroesophageal reflux disease)   . Hyperlipidemia   . Hypertension   . Left Atrial Appendage Thrombus    a. 11/2017 TEE (performed in setting of stroke/aphasia): nl EF, mod MS, dil LA w/ heavy smoke and small LAA thrombus-->Warfarin initiated.  . Mitral stenosis    a. Mild to moderate by echo 03/2017; b. 03/2017 R/LHC: PCWP 5mmHg, LVEDP 6mmHg. Mean grad of 74mmHg. Valve area of 2.02cm^2; c. 11/2017 TEE: Mod MS.  . Morbid obesity ([place])   . [place]   . Non-obstructive CAD (coronary artery disease)    a. 03/2017 Cath: mild, non-obstructive CAD.  [name] OSA (obstructive sleep apnea)    a. Previously wore CPAP for several years but stopped  on her own and now just uses O2 via [place] @ night.  [name] PAH (pulmonary artery hypertension) ([place])    a. 03/2017 RHC: PA 25mmHg.  . Seasonal allergies   . Stroke [facility])    a. 11/2017 - presented w/ aphasia->MRI acute to early subacute ischemia w/in multiple vascular territories, largest in R frontal & L parietal lobes. Smaller foci of ischemia w/in R hippocampus, R parietal lobe, and L peripheral postcentral gyrus.  . Type 2 diabetes mellitus ([place])   . Urinary incontinence     Past Surgical History:  Procedure Laterality Date  . BACK SURGERY    . CARDIAC CATHETERIZATION    . KNEE SURGERY Left   . RIGHT/LEFT HEART CATH AND CORONARY ANGIOGRAPHY Bilateral 04/25/2017   Procedure: Right/Left Heart Cath and Coronary Angiography;  Surgeon: Wellington Hampshire, MD;  Location: [place] CV LAB;  Service: Cardiovascular;  Laterality: Bilateral;  . TEE WITHOUT CARDIOVERSION N/A 12/20/2017   Procedure: TRANSESOPHAGEAL ECHOCARDIOGRAM (TEE);  Surgeon: Wellington Hampshire, MD;  Location: [facility] ORS;  Service: Cardiovascular;  Laterality: N/A;  . TONSILLECTOMY AND ADENOIDECTOMY  1951    Family History  Problem Relation Age of Onset  . Lung cancer Father   . Alzheimer's disease Mother   . Stroke Maternal Grandfather    Social History:  reports that she has never smoked. She has never used smokeless tobacco. She reports that she  does not drink alcohol or use drugs.  Allergies: No Known Allergies  (Not in a hospital admission)   Results for orders placed or performed during the hospital encounter of 01/01/19 (from the past 48 hour(s))  CBC     Status: Abnormal   Collection Time: 01/01/19  5:04 AM  Result Value Ref Range   WBC 11.8 (H) 4.0 - 10.5 K/uL   RBC 3.78 (L) 3.87 - 5.11 MIL/uL   Hemoglobin 10.8 (L) 12.0 - 15.0 g/dL   HCT 34.0 (L) 36.0 - 46.0 %   MCV 89.9 80.0 - 100.0 fL   MCH 28.6 26.0 - 34.0 pg   MCHC 31.8 30.0 - 36.0 g/dL   RDW 13.9 11.5 - 15.5 %   Platelets 323 150 - 400 K/uL   nRBC 0.0 0.0  - 0.2 %    Comment: Performed at [facility], [address] [place], [place] [address]  Urinalysis, Complete w Microscopic     Status: Abnormal   Collection Time: 01/01/19  5:04 AM  Result Value Ref Range   Color, Urine AMBER (A) YELLOW    Comment: BIOCHEMICALS MAY BE AFFECTED BY COLOR   APPearance TURBID (A) CLEAR   Specific Gravity, Urine 1.023 1.005 - 1.030   pH 6.0 5.0 - 8.0   Glucose, UA NEGATIVE NEGATIVE mg/dL   Hgb urine dipstick LARGE (A) NEGATIVE   Bilirubin Urine NEGATIVE NEGATIVE   Ketones, ur 5 (A) NEGATIVE mg/dL   Protein, ur 100 (A) NEGATIVE mg/dL   Nitrite NEGATIVE NEGATIVE   Leukocytes,Ua MODERATE (A) NEGATIVE   RBC / HPF >50 (H) 0 - 5 RBC/hpf   WBC, UA >50 (H) 0 - 5 WBC/hpf   Bacteria, UA MANY (A) NONE SEEN   Squamous Epithelial / LPF 6-10 0 - 5   WBC Clumps PRESENT     Comment: Performed at [facility], [address] [place], [place] [address]  Comprehensive metabolic panel     Status: Abnormal   Collection Time: 01/01/19  5:04 AM  Result Value Ref Range   Sodium 140 135 - 145 mmol/L   Potassium 2.8 (L) 3.5 - 5.1 mmol/L   Chloride 101 98 - 111 mmol/L   CO2 25 22 - 32 mmol/L   Glucose, Bld 119 (H) 70 - 99 mg/dL   BUN 36 (H) 8 - 23 mg/dL   Creatinine, Ser 1.67 (H) 0.44 - 1.00 mg/dL   Calcium 8.6 (L) 8.9 - 10.3 mg/dL   Total Protein 6.9 6.5 - 8.1 g/dL   Albumin 3.0 (L) 3.5 - 5.0 g/dL   AST 40 15 - 41 U/L   ALT 26 0 - 44 U/L   Alkaline Phosphatase 84 38 - 126 U/L   Total Bilirubin 2.0 (H) 0.3 - 1.2 mg/dL   GFR calc non Af Amer 30 (L) >60 mL/min   GFR calc Af Amer 35 (L) >60 mL/min   Anion gap 14 5 - 15    Comment: Performed at [facility], [address] [place], [place] [address]  Protime-INR     Status: Abnormal   Collection Time: 01/01/19  5:04 AM  Result Value Ref Range   Prothrombin Time >90.0 (H) 11.4 - 15.2 seconds   INR >10.0 (HH) 0.8 - 1.2    Comment: CRITICAL RESULT CALLED TO, READ BACK BY AND VERIFIED  WITH: APRIL BUMGUARD AT 0347 ON 01/01/2019 JJB (NOTE) INR goal varies based on device and disease states. Performed at [facility], [id number] [place], [place] [address]   CK     Status: None   Collection Time: 01/01/19  5:04 AM  Result Value Ref Range   Total CK 223 38 - 234 U/L    Comment: Performed at [facility], [address] [place], [place] [address]   Dg Chest Portable 1 View  Result Date: 01/01/2019 CLINICAL DATA:  Dyspnea and weakness after diarrhea. EXAM: PORTABLE CHEST 1 VIEW COMPARISON:  10/10/2016 FINDINGS: Chronic interstitial coarsening. No air bronchogram or Kerley lines. Normal heart size and mediastinal contours. Mitral annular calcification. Osteopenia and generalized degenerative disease. IMPRESSION: No acute finding when compared to prior. Electronically Signed   By: Monte Fantasia M.D.   On: 01/01/2019 06:08    Review of Systems  Constitutional: Negative for chills and fever.  HENT: Negative for sore throat and tinnitus.   Eyes: Negative for blurred vision and redness.  Respiratory: Negative for cough and shortness of breath.   Cardiovascular: Negative for chest pain, palpitations, orthopnea and PND.  Gastrointestinal: Negative for abdominal pain, diarrhea, nausea and vomiting.  Genitourinary: Negative for dysuria, frequency and urgency.  Musculoskeletal: Negative for joint pain and myalgias.  Skin: Negative for rash.       No lesions  Neurological: Positive for weakness. Negative for speech change and focal weakness.  Endo/Heme/Allergies: Does not bruise/bleed easily.       No temperature intolerance  Psychiatric/Behavioral: Negative for depression and suicidal ideas.    Blood pressure (!) 177/89, pulse 91, temperature 98 F (36.7 C), temperature source Oral, resp. rate 18, height 5\' 6"  (1.676 m), weight (!) 154.2 kg, SpO2 94 %. Physical Exam  Vitals reviewed. Constitutional: She is oriented to person, place, and time. She  appears well-developed and well-nourished. No distress.  HENT:  Head: Normocephalic and atraumatic.  Mouth/Throat: Oropharynx is clear and moist.  Eyes: Pupils are equal, round, and reactive to light. Conjunctivae and EOM are normal. No scleral icterus.  Neck: Normal range of motion. Neck supple. No JVD present. No tracheal deviation present. No thyromegaly present.  Cardiovascular: Normal rate, regular rhythm and normal heart sounds. Exam reveals no gallop and no friction rub.  No murmur heard. Respiratory: Effort normal and breath sounds normal.  GI: Soft. Bowel sounds are normal. She exhibits no distension. There is no abdominal tenderness.  Genitourinary:    Genitourinary Comments: Deferred   Musculoskeletal: Normal range of motion.        General: No edema.  Lymphadenopathy:    She has no cervical adenopathy.  Neurological: She is alert and oriented to person, place, and time. No cranial nerve deficit. She exhibits normal muscle tone.  Skin: Skin is warm and dry. No erythema.  Multiple ecchymotic lesions all over extremities  Psychiatric: She has a normal mood and affect. Her behavior is normal. Judgment and thought content normal.     Assessment/Plan This is a 73 year old female admitted for sepsis. 1.  Sepsis: The patient meets criteria via cytosis, tachycardia and tachypnea.  He is hemodynamically stable.  Follow blood cultures for growth and sensitivities.  Source appears to be urine. 2.  UTI: Present on admission; continue ceftriaxone. 3.  Diabetes mellitus type 2: Hold oral hypoglycemic agents.  Continue basal insulin therapy.  Sliding scale insulin while hospitalized. 4.  History of stroke: With associated atrial thrombus.  Currently INR is greater than 10.  Hold warfarin.  Consult pharmacy for dosing. 5.  DVT prophylaxis: SCDs 6.  GI prophylaxis: EPI per home regimen The  patient is a full code.  Time spent on admission orders and patient care approximately 45  minutes  Harrie Foreman, MD 01/01/2019, 6:36 AM

## 2019-01-01 NOTE — Telephone Encounter (Signed)
Returned call to the dtr and stated that she did not call back again. She stated this was earlier. Asked if she need anything and she stated her mom was being admitted for a few days and she may get to go home Saturday or Sunday. Advised that they will take care of her and that if the pt stays longer just call when she does get discharged. She verbalized understanding and confirmed the contact number.

## 2019-01-02 LAB — BASIC METABOLIC PANEL
Anion gap: 5 (ref 5–15)
BUN: 44 mg/dL — ABNORMAL HIGH (ref 8–23)
CO2: 27 mmol/L (ref 22–32)
Calcium: 7.9 mg/dL — ABNORMAL LOW (ref 8.9–10.3)
Chloride: 107 mmol/L (ref 98–111)
Creatinine, Ser: 2.11 mg/dL — ABNORMAL HIGH (ref 0.44–1.00)
GFR calc Af Amer: 26 mL/min — ABNORMAL LOW (ref >60)
GFR calc non Af Amer: 22 mL/min — ABNORMAL LOW (ref >60)
Glucose, Bld: 142 mg/dL — ABNORMAL HIGH (ref 70–99)
Potassium: 3.7 mmol/L (ref 3.5–5.1)
Sodium: 139 mmol/L (ref 135–145)

## 2019-01-02 LAB — MAGNESIUM: Magnesium: 1.6 mg/dL — ABNORMAL LOW (ref 1.7–2.4)

## 2019-01-02 LAB — CBC
HCT: 27.5 % — ABNORMAL LOW (ref 36.0–46.0)
Hemoglobin: 8.5 g/dL — ABNORMAL LOW (ref 12.0–15.0)
MCH: 29 pg (ref 26.0–34.0)
MCHC: 30.9 g/dL (ref 30.0–36.0)
MCV: 93.9 fL (ref 80.0–100.0)
Platelets: 248 10*3/uL (ref 150–400)
RBC: 2.93 MIL/uL — ABNORMAL LOW (ref 3.87–5.11)
RDW: 14.4 % (ref 11.5–15.5)
WBC: 7.5 10*3/uL (ref 4.0–10.5)
nRBC: 0 % (ref 0.0–0.2)

## 2019-01-02 LAB — PROTIME-INR
INR: 3.8 — ABNORMAL HIGH (ref 0.8–1.2)
Prothrombin Time: 36.8 seconds — ABNORMAL HIGH (ref 11.4–15.2)

## 2019-01-02 LAB — GLUCOSE, CAPILLARY
Glucose-Capillary: 134 mg/dL — ABNORMAL HIGH (ref 70–99)
Glucose-Capillary: 149 mg/dL — ABNORMAL HIGH (ref 70–99)
Glucose-Capillary: 202 mg/dL — ABNORMAL HIGH (ref 70–99)

## 2019-01-02 MED ORDER — SODIUM CHLORIDE 0.9 % IV SOLN
INTRAVENOUS | Status: DC
  Administered 2019-01-02 (×2): via INTRAVENOUS

## 2019-01-02 MED ORDER — MAGNESIUM SULFATE 2 GM/50ML IV SOLN
2.0000 g | Freq: Once | INTRAVENOUS | Status: AC
  Administered 2019-01-02: 09:00:00 2 g via INTRAVENOUS
  Filled 2019-01-02: qty 50

## 2019-01-02 MED ORDER — POTASSIUM CHLORIDE 10 MEQ/100ML IV SOLN: 10.0000 meq | INTRAVENOUS | Status: DC

## 2019-01-02 NOTE — Consult Note (Signed)
ANTICOAGULATION CONSULT NOTE - Initial Consult  Pharmacy Consult for Warfarin Dosing and Monitoring  Indication: atrial fibrillation  No Known Allergies  Patient Measurements: Height: 5\' 6"  (167.6 cm) Weight: (!) 314 lb (142.4 kg) IBW/kg (Calculated) : 59.3  Vital Signs: Temp: 98.1 F (36.7 C) (04/03 0526) Temp Source: Oral (04/03 0526) BP: 145/70 (04/03 0526) Pulse Rate: 59 (04/03 0526)  Labs: Recent Labs    01/01/19 0504 01/02/19 0437  HGB 10.8* 8.5*  HCT 34.0* 27.5*  PLT 323 248  LABPROT >90.0* 36.8*  INR >10.0* 3.8*  CREATININE 1.67*  --   CKTOTAL 223  --     Estimated Creatinine Clearance: 43.2 mL/min (A) (by C-G formula based on SCr of 1.67 mg/dL (H)).   Medical History: Past Medical History:  Diagnosis Date  . (HFpEF) heart failure with preserved ejection fraction ([place])    a. 03/2017 Echo: EF 55-60%, no rwma, Gr1 DD, mild to mod MS, mod to sev dil LA.  [name] Arthritis   . Carotid arterial disease ([facility])    a. 11/2017 CTA Head/Neck: RICA 45, LICA 60.  [name] Cerebrovascular disease    a. 11/2017 MRA: mod to sev stenosis of bilat PCA's P2 segments; b. 11/2017 CTA Head/Neck: RICA 45, LICA 60, sev bilat distal MCA branch stenoses w/ sev bilat P2 stenoses.  . CKD (chronic kidney disease), stage III ([place])   . Depression   . GERD (gastroesophageal reflux disease)   . Hyperlipidemia   . Hypertension   . Left Atrial Appendage Thrombus    a. 11/2017 TEE (performed in setting of stroke/aphasia): nl EF, mod MS, dil LA w/ heavy smoke and small LAA thrombus-->Warfarin initiated.  . Mitral stenosis    a. Mild to moderate by echo 03/2017; b. 03/2017 R/LHC: PCWP 59mmHg, LVEDP 1mmHg. Mean grad of 60mmHg. Valve area of 2.02cm^2; c. 11/2017 TEE: Mod MS.  . Morbid obesity ([place])   . Murmur   . Non-obstructive CAD (coronary artery disease)    a. 03/2017 Cath: mild, non-obstructive CAD.  [name] OSA (obstructive sleep apnea)    a. Previously wore CPAP for several years but stopped on her own and  now just uses O2 via [place] @ night.  [name] PAH (pulmonary artery hypertension) ([place])    a. 03/2017 RHC: PA 60mmHg.  . Seasonal allergies   . Stroke [facility])    a. 11/2017 - presented w/ aphasia->MRI acute to early subacute ischemia w/in multiple vascular territories, largest in R frontal & L parietal lobes. Smaller foci of ischemia w/in R hippocampus, R parietal lobe, and L peripheral postcentral gyrus.  . Type 2 diabetes mellitus ([place])   . Urinary incontinence    Assessment: Pharmacy consulted for warfarin dosing and monitoring for a 73 yo female w/PMH of A.fib.   Patient was taking warfarin 5mg  PTA.  INR on admission was >10. Patient received Vitamin K 10mg  x 1 dose.   DATE INR DOSE   HGB 4/2 >10 Held        10.8 4/3         3.8     Held         8.5  Goal of Therapy:  INR 2-3 Monitor platelets by anticoagulation protocol: Yes   Plan:  4/3 Warfarin will be held today.   INR/CBC ordered with AM labs.   Pharmacy will continue to follow and order warfarin when INR in therapeutic range.   Will monitor CBC at least every 3 days per protocol.   Lu Duffel,  PharmD, BCPS Clinical Pharmacist 01/02/2019 7:13 AM

## 2019-01-02 NOTE — Progress Notes (Signed)
[facility] NAME: Megan Obrien    MR#:  [id number]  DATE OF BIRTH:  October 17, 1945  SUBJECTIVE:  patient feels a lot better. No fever. Eating a little better.  REVIEW OF SYSTEMS:   Review of Systems  Constitutional: Negative for chills, fever and weight loss.  HENT: Negative for ear discharge, ear pain and nosebleeds.   Eyes: Negative for blurred vision, pain and discharge.  Respiratory: Negative for sputum production, shortness of breath, wheezing and stridor.   Cardiovascular: Negative for chest pain, palpitations, orthopnea and PND.  Gastrointestinal: Negative for abdominal pain, diarrhea, nausea and vomiting.  Genitourinary: Negative for frequency and urgency.  Musculoskeletal: Positive for joint pain. Negative for back pain.  Neurological: Positive for weakness. Negative for sensory change, speech change and focal weakness.  Psychiatric/Behavioral: Negative for depression and hallucinations. The patient is not nervous/anxious.    Tolerating Diet:yesTolerating PT: yes  DRUG ALLERGIES:  No Known Allergies  VITALS:  Blood pressure (!) 155/79, pulse 70, temperature (!) 97.5 F (36.4 C), temperature source Oral, resp. rate 17, height 5\' 6"  (1.676 m), weight (!) 142.4 kg, SpO2 100 %.  PHYSICAL EXAMINATION:   Physical Exam  GENERAL:  73 y.o.-year-old patient lying in the bed with no acute distress. obese EYES: Pupils equal, round, reactive to light and accommodation. No scleral icterus. Extraocular muscles intact.  HEENT: Head atraumatic, normocephalic. Oropharynx and nasopharynx clear.  NECK:  Supple, no jugular venous distention. No thyroid enlargement, no tenderness.  LUNGS: Normal breath sounds bilaterally, no wheezing, rales, rhonchi. No use of accessory muscles of respiration.  CARDIOVASCULAR: S1, S2 normal. No murmurs, rubs, or gallops.  ABDOMEN: Soft, nontender, nondistended. Bowel sounds present. No  organomegaly or mass.  EXTREMITIES: No cyanosis, clubbing or edema b/l.    NEUROLOGIC: Cranial nerves II through XII are intact. No focal Motor or sensory deficits b/l.   PSYCHIATRIC:  patient is alert and oriented x 3.  SKIN: skin tears which are wrapped with banded and bruising  LABORATORY PANEL:  CBC Recent Labs  Lab 01/02/19 0437  WBC 7.5  HGB 8.5*  HCT 27.5*  PLT 248    Chemistries  Recent Labs  Lab 01/01/19 0504 01/02/19 0437  NA 140 139  K 2.8* 3.7  CL 101 107  CO2 25 27  GLUCOSE 119* 142*  BUN 36* 44*  CREATININE 1.67* 2.11*  CALCIUM 8.6* 7.9*  MG  --  1.6*  AST 40  --   ALT 26  --   ALKPHOS 84  --   BILITOT 2.0*  --    Cardiac Enzymes No results for input(s): TROPONINI in the last 168 hours. RADIOLOGY:  Dg Chest Portable 1 View  Result Date: 01/01/2019 CLINICAL DATA:  Dyspnea and weakness after diarrhea. EXAM: PORTABLE CHEST 1 VIEW COMPARISON:  10/10/2016 FINDINGS: Chronic interstitial coarsening. No air bronchogram or Kerley lines. Normal heart size and mediastinal contours. Mitral annular calcification. Osteopenia and generalized degenerative disease. IMPRESSION: No acute finding when compared to prior. Electronically Signed   By: Monte Fantasia M.D.   On: 01/01/2019 06:08   ASSESSMENT AND PLAN:  72 year old female admitted for sepsis.  1.  Sepsis: The patient meets criteria via cytosis, tachycardia and tachypnea.  sHe is hemodynamically stable.   -UC ecoli -BC negative   Source appears to be urine.  2.UTI: Present on admission; continue ceftriaxone.  3. Diabetes mellitus type 2: Hold oral hypoglycemic agents.  Continue basal insulin therapy.  Sliding  scale insulin while hospitalized.  4. History of stroke: With associated atrial thrombus.  Currently INR is greater than 10.  Hold warfarin.  Consult pharmacy for dosing. -INR 3.8  5.  DVT prophylaxis: SCDs  6. Acute on CKDIII Baseline creat 1.5-1.7 -cont IVF and po fluid intake  Left message  for dter CM for d/c planning HHPt vs STR  Case discussed with Care Management/Social Worker. Management plans discussed with the patient, family and they are in agreement.  CODE STATUS: FULL DVT Prophylaxis: Warfarin  TOTAL TIME TAKING CARE OF THIS PATIENT: *30* minutes.  >50% time spent on counselling and coordination of care  POSSIBLE D/C IN *1-2* DAYS, DEPENDING ON CLINICAL CONDITION.  Note: This dictation was prepared with Dragon dictation along with smaller phrase technology. Any transcriptional errors that result from this process are unintentional.  Fritzi Mandes M.D on 01/02/2019 at 12:11 PM  Between 7am to 6pm - Pager - [phone]  After 6pm go to [email or web address] - password [email or web address] [facility]  Office  [phone]  CC: Primary care physician; Pleas Koch, NPPatient ID: Megan Obrien, female   DOB: 03-May-1946, 73 y.o.   MRN: [id number]

## 2019-01-02 NOTE — TOC Initial Note (Signed)
Transition of Care [facility]) - Initial/Assessment Note    Patient Details  Name: Megan Obrien MRN: [id number] Date of Birth: 1945/10/15  Transition of Care [facility]) CM/SW Contact:    Annamaria Boots, [place] Phone Number: 01/02/2019, 3:18 PM  Clinical Narrative:  Patient being recommended for SNF. CSW spoke with patient regarding discharge plan. Patient reports that she lives alone and has some support from family. CSW explained that PT has recommended SNF. Patient states that she does not want to go to SNF. Patient reports that she has been to a SNF in the past but could not remember the name of the facility. Patient reports that she would prefer to go home and does not want to go to SNF. She did agree to SNF bed search in case she changes her mind but still wants to return home. Per patient, she has someone that can come and help her at home when she discharges. TOC will continue to follow for discharge planning.                  Expected Discharge Plan: Skilled Nursing Facility Barriers to Discharge: Continued Medical Work up   Patient Goals and CMS Choice Patient states their goals for this hospitalization and ongoing recovery are:: " I want to go home"  CMS Medicare.gov Compare Post Acute Care list provided to:: Patient    Expected Discharge Plan and Services Expected Discharge Plan: [place]       Living arrangements for the past 2 months: Single Family Home                          Prior Living Arrangements/Services Living arrangements for the past 2 months: Single Family Home Lives with:: Self Patient language and need for interpreter reviewed:: Yes Do you feel safe going back to the place where you live?: Yes      Need for Family Participation in Patient Care: Yes (Comment) Care giver support system in place?: Yes (comment)   Criminal Activity/Legal Involvement Pertinent to Current Situation/Hospitalization: No - Comment as needed  Activities of Daily  Living Home Assistive Devices/Equipment: None ADL Screening (condition at time of admission) Patient's cognitive ability adequate to safely complete daily activities?: Yes Is the patient deaf or have difficulty hearing?: No Does the patient have difficulty seeing, even when wearing glasses/contacts?: No Does the patient have difficulty concentrating, remembering, or making decisions?: No Patient able to express need for assistance with ADLs?: Yes Does the patient have difficulty dressing or bathing?: No Independently performs ADLs?: Yes (appropriate for developmental age) Does the patient have difficulty walking or climbing stairs?: Yes Weakness of Legs: Right Weakness of Arms/Hands: None  Permission Sought/Granted Permission sought to share information with : Case Manager, Customer service manager, Family Supports Permission granted to share information with : Yes, Verbal Permission Granted  Share Information with NAME: Daughter  Permission granted to share info w AGENCY: Home Health and SNF         Emotional Assessment Appearance:: Appears stated age Attitude/Demeanor/Rapport: Engaged Affect (typically observed): Calm, Appropriate Orientation: : Oriented to Self, Oriented to Place, Oriented to  Time Alcohol / Substance Use: Not Applicable Psych Involvement: No (comment)  Admission diagnosis:  Acute cystitis without hematuria [N30.00] Sepsis, due to unspecified organism, unspecified whether acute organ dysfunction present [facility]) [A41.9] Patient Active Problem List   Diagnosis Date Noted  . Sepsis ([place]) 01/01/2019  . History of medication noncompliance 05/26/2018  . Encounter  for therapeutic drug monitoring 04/15/2018  . Vaginal bleeding 02/06/2018  . Expressive aphasia 12/18/2017  . Stroke ([place]) 12/18/2017  . Mitral valve disease   . Exertional dyspnea 04/17/2017  . Fatigue 02/20/2017  . 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 Medicare annual wellness visit, subsequent 07/24/2016  .  Hyperlipidemia 02/06/2016  . Essential hypertension 02/06/2016  . Type 2 diabetes mellitus with complication, without long-term current use of insulin ([place]) 02/06/2016  . Depression 02/06/2016  . GERD (gastroesophageal reflux disease) 02/06/2016  . Urge incontinence of urine 02/06/2016   PCP:  Pleas Koch, NP Pharmacy:   [facility] [address], [place] -  [place] [address] [place] [address] Phone: [phone] Fax: [phone]     Social Determinants of Health (SDOH) Interventions    Readmission Risk Interventions No flowsheet data found.

## 2019-01-02 NOTE — Consult Note (Signed)
PHARMACY CONSULT NOTE - FOLLOW UP  Pharmacy Consult for Electrolyte Monitoring and Replacement   Recent Labs: Potassium (mmol/L)  Date Value  01/02/2019 3.7   Magnesium (mg/dL)  Date Value  01/02/2019 1.6 (L)   Calcium (mg/dL)  Date Value  01/02/2019 7.9 (L)   Albumin (g/dL)  Date Value  01/01/2019 3.0 (L)   Sodium (mmol/L)  Date Value  01/02/2019 139  11/24/2018 142     Assessment: Pharmacy has been consulted to monitor/replenish electrolytes in this 73 yo female with hx of anticoagulation related bruising/weakness  Goal of Therapy:  Electrolytes WNL's  Plan:  K=3.7  Continue NS with 6meq KCl running at 173ml/hr - will recheck K with am labs.  Mg = 1.6  Will give IV bolus Mg 2g x 1 and recheck Mg with am labs   Lu Duffel, PharmD, BCPS Clinical Pharmacist 01/02/2019 9:04 AM

## 2019-01-02 NOTE — NC FL2 (Signed)
[place] LEVEL OF CARE SCREENING TOOL     IDENTIFICATION  Patient Name: Megan Obrien Birthdate: Aug 15, 1946 Sex: female Admission Date (Current Location): 01/01/2019  [address] and [place] Number:  Engineering geologist and Address:  [facility], [address] [place], [place] [address]      Provider Number: [id number]  Attending Physician Name and Address:  Fritzi Mandes, MD  Relative Name and Phone Number:       Current Level of Care: Hospital Recommended Level of Care: [place] Prior Approval Number:    Date Approved/Denied:   PASRR Number: [id number]  Discharge Plan: SNF    Current Diagnoses: Patient Active Problem List   Diagnosis Date Noted  . Sepsis ([place]) 01/01/2019  . History of medication noncompliance 05/26/2018  . Encounter for therapeutic drug monitoring 04/15/2018  . Vaginal bleeding 02/06/2018  . Expressive aphasia 12/18/2017  . Stroke ([place]) 12/18/2017  . Mitral valve disease   . Exertional dyspnea 04/17/2017  . Fatigue 02/20/2017  . 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 Medicare annual wellness visit, subsequent 07/24/2016  . Hyperlipidemia 02/06/2016  . Essential hypertension 02/06/2016  . Type 2 diabetes mellitus with complication, without long-term current use of insulin ([place]) 02/06/2016  . Depression 02/06/2016  . GERD (gastroesophageal reflux disease) 02/06/2016  . Urge incontinence of urine 02/06/2016    Orientation RESPIRATION BLADDER Height & Weight     Self, Place, Time  O2(2 liters ) Incontinent Weight: (!) 314 lb (142.4 kg) Height:  5\' 6"  (167.6 cm)  BEHAVIORAL SYMPTOMS/MOOD NEUROLOGICAL BOWEL NUTRITION STATUS  (none) (none) Continent Diet(Heart Healthy )  AMBULATORY STATUS COMMUNICATION OF NEEDS Skin   Extensive Assist Verbally Normal                       Personal Care Assistance Level of Assistance  Bathing, Feeding, Dressing Bathing Assistance: Limited assistance Feeding  assistance: Independent Dressing Assistance: Limited assistance     Functional Limitations Info  Sight, Hearing, Speech Sight Info: Adequate Hearing Info: Adequate Speech Info: Adequate    SPECIAL CARE FACTORS FREQUENCY  PT (By licensed PT), OT (By licensed OT)     PT Frequency: 5 OT Frequency: 5            Contractures Contractures Info: Not present    Additional Factors Info  Code Status, Allergies Code Status Info: Full Code  Allergies Info: NKA           Current Medications (01/02/2019):  This is the current hospital active medication list Current Facility-Administered Medications  Medication Dose Route Frequency Provider Last Rate Last Dose  . 0.9 %  sodium chloride infusion   Intravenous Continuous Fritzi Mandes, MD 75 mL/hr at 01/02/19 [phone]    . acetaminophen (TYLENOL) tablet 650 mg  650 mg Oral Q6H PRN Harrie Foreman, MD   650 mg at 01/02/19 0011   Or  . acetaminophen (TYLENOL) suppository 650 mg  650 mg Rectal Q6H PRN Harrie Foreman, MD      . atorvastatin (LIPITOR) tablet 40 mg  40 mg Oral Johnna Acosta, MD   40 mg at 01/01/19 2119  . carvedilol (COREG) tablet 18.75 mg  18.75 mg Oral BID Fritzi Mandes, MD   18.75 mg at 01/02/19 0849  . cefTRIAXone (ROCEPHIN) 1 g in sodium chloride 0.9 % 100 mL IVPB  1 g Intravenous Q24H Harrie Foreman, MD 200 mL/hr at 01/02/19 0600 1 g at 01/02/19 0600  .  docusate sodium (COLACE) capsule 100 mg  100 mg Oral BID Harrie Foreman, MD   100 mg at 01/02/19 0849  . FLUoxetine (PROZAC) capsule 40 mg  40 mg Oral Daily Fritzi Mandes, MD   40 mg at 01/02/19 0850  . insulin aspart (novoLOG) injection 0-20 Units  0-20 Units Subcutaneous TID WC Harrie Foreman, MD   3 Units at 01/02/19 1237  . insulin glargine (LANTUS) injection 30 Units  30 Units Subcutaneous Daily Fritzi Mandes, MD   30 Units at 01/02/19 0848  . lisinopril (PRINIVIL,ZESTRIL) tablet 40 mg  40 mg Oral Daily Fritzi Mandes, MD   40 mg at 01/02/19 0848  . omega-3 acid  ethyl esters (LOVAZA) capsule 1 g  1 g Oral Daily Fritzi Mandes, MD   1 g at 01/02/19 0850  . ondansetron (ZOFRAN) tablet 4 mg  4 mg Oral Q6H PRN Harrie Foreman, MD       Or  . ondansetron [facility]) injection 4 mg  4 mg Intravenous Q6H PRN Harrie Foreman, MD      . Warfarin - Pharmacist Dosing Inpatient   Does not apply q1800 Pernell Dupre, [facility]         Discharge Medications: Please see discharge summary for a list of discharge medications.  Relevant Imaging Results:  Relevant Lab Results:   Additional Information SSN: [id number]  Annamaria Boots, [place]

## 2019-01-02 NOTE — Evaluation (Signed)
Physical Therapy Evaluation Patient Details Name: Megan Obrien MRN: [id number] DOB: 03-11-1946 Today's Date: 01/02/2019   History of Present Illness  Patient is a 73 year old female who presented to the hospital with weakness and diarrhea after she spent over 4 hours on the the toilet after using it durin the night and finding she was unable to get up. She was admitted with diganosis of sepsis and UTI and supratherapeutic INR of 10 causing multiple bruses over her body. Relevant PMI include dizziness with mobility, DMII, hx of stroke with associated atrial thrombus (residual mild expressive aphasia per pt), heart failure, CAD, cerebrovascular disease, CKD, depression, morbid obesity, mitral stenosis, hypertension, murumr, PAH, stroke,  DMII, urinary incontinence. back surgery, cardiac catheterization, L knee surgery, right knee pain. Her chest radiograph was negative for any acute changes. She reports her right knee is "bad."    Clinical Impression  Prior to hospital admission, patient ambulated short household distances with RW, was I with ADLs, and required assistance with IADLs. Upon physical therapy evaluation, pt completed supine to sit with supervision and sit <> stand transfer elevated bed to chair using RW and Mod A +2 standing at each side with heavy cuing for hand, body, and AD placement. Patient complained of dizziness with functional mobility. Orthostatic vitals were taken in supine (BP 149/77 mmHg, HR 77bpm)  and sitting (BP149/78 mmHg, HR 84bpm), but not obtained in standing due to attention to transfer assist. Orthostatic changes do not explain dizziness at this time based on these findings. Pt reported decrease in dizziness upon rest. O2 sat remained WFL throughout session with 2L/min O2 applied. Patient failed to use proper positioning of body and AD during transfer without cuing and fatigued quickly. She appears to have experienced a significant decline in functional independence and  would benefit from rehab to address deficits.     Follow Up Recommendations SNF    Equipment Recommendations  3in1 (PT)    Recommendations for Other Services OT consult     Precautions / Restrictions Precautions Precautions: Fall Precaution Comments: reports at least 2 falls in last 6 months Restrictions Weight Bearing Restrictions: No      Mobility  Bed Mobility Overal bed mobility: Needs Assistance Bed Mobility: Supine to Sit     Supine to sit: Supervision     General bed mobility comments: required cuing for bed mobility technique and encouragement to stay on task and for  continued effort. Able with difficulty.   Transfers Overall transfer level: Needs assistance Equipment used: Rolling walker (2 wheeled) Transfers: Sit to/from Stand Sit to Stand: Mod assist;+2 physical assistance         General transfer comment: elevated bed to chair sit <> stand with RW and mod A +2 supporting at each side with strong cuing for stand and steps. Fatigued quickly and requeste to sit. Req. cuin to back up safely. attempted sit <> stand transfer from edge of elevated bed x 3 without success. Pt failed to properly place hands on RW or extend knees and hips during attmepts.  Ambulation/Gait Ambulation/Gait assistance: (unable at this time. At risk for knees buckling.)              Stairs            Wheelchair Mobility    Modified Rankin (Stroke Patients Only)       Balance Overall balance assessment: Needs assistance Sitting-balance support: Single extremity supported Sitting balance-Leahy Scale: Good Sitting balance - Comments: unable to take both  arms off mat   Standing balance support: Bilateral upper extremity supported Standing balance-Leahy Scale: Poor                               Pertinent Vitals/Pain Pain Assessment: 0-10 Pain Score: (unable to provide numeric rating) Pain Location: R knee only when moving Pain Intervention(s):  Limited activity within patient's tolerance;Monitored during session;Repositioned    Home Living Family/patient expects to be discharged to:: Private residence Living Arrangements: Alone Available Help at Discharge: Family;Personal care attendant;Available PRN/intermittently Type of Home: Apartment Home Access: Elevator     Home Layout: One level Home Equipment: Cane - quad;Walker - 2 wheels;Grab bars - tub/shower;Shower seat;Other (comment)(lift chair)      Prior Function Level of Independence: Needs assistance   Gait / Transfers Assistance Needed: ambulates in apartement with RW, bed to bathroom.   ADL's / Homemaking Assistance Needed: I with ADLs, requires help with IADLs.         Hand Dominance   Dominant Hand: Right    Extremity/Trunk Assessment   Upper Extremity Assessment Upper Extremity Assessment: Generalized weakness    Lower Extremity Assessment Lower Extremity Assessment: Generalized weakness    Cervical / Trunk Assessment Cervical / Trunk Assessment: Other exceptions Cervical / Trunk Exceptions: slumpted posture, mildy stooped when standing  Communication   Communication: Expressive difficulties(mild, pt noted she feels this. Able to communicate everything she wanted with additional time)  Cognition Arousal/Alertness: Awake/alert Behavior During Therapy: WFL for tasks assessed/performed Overall Cognitive Status: Within Functional Limits for tasks assessed                                        General Comments      Exercises Other Exercises Other Exercises: sit <> stand attempt from edge of elevated bed to RW x 3 trials with mod A cuing and education for proper body mechanics and walker use, did not provide max A due to risk of bruising. sit <> stand transfer bed to chair with RW mod A +2 at each side and cuing and education on proper positioning of body and AD.    Assessment/Plan    PT Assessment Patient needs continued PT  services  PT Problem List Decreased strength;Decreased mobility;Decreased activity tolerance;Cardiopulmonary status limiting activity;Decreased balance;Pain;Obesity       PT Treatment Interventions DME instruction;Functional mobility training;Balance training;Patient/family education;Gait training;Therapeutic activities;Neuromuscular re-education;Stair training;Therapeutic exercise    PT Goals (Current goals can be found in the Care Plan section)  Acute Rehab PT Goals Patient Stated Goal: get stronger and return home PT Goal Formulation: With patient Time For Goal Achievement: 01/16/19 Potential to Achieve Goals: Fair    Frequency Min 2X/week   Barriers to discharge Decreased caregiver support;Other (comment)(decline in physical mobility functional strength) caregiver support intermittant, decline in phyiscal mobility functional strength    Co-evaluation               AM-PAC PT "6 Clicks" Mobility  Outcome Measure Help needed turning from your back to your side while in a flat bed without using bedrails?: A Little Help needed moving from lying on your back to sitting on the side of a flat bed without using bedrails?: A Little Help needed moving to and from a bed to a chair (including a wheelchair)?: A Lot Help needed standing up from a chair using your  arms (e.g., wheelchair or bedside chair)?: A Lot Help needed to walk in hospital room?: Total Help needed climbing 3-5 steps with a railing? : Total 6 Click Score: 12    End of Session Equipment Utilized During Treatment: Gait belt Activity Tolerance: Patient tolerated treatment well;Patient limited by fatigue;Patient limited by pain Patient left: in chair;with call bell/phone within reach;with chair alarm set;with SCD's reapplied Nurse Communication: Mobility status;Other (comment)(results of session) PT Visit Diagnosis: Unsteadiness on feet (R26.81);Muscle weakness (generalized) (M62.81);History of falling  (Z91.81);Difficulty in walking, not elsewhere classified (R26.2)    Time: 0005-0567 PT Time Calculation (min) (ACUTE ONLY): 57 min   Charges:   PT Evaluation $PT Eval Moderate Complexity: 1 Mod PT Treatments $Therapeutic Activity: 23-37 mins       Everlean Alstrom. Graylon Good, PT, DPT 01/02/19, 11:54 AM

## 2019-01-03 ENCOUNTER — Inpatient Hospital Stay: Payer: Medicare Other

## 2019-01-03 LAB — MAGNESIUM: Magnesium: 1.9 mg/dL (ref 1.7–2.4)

## 2019-01-03 LAB — GLUCOSE, CAPILLARY
Glucose-Capillary: 118 mg/dL — ABNORMAL HIGH (ref 70–99)
Glucose-Capillary: 143 mg/dL — ABNORMAL HIGH (ref 70–99)
Glucose-Capillary: 159 mg/dL — ABNORMAL HIGH (ref 70–99)
Glucose-Capillary: 151 mg/dL — ABNORMAL HIGH (ref 70–99)
Glucose-Capillary: 152 mg/dL — ABNORMAL HIGH (ref 70–99)
Glucose-Capillary: 136 mg/dL — ABNORMAL HIGH (ref 70–99)

## 2019-01-03 LAB — CBC
HCT: 25.9 % — ABNORMAL LOW (ref 36.0–46.0)
Hemoglobin: 7.7 g/dL — ABNORMAL LOW (ref 12.0–15.0)
MCH: 29.1 pg (ref 26.0–34.0)
MCHC: 29.7 g/dL — ABNORMAL LOW (ref 30.0–36.0)
MCV: 97.7 fL (ref 80.0–100.0)
Platelets: 226 10*3/uL (ref 150–400)
RBC: 2.65 MIL/uL — ABNORMAL LOW (ref 3.87–5.11)
RDW: 14.6 % (ref 11.5–15.5)
WBC: 7.7 10*3/uL (ref 4.0–10.5)
nRBC: 0 % (ref 0.0–0.2)

## 2019-01-03 LAB — BASIC METABOLIC PANEL
Anion gap: 7 (ref 5–15)
BUN: 46 mg/dL — ABNORMAL HIGH (ref 8–23)
CO2: 25 mmol/L (ref 22–32)
Calcium: 7.8 mg/dL — ABNORMAL LOW (ref 8.9–10.3)
Chloride: 107 mmol/L (ref 98–111)
Creatinine, Ser: 2.02 mg/dL — ABNORMAL HIGH (ref 0.44–1.00)
GFR calc Af Amer: 27 mL/min — ABNORMAL LOW (ref >60)
GFR calc non Af Amer: 24 mL/min — ABNORMAL LOW (ref >60)
Glucose, Bld: 148 mg/dL — ABNORMAL HIGH (ref 70–99)
Potassium: 3.8 mmol/L (ref 3.5–5.1)
Sodium: 139 mmol/L (ref 135–145)

## 2019-01-03 LAB — URINE CULTURE: Culture: >=100000 — AB

## 2019-01-03 LAB — PROTIME-INR
INR: 3.5 — ABNORMAL HIGH (ref 0.8–1.2)
Prothrombin Time: 34.3 seconds — ABNORMAL HIGH (ref 11.4–15.2)

## 2019-01-03 MED ORDER — CEPHALEXIN 500 MG PO CAPS
500.0000 mg | ORAL_CAPSULE | Freq: Two times a day (BID) | ORAL | Status: DC
  Administered 2019-01-03 – 2019-01-05 (×5): 500 mg via ORAL
  Filled 2019-01-03 (×5): qty 1

## 2019-01-03 NOTE — Plan of Care (Signed)

## 2019-01-03 NOTE — TOC Progression Note (Signed)
Transition of Care [facility]) - Progression Note    Patient Details  Name: Megan Obrien MRN: [id number] Date of Birth: 1946-04-12  Transition of Care [facility]) CM/SW [place], [place] Phone Number: 01/03/2019, 1:18 PM  Clinical Narrative:   The CSW spoke with the patient's daughter about discharge planning. After multiple discussions, the patient has decided to pursue SNF placement at [facility]. The CSW contacted [facility]til[facility] Klu[facility] at [facility] who has confirmed that they can accept the patient as early as tomorrow. The CSW has updated the medical team and family who are all in agreement with this plan. The family and patient are aware of the COVID-19 precautions that preclude any visitation. The patient will be transported via EMS to the facility tomorrow if medically stable. TOC team is following.     Expected Discharge Plan: [place] Barriers to Discharge: Continued Medical Work up  Expected Discharge Plan and Services Expected Discharge Plan: [place] arrangements for the past 2 months: Single Family Home                           Social Determinants of Health (SDOH) Interventions    Readmission Risk Interventions No flowsheet data found.

## 2019-01-03 NOTE — Progress Notes (Addendum)
Physical Therapy Treatment Patient Details Name: Megan Obrien MRN: [id number] DOB: 12-27-1945 Today's Date: 01/03/2019    History of Present Illness Patient is a 73 year old female who presented to the hospital with weakness and diarrhea after she spent over 4 hours on the the toilet after using it durin the night and finding she was unable to get up. She was admitted with diganosis of sepsis and UTI and supratherapeutic INR of 10 causing multiple bruses over her body. Relevant PMI include dizziness with mobility, DMII, hx of stroke with associated atrial thrombus (residual mild expressive aphasia per pt), heart failure, CAD, cerebrovascular disease, CKD, depression, morbid obesity, mitral stenosis, hypertension, murumr, PAH, stroke,  DMII, urinary incontinence. back surgery, cardiac catheterization, L knee surgery, right knee pain. Her chest radiograph was negative for any acute changes. She reports her right knee is "bad."    PT Comments    Patient tolerated treatment well and is making gradual progress towards goals. She was sleeping at start of session but awoke and agreed to participate in physical therapy. Pt performed sit <> stand transfers x2 trials using bariatric RW from elevated bed to [facility] 90 with degree turn and BSC to chair with 180 degree turn, mod A +2 support at each side. Failed to stand up stright, stoops when putting weight on right LE, poor hand placement that required manual assistance from therapist. Demo poor problem solving skills and uses unsafe compensations for feelings of fatigue and pain, stooping over RW and grabbing legs and sides of handles. Corrected with cuing and encouragement.  Did stand at edge of [facility] for 2 min while nursing performed pericare. Pt's vitals stayed [facility] during session on 2L/min O2 supplementation.    Follow Up Recommendations  SNF     Equipment Recommendations  3in1 (PT)    Recommendations for Other Services OT consult     Precautions /  Restrictions Precautions Precautions: Fall Precaution Comments: reports at least 2 falls in last 6 months Restrictions Weight Bearing Restrictions: No    Mobility  Bed Mobility Overal bed mobility: Needs Assistance Bed Mobility: Supine to Sit     Supine to sit: Min assist(unable to get blankets off feet to move)     General bed mobility comments: required cuing for bed mobility technique and encouragement to stay on task and for continued effort. Rquired assistance with blankets  Transfers Overall transfer level: Needs assistance Equipment used: Rolling walker (2 wheeled)(bariatric) Transfers: Sit to/from Stand Sit to Stand: Mod assist;+2 physical assistance         General transfer comment: elevated bed to BSC 90 with degree turn and BSC to chair with 180 degree turn, mod A +2 support at each side. Failed to stand up stright, stoops when putting weight on right LE, poor hand placement that required manual assistance for therapists. Did stand at edge of [facility] for 2 min while nursing performed pericare.   Ambulation/Gait Ambulation/Gait assistance: (unable more than a few steps at this time. At risk for knees buckling.)               Stairs             Wheelchair Mobility    Modified Rankin (Stroke Patients Only)       Balance Overall balance assessment: Needs assistance Sitting-balance support: No upper extremity supported Sitting balance-Leahy Scale: Good     Standing balance support: Bilateral upper extremity supported Standing balance-Leahy Scale: Poor  Cognition Arousal/Alertness: Awake/alert Behavior During Therapy: WFL for tasks assessed/performed Overall Cognitive Status: Within Functional Limits for tasks assessed                                        Exercises Other Exercises Other Exercises: practiced improving standing tolerance and balance at edge of BSC during pericare. Pt able  to stand for 2 minutes. Multimodal cuing to stand up tall and use proper hand placement on RW.  Other Exercises: sit <> stand transfer from elevated bed to [facility] 90 with degree turn and BSC to chair with 180 degree turn, mod A +2 support at each side. Failed to stand up stright, stoops when putting weight on right LE, poor hand placement that required manual assistance for therapists. Did stand at edge of [facility] for 2 min while nursing performed pericare.  Long arc quads at edge of bed x 10 on right side, cuing for max extension of knee.     General Comments        Pertinent Vitals/Pain Pain Assessment: No/denies pain(at rest)    Home Living                      Prior Function            PT Goals (current goals can now be found in the care plan section) Acute Rehab PT Goals Patient Stated Goal: get stronger and return home PT Goal Formulation: With patient Time For Goal Achievement: 01/16/19 Potential to Achieve Goals: Fair Progress towards PT goals: Progressing toward goals(slowly)    Frequency    Min 2X/week      PT Plan Current plan remains appropriate    Co-evaluation              AM-PAC PT "6 Clicks" Mobility   Outcome Measure  Help needed turning from your back to your side while in a flat bed without using bedrails?: A Little Help needed moving from lying on your back to sitting on the side of a flat bed without using bedrails?: A Little Help needed moving to and from a bed to a chair (including a wheelchair)?: A Lot Help needed standing up from a chair using your arms (e.g., wheelchair or bedside chair)?: A Lot Help needed to walk in hospital room?: Total Help needed climbing 3-5 steps with a railing? : Total 6 Click Score: 12    End of Session Equipment Utilized During Treatment: Gait belt Activity Tolerance: Patient tolerated treatment well;Patient limited by fatigue;Patient limited by pain Patient left: in chair;with call bell/phone within  reach;with chair alarm set;with SCD's reapplied Nurse Communication: Mobility status;Other (comment)(results of session) PT Visit Diagnosis: Unsteadiness on feet (R26.81);Muscle weakness (generalized) (M62.81);History of falling (Z91.81);Difficulty in walking, not elsewhere classified (R26.2)     Time: 0263-7858 PT Time Calculation (min) (ACUTE ONLY): 43 min  Charges:  $Therapeutic Activity: 38-52 mins                     Everlean Alstrom. Graylon Good, PT, DPT 01/03/19, 9:58 AM

## 2019-01-03 NOTE — Consult Note (Signed)
[facility] for Warfarin Dosing and Monitoring  Indication: atrial fibrillation  No Known Allergies  Patient Measurements: Height: 5\' 6"  (167.6 cm) Weight: (!) 323 lb (146.5 kg) IBW/kg (Calculated) : 59.3  Vital Signs: Temp: 97.7 F (36.5 C) (04/04 0443) BP: 138/47 (04/04 0443) Pulse Rate: 65 (04/04 0443)  Labs: Recent Labs    01/01/19 0504 01/02/19 0437 01/03/19 0449  HGB 10.8* 8.5* 7.7*  HCT 34.0* 27.5* 25.9*  PLT 323 248 226  LABPROT >90.0* 36.8* 34.3*  INR >10.0* 3.8* 3.5*  CREATININE 1.67* 2.11* 2.02*  CKTOTAL 223  --   --     Estimated Creatinine Clearance: 36.3 mL/min (A) (by C-G formula based on SCr of 2.02 mg/dL (H)).   Medical History: Past Medical History:  Diagnosis Date  . (HFpEF) heart failure with preserved ejection fraction ([place])    a. 03/2017 Echo: EF 55-60%, no rwma, Gr1 DD, mild to mod MS, mod to sev dil LA.  [name] Arthritis   . Carotid arterial disease ([place])    a. 11/2017 CTA Head/Neck: RICA 45, LICA 60.  [name] Cerebrovascular disease    a. 11/2017 MRA: mod to sev stenosis of bilat PCA's P2 segments; b. 11/2017 CTA Head/Neck: RICA 45, LICA 60, sev bilat distal MCA branch stenoses w/ sev bilat P2 stenoses.  . CKD (chronic kidney disease), stage III ([place])   . Depression   . GERD (gastroesophageal reflux disease)   . Hyperlipidemia   . Hypertension   . Left Atrial Appendage Thrombus    a. 11/2017 TEE (performed in setting of stroke/aphasia): nl EF, mod MS, dil LA w/ heavy smoke and small LAA thrombus-->Warfarin initiated.  . Mitral stenosis    a. Mild to moderate by echo 03/2017; b. 03/2017 R/LHC: PCWP 66mmHg, LVEDP 75mmHg. Mean grad of 6mmHg. Valve area of 2.02cm^2; c. 11/2017 TEE: Mod MS.  . Morbid obesity ([place])   . Murmur   . Non-obstructive CAD (coronary artery disease)    a. 03/2017 Cath: mild, non-obstructive CAD.  [name] OSA (obstructive sleep apnea)    a. Previously wore CPAP for several years but stopped on her own  and now just uses O2 via [place] @ night.  [name] PAH (pulmonary artery hypertension) ([place])    a. 03/2017 RHC: PA 50mmHg.  . Seasonal allergies   . Stroke [facility])    a. 11/2017 - presented w/ aphasia->MRI acute to early subacute ischemia w/in multiple vascular territories, largest in R frontal & L parietal lobes. Smaller foci of ischemia w/in R hippocampus, R parietal lobe, and L peripheral postcentral gyrus.  . Type 2 diabetes mellitus ([facility])   . Urinary incontinence    Assessment: Pharmacy consulted for warfarin dosing and monitoring for a 73 yo female w/PMH of A.fib.   Patient was taking warfarin 5mg  PTA.  INR on admission was >10. Patient received Vitamin K 10mg  x 1 dose.   DATE INR DOSE   HGB 4/2 >10 Held        10.8 4/3         3.8     Held         8.5  4/4         3.5     Held         7.7         Goal of Therapy:  INR 2-3 Monitor platelets by anticoagulation protocol: Yes   Plan:  Warfarin will be held today.   INR ordered with am  labs.  Pharmacy will continue to follow and order warfarin when INR in therapeutic range.   Will monitor CBC at least every 3 days per protocol.   Olivia Canter [facility] Clinical Pharmacist 01/03/2019 8:15 AM

## 2019-01-03 NOTE — Progress Notes (Signed)
[place] [facility] [place] NAME: Megan Obrien    MR#:  [id number]  DATE OF BIRTH:  11-Jun-1946  SUBJECTIVE:  patient feels a lot better. No fever. Eating a little better.  REVIEW OF SYSTEMS:   Review of Systems  Constitutional: Negative for chills, fever and weight loss.  HENT: Negative for ear discharge, ear pain and nosebleeds.   Eyes: Negative for blurred vision, pain and discharge.  Respiratory: Negative for sputum production, shortness of breath, wheezing and stridor.   Cardiovascular: Negative for chest pain, palpitations, orthopnea and PND.  Gastrointestinal: Negative for abdominal pain, diarrhea, nausea and vomiting.  Genitourinary: Negative for frequency and urgency.  Musculoskeletal: Positive for joint pain. Negative for back pain.  Neurological: Positive for weakness. Negative for sensory change, speech change and focal weakness.  Psychiatric/Behavioral: Negative for depression and hallucinations. The patient is not nervous/anxious.    Tolerating Diet:yesTolerating PT: yes  DRUG ALLERGIES:  No Known Allergies  VITALS:  Blood pressure (!) 138/47, pulse 65, temperature 97.7 F (36.5 C), resp. rate 15, height 5\' 6"  (1.676 m), weight (!) 146.5 kg, SpO2 100 %.  PHYSICAL EXAMINATION:   Physical Exam  GENERAL:  73 y.o.-year-old patient lying in the bed with no acute distress. Obese ++ EYES: Pupils equal, round, reactive to light and accommodation. No scleral icterus.   HEENT: Head atraumatic, normocephalic. Oropharynx and nasopharynx clear.  NECK:  Supple, no jugular venous distention. No thyroid enlargement, no tenderness.  LUNGS: Normal breath sounds bilaterally, no wheezing, rales, rhonchi. No use of accessory muscles of respiration.  CARDIOVASCULAR: S1, S2 normal. No murmurs, rubs, or gallops.  ABDOMEN: Soft, nontender, nondistended. Bowel sounds present. No organomegaly or mass.  EXTREMITIES: No cyanosis, clubbing or  edema b/l.   Right knee DJD+ NEUROLOGIC: Cranial nerves II through XII are intact. No focal Motor or sensory deficits b/l.   PSYCHIATRIC:  patient is alert and oriented x 3.  SKIN: skin tears which are wrapped with banded and bruising  LABORATORY PANEL:  CBC Recent Labs  Lab 01/03/19 0449  WBC 7.7  HGB 7.7*  HCT 25.9*  PLT 226    Chemistries  Recent Labs  Lab 01/01/19 0504  01/03/19 0449  NA 140   < > 139  K 2.8*   < > 3.8  CL 101   < > 107  CO2 25   < > 25  GLUCOSE 119*   < > 148*  BUN 36*   < > 46*  CREATININE 1.67*   < > 2.02*  CALCIUM 8.6*   < > 7.8*  MG  --    < > 1.9  AST 40  --   --   ALT 26  --   --   ALKPHOS 84  --   --   BILITOT 2.0*  --   --    < > = values in this interval not displayed.   Cardiac Enzymes No results for input(s): TROPONINI in the last 168 hours. RADIOLOGY:  No results found. ASSESSMENT AND PLAN:  73 year old female admitted for sepsis.  1.  Sepsis: The patient meets criteria via cytosis, tachycardia and tachypnea.  sHe is hemodynamically stable.   -UC ecoli--pansensitive -BC negative -Source appears to be urine.  2.UTI: Present on admission; continue ceftriaxone--keflex  3. Diabetes mellitus type 2: Hold oral hypoglycemic agents.   -Continue basal insulin therapy.  Sliding scale insulin while hospitalized.  4. History of stroke: With associated atrial thrombus.  -  Currently INR is greater than 10.  Hold warfarin.  Consult pharmacy for dosing. -INR 3.4 -resume warfarin per pharmacy  5.DVT prophylaxis: SCDs  6. Acute on CKDIII -Baseline creat 1.5-1.7-2.1--2.02 -recieved IVF and po fluid intake  D/w [name]--- pt is adamant to go home. Told pt given the amount of help she has it is not safe d/c planning. dter will try to talk with her. If she agrees then d/c today to rehab else d/c home with HHPT   Case discussed with Care Management/Social Worker. Management plans discussed with the patient, family and they are in  agreement.  CODE STATUS: FULL DVT Prophylaxis: Warfarin  TOTAL TIME TAKING CARE OF THIS PATIENT: *30* minutes.  >50% time spent on counselling and coordination of care  POSSIBLE D/C IN *1* DAYS, DEPENDING ON CLINICAL CONDITION.  Note: This dictation was prepared with Dragon dictation along with smaller phrase technology. Any transcriptional errors that result from this process are unintentional.  Fritzi Mandes M.D on 01/03/2019 at 11:29 AM  Between 7am to 6pm - Pager - [phone]  After 6pm go to [email or web address] - password [email or web address] [facility] [place] [facility]  Office  [phone]  CC: Primary care physician; Pleas Koch, NPPatient ID: Megan Obrien, female   DOB: Feb 06, 1946, 73 y.o.   MRN: [id number]

## 2019-01-03 NOTE — Consult Note (Signed)
PHARMACY CONSULT NOTE - FOLLOW UP  Pharmacy Consult for Electrolyte Monitoring and Replacement   Recent Labs: Potassium (mmol/L)  Date Value  01/03/2019 3.8   Magnesium (mg/dL)  Date Value  01/03/2019 1.9   Calcium (mg/dL)  Date Value  01/03/2019 7.8 (L)   Albumin (g/dL)  Date Value  01/01/2019 3.0 (L)   Sodium (mmol/L)  Date Value  01/03/2019 139  11/24/2018 142     Assessment: Pharmacy has been consulted to monitor/replenish electrolytes in this 73 yo female with hx of anticoagulation related bruising/weakness  Goal of Therapy:  Electrolytes WNL's  Plan:    No supplementation is warranted at this time.  Recheck electrolytes with AM labs.   Olivia Canter [facility] Clinical Pharmacist 01/03/2019 8:13 AM

## 2019-01-04 LAB — BASIC METABOLIC PANEL
Anion gap: 7 (ref 5–15)
BUN: 42 mg/dL — ABNORMAL HIGH (ref 8–23)
CO2: 27 mmol/L (ref 22–32)
Calcium: 8.4 mg/dL — ABNORMAL LOW (ref 8.9–10.3)
Chloride: 106 mmol/L (ref 98–111)
Creatinine, Ser: 1.86 mg/dL — ABNORMAL HIGH (ref 0.44–1.00)
GFR calc Af Amer: 30 mL/min — ABNORMAL LOW (ref >60)
GFR calc non Af Amer: 26 mL/min — ABNORMAL LOW (ref >60)
Glucose, Bld: 140 mg/dL — ABNORMAL HIGH (ref 70–99)
Potassium: 4 mmol/L (ref 3.5–5.1)
Sodium: 140 mmol/L (ref 135–145)

## 2019-01-04 LAB — GLUCOSE, CAPILLARY
Glucose-Capillary: 121 mg/dL — ABNORMAL HIGH (ref 70–99)
Glucose-Capillary: 117 mg/dL — ABNORMAL HIGH (ref 70–99)
Glucose-Capillary: 151 mg/dL — ABNORMAL HIGH (ref 70–99)

## 2019-01-04 LAB — PROTIME-INR
INR: 2.9 — ABNORMAL HIGH (ref 0.8–1.2)
Prothrombin Time: 29.6 seconds — ABNORMAL HIGH (ref 11.4–15.2)

## 2019-01-04 MED ORDER — WARFARIN SODIUM 2.5 MG PO TABS: 2.5000 mg | ORAL_TABLET | Freq: Every day | ORAL | Status: DC

## 2019-01-04 MED ORDER — WARFARIN SODIUM 5 MG PO TABS
5.0000 mg | ORAL_TABLET | Freq: Once | ORAL | Status: AC
  Administered 2019-01-04: 5 mg via ORAL
  Filled 2019-01-04: qty 1

## 2019-01-04 NOTE — Progress Notes (Signed)
[place] at [place] NAME: Stana Bayon    MR#:  [id number]  DATE OF BIRTH:  [birth year]  SUBJECTIVE:  patient feels a lot better. No fever. Eating  better.  REVIEW OF SYSTEMS:   Review of Systems  Constitutional: Negative for chills, fever and weight loss.  HENT: Negative for ear discharge, ear pain and nosebleeds.   Eyes: Negative for blurred vision, pain and discharge.  Respiratory: Negative for sputum production, shortness of breath, wheezing and stridor.   Cardiovascular: Negative for chest pain, palpitations, orthopnea and PND.  Gastrointestinal: Negative for abdominal pain, diarrhea, nausea and vomiting.  Genitourinary: Negative for frequency and urgency.  Musculoskeletal: Positive for joint pain. Negative for back pain.  Neurological: Positive for weakness. Negative for sensory change, speech change and focal weakness.  Psychiatric/Behavioral: Negative for depression and hallucinations. The patient is not nervous/anxious.    Tolerating Diet:yesTolerating PT: yes  DRUG ALLERGIES:  No Known Allergies  VITALS:  Blood pressure (!) 141/72, pulse 65, temperature 98.5 F (36.9 C), temperature source Tympanic, resp. rate 19, height 5\' 6"  (1.676 m), weight (!) 143.3 kg, SpO2 99 %.  PHYSICAL EXAMINATION:   Physical Exam  GENERAL:  73 y.o.-year-old patient lying in the bed with no acute distress. Obese ++ EYES: Pupils equal, round, reactive to light and accommodation. No scleral icterus.   HEENT: Head atraumatic, normocephalic. Oropharynx and nasopharynx clear.  NECK:  Supple, no jugular venous distention. No thyroid enlargement, no tenderness.  LUNGS: Normal breath sounds bilaterally, no wheezing, rales, rhonchi. No use of accessory muscles of respiration.  CARDIOVASCULAR: S1, S2 normal. No murmurs, rubs, or gallops.  ABDOMEN: Soft, nontender, nondistended. Bowel sounds present. No organomegaly or mass.  EXTREMITIES: No  cyanosis, clubbing or edema b/l.   Right knee DJD+ NEUROLOGIC: Cranial nerves II through XII are intact. No focal Motor or sensory deficits b/l.   PSYCHIATRIC:  patient is alert and oriented x 3.  SKIN: skin tears which are wrapped with banded and bruising  LABORATORY PANEL:  CBC Recent Labs  Lab 01/03/19 0449  WBC 7.7  HGB 7.7*  HCT 25.9*  PLT 226    Chemistries  Recent Labs  Lab 01/01/19 0504  01/03/19 0449 01/04/19 0448  NA 140   < > 139 140  K 2.8*   < > 3.8 4.0  CL 101   < > 107 106  CO2 25   < > 25 27  GLUCOSE 119*   < > 148* 140*  BUN 36*   < > 46* 42*  CREATININE 1.67*   < > 2.02* 1.86*  CALCIUM 8.6*   < > 7.8* 8.4*  MG  --    < > 1.9  --   AST 40  --   --   --   ALT 26  --   --   --   ALKPHOS 84  --   --   --   BILITOT 2.0*  --   --   --    < > = values in this interval not displayed.   Cardiac Enzymes No results for input(s): TROPONINI in the last 168 hours. RADIOLOGY:  Dg Knee Complete 4 Views Right  Result Date: 01/03/2019 CLINICAL DATA:  Right knee pain. No known injury. EXAM: RIGHT KNEE - COMPLETE 4+ VIEW COMPARISON:  06/01/2009 FINDINGS: No evidence of fracture or dislocation. A large knee joint effusion is seen. Severe tricompartmental osteoarthritis is again seen, without significant change since  previous study. Mild lateral subluxation of tibia noted. Generalized osteopenia also demonstrated. IMPRESSION: 1. Large knee joint effusion. 2. Severe tricompartmental osteoarthritis, without significant change. 3. Osteopenia. Electronically Signed   By: Earle Gell M.D.   On: 01/03/2019 12:01   ASSESSMENT AND PLAN:  73 year old female admitted for sepsis.  1.  Sepsis: The patient meets criteria via cytosis, tachycardia and tachypnea.  sHe is hemodynamically stable.   -UC ecoli--pansensitive -BC negative -Source appears to be urine.  2.UTI: Present on admission; continue ceftriaxone--keflex  3. Diabetes mellitus type 2: Hold oral hypoglycemic agents.    -Continue basal insulin therapy.  Sliding scale insulin while hospitalized.  4. History of stroke: With associated atrial thrombus.  - Currently INR is greater than 10.  Hold warfarin.  Consult pharmacy for dosing. -INR 3.4--2.8 -resume warfarin per pharmacy  5.DVT prophylaxis: SCDs  6. Acute on CKDIII -Baseline creat 1.5-1.7-2.1--2.02--1.86 (baseline) -recieved IVF and po fluid intake  D/w [facility]---pt Is now agreeable to go to Rehab D/c in am to [facility]   CODE STATUS: FULL DVT Prophylaxis: Warfarin  TOTAL TIME TAKING CARE OF THIS PATIENT: *30* minutes.  >50% time spent on counselling and coordination of care  POSSIBLE D/C IN *1* DAYS, DEPENDING ON CLINICAL CONDITION.  Note: This dictation was prepared with Dragon dictation along with smaller phrase technology. Any transcriptional errors that result from this process are unintentional.  Fritzi Mandes M.D on 01/04/2019 at 11:43 AM  Between 7am to 6pm - Pager - [phone]  After 6pm go to [email or web address] - password [email or web address] [facility]  Office  [phone]  CC: Primary care physician; Pleas Koch, NPPatient ID: [name], female   DOB: April 21, 1946, 73 y.o.   MRN: [id number]

## 2019-01-04 NOTE — TOC Progression Note (Signed)
Transition of Care [facility]) - Progression Note    Patient Details  Name: KIASHA BELLIN MRN: [id number] Date of Birth: 1945-10-16  Transition of Care [facility]) CM/SW [place], [place] Phone Number: 01/04/2019, 8:49 AM  Clinical Narrative:   The CSW attempted to contact the patient's daughter, [name], to update as to change in discharge plan due to [facility] being unable to accept the patient until tomorrow, 4/6. The CSW left a HIPPA compliant voicemail. TOC team is following.    Expected Discharge Plan: [place] Barriers to Discharge: Continued Medical Work up  Expected Discharge Plan and Services Expected Discharge Plan: [place] arrangements for the past 2 months: Single Family Home                           Social Determinants of Health (SDOH) Interventions    Readmission Risk Interventions No flowsheet data found.

## 2019-01-04 NOTE — Consult Note (Signed)
[place] for Warfarin Dosing and Monitoring  Indication: atrial fibrillation  No Known Allergies  Patient Measurements: Height: 5\' 6"  (167.6 cm) Weight: (!) 316 lb (143.3 kg) IBW/kg (Calculated) : 59.3  Vital Signs: Temp: 98.5 F (36.9 C) (04/05 0615) Temp Source: Tympanic (04/05 0615) BP: 141/72 (04/05 0615) Pulse Rate: 65 (04/05 0615)  Labs: Recent Labs    01/02/19 0437 01/03/19 0449 01/04/19 0447  HGB 8.5* 7.7*  --   HCT 27.5* 25.9*  --   PLT 248 226  --   LABPROT 36.8* 34.3* 29.6*  INR 3.8* 3.5* 2.9*  CREATININE 2.11* 2.02*  --     Estimated Creatinine Clearance: 35.8 mL/min (A) (by C-G formula based on SCr of 2.02 mg/dL (H)).   Medical History: Past Medical History:  Diagnosis Date  . (HFpEF) heart failure with preserved ejection fraction ([place])    a. 03/2017 Echo: EF 55-60%, no rwma, Gr1 DD, mild to mod MS, mod to sev dil LA.  [name] Arthritis   . Carotid arterial disease ([place])    a. 11/2017 CTA Head/Neck: RICA 45, LICA 60.  [name] Cerebrovascular disease    a. 11/2017 MRA: mod to sev stenosis of bilat PCA's P2 segments; b. 11/2017 CTA Head/Neck: RICA 45, LICA 60, sev bilat distal MCA branch stenoses w/ sev bilat P2 stenoses.  . CKD (chronic kidney disease), stage III ([place])   . Depression   . GERD (gastroesophageal reflux disease)   . Hyperlipidemia   . Hypertension   . Left Atrial Appendage Thrombus    a. 11/2017 TEE (performed in setting of stroke/aphasia): nl EF, mod MS, dil LA w/ heavy smoke and small LAA thrombus-->Warfarin initiated.  . Mitral stenosis    a. Mild to moderate by echo 03/2017; b. 03/2017 R/LHC: PCWP 19mmHg, LVEDP 63mmHg. Mean grad of 16mmHg. Valve area of 2.02cm^2; c. 11/2017 TEE: Mod MS.  . Morbid obesity ([place])   . Murmur   . Non-obstructive CAD (coronary artery disease)    a. 03/2017 Cath: mild, non-obstructive CAD.  [name] OSA (obstructive sleep apnea)    a. Previously wore CPAP for several years but stopped on her  own and now just uses O2 via [place] @ night.  [name] PAH (pulmonary artery hypertension) ([place])    a. 03/2017 RHC: PA 12mmHg.  . Seasonal allergies   . Stroke [facility])    a. 11/2017 - presented w/ aphasia->MRI acute to early subacute ischemia w/in multiple vascular territories, largest in R frontal & L parietal lobes. Smaller foci of ischemia w/in R hippocampus, R parietal lobe, and L peripheral postcentral gyrus.  . Type 2 diabetes mellitus ([place])   . Urinary incontinence    Assessment: Pharmacy consulted for warfarin dosing and monitoring for a 73 yo female w/PMH of A.fib.   Patient was taking warfarin 5mg  PTA.  INR on admission was >10. Patient received Vitamin K 10mg  x 1 dose.   DATE INR DOSE     4/2 >10 Held         4/3         3.8     Held           4/4         3.5     Held   4/5         2.9                 Goal of Therapy:  INR 2-3 Monitor platelets by anticoagulation protocol:  Yes   Plan:  Warfarin 5mg  tonight.   INR ordered with am labs.  Pharmacy will continue to follow and order warfarin when INR in therapeutic range.   Will monitor CBC at least every 3 days per protocol.   Olivia Canter, [facility] Clinical Pharmacist 01/04/2019 7:30 AM

## 2019-01-04 NOTE — Progress Notes (Signed)
PT Cancellation Note  Patient Details Name: LENIYAH MARTELL MRN: [id number] DOB: 12-Mar-1946   Cancelled Treatment:    Reason Eval/Treat Not Completed: Other (comment)(Patient eating lunch). Patient in the middle of eating lunch. Requests PT return once she has eaten. Will return at later time/date.   Janna Arch, PT, DPT    01/04/2019, 1:22 PM

## 2019-01-04 NOTE — Plan of Care (Signed)

## 2019-01-04 NOTE — Progress Notes (Signed)
PT Cancellation Note  Patient Details Name: Megan Obrien MRN: [id number] DOB: 1946/08/22   Cancelled Treatment:    Reason Eval/Treat Not Completed: Other (comment)(on bedpan) Patient on bedpan upon PT arrival. Patient states "it will be a while". Will attempt again at later time/date.   Janna Arch, PT, DPT   01/04/2019, 2:26 PM

## 2019-01-05 DIAGNOSIS — R404 Transient alteration of awareness: Secondary | ICD-10-CM | POA: Diagnosis not present

## 2019-01-05 DIAGNOSIS — I10 Essential (primary) hypertension: Secondary | ICD-10-CM | POA: Diagnosis not present

## 2019-01-05 DIAGNOSIS — M2550 Pain in unspecified joint: Secondary | ICD-10-CM | POA: Diagnosis not present

## 2019-01-05 DIAGNOSIS — E119 Type 2 diabetes mellitus without complications: Secondary | ICD-10-CM | POA: Diagnosis not present

## 2019-01-05 DIAGNOSIS — R498 Other voice and resonance disorders: Secondary | ICD-10-CM | POA: Diagnosis not present

## 2019-01-05 DIAGNOSIS — K219 Gastro-esophageal reflux disease without esophagitis: Secondary | ICD-10-CM | POA: Diagnosis not present

## 2019-01-05 DIAGNOSIS — K5909 Other constipation: Secondary | ICD-10-CM | POA: Diagnosis not present

## 2019-01-05 DIAGNOSIS — A419 Sepsis, unspecified organism: Secondary | ICD-10-CM | POA: Diagnosis not present

## 2019-01-05 DIAGNOSIS — M6281 Muscle weakness (generalized): Secondary | ICD-10-CM | POA: Diagnosis not present

## 2019-01-05 DIAGNOSIS — N390 Urinary tract infection, site not specified: Secondary | ICD-10-CM | POA: Diagnosis not present

## 2019-01-05 DIAGNOSIS — I482 Chronic atrial fibrillation, unspecified: Secondary | ICD-10-CM | POA: Diagnosis not present

## 2019-01-05 DIAGNOSIS — D649 Anemia, unspecified: Secondary | ICD-10-CM | POA: Diagnosis not present

## 2019-01-05 DIAGNOSIS — Z130 Encounter for screening for diseases of the blood and blood-forming organs and certain disorders involving the immune mechanism: Secondary | ICD-10-CM | POA: Diagnosis not present

## 2019-01-05 DIAGNOSIS — R509 Fever, unspecified: Secondary | ICD-10-CM | POA: Diagnosis not present

## 2019-01-05 DIAGNOSIS — Z7401 Bed confinement status: Secondary | ICD-10-CM | POA: Diagnosis not present

## 2019-01-05 DIAGNOSIS — N3000 Acute cystitis without hematuria: Secondary | ICD-10-CM | POA: Diagnosis not present

## 2019-01-05 DIAGNOSIS — G4730 Sleep apnea, unspecified: Secondary | ICD-10-CM | POA: Diagnosis not present

## 2019-01-05 DIAGNOSIS — Z7901 Long term (current) use of anticoagulants: Secondary | ICD-10-CM | POA: Diagnosis not present

## 2019-01-05 DIAGNOSIS — R319 Hematuria, unspecified: Secondary | ICD-10-CM | POA: Diagnosis not present

## 2019-01-05 DIAGNOSIS — Z8673 Personal history of transient ischemic attack (TIA), and cerebral infarction without residual deficits: Secondary | ICD-10-CM | POA: Diagnosis not present

## 2019-01-05 DIAGNOSIS — M25572 Pain in left ankle and joints of left foot: Secondary | ICD-10-CM | POA: Diagnosis not present

## 2019-01-05 DIAGNOSIS — M79672 Pain in left foot: Secondary | ICD-10-CM | POA: Diagnosis not present

## 2019-01-05 DIAGNOSIS — I4891 Unspecified atrial fibrillation: Secondary | ICD-10-CM | POA: Diagnosis not present

## 2019-01-05 DIAGNOSIS — E785 Hyperlipidemia, unspecified: Secondary | ICD-10-CM | POA: Diagnosis not present

## 2019-01-05 DIAGNOSIS — R531 Weakness: Secondary | ICD-10-CM | POA: Diagnosis not present

## 2019-01-05 LAB — PROTIME-INR
INR: 2.8 — ABNORMAL HIGH (ref 0.8–1.2)
Prothrombin Time: 28.9 seconds — ABNORMAL HIGH (ref 11.4–15.2)

## 2019-01-05 LAB — GLUCOSE, CAPILLARY
Glucose-Capillary: 89 mg/dL (ref 70–99)
Glucose-Capillary: 127 mg/dL — ABNORMAL HIGH (ref 70–99)

## 2019-01-05 MED ORDER — DOCUSATE SODIUM 100 MG PO CAPS: 100.0000 mg | ORAL_CAPSULE | Freq: Two times a day (BID) | ORAL | 0 refills | Status: AC

## 2019-01-05 MED ORDER — WARFARIN SODIUM 5 MG PO TABS
5.0000 mg | ORAL_TABLET | Freq: Once | ORAL | Status: DC
  Filled 2019-01-05: qty 1

## 2019-01-05 NOTE — Discharge Summary (Signed)
[place] at [place] NAME: Megan Obrien    MR#:  [id number]  DATE OF BIRTH:  Sep 20, 1946  DATE OF ADMISSION:  01/01/2019 ADMITTING PHYSICIAN: Harrie Foreman, MD  DATE OF DISCHARGE: 01/05/2019  PRIMARY CARE PHYSICIAN: Pleas Koch, NP   ADMISSION DIAGNOSIS:  Acute cystitis without hematuria [N30.00] Sepsis, due to unspecified organism, unspecified whether acute organ dysfunction present ([place]) [A41.9]  DISCHARGE DIAGNOSIS:  Active Problems:   Sepsis ([place])   SECONDARY DIAGNOSIS:   Past Medical History:  Diagnosis Date  . (HFpEF) heart failure with preserved ejection fraction ([place])    a. 03/2017 Echo: EF 55-60%, no rwma, Gr1 DD, mild to mod MS, mod to sev dil LA.  [name] Arthritis   . Carotid arterial disease ([place])    a. 11/2017 CTA Head/Neck: RICA 45, LICA 60.  [name] Cerebrovascular disease    a. 11/2017 MRA: mod to sev stenosis of bilat PCA's P2 segments; b. 11/2017 CTA Head/Neck: RICA 45, LICA 60, sev bilat distal MCA branch stenoses w/ sev bilat P2 stenoses.  . CKD (chronic kidney disease), stage III ([place])   . Depression   . GERD (gastroesophageal reflux disease)   . Hyperlipidemia   . Hypertension   . Left Atrial Appendage Thrombus    a. 11/2017 TEE (performed in setting of stroke/aphasia): nl EF, mod MS, dil LA w/ heavy smoke and small LAA thrombus-->Warfarin initiated.  . Mitral stenosis    a. Mild to moderate by echo 03/2017; b. 03/2017 R/LHC: PCWP 50mmHg, LVEDP 63mmHg. Mean grad of 19mmHg. Valve area of 2.02cm^2; c. 11/2017 TEE: Mod MS.  . Morbid obesity ([place])   . Murmur   . Non-obstructive CAD (coronary artery disease)    a. 03/2017 Cath: mild, non-obstructive CAD.  [name] OSA (obstructive sleep apnea)    a. Previously wore CPAP for several years but stopped on her own and now just uses O2 via [place] @ night.  [name] PAH (pulmonary artery hypertension) ([place])    a. 03/2017 RHC: PA 8mmHg.  . Seasonal allergies   . Stroke [facility])    a. 11/2017 -  presented w/ aphasia->MRI acute to early subacute ischemia w/in multiple vascular territories, largest in R frontal & L parietal lobes. Smaller foci of ischemia w/in R hippocampus, R parietal lobe, and L peripheral postcentral gyrus.  . Type 2 diabetes mellitus ([place])   . Urinary incontinence      ADMITTING HISTORY  Chief Complaint: Weakness HPI: The patient with past medical history of stroke, retention, obstructive sleep apnea, diabetes and hyperlipidemia resents to the emergency department due to weakness.  The patient was so weak that she could not stand after sitting on the commode.  She reports that she had been sitting for approximately 4 hours.  The patient also reports that she has had a lot of bleeding from bruises all over her body.  She denies seeing blood in her urine or stool.  Laboratory evaluation in the emergency department revealed supratherapeutic INR as well as urinary tract infection which prompted the emergency department staff to call the hospitalist service for admission.  HOSPITAL COURSE:   73 year old female admitted for sepsis.  1. Sepsis secondary to UTI Admitted on IV ceftriaxone and then changed to Keflex. Pansensitive E coli in cultures. Finished Antibiotic course  2.UTI: Present on admission; continue ceftriaxone--keflex  3.Diabetes mellitus type 2 SSI in hospital No change to home meds at discharge  4. History of stroke: With associated atrial thrombus.  Resumed Warfarin INR was elevated on admission. Will need repeat INR within 1 week  5.DVT prophylaxis: ON coumadin  6. Acute on CKDIII -Baseline creat 1.5-1.7-2.1--2.02--1.86 (baseline) -recieved IVF and po fluid intake  Stable for d/c to SNF for further PT  CONSULTS OBTAINED:    DRUG ALLERGIES:  No Known Allergies  DISCHARGE MEDICATIONS:   Allergies as of 01/05/2019   No Known Allergies     Medication List    STOP taking these medications   Insulin Pen Needle 32G X 6 MM  Misc     TAKE these medications   atorvastatin 40 MG tablet Commonly known as:  LIPITOR Take 1 tablet (40 mg total) by mouth at bedtime.   carvedilol 12.5 MG tablet Commonly known as:  COREG Take 1.5 tablets (18.75 mg total) by mouth 2 (two) times daily.   docusate sodium 100 MG capsule Commonly known as:  COLACE Take 1 capsule (100 mg total) by mouth 2 (two) times daily.   FLUoxetine 40 MG capsule Commonly known as:  PROZAC Take 1 capsule by mouth once daily for depression and anxiety.   furosemide 20 MG tablet Commonly known as:  LASIX Take 1 tablet (20 mg total) by mouth daily.   glimepiride 2 MG tablet Commonly known as:  AMARYL Take 2 mg by mouth daily with breakfast.   Insulin Glargine 100 UNIT/ML Solostar Pen Commonly known as:  LANTUS Inject 15 Units into the skin every evening. What changed:   how much to take when to take this   lisinopril 40 MG tablet Commonly known as:  PRINIVIL,ZESTRIL Take 40 mg by mouth daily.   omega-3 acid ethyl esters 1 g capsule Commonly known as:  LOVAZA TAKE 1 CAPSULE BY MOUTH ONCE DAILY WITH FOOD FOR CHOLESTEROL   omeprazole 40 MG capsule Commonly known as:  PRILOSEC Take 1 capsule (40 mg total) by mouth daily.   warfarin 5 MG tablet Commonly known as:  COUMADIN Take as directed. If you are unsure how to take this medication, talk to your nurse or doctor. Original instructions:  TAKE ONE TABLET BY MOUTH AS DIRECTED BY THE COUMADIN CLINIC What changed:  See the new instructions.       Today   VITAL SIGNS:  Blood pressure (!) 171/84, pulse 73, temperature (!) 97.5 F (36.4 C), temperature source Oral, resp. rate 18, height 5\' 6"  (1.676 m), weight (!) 143.3 kg, SpO2 96 %.  I/O:    Intake/Output Summary (Last 24 hours) at 01/05/2019 1206 Last data filed at 01/05/2019 0952 Gross per 24 hour  Intake 240 ml  Output 750 ml  Net -510 ml    PHYSICAL EXAMINATION:  Physical Exam  GENERAL:  73 y.o.-year-old patient  lying in the bed with no acute distress.  LUNGS: Normal breath sounds bilaterally, no wheezing, rales,rhonchi or crepitation. No use of accessory muscles of respiration.  CARDIOVASCULAR: S1, S2 normal. No murmurs, rubs, or gallops.  ABDOMEN: Soft, non-tender, non-distended. Bowel sounds present. No organomegaly or mass.  NEUROLOGIC: Moves all 4 extremities. PSYCHIATRIC: The patient is alert and oriented x 3.  SKIN: No obvious rash, lesion, or ulcer.   DATA REVIEW:   CBC Recent Labs  Lab 01/03/19 0449  WBC 7.7  HGB 7.7*  HCT 25.9*  PLT 226    Chemistries  Recent Labs  Lab 01/01/19 0504  01/03/19 0449 01/04/19 0448  NA 140   < > 139 140  K 2.8*   < > 3.8 4.0  CL 101   < >  107 106  CO2 25   < > 25 27  GLUCOSE 119*   < > 148* 140*  BUN 36*   < > 46* 42*  CREATININE 1.67*   < > 2.02* 1.86*  CALCIUM 8.6*   < > 7.8* 8.4*  MG  --    < > 1.9  --   AST 40  --   --   --   ALT 26  --   --   --   ALKPHOS 84  --   --   --   BILITOT 2.0*  --   --   --    < > = values in this interval not displayed.    Cardiac Enzymes No results for input(s): TROPONINI in the last 168 hours.  Microbiology Results  Results for orders placed or performed during the hospital encounter of 01/01/19  Urine culture     Status: Abnormal   Collection Time: 01/01/19  5:04 AM  Result Value Ref Range Status   Specimen Description   Final    URINE, RANDOM Performed at [facility], [address] [place], [place] [address]    Special Requests   Final    NONE Performed at [facility], [address] [place], [place] [address]    Culture >=100,000 COLONIES/mL ESCHERICHIA COLI (A)  Final   Report Status 01/03/2019 FINAL  Final   Organism ID, Bacteria ESCHERICHIA COLI (A)  Final      Susceptibility   Escherichia coli - MIC*    AMPICILLIN 4 SENSITIVE Sensitive     CEFAZOLIN <=4 SENSITIVE Sensitive     CEFTRIAXONE <=1 SENSITIVE Sensitive     CIPROFLOXACIN <=0.25 SENSITIVE  Sensitive     GENTAMICIN <=1 SENSITIVE Sensitive     IMIPENEM <=0.25 SENSITIVE Sensitive     NITROFURANTOIN <=16 SENSITIVE Sensitive     TRIMETH/SULFA <=20 SENSITIVE Sensitive     AMPICILLIN/SULBACTAM <=2 SENSITIVE Sensitive     PIP/TAZO <=4 SENSITIVE Sensitive     Extended ESBL NEGATIVE Sensitive     * >=100,000 COLONIES/mL ESCHERICHIA COLI  Blood Culture (routine x 2)     Status: None (Preliminary result)   Collection Time: 01/01/19  6:16 AM  Result Value Ref Range Status   Specimen Description BLOOD BLOOD LEFT HAND  Final   Special Requests   Final    BOTTLES DRAWN AEROBIC AND ANAEROBIC Blood Culture adequate volume   Culture   Final    NO GROWTH 4 DAYS Performed at [facility], [address] [place], [place] [address]    Report Status PENDING  Incomplete  Blood Culture (routine x 2)     Status: None (Preliminary result)   Collection Time: 01/01/19  7:25 AM  Result Value Ref Range Status   Specimen Description BLOOD BLOOD LEFT HAND  Final   Special Requests   Final    BOTTLES DRAWN AEROBIC ONLY Blood Culture adequate volume   Culture   Final    NO GROWTH 4 DAYS Performed at [facility], [address] [place], [place] [address]    Report Status PENDING  Incomplete    RADIOLOGY:  No results found.  Follow up with PCP in 1 week.  Management plans discussed with the patient, family and they are in agreement.  CODE STATUS:     Code Status Orders  (From admission, onward)         Start     Ordered   01/01/19 0820  Full code  Continuous     01/01/19 0819        Code Status History    Date Active Date Inactive Code Status Order ID Comments User Context   12/18/2017 0501 12/23/2017 1824 Full Code [id number]  Lance Coon, MD Inpatient   04/25/2017 1033 04/25/2017 1548 Full Code [id number]  Wellington Hampshire, MD Inpatient      TOTAL TIME TAKING CARE OF THIS PATIENT ON DAY OF DISCHARGE: more than 30 minutes.   Leia Alf Jedi Catalfamo M.D on 01/05/2019  at 12:06 PM  Between 7am to 6pm - Pager - [phone]  After 6pm go to [email or web address] - password [email or web address] [place] [facility]  Office  [phone]  CC: Primary care physician; Pleas Koch, NP  Note: This dictation was prepared with Dragon dictation along with smaller phrase technology. Any transcriptional errors that result from this process are unintentional.

## 2019-01-05 NOTE — Plan of Care (Signed)

## 2019-01-05 NOTE — Progress Notes (Signed)
Reassessed patient BP 174/96, trending down after tylenol for pain in legs. Will report to day shift RN.

## 2019-01-05 NOTE — Progress Notes (Signed)
Patient complaining of 6/10 pain in lower extremities, administered prn tylenol, BP elevated to 188/ 60, HR 68. Will reassess after medication.

## 2019-01-05 NOTE — Progress Notes (Signed)
Physical Therapy Treatment Patient Details Name: Megan Obrien MRN: [id number] DOB: 1946-05-02 Today's Date: 01/05/2019    History of Present Illness Patient is a 73 year old female who presented to the hospital with weakness and diarrhea after she spent over 4 hours on the the toilet after using it durin the night and finding she was unable to get up. She was admitted with diganosis of sepsis and UTI and supratherapeutic INR of 10 causing multiple bruses over her body. Relevant PMI include dizziness with mobility, DMII, hx of stroke with associated atrial thrombus (residual mild expressive aphasia per pt), heart failure, CAD, cerebrovascular disease, CKD, depression, morbid obesity, mitral stenosis, hypertension, murumr, PAH, stroke,  DMII, urinary incontinence. back surgery, cardiac catheterization, L knee surgery, right knee pain. Her chest radiograph was negative for any acute changes. She reports her right knee is "bad."    PT Comments    Patient tolerated treatment well and is making mild progress towards goals. She was able to stand 1 minute at a time with CGA today which is an improvement from before and was able to complete more transfers overall this session. Requires min A for bed mobility and mod A for transfers from elevated surface or chair with arms with strong encouragement and cuing for proper body mechanics. Completed transfer attempt x 3 failed then transfer from elevated bed to [facility] 90 with degree turn and BSC to chair with 180 degree turn, mod A. Failed to stand up stright, stoops during weight bearing, poor hand placement that required manual assistance for therapists. Did stand at edge of [facility] for 1 min x 2 sets and completed successful sit <> stand from [facility] with mod A. Failed to back up to chair without strong cuing before attempting to sit and failed to reach back with arms, resulting in poor control during stand to sit portion of last transfer. O2 was weaned to room air during  mobility after consulting with nursing who approved and vitals remained WNL. Patient continues to need to have a bowel movement after standing during treatment sessions.    Follow Up Recommendations  SNF     Equipment Recommendations  3in1 (PT)    Recommendations for Other Services OT consult     Precautions / Restrictions Precautions Precautions: Fall Precaution Comments: reports at least 2 falls in last 6 months Restrictions Weight Bearing Restrictions: No    Mobility  Bed Mobility Overal bed mobility: Needs Assistance Bed Mobility: Supine to Sit     Supine to sit: Min assist(unable to get blankets off feet to move)     General bed mobility comments: required cuing for bed mobility technique and encouragement to stay on task and for continued effort. Rquired assistance with blankets  Transfers Overall transfer level: Needs assistance Equipment used: Rolling walker (2 wheeled)(bariatric) Transfers: Sit to/from Stand Sit to Stand: Mod assist         General transfer comment: transfer attempt x 3 failed then transfer from elevated bed to [facility] 90 with degree turn and BSC to chair with 180 degree turn, mod A. Failed to stand up stright, stoops during weight bearing, poor hand placement that required manual assistance for therapists. Did stand at edge of [facility] for 1 min x 2 sets and completed successful sit <> stand from [facility] with mod A.   Ambulation/Gait Ambulation/Gait assistance: Min assist(unable more than a few steps at this time. At risk for knees buckling.)   Assistive device: Rolling walker (2 wheeled) Gait Pattern/deviations: Shuffle  General Gait Details: able to take a few steps shuffling feet with strong encouragement 3 feet with min A. Pt failed to stand upright maintained stooped posture and fatigued quickly.    Stairs             Wheelchair Mobility    Modified Rankin (Stroke Patients Only)       Balance Overall balance assessment: Needs  assistance Sitting-balance support: No upper extremity supported Sitting balance-Leahy Scale: Good     Standing balance support: Bilateral upper extremity supported Standing balance-Leahy Scale: Fair                              Cognition Arousal/Alertness: Awake/alert Behavior During Therapy: WFL for tasks assessed/performed Overall Cognitive Status: Within Functional Limits for tasks assessed                                        Exercises Other Exercises Other Exercises: practiced sit <> stand total of 3 failed attempts and x 3 successful attempts. Cuing for hand placment.  Other Exercises: practiced standing balance at bedside commode. standing 1 min x 2 during pericare. able to maintain balance without assistance.     General Comments        Pertinent Vitals/Pain      Home Living                      Prior Function            PT Goals (current goals can now be found in the care plan section)      Frequency    Min 2X/week      PT Plan Current plan remains appropriate    Co-evaluation              AM-PAC PT "6 Clicks" Mobility   Outcome Measure  Help needed turning from your back to your side while in a flat bed without using bedrails?: A Little Help needed moving from lying on your back to sitting on the side of a flat bed without using bedrails?: A Little Help needed moving to and from a bed to a chair (including a wheelchair)?: A Lot Help needed standing up from a chair using your arms (e.g., wheelchair or bedside chair)?: A Lot Help needed to walk in hospital room?: A Lot Help needed climbing 3-5 steps with a railing? : Total 6 Click Score: 13    End of Session Equipment Utilized During Treatment: Gait belt Activity Tolerance: Patient tolerated treatment well;Patient limited by fatigue;Patient limited by pain Patient left: in chair;with call bell/phone within reach;with chair alarm set;with SCD's  reapplied Nurse Communication: Mobility status;Other (comment)(results of session) PT Visit Diagnosis: Unsteadiness on feet (R26.81);Muscle weakness (generalized) (M62.81);History of falling (Z91.81);Difficulty in walking, not elsewhere classified (R26.2)     Time: 0973-5329 PT Time Calculation (min) (ACUTE ONLY): 30 min  Charges:  $Therapeutic Activity: 23-37 mins                     Everlean Alstrom. Graylon Good, PT, DPT 01/05/19, 12:13 PM

## 2019-01-05 NOTE — TOC Progression Note (Signed)
Transition of Care [facility]) - Progression Note    Patient Details  Name: Megan Obrien MRN: [id number] Date of Birth: 1945/12/19  Transition of Care [facility]) CM/SW [place], RN Phone Number: 01/05/2019, 1:38 PM  Clinical Narrative:    Otila Kluver with [facility] notified this CM that they received insurance auth approval    Expected Discharge Plan: [place] Barriers to Discharge: Continued Medical Work up  Expected Discharge Plan and Services Expected Discharge Plan: [place] arrangements for the past 2 months: Single Family Home Expected Discharge Date: 01/05/19                         Social Determinants of Health (SDOH) Interventions    Readmission Risk Interventions No flowsheet data found.

## 2019-01-05 NOTE — Care Management Important Message (Signed)
Important Message  Patient Details  Name: Megan Obrien MRN: [id number] Date of Birth: 1946/01/31   Medicare Important Message Given:  Yes    Juliann Pulse A Kenni Newton 01/05/2019, 11:42 AM

## 2019-01-05 NOTE — Consult Note (Signed)
[place] for Warfarin Dosing and Monitoring  Indication: atrial fibrillation  No Known Allergies  Patient Measurements: Height: 5\' 6"  (167.6 cm) Weight: (!) 316 lb (143.3 kg) IBW/kg (Calculated) : 59.3  Vital Signs: Temp: 97.7 F (36.5 C) (04/06 0642) Temp Source: Oral (04/06 0642) BP: 174/96 (04/06 0642) Pulse Rate: 62 (04/06 0642)  Labs: Recent Labs    01/03/19 0449 01/04/19 0447 01/04/19 0448 01/05/19 0303  HGB 7.7*  --   --   --   HCT 25.9*  --   --   --   PLT 226  --   --   --   LABPROT 34.3* 29.6*  --  28.9*  INR 3.5* 2.9*  --  2.8*  CREATININE 2.02*  --  1.86*  --     Estimated Creatinine Clearance: 38.9 mL/min (A) (by C-G formula based on SCr of 1.86 mg/dL (H)).   Medical History: Past Medical History:  Diagnosis Date  . (HFpEF) heart failure with preserved ejection fraction ([place])    a. 03/2017 Echo: EF 55-60%, no rwma, Gr1 DD, mild to mod MS, mod to sev dil LA.  [name] Arthritis   . Carotid arterial disease ([place])    a. 11/2017 CTA Head/Neck: RICA 45, LICA 60.  [name] Cerebrovascular disease    a. 11/2017 MRA: mod to sev stenosis of bilat PCA's P2 segments; b. 11/2017 CTA Head/Neck: RICA 45, LICA 60, sev bilat distal MCA branch stenoses w/ sev bilat P2 stenoses.  . CKD (chronic kidney disease), stage III ([place])   . Depression   . GERD (gastroesophageal reflux disease)   . Hyperlipidemia   . Hypertension   . Left Atrial Appendage Thrombus    a. 11/2017 TEE (performed in setting of stroke/aphasia): nl EF, mod MS, dil LA w/ heavy smoke and small LAA thrombus-->Warfarin initiated.  . Mitral stenosis    a. Mild to moderate by echo 03/2017; b. 03/2017 R/LHC: PCWP 18mmHg, LVEDP 82mmHg. Mean grad of 30mmHg. Valve area of 2.02cm^2; c. 11/2017 TEE: Mod MS.  . Morbid obesity ([place])   . Murmur   . Non-obstructive CAD (coronary artery disease)    a. 03/2017 Cath: mild, non-obstructive CAD.  [name] OSA (obstructive sleep apnea)    a. Previously wore  CPAP for several years but stopped on her own and now just uses O2 via [place] @ night.  [name] PAH (pulmonary artery hypertension) ([place])    a. 03/2017 RHC: PA 28mmHg.  . Seasonal allergies   . Stroke [facility])    a. 11/2017 - presented w/ aphasia->MRI acute to early subacute ischemia w/in multiple vascular territories, largest in R frontal & L parietal lobes. Smaller foci of ischemia w/in R hippocampus, R parietal lobe, and L peripheral postcentral gyrus.  . Type 2 diabetes mellitus ([place])   . Urinary incontinence    Assessment: Pharmacy consulted for warfarin dosing and monitoring for a 73 yo female w/PMH of A.fib.   Patient was taking warfarin 5mg  PTA.  INR on admission was >10. Patient received Vitamin K 10mg  x 1 dose.   DATE INR DOSE     4/2 >10 Held         4/3         3.8     Held           4/4         3.5     Held   4/5         2.9  5 mg 4/6         2.8           Goal of Therapy:  INR 2-3 Monitor platelets by anticoagulation protocol: Yes   Plan:  Warfarin 5mg  tonight.   INR ordered with am labs.  Pharmacy will continue to follow.   Will monitor CBC at least every 3 days per protocol.   Paulina Fusi, PharmD, BCPS 01/05/2019 8:56 AM

## 2019-01-05 NOTE — TOC Transition Note (Signed)
Transition of Care [facility]) - CM/SW Discharge Note   Patient Details  Name: Megan Obrien MRN: [id number] Date of Birth: May 14, 1946  Transition of Care [facility]) CM/SW Contact:  Su Hilt, RN Phone Number: 01/05/2019, 1:43 PM   Clinical Narrative:    Patient to DC to [place] room 712 Bedside nurse to call report to [phone], DC packet on the chart and Bedside nurse tro call EMS to transport, daughter Santiago Glad wants to be notified by the nurse once EMS arrives to pick up the patient at [phone]   Final next level of care: [place] Barriers to Discharge: Barriers Resolved   Patient Goals and CMS Choice Patient states their goals for this hospitalization and ongoing recovery are:: " I want to go home"  CMS Medicare.gov Compare Post Acute Care list provided to:: Patient    Discharge Placement                       Discharge Plan and Services                          Social Determinants of Health (SDOH) Interventions     Readmission Risk Interventions No flowsheet data found.

## 2019-01-05 NOTE — Progress Notes (Signed)
Called and gave report to [facility] Minor RN at [facility]

## 2019-01-06 DIAGNOSIS — I10 Essential (primary) hypertension: Secondary | ICD-10-CM | POA: Diagnosis not present

## 2019-01-06 DIAGNOSIS — E119 Type 2 diabetes mellitus without complications: Secondary | ICD-10-CM | POA: Diagnosis not present

## 2019-01-06 DIAGNOSIS — Z8673 Personal history of transient ischemic attack (TIA), and cerebral infarction without residual deficits: Secondary | ICD-10-CM | POA: Diagnosis not present

## 2019-01-06 DIAGNOSIS — M6281 Muscle weakness (generalized): Secondary | ICD-10-CM | POA: Diagnosis not present

## 2019-01-06 LAB — CULTURE, BLOOD (ROUTINE X 2)
Culture: NO GROWTH
Culture: NO GROWTH
Special Requests: ADEQUATE
Special Requests: ADEQUATE

## 2019-01-09 IMAGING — DX DG HAND 2V*R*
2 series · 2 of 2 positions shown · non-contrast
Comparison: None.

CLINICAL DATA: Status post fall

EXAM:
RIGHT HAND - 2 VIEW

[hand ap]
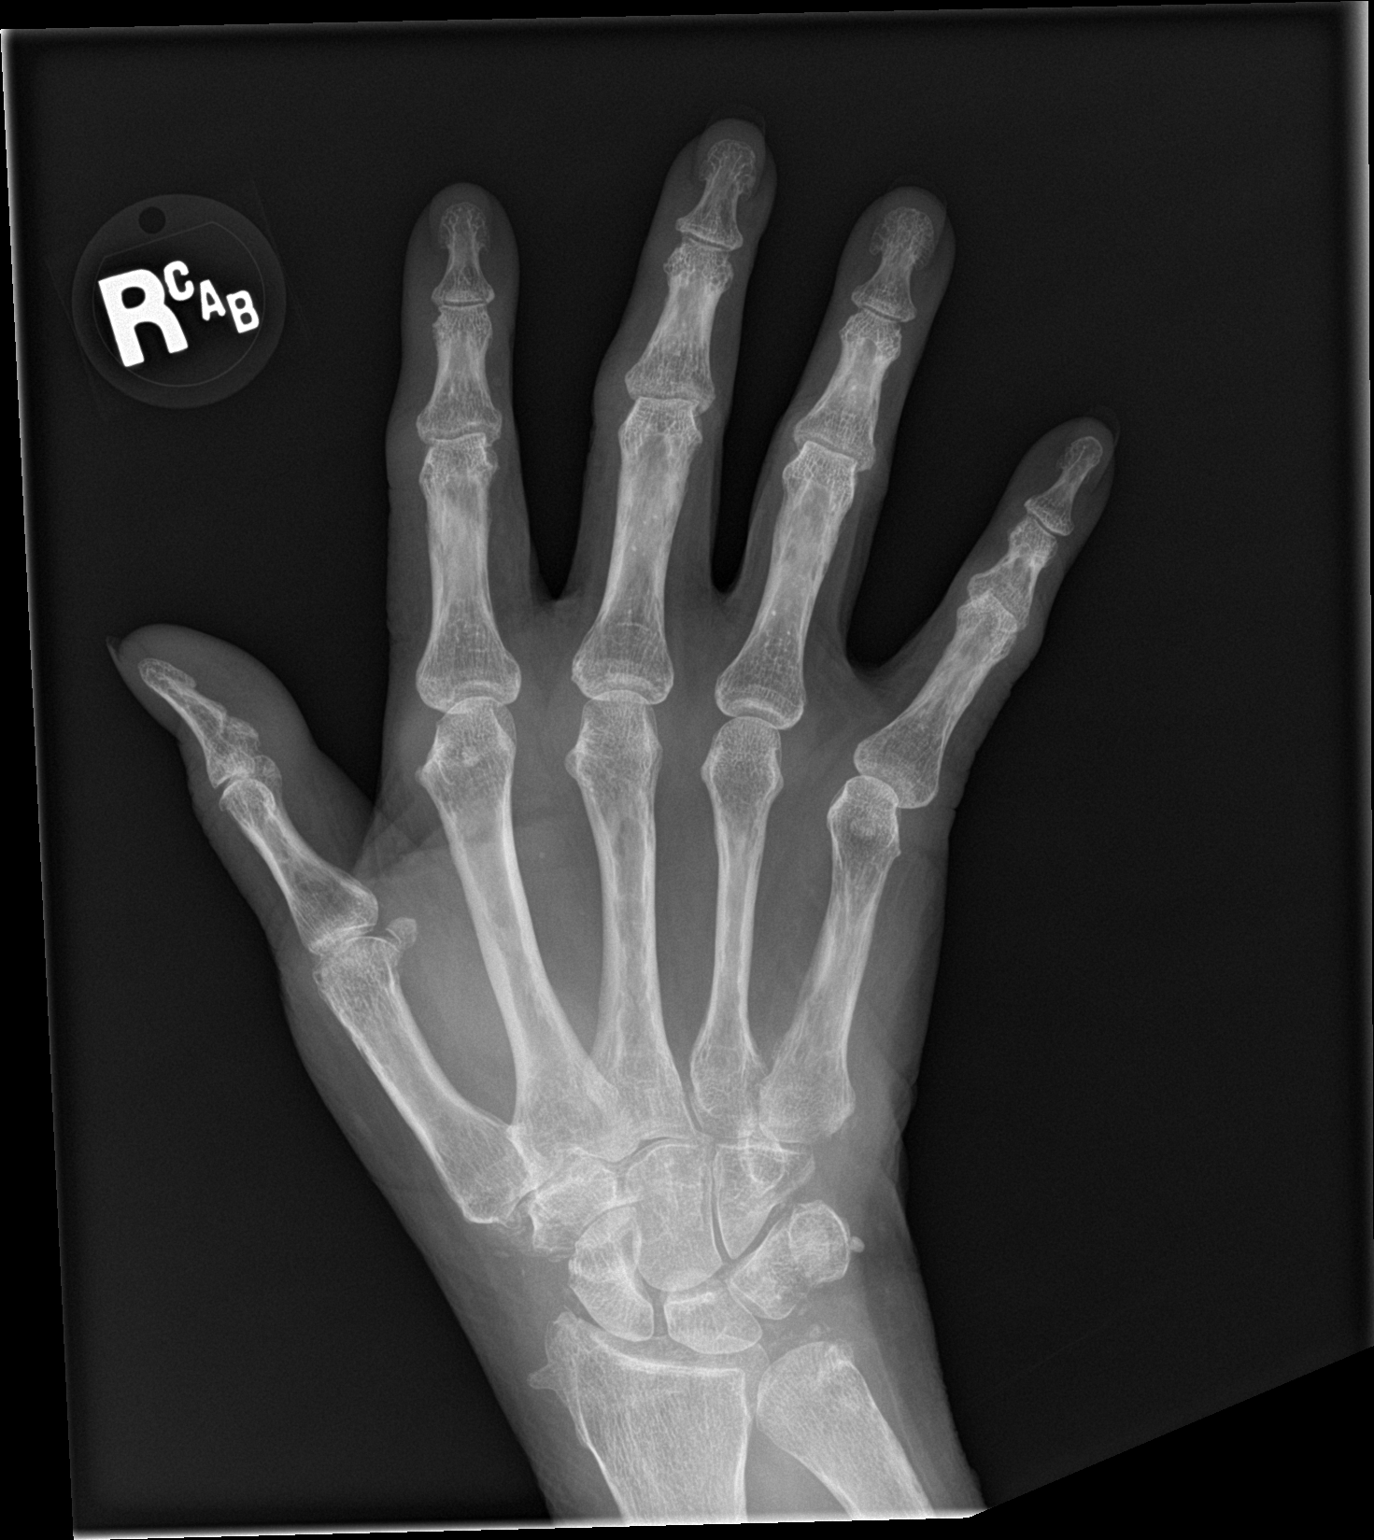

[hand lat]
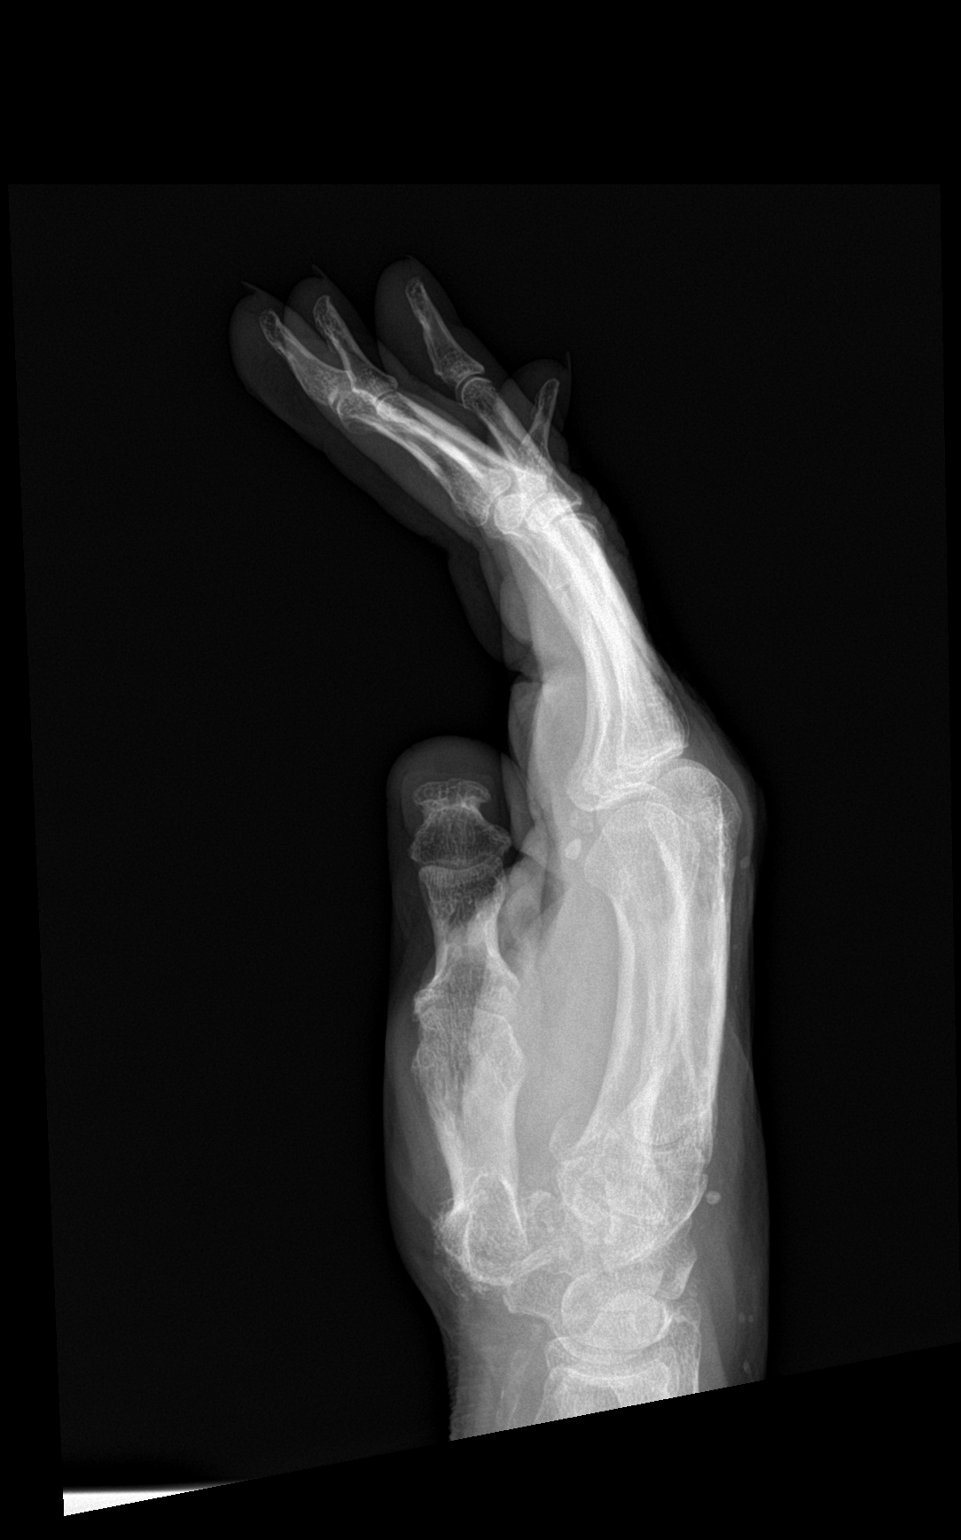

[2 of 2 positions shown; findings below may reference images not displayed]

FINDINGS: Generalized osteopenia.

No acute fracture or dislocation. Severe osteoarthritis of the first
CMC joint. Moderate osteoarthritis of the scaphotrapeziotrapezoid
joint. Mild osteoarthritis of the first MCP joint and first IP
joint. Chondrocalcinosis of the TFCC as can be seen with CPPD.
IMPRESSION: No acute osseous injury of the right hand.

## 2019-01-13 DIAGNOSIS — Z8673 Personal history of transient ischemic attack (TIA), and cerebral infarction without residual deficits: Secondary | ICD-10-CM | POA: Diagnosis not present

## 2019-01-13 DIAGNOSIS — M6281 Muscle weakness (generalized): Secondary | ICD-10-CM | POA: Diagnosis not present

## 2019-01-13 DIAGNOSIS — I10 Essential (primary) hypertension: Secondary | ICD-10-CM | POA: Diagnosis not present

## 2019-01-13 DIAGNOSIS — E119 Type 2 diabetes mellitus without complications: Secondary | ICD-10-CM | POA: Diagnosis not present

## 2019-01-16 ENCOUNTER — Telehealth: Payer: Self-pay

## 2019-01-16 NOTE — Telephone Encounter (Signed)
Left message on pt's vm that she is overdue for INR check after her recent hospitalization. Asked her to call and sched appt for drive thru next Mon or Wed between 9-12 in front of the [employer].

## 2019-01-22 DIAGNOSIS — M6281 Muscle weakness (generalized): Secondary | ICD-10-CM | POA: Diagnosis not present

## 2019-01-22 DIAGNOSIS — I10 Essential (primary) hypertension: Secondary | ICD-10-CM | POA: Diagnosis not present

## 2019-01-22 DIAGNOSIS — Z8673 Personal history of transient ischemic attack (TIA), and cerebral infarction without residual deficits: Secondary | ICD-10-CM | POA: Diagnosis not present

## 2019-01-22 DIAGNOSIS — E119 Type 2 diabetes mellitus without complications: Secondary | ICD-10-CM | POA: Diagnosis not present

## 2019-01-23 ENCOUNTER — Telehealth: Payer: Self-pay

## 2019-01-23 NOTE — Telephone Encounter (Signed)
Left message for pt to call and schedule INR check, as she was recently hospitalized w/ elevated INR (pt was taking double her coumadin dose) and was advised to check INR w/in a week of discharge. She was d/c'd from [facility] on 01/05/19 and has not scheduled an appt for INR check. Advised her that I am outside of the [place] entrance of [facility] on Mondays & Wednesdays from 9-12 and that she does not need to get out of her car for INR check. Asked her to please call and schedule appt for drive thru.

## 2019-01-28 NOTE — Telephone Encounter (Signed)
Spoke w/ pt's daughter, Santiago Glad. Advised her that I am calling b/c I have been unable to get in contact w/ her mother to schedule an INR check. She apologized, as pt has been moved to [facility] and she didn't think to call and let us know. She reports that pt was initially there for rehab, but they decided to move her there permanently this weekend. [facility] is managing pt's coumadin and monitoring her INR, so will remove her from our call list. Asked her to let us know if we can be of further assistance. She is appreciative of the call.

## 2019-02-01 DIAGNOSIS — E119 Type 2 diabetes mellitus without complications: Secondary | ICD-10-CM | POA: Diagnosis not present

## 2019-02-01 DIAGNOSIS — Z8673 Personal history of transient ischemic attack (TIA), and cerebral infarction without residual deficits: Secondary | ICD-10-CM | POA: Diagnosis not present

## 2019-02-01 DIAGNOSIS — M6281 Muscle weakness (generalized): Secondary | ICD-10-CM | POA: Diagnosis not present

## 2019-02-01 DIAGNOSIS — I10 Essential (primary) hypertension: Secondary | ICD-10-CM | POA: Diagnosis not present

## 2019-02-02 DIAGNOSIS — K5909 Other constipation: Secondary | ICD-10-CM | POA: Diagnosis not present

## 2019-02-02 DIAGNOSIS — R498 Other voice and resonance disorders: Secondary | ICD-10-CM | POA: Diagnosis not present

## 2019-02-02 DIAGNOSIS — A419 Sepsis, unspecified organism: Secondary | ICD-10-CM | POA: Diagnosis not present

## 2019-02-02 DIAGNOSIS — K219 Gastro-esophageal reflux disease without esophagitis: Secondary | ICD-10-CM | POA: Diagnosis not present

## 2019-02-02 DIAGNOSIS — I10 Essential (primary) hypertension: Secondary | ICD-10-CM | POA: Diagnosis not present

## 2019-02-02 DIAGNOSIS — E785 Hyperlipidemia, unspecified: Secondary | ICD-10-CM | POA: Diagnosis not present

## 2019-02-02 DIAGNOSIS — I482 Chronic atrial fibrillation, unspecified: Secondary | ICD-10-CM | POA: Diagnosis not present

## 2019-02-02 DIAGNOSIS — E119 Type 2 diabetes mellitus without complications: Secondary | ICD-10-CM | POA: Diagnosis not present

## 2019-02-02 DIAGNOSIS — N3000 Acute cystitis without hematuria: Secondary | ICD-10-CM | POA: Diagnosis not present

## 2019-02-03 DIAGNOSIS — N3000 Acute cystitis without hematuria: Secondary | ICD-10-CM | POA: Diagnosis not present

## 2019-02-03 DIAGNOSIS — I10 Essential (primary) hypertension: Secondary | ICD-10-CM | POA: Diagnosis not present

## 2019-02-03 DIAGNOSIS — I482 Chronic atrial fibrillation, unspecified: Secondary | ICD-10-CM | POA: Diagnosis not present

## 2019-02-03 DIAGNOSIS — A419 Sepsis, unspecified organism: Secondary | ICD-10-CM | POA: Diagnosis not present

## 2019-02-03 DIAGNOSIS — E785 Hyperlipidemia, unspecified: Secondary | ICD-10-CM | POA: Diagnosis not present

## 2019-02-03 DIAGNOSIS — K219 Gastro-esophageal reflux disease without esophagitis: Secondary | ICD-10-CM | POA: Diagnosis not present

## 2019-02-03 DIAGNOSIS — R498 Other voice and resonance disorders: Secondary | ICD-10-CM | POA: Diagnosis not present

## 2019-02-03 DIAGNOSIS — K5909 Other constipation: Secondary | ICD-10-CM | POA: Diagnosis not present

## 2019-02-03 DIAGNOSIS — E119 Type 2 diabetes mellitus without complications: Secondary | ICD-10-CM | POA: Diagnosis not present

## 2019-02-04 DIAGNOSIS — E785 Hyperlipidemia, unspecified: Secondary | ICD-10-CM | POA: Diagnosis not present

## 2019-02-04 DIAGNOSIS — A419 Sepsis, unspecified organism: Secondary | ICD-10-CM | POA: Diagnosis not present

## 2019-02-04 DIAGNOSIS — I4891 Unspecified atrial fibrillation: Secondary | ICD-10-CM | POA: Diagnosis not present

## 2019-02-04 DIAGNOSIS — I482 Chronic atrial fibrillation, unspecified: Secondary | ICD-10-CM | POA: Diagnosis not present

## 2019-02-04 DIAGNOSIS — K5909 Other constipation: Secondary | ICD-10-CM | POA: Diagnosis not present

## 2019-02-04 DIAGNOSIS — K219 Gastro-esophageal reflux disease without esophagitis: Secondary | ICD-10-CM | POA: Diagnosis not present

## 2019-02-04 DIAGNOSIS — E119 Type 2 diabetes mellitus without complications: Secondary | ICD-10-CM | POA: Diagnosis not present

## 2019-02-04 DIAGNOSIS — D649 Anemia, unspecified: Secondary | ICD-10-CM | POA: Diagnosis not present

## 2019-02-04 DIAGNOSIS — I10 Essential (primary) hypertension: Secondary | ICD-10-CM | POA: Diagnosis not present

## 2019-02-04 DIAGNOSIS — N3000 Acute cystitis without hematuria: Secondary | ICD-10-CM | POA: Diagnosis not present

## 2019-02-04 DIAGNOSIS — R498 Other voice and resonance disorders: Secondary | ICD-10-CM | POA: Diagnosis not present

## 2019-02-05 ENCOUNTER — Telehealth: Payer: Self-pay

## 2019-02-05 DIAGNOSIS — E119 Type 2 diabetes mellitus without complications: Secondary | ICD-10-CM | POA: Diagnosis not present

## 2019-02-05 DIAGNOSIS — K219 Gastro-esophageal reflux disease without esophagitis: Secondary | ICD-10-CM | POA: Diagnosis not present

## 2019-02-05 DIAGNOSIS — A419 Sepsis, unspecified organism: Secondary | ICD-10-CM | POA: Diagnosis not present

## 2019-02-05 DIAGNOSIS — I482 Chronic atrial fibrillation, unspecified: Secondary | ICD-10-CM | POA: Diagnosis not present

## 2019-02-05 DIAGNOSIS — D649 Anemia, unspecified: Secondary | ICD-10-CM | POA: Diagnosis not present

## 2019-02-05 DIAGNOSIS — R498 Other voice and resonance disorders: Secondary | ICD-10-CM | POA: Diagnosis not present

## 2019-02-05 DIAGNOSIS — I4891 Unspecified atrial fibrillation: Secondary | ICD-10-CM | POA: Diagnosis not present

## 2019-02-05 DIAGNOSIS — E785 Hyperlipidemia, unspecified: Secondary | ICD-10-CM | POA: Diagnosis not present

## 2019-02-05 DIAGNOSIS — N3000 Acute cystitis without hematuria: Secondary | ICD-10-CM | POA: Diagnosis not present

## 2019-02-05 DIAGNOSIS — K5909 Other constipation: Secondary | ICD-10-CM | POA: Diagnosis not present

## 2019-02-05 DIAGNOSIS — I10 Essential (primary) hypertension: Secondary | ICD-10-CM | POA: Diagnosis not present

## 2019-02-05 NOTE — Telephone Encounter (Signed)

## 2019-02-06 ENCOUNTER — Telehealth (INDEPENDENT_AMBULATORY_CARE_PROVIDER_SITE_OTHER): Payer: Medicare Other | Admitting: Cardiovascular Disease

## 2019-02-06 ENCOUNTER — Other Ambulatory Visit: Payer: Self-pay

## 2019-02-06 ENCOUNTER — Encounter: Payer: Self-pay | Admitting: Cardiovascular Disease

## 2019-02-06 VITALS — BP 190/78 | HR 64 | Ht 64.0 in | Wt 285.0 lb

## 2019-02-06 DIAGNOSIS — K219 Gastro-esophageal reflux disease without esophagitis: Secondary | ICD-10-CM | POA: Diagnosis not present

## 2019-02-06 DIAGNOSIS — I5032 Chronic diastolic (congestive) heart failure: Secondary | ICD-10-CM | POA: Diagnosis not present

## 2019-02-06 DIAGNOSIS — A419 Sepsis, unspecified organism: Secondary | ICD-10-CM | POA: Diagnosis not present

## 2019-02-06 DIAGNOSIS — K5909 Other constipation: Secondary | ICD-10-CM | POA: Diagnosis not present

## 2019-02-06 DIAGNOSIS — I10 Essential (primary) hypertension: Secondary | ICD-10-CM | POA: Diagnosis not present

## 2019-02-06 DIAGNOSIS — E785 Hyperlipidemia, unspecified: Secondary | ICD-10-CM | POA: Diagnosis not present

## 2019-02-06 DIAGNOSIS — N3000 Acute cystitis without hematuria: Secondary | ICD-10-CM | POA: Diagnosis not present

## 2019-02-06 DIAGNOSIS — R498 Other voice and resonance disorders: Secondary | ICD-10-CM | POA: Diagnosis not present

## 2019-02-06 DIAGNOSIS — E119 Type 2 diabetes mellitus without complications: Secondary | ICD-10-CM | POA: Diagnosis not present

## 2019-02-06 DIAGNOSIS — I482 Chronic atrial fibrillation, unspecified: Secondary | ICD-10-CM | POA: Diagnosis not present

## 2019-02-06 MED ORDER — AMLODIPINE BESYLATE 5 MG PO TABS: 5.0000 mg | ORAL_TABLET | Freq: Every day | ORAL | 3 refills | Status: DC

## 2019-02-06 NOTE — Progress Notes (Signed)
Virtual Visit via Video Note   This visit type was conducted due to national recommendations for restrictions regarding the COVID-19 Pandemic (e.g. social distancing) in an effort to limit this patient's exposure and mitigate transmission in our community.  Due to her co-morbid illnesses, this patient is at least at moderate risk for complications without adequate follow up.  This format is felt to be most appropriate for this patient at this time.  All issues noted in this document were discussed and addressed.  A limited physical exam was performed with this format.  Please refer to the patient's chart for her consent to telehealth for [facility].   Date:  02/06/2019   ID:  Megan Obrien, DOB 1946-03-19, MRN [id number]  Patient Location: [place] Provider Location: Office  PCP:  Pleas Koch, NP  Cardiologist:  [name], MD  Electrophysiologist:  None   Evaluation Performed:  Follow-Up Visit  Chief Complaint: Weakness and elevated blood pressure  History of Present Illness:    Megan Obrien is a 73 y.o. female who was seen via video visit for follow-up regarding chronic diastolic heart failure, pulmonary hypertension, left atrial appendage thrombus with presumed paroxysmal atrial fibrillation and mitral stenosis. She has known history of type 2 diabetes, morbid obesity, hyperlipidemia, stroke, chronic kidney disease, sleep apnea and essential hypertension.  Left cardiac catheterization in July 2018 showed mild nonobstructive coronary artery disease. Right heart catheterization showed moderately elevated filling pressures, severe pulmonary hypertension and normal cardiac output. Mean PA pressure was 59 mmHg and pulmonary capillary wedge pressure was 31 mmHg with an LVEDP of 19 mmHg. Mitral stenosis was mild with a mean gradient of 8 mmHg and valve area of 2.02 cm.   She is currently at [facility] rehab and might have to stay there long-term given  her poor functional capacity.  She has some worsening leg edema and the dose of furosemide was changed to twice daily.  Also her blood pressure has been running high in spite of increasing carvedilol.  She denies chest pain, she has stable exertional dyspnea.        Past Medical History:  Diagnosis       The patient does not have symptoms concerning for COVID-19 infection (fever, chills, cough, or new shortness of breath).    Past Medical History:  Diagnosis Date  . (HFpEF) heart failure with preserved ejection fraction ([place])    a. 03/2017 Echo: EF 55-60%, no rwma, Gr1 DD, mild to mod MS, mod to sev dil LA.  [name] Arthritis   . Carotid arterial disease ([place])    a. 11/2017 CTA Head/Neck: RICA 45, LICA 60.  [name] Cerebrovascular disease    a. 11/2017 MRA: mod to sev stenosis of bilat PCA's P2 segments; b. 11/2017 CTA Head/Neck: RICA 45, LICA 60, sev bilat distal MCA branch stenoses w/ sev bilat P2 stenoses.  . CKD (chronic kidney disease), stage III ([place])   . Depression   . GERD (gastroesophageal reflux disease)   . Hyperlipidemia   . Hypertension   . Left Atrial Appendage Thrombus    a. 11/2017 TEE (performed in setting of stroke/aphasia): nl EF, mod MS, dil LA w/ heavy smoke and small LAA thrombus-->Warfarin initiated.  . Mitral stenosis    a. Mild to moderate by echo 03/2017; b. 03/2017 R/LHC: PCWP 8mmHg, LVEDP 69mmHg. Mean grad of 40mmHg. Valve area of 2.02cm^2; c. 11/2017 TEE: Mod MS.  . Morbid obesity ([place])   . Murmur   . Non-obstructive  CAD (coronary artery disease)    a. 03/2017 Cath: mild, non-obstructive CAD.  [name] OSA (obstructive sleep apnea)    a. Previously wore CPAP for several years but stopped on her own and now just uses O2 via [place] @ night.  [name] PAH (pulmonary artery hypertension) ([place])    a. 03/2017 RHC: PA 43mmHg.  . Seasonal allergies   . Stroke [facility])    a. 11/2017 - presented w/ aphasia->MRI acute to early subacute ischemia w/in multiple vascular territories, largest in R frontal  & L parietal lobes. Smaller foci of ischemia w/in R hippocampus, R parietal lobe, and L peripheral postcentral gyrus.  . Type 2 diabetes mellitus ([place])   . Urinary incontinence    Past Surgical History:  Procedure Laterality Date  . BACK SURGERY    . CARDIAC CATHETERIZATION    . KNEE SURGERY Left   . RIGHT/LEFT HEART CATH AND CORONARY ANGIOGRAPHY Bilateral 04/25/2017   Procedure: Right/Left Heart Cath and Coronary Angiography;  Surgeon: Wellington Hampshire, MD;  Location: [place] CV LAB;  Service: Cardiovascular;  Laterality: Bilateral;  . TEE WITHOUT CARDIOVERSION N/A 12/20/2017   Procedure: TRANSESOPHAGEAL ECHOCARDIOGRAM (TEE);  Surgeon: Wellington Hampshire, MD;  Location: [facility] ORS;  Service: Cardiovascular;  Laterality: N/A;  . TONSILLECTOMY AND ADENOIDECTOMY  1951     Current Meds  Medication Sig  . acetaminophen (TYLENOL) 650 MG CR tablet Take 650 mg by mouth every 8 (eight) hours as needed for pain.  [name] atorvastatin (LIPITOR) 40 MG tablet Take 1 tablet (40 mg total) by mouth at bedtime.  . carvedilol (COREG) 12.5 MG tablet Take 37.5 mg by mouth 2 (two) times daily with a meal.  . diclofenac sodium (VOLTAREN) 1 % GEL Apply topically 2 (two) times a day.  . docusate sodium (COLACE) 100 MG capsule Take 1 capsule (100 mg total) by mouth 2 (two) times daily.  [name] FLUoxetine (PROZAC) 40 MG capsule Take 1 capsule by mouth once daily for depression and anxiety.  . furosemide (LASIX) 20 MG tablet Take 1 tablet (20 mg total) by mouth daily. (Patient taking differently: Take 20 mg by mouth 2 (two) times daily. )  . glimepiride (AMARYL) 2 MG tablet Take 2 mg by mouth daily with breakfast.  . HYDROcodone-acetaminophen (NORCO/VICODIN) 5-325 MG tablet Take 1 tablet by mouth every 6 (six) hours as needed for moderate pain.  . Insulin Glargine (LANTUS) 100 UNIT/ML Solostar Pen Inject 15 Units into the skin every evening. (Patient taking differently: Inject 2 Units into the skin daily. )  . lisinopril  (ZESTRIL) 20 MG tablet Take 20 mg by mouth 2 (two) times a day.  . omega-3 acid ethyl esters (LOVAZA) 1 g capsule TAKE 1 CAPSULE BY MOUTH ONCE DAILY WITH FOOD FOR CHOLESTEROL  . omeprazole (PRILOSEC) 40 MG capsule Take 1 capsule (40 mg total) by mouth daily. (Patient taking differently: Take 40 mg by mouth 2 (two) times a day. )  . sucralfate (CARAFATE) 1 g tablet Take 1 g by mouth 4 (four) times daily -  with meals and at bedtime.  [name] warfarin (COUMADIN) 5 MG tablet TAKE ONE TABLET BY MOUTH AS DIRECTED BY THE COUMADIN CLINIC (Patient taking differently: Take 5 mg by mouth. Take 5 mg on Mondays, Wednesday, Friday, Sunday. Take 2.5 mg on Tuesday, Thursday, and Saturday)     Allergies:   Patient has no known allergies.   Social History   Tobacco Use  . Smoking status: Never Smoker  . Smokeless tobacco: Never Used  Substance Use Topics  . Alcohol use: No    Alcohol/week: 0.0 standard drinks    Comment: rarely  . Drug use: No     Family Hx: The patient's family history includes Alzheimer's disease in her mother; Lung cancer in her father; Stroke in her maternal grandfather.  ROS:   Please see the history of present illness.     All other systems reviewed and are negative.   Prior CV studies:   The following studies were reviewed today:    Labs/Other Tests and Data Reviewed:    EKG:  No ECG reviewed.  Recent Labs: 11/24/2018: BNP 257.7 01/01/2019: ALT 26; TSH 4.380 01/03/2019: Hemoglobin 7.7; Magnesium 1.9; Platelets 226 01/04/2019: BUN 42; Creatinine, Ser 1.86; Potassium 4.0; Sodium 140   Recent Lipid Panel Lab Results  Component Value Date/Time   CHOL 139 02/06/2018 11:45 AM   TRIG 148.0 02/06/2018 11:45 AM   HDL 41.10 02/06/2018 11:45 AM   CHOLHDL 3 02/06/2018 11:45 AM   LDLCALC 68 02/06/2018 11:45 AM    Wt Readings from Last 3 Encounters:  02/06/19 285 lb (129.3 kg)  01/04/19 (!) 316 lb (143.3 kg)  11/24/18 285 lb 12 oz (129.6 kg)     Objective:    Vital Signs:   BP (!) 190/78   Pulse 64   Ht 5\' 4"  (1.626 m)   Wt 285 lb (129.3 kg)   BMI 48.92 kg/m    VITAL SIGNS:  reviewed GEN:  no acute distress EYES:  sclerae anicteric, EOMI - Extraocular Movements Intact RESPIRATORY:  normal respiratory effort, symmetric expansion CARDIOVASCULAR:  lower extremity edema noted SKIN:  no rash, lesions or ulcers. MUSCULOSKELETAL:  no obvious deformities. NEURO:  alert and oriented x 3, no obvious focal deficit PSYCH:  normal affect  ASSESSMENT & PLAN:    1. Nonobstructive coronary artery disease: Currently with no chest pain or anginal symptoms.  Continue medical therapy 2. Presumed paroxysmal atrial fibrillation with left atrial appendage thrombus: This was detected as part of stroke work-up in 2019.  Continue anticoagulation with warfarin.  3. Moderate mitral stenosis: This was moderate on most recent evaluation.  Given poor functional capacity, I doubt that she will be a candidate for any valve surgery in the future 4. Chronic diastolic heart failure: Recent volume overload and the dose of furosemide was increased by Dr. Lovie Macadamia 5. Essential hypertension: Her blood pressure has been running high and thus I recommend adding amlodipine 5 mg once daily 6. Hyperlipidemia: Continue atorvastatin  COVID-19 Education: The signs and symptoms of COVID-19 were discussed with the patient and how to seek care for testing (follow up with PCP or arrange E-visit).  The importance of social distancing was discussed today.  Time:   Today, I have spent 22 minutes with the patient with telehealth technology discussing the above problems.     Medication Adjustments/Labs and Tests Ordered: Current medicines are reviewed at length with the patient today.  Concerns regarding medicines are outlined above.   Tests Ordered: No orders of the defined types were placed in this encounter.   Medication Changes: No orders of the defined types were placed in this encounter.    Disposition:  Follow up in 3 month(s)  Signed, [name], MD  02/06/2019 1:42 PM    [facility]

## 2019-02-06 NOTE — Addendum Note (Signed)
Addended by: Vanessa Ralphs on: 02/06/2019 02:40 PM   Modules accepted: Orders

## 2019-02-06 NOTE — Patient Instructions (Signed)
Medication Instructions:  Add amlodipine 5 mg once daily If you need a refill on your cardiac medications before your next appointment, please call your pharmacy.   Lab work: None If you have labs (blood work) drawn today and your tests are completely normal, you will receive your results only by: [name] MyChart Message (if you have MyChart) OR . A paper copy in the mail If you have any lab test that is abnormal or we need to change your treatment, we will call you to review the results.  Testing/Procedures: None  Follow-Up: At [facility], you and your health needs are our priority.  As part of our continuing mission to provide you with exceptional heart care, we have created designated Provider Care Teams.  These Care Teams include your primary Cardiologist (physician) and Advanced Practice Providers (APPs -  Physician Assistants and Nurse Practitioners) who all work together to provide you with the care you need, when you need it. You will need a follow up appointment in 3 months.  Please call our office 2 months in advance to schedule this appointment.  You may see [name], MD or one of the following Advanced Practice Providers on your designated Care Team:   Murray Hodgkins, NP [name], PA-C . Marrianne Mood, PA-C

## 2019-02-07 DIAGNOSIS — Z7901 Long term (current) use of anticoagulants: Secondary | ICD-10-CM | POA: Diagnosis not present

## 2019-02-07 DIAGNOSIS — I4891 Unspecified atrial fibrillation: Secondary | ICD-10-CM | POA: Diagnosis not present

## 2019-02-08 ENCOUNTER — Other Ambulatory Visit
Admission: RE | Admit: 2019-02-08 | Discharge: 2019-02-08 | Disposition: A | Discharge status: Home / Self Care | Payer: Medicare Other | Source: Ambulatory Visit | Attending: Family Medicine | Admitting: Family Medicine

## 2019-02-08 DIAGNOSIS — I10 Essential (primary) hypertension: Secondary | ICD-10-CM | POA: Diagnosis not present

## 2019-02-08 DIAGNOSIS — A419 Sepsis, unspecified organism: Secondary | ICD-10-CM | POA: Diagnosis not present

## 2019-02-08 DIAGNOSIS — R498 Other voice and resonance disorders: Secondary | ICD-10-CM | POA: Diagnosis not present

## 2019-02-08 DIAGNOSIS — Z1159 Encounter for screening for other viral diseases: Secondary | ICD-10-CM | POA: Diagnosis not present

## 2019-02-08 DIAGNOSIS — N3000 Acute cystitis without hematuria: Secondary | ICD-10-CM | POA: Diagnosis not present

## 2019-02-08 DIAGNOSIS — I482 Chronic atrial fibrillation, unspecified: Secondary | ICD-10-CM | POA: Diagnosis not present

## 2019-02-08 DIAGNOSIS — K219 Gastro-esophageal reflux disease without esophagitis: Secondary | ICD-10-CM | POA: Diagnosis not present

## 2019-02-08 DIAGNOSIS — K5909 Other constipation: Secondary | ICD-10-CM | POA: Diagnosis not present

## 2019-02-08 DIAGNOSIS — E785 Hyperlipidemia, unspecified: Secondary | ICD-10-CM | POA: Diagnosis not present

## 2019-02-08 DIAGNOSIS — E119 Type 2 diabetes mellitus without complications: Secondary | ICD-10-CM | POA: Diagnosis not present

## 2019-02-08 LAB — SARS CORONAVIRUS 2 BY RT PCR (HOSPITAL ORDER, PERFORMED IN ~~LOC~~ HOSPITAL LAB): SARS Coronavirus 2: NEGATIVE

## 2019-02-09 DIAGNOSIS — E119 Type 2 diabetes mellitus without complications: Secondary | ICD-10-CM | POA: Diagnosis not present

## 2019-02-09 DIAGNOSIS — I482 Chronic atrial fibrillation, unspecified: Secondary | ICD-10-CM | POA: Diagnosis not present

## 2019-02-09 DIAGNOSIS — Z7901 Long term (current) use of anticoagulants: Secondary | ICD-10-CM | POA: Diagnosis not present

## 2019-02-09 DIAGNOSIS — K219 Gastro-esophageal reflux disease without esophagitis: Secondary | ICD-10-CM | POA: Diagnosis not present

## 2019-02-09 DIAGNOSIS — A419 Sepsis, unspecified organism: Secondary | ICD-10-CM | POA: Diagnosis not present

## 2019-02-09 DIAGNOSIS — I10 Essential (primary) hypertension: Secondary | ICD-10-CM | POA: Diagnosis not present

## 2019-02-09 DIAGNOSIS — R498 Other voice and resonance disorders: Secondary | ICD-10-CM | POA: Diagnosis not present

## 2019-02-09 DIAGNOSIS — I4891 Unspecified atrial fibrillation: Secondary | ICD-10-CM | POA: Diagnosis not present

## 2019-02-09 DIAGNOSIS — K5909 Other constipation: Secondary | ICD-10-CM | POA: Diagnosis not present

## 2019-02-09 DIAGNOSIS — E785 Hyperlipidemia, unspecified: Secondary | ICD-10-CM | POA: Diagnosis not present

## 2019-02-09 DIAGNOSIS — N3000 Acute cystitis without hematuria: Secondary | ICD-10-CM | POA: Diagnosis not present

## 2019-02-10 DIAGNOSIS — E785 Hyperlipidemia, unspecified: Secondary | ICD-10-CM | POA: Diagnosis not present

## 2019-02-10 DIAGNOSIS — E119 Type 2 diabetes mellitus without complications: Secondary | ICD-10-CM | POA: Diagnosis not present

## 2019-02-10 DIAGNOSIS — A419 Sepsis, unspecified organism: Secondary | ICD-10-CM | POA: Diagnosis not present

## 2019-02-10 DIAGNOSIS — K219 Gastro-esophageal reflux disease without esophagitis: Secondary | ICD-10-CM | POA: Diagnosis not present

## 2019-02-10 DIAGNOSIS — N3000 Acute cystitis without hematuria: Secondary | ICD-10-CM | POA: Diagnosis not present

## 2019-02-10 DIAGNOSIS — I482 Chronic atrial fibrillation, unspecified: Secondary | ICD-10-CM | POA: Diagnosis not present

## 2019-02-10 DIAGNOSIS — K5909 Other constipation: Secondary | ICD-10-CM | POA: Diagnosis not present

## 2019-02-10 DIAGNOSIS — R498 Other voice and resonance disorders: Secondary | ICD-10-CM | POA: Diagnosis not present

## 2019-02-10 DIAGNOSIS — I10 Essential (primary) hypertension: Secondary | ICD-10-CM | POA: Diagnosis not present

## 2019-02-11 DIAGNOSIS — E785 Hyperlipidemia, unspecified: Secondary | ICD-10-CM | POA: Diagnosis not present

## 2019-02-11 DIAGNOSIS — K5909 Other constipation: Secondary | ICD-10-CM | POA: Diagnosis not present

## 2019-02-11 DIAGNOSIS — R498 Other voice and resonance disorders: Secondary | ICD-10-CM | POA: Diagnosis not present

## 2019-02-11 DIAGNOSIS — N3000 Acute cystitis without hematuria: Secondary | ICD-10-CM | POA: Diagnosis not present

## 2019-02-11 DIAGNOSIS — I482 Chronic atrial fibrillation, unspecified: Secondary | ICD-10-CM | POA: Diagnosis not present

## 2019-02-11 DIAGNOSIS — Z1159 Encounter for screening for other viral diseases: Secondary | ICD-10-CM | POA: Diagnosis not present

## 2019-02-11 DIAGNOSIS — A419 Sepsis, unspecified organism: Secondary | ICD-10-CM | POA: Diagnosis not present

## 2019-02-11 DIAGNOSIS — I10 Essential (primary) hypertension: Secondary | ICD-10-CM | POA: Diagnosis not present

## 2019-02-11 DIAGNOSIS — K219 Gastro-esophageal reflux disease without esophagitis: Secondary | ICD-10-CM | POA: Diagnosis not present

## 2019-02-11 DIAGNOSIS — E119 Type 2 diabetes mellitus without complications: Secondary | ICD-10-CM | POA: Diagnosis not present

## 2019-02-12 DIAGNOSIS — I10 Essential (primary) hypertension: Secondary | ICD-10-CM | POA: Diagnosis not present

## 2019-02-12 DIAGNOSIS — E785 Hyperlipidemia, unspecified: Secondary | ICD-10-CM | POA: Diagnosis not present

## 2019-02-12 DIAGNOSIS — A419 Sepsis, unspecified organism: Secondary | ICD-10-CM | POA: Diagnosis not present

## 2019-02-12 DIAGNOSIS — R498 Other voice and resonance disorders: Secondary | ICD-10-CM | POA: Diagnosis not present

## 2019-02-12 DIAGNOSIS — E119 Type 2 diabetes mellitus without complications: Secondary | ICD-10-CM | POA: Diagnosis not present

## 2019-02-12 DIAGNOSIS — I482 Chronic atrial fibrillation, unspecified: Secondary | ICD-10-CM | POA: Diagnosis not present

## 2019-02-12 DIAGNOSIS — K219 Gastro-esophageal reflux disease without esophagitis: Secondary | ICD-10-CM | POA: Diagnosis not present

## 2019-02-12 DIAGNOSIS — N3000 Acute cystitis without hematuria: Secondary | ICD-10-CM | POA: Diagnosis not present

## 2019-02-12 DIAGNOSIS — K5909 Other constipation: Secondary | ICD-10-CM | POA: Diagnosis not present

## 2019-02-13 DIAGNOSIS — K5909 Other constipation: Secondary | ICD-10-CM | POA: Diagnosis not present

## 2019-02-13 DIAGNOSIS — R498 Other voice and resonance disorders: Secondary | ICD-10-CM | POA: Diagnosis not present

## 2019-02-13 DIAGNOSIS — A419 Sepsis, unspecified organism: Secondary | ICD-10-CM | POA: Diagnosis not present

## 2019-02-13 DIAGNOSIS — I10 Essential (primary) hypertension: Secondary | ICD-10-CM | POA: Diagnosis not present

## 2019-02-13 DIAGNOSIS — N3000 Acute cystitis without hematuria: Secondary | ICD-10-CM | POA: Diagnosis not present

## 2019-02-13 DIAGNOSIS — I482 Chronic atrial fibrillation, unspecified: Secondary | ICD-10-CM | POA: Diagnosis not present

## 2019-02-13 DIAGNOSIS — E785 Hyperlipidemia, unspecified: Secondary | ICD-10-CM | POA: Diagnosis not present

## 2019-02-13 DIAGNOSIS — K219 Gastro-esophageal reflux disease without esophagitis: Secondary | ICD-10-CM | POA: Diagnosis not present

## 2019-02-13 DIAGNOSIS — E119 Type 2 diabetes mellitus without complications: Secondary | ICD-10-CM | POA: Diagnosis not present

## 2019-02-14 DIAGNOSIS — I482 Chronic atrial fibrillation, unspecified: Secondary | ICD-10-CM | POA: Diagnosis not present

## 2019-02-15 DIAGNOSIS — Z8673 Personal history of transient ischemic attack (TIA), and cerebral infarction without residual deficits: Secondary | ICD-10-CM | POA: Diagnosis not present

## 2019-02-15 DIAGNOSIS — M6281 Muscle weakness (generalized): Secondary | ICD-10-CM | POA: Diagnosis not present

## 2019-02-15 DIAGNOSIS — E119 Type 2 diabetes mellitus without complications: Secondary | ICD-10-CM | POA: Diagnosis not present

## 2019-02-15 DIAGNOSIS — I10 Essential (primary) hypertension: Secondary | ICD-10-CM | POA: Diagnosis not present

## 2019-02-16 DIAGNOSIS — N3000 Acute cystitis without hematuria: Secondary | ICD-10-CM | POA: Diagnosis not present

## 2019-02-16 DIAGNOSIS — E119 Type 2 diabetes mellitus without complications: Secondary | ICD-10-CM | POA: Diagnosis not present

## 2019-02-16 DIAGNOSIS — K219 Gastro-esophageal reflux disease without esophagitis: Secondary | ICD-10-CM | POA: Diagnosis not present

## 2019-02-16 DIAGNOSIS — E785 Hyperlipidemia, unspecified: Secondary | ICD-10-CM | POA: Diagnosis not present

## 2019-02-16 DIAGNOSIS — R498 Other voice and resonance disorders: Secondary | ICD-10-CM | POA: Diagnosis not present

## 2019-02-16 DIAGNOSIS — K5909 Other constipation: Secondary | ICD-10-CM | POA: Diagnosis not present

## 2019-02-16 DIAGNOSIS — I482 Chronic atrial fibrillation, unspecified: Secondary | ICD-10-CM | POA: Diagnosis not present

## 2019-02-16 DIAGNOSIS — A419 Sepsis, unspecified organism: Secondary | ICD-10-CM | POA: Diagnosis not present

## 2019-02-16 DIAGNOSIS — I10 Essential (primary) hypertension: Secondary | ICD-10-CM | POA: Diagnosis not present

## 2019-02-17 ENCOUNTER — Other Ambulatory Visit: Payer: Self-pay

## 2019-02-17 DIAGNOSIS — I10 Essential (primary) hypertension: Secondary | ICD-10-CM | POA: Diagnosis not present

## 2019-02-17 DIAGNOSIS — K219 Gastro-esophageal reflux disease without esophagitis: Secondary | ICD-10-CM | POA: Diagnosis not present

## 2019-02-17 DIAGNOSIS — R498 Other voice and resonance disorders: Secondary | ICD-10-CM | POA: Diagnosis not present

## 2019-02-17 DIAGNOSIS — E119 Type 2 diabetes mellitus without complications: Secondary | ICD-10-CM | POA: Diagnosis not present

## 2019-02-17 DIAGNOSIS — E785 Hyperlipidemia, unspecified: Secondary | ICD-10-CM | POA: Diagnosis not present

## 2019-02-17 DIAGNOSIS — K5909 Other constipation: Secondary | ICD-10-CM | POA: Diagnosis not present

## 2019-02-17 DIAGNOSIS — I482 Chronic atrial fibrillation, unspecified: Secondary | ICD-10-CM | POA: Diagnosis not present

## 2019-02-17 DIAGNOSIS — A419 Sepsis, unspecified organism: Secondary | ICD-10-CM | POA: Diagnosis not present

## 2019-02-17 DIAGNOSIS — N3000 Acute cystitis without hematuria: Secondary | ICD-10-CM | POA: Diagnosis not present

## 2019-02-17 NOTE — Telephone Encounter (Signed)
Unable to reach network issues

## 2019-02-18 DIAGNOSIS — I10 Essential (primary) hypertension: Secondary | ICD-10-CM | POA: Diagnosis not present

## 2019-02-18 DIAGNOSIS — E785 Hyperlipidemia, unspecified: Secondary | ICD-10-CM | POA: Diagnosis not present

## 2019-02-18 DIAGNOSIS — I482 Chronic atrial fibrillation, unspecified: Secondary | ICD-10-CM | POA: Diagnosis not present

## 2019-02-18 DIAGNOSIS — K5909 Other constipation: Secondary | ICD-10-CM | POA: Diagnosis not present

## 2019-02-18 DIAGNOSIS — A419 Sepsis, unspecified organism: Secondary | ICD-10-CM | POA: Diagnosis not present

## 2019-02-18 DIAGNOSIS — K219 Gastro-esophageal reflux disease without esophagitis: Secondary | ICD-10-CM | POA: Diagnosis not present

## 2019-02-18 DIAGNOSIS — R498 Other voice and resonance disorders: Secondary | ICD-10-CM | POA: Diagnosis not present

## 2019-02-18 DIAGNOSIS — E119 Type 2 diabetes mellitus without complications: Secondary | ICD-10-CM | POA: Diagnosis not present

## 2019-02-18 DIAGNOSIS — N3000 Acute cystitis without hematuria: Secondary | ICD-10-CM | POA: Diagnosis not present

## 2019-02-19 DIAGNOSIS — N3000 Acute cystitis without hematuria: Secondary | ICD-10-CM | POA: Diagnosis not present

## 2019-02-19 DIAGNOSIS — R498 Other voice and resonance disorders: Secondary | ICD-10-CM | POA: Diagnosis not present

## 2019-02-19 DIAGNOSIS — A419 Sepsis, unspecified organism: Secondary | ICD-10-CM | POA: Diagnosis not present

## 2019-02-19 DIAGNOSIS — K219 Gastro-esophageal reflux disease without esophagitis: Secondary | ICD-10-CM | POA: Diagnosis not present

## 2019-02-19 DIAGNOSIS — K5909 Other constipation: Secondary | ICD-10-CM | POA: Diagnosis not present

## 2019-02-19 DIAGNOSIS — E785 Hyperlipidemia, unspecified: Secondary | ICD-10-CM | POA: Diagnosis not present

## 2019-02-19 DIAGNOSIS — I482 Chronic atrial fibrillation, unspecified: Secondary | ICD-10-CM | POA: Diagnosis not present

## 2019-02-19 DIAGNOSIS — I10 Essential (primary) hypertension: Secondary | ICD-10-CM | POA: Diagnosis not present

## 2019-02-19 DIAGNOSIS — E119 Type 2 diabetes mellitus without complications: Secondary | ICD-10-CM | POA: Diagnosis not present

## 2019-02-20 DIAGNOSIS — I10 Essential (primary) hypertension: Secondary | ICD-10-CM | POA: Diagnosis not present

## 2019-02-20 DIAGNOSIS — K5909 Other constipation: Secondary | ICD-10-CM | POA: Diagnosis not present

## 2019-02-20 DIAGNOSIS — N3000 Acute cystitis without hematuria: Secondary | ICD-10-CM | POA: Diagnosis not present

## 2019-02-20 DIAGNOSIS — A419 Sepsis, unspecified organism: Secondary | ICD-10-CM | POA: Diagnosis not present

## 2019-02-20 DIAGNOSIS — E119 Type 2 diabetes mellitus without complications: Secondary | ICD-10-CM | POA: Diagnosis not present

## 2019-02-20 DIAGNOSIS — E785 Hyperlipidemia, unspecified: Secondary | ICD-10-CM | POA: Diagnosis not present

## 2019-02-20 DIAGNOSIS — R498 Other voice and resonance disorders: Secondary | ICD-10-CM | POA: Diagnosis not present

## 2019-02-20 DIAGNOSIS — I482 Chronic atrial fibrillation, unspecified: Secondary | ICD-10-CM | POA: Diagnosis not present

## 2019-02-20 DIAGNOSIS — K219 Gastro-esophageal reflux disease without esophagitis: Secondary | ICD-10-CM | POA: Diagnosis not present

## 2019-02-23 DIAGNOSIS — A419 Sepsis, unspecified organism: Secondary | ICD-10-CM | POA: Diagnosis not present

## 2019-02-23 DIAGNOSIS — N3000 Acute cystitis without hematuria: Secondary | ICD-10-CM | POA: Diagnosis not present

## 2019-02-23 DIAGNOSIS — K219 Gastro-esophageal reflux disease without esophagitis: Secondary | ICD-10-CM | POA: Diagnosis not present

## 2019-02-23 DIAGNOSIS — K5909 Other constipation: Secondary | ICD-10-CM | POA: Diagnosis not present

## 2019-02-23 DIAGNOSIS — I482 Chronic atrial fibrillation, unspecified: Secondary | ICD-10-CM | POA: Diagnosis not present

## 2019-02-23 DIAGNOSIS — E119 Type 2 diabetes mellitus without complications: Secondary | ICD-10-CM | POA: Diagnosis not present

## 2019-02-23 DIAGNOSIS — R498 Other voice and resonance disorders: Secondary | ICD-10-CM | POA: Diagnosis not present

## 2019-02-23 DIAGNOSIS — I10 Essential (primary) hypertension: Secondary | ICD-10-CM | POA: Diagnosis not present

## 2019-02-23 DIAGNOSIS — E785 Hyperlipidemia, unspecified: Secondary | ICD-10-CM | POA: Diagnosis not present

## 2019-02-24 DIAGNOSIS — I482 Chronic atrial fibrillation, unspecified: Secondary | ICD-10-CM | POA: Diagnosis not present

## 2019-02-24 DIAGNOSIS — E119 Type 2 diabetes mellitus without complications: Secondary | ICD-10-CM | POA: Diagnosis not present

## 2019-02-24 DIAGNOSIS — N3000 Acute cystitis without hematuria: Secondary | ICD-10-CM | POA: Diagnosis not present

## 2019-02-24 DIAGNOSIS — K219 Gastro-esophageal reflux disease without esophagitis: Secondary | ICD-10-CM | POA: Diagnosis not present

## 2019-02-24 DIAGNOSIS — A419 Sepsis, unspecified organism: Secondary | ICD-10-CM | POA: Diagnosis not present

## 2019-02-24 DIAGNOSIS — K5909 Other constipation: Secondary | ICD-10-CM | POA: Diagnosis not present

## 2019-02-24 DIAGNOSIS — R498 Other voice and resonance disorders: Secondary | ICD-10-CM | POA: Diagnosis not present

## 2019-02-24 DIAGNOSIS — E785 Hyperlipidemia, unspecified: Secondary | ICD-10-CM | POA: Diagnosis not present

## 2019-02-24 DIAGNOSIS — I10 Essential (primary) hypertension: Secondary | ICD-10-CM | POA: Diagnosis not present

## 2019-02-25 DIAGNOSIS — I482 Chronic atrial fibrillation, unspecified: Secondary | ICD-10-CM | POA: Diagnosis not present

## 2019-02-25 DIAGNOSIS — N3000 Acute cystitis without hematuria: Secondary | ICD-10-CM | POA: Diagnosis not present

## 2019-02-25 DIAGNOSIS — K5909 Other constipation: Secondary | ICD-10-CM | POA: Diagnosis not present

## 2019-02-25 DIAGNOSIS — K219 Gastro-esophageal reflux disease without esophagitis: Secondary | ICD-10-CM | POA: Diagnosis not present

## 2019-02-25 DIAGNOSIS — I10 Essential (primary) hypertension: Secondary | ICD-10-CM | POA: Diagnosis not present

## 2019-02-25 DIAGNOSIS — E119 Type 2 diabetes mellitus without complications: Secondary | ICD-10-CM | POA: Diagnosis not present

## 2019-02-25 DIAGNOSIS — R498 Other voice and resonance disorders: Secondary | ICD-10-CM | POA: Diagnosis not present

## 2019-02-25 DIAGNOSIS — E785 Hyperlipidemia, unspecified: Secondary | ICD-10-CM | POA: Diagnosis not present

## 2019-02-25 DIAGNOSIS — A419 Sepsis, unspecified organism: Secondary | ICD-10-CM | POA: Diagnosis not present

## 2019-02-25 NOTE — Telephone Encounter (Signed)
Patient number not working   lmov for daughter on dpr to call office

## 2019-02-26 DIAGNOSIS — I482 Chronic atrial fibrillation, unspecified: Secondary | ICD-10-CM | POA: Diagnosis not present

## 2019-02-26 DIAGNOSIS — N3000 Acute cystitis without hematuria: Secondary | ICD-10-CM | POA: Diagnosis not present

## 2019-02-26 DIAGNOSIS — K5909 Other constipation: Secondary | ICD-10-CM | POA: Diagnosis not present

## 2019-02-26 DIAGNOSIS — I10 Essential (primary) hypertension: Secondary | ICD-10-CM | POA: Diagnosis not present

## 2019-02-26 DIAGNOSIS — R498 Other voice and resonance disorders: Secondary | ICD-10-CM | POA: Diagnosis not present

## 2019-02-26 DIAGNOSIS — K219 Gastro-esophageal reflux disease without esophagitis: Secondary | ICD-10-CM | POA: Diagnosis not present

## 2019-02-26 DIAGNOSIS — E119 Type 2 diabetes mellitus without complications: Secondary | ICD-10-CM | POA: Diagnosis not present

## 2019-02-26 DIAGNOSIS — A419 Sepsis, unspecified organism: Secondary | ICD-10-CM | POA: Diagnosis not present

## 2019-02-26 DIAGNOSIS — E785 Hyperlipidemia, unspecified: Secondary | ICD-10-CM | POA: Diagnosis not present

## 2019-02-27 DIAGNOSIS — K5909 Other constipation: Secondary | ICD-10-CM | POA: Diagnosis not present

## 2019-02-27 DIAGNOSIS — R498 Other voice and resonance disorders: Secondary | ICD-10-CM | POA: Diagnosis not present

## 2019-02-27 DIAGNOSIS — E119 Type 2 diabetes mellitus without complications: Secondary | ICD-10-CM | POA: Diagnosis not present

## 2019-02-27 DIAGNOSIS — A419 Sepsis, unspecified organism: Secondary | ICD-10-CM | POA: Diagnosis not present

## 2019-02-27 DIAGNOSIS — I10 Essential (primary) hypertension: Secondary | ICD-10-CM | POA: Diagnosis not present

## 2019-02-27 DIAGNOSIS — I482 Chronic atrial fibrillation, unspecified: Secondary | ICD-10-CM | POA: Diagnosis not present

## 2019-02-27 DIAGNOSIS — K219 Gastro-esophageal reflux disease without esophagitis: Secondary | ICD-10-CM | POA: Diagnosis not present

## 2019-02-27 DIAGNOSIS — N3000 Acute cystitis without hematuria: Secondary | ICD-10-CM | POA: Diagnosis not present

## 2019-02-27 DIAGNOSIS — E785 Hyperlipidemia, unspecified: Secondary | ICD-10-CM | POA: Diagnosis not present

## 2019-03-02 DIAGNOSIS — R488 Other symbolic dysfunctions: Secondary | ICD-10-CM | POA: Diagnosis not present

## 2019-03-02 DIAGNOSIS — K219 Gastro-esophageal reflux disease without esophagitis: Secondary | ICD-10-CM | POA: Diagnosis not present

## 2019-03-02 DIAGNOSIS — E119 Type 2 diabetes mellitus without complications: Secondary | ICD-10-CM | POA: Diagnosis not present

## 2019-03-02 DIAGNOSIS — K5909 Other constipation: Secondary | ICD-10-CM | POA: Diagnosis not present

## 2019-03-02 DIAGNOSIS — N3000 Acute cystitis without hematuria: Secondary | ICD-10-CM | POA: Diagnosis not present

## 2019-03-02 DIAGNOSIS — E785 Hyperlipidemia, unspecified: Secondary | ICD-10-CM | POA: Diagnosis not present

## 2019-03-02 DIAGNOSIS — I482 Chronic atrial fibrillation, unspecified: Secondary | ICD-10-CM | POA: Diagnosis not present

## 2019-03-02 DIAGNOSIS — I10 Essential (primary) hypertension: Secondary | ICD-10-CM | POA: Diagnosis not present

## 2019-03-03 DIAGNOSIS — Z8673 Personal history of transient ischemic attack (TIA), and cerebral infarction without residual deficits: Secondary | ICD-10-CM | POA: Diagnosis not present

## 2019-03-03 DIAGNOSIS — N3000 Acute cystitis without hematuria: Secondary | ICD-10-CM | POA: Diagnosis not present

## 2019-03-03 DIAGNOSIS — I10 Essential (primary) hypertension: Secondary | ICD-10-CM | POA: Diagnosis not present

## 2019-03-03 DIAGNOSIS — I482 Chronic atrial fibrillation, unspecified: Secondary | ICD-10-CM | POA: Diagnosis not present

## 2019-03-03 DIAGNOSIS — E785 Hyperlipidemia, unspecified: Secondary | ICD-10-CM | POA: Diagnosis not present

## 2019-03-03 DIAGNOSIS — R488 Other symbolic dysfunctions: Secondary | ICD-10-CM | POA: Diagnosis not present

## 2019-03-03 DIAGNOSIS — E119 Type 2 diabetes mellitus without complications: Secondary | ICD-10-CM | POA: Diagnosis not present

## 2019-03-03 DIAGNOSIS — K5909 Other constipation: Secondary | ICD-10-CM | POA: Diagnosis not present

## 2019-03-03 DIAGNOSIS — M6281 Muscle weakness (generalized): Secondary | ICD-10-CM | POA: Diagnosis not present

## 2019-03-03 DIAGNOSIS — K219 Gastro-esophageal reflux disease without esophagitis: Secondary | ICD-10-CM | POA: Diagnosis not present

## 2019-03-04 DIAGNOSIS — I10 Essential (primary) hypertension: Secondary | ICD-10-CM | POA: Diagnosis not present

## 2019-03-04 DIAGNOSIS — R488 Other symbolic dysfunctions: Secondary | ICD-10-CM | POA: Diagnosis not present

## 2019-03-04 DIAGNOSIS — K219 Gastro-esophageal reflux disease without esophagitis: Secondary | ICD-10-CM | POA: Diagnosis not present

## 2019-03-04 DIAGNOSIS — I482 Chronic atrial fibrillation, unspecified: Secondary | ICD-10-CM | POA: Diagnosis not present

## 2019-03-04 DIAGNOSIS — E119 Type 2 diabetes mellitus without complications: Secondary | ICD-10-CM | POA: Diagnosis not present

## 2019-03-04 DIAGNOSIS — E785 Hyperlipidemia, unspecified: Secondary | ICD-10-CM | POA: Diagnosis not present

## 2019-03-04 DIAGNOSIS — K5909 Other constipation: Secondary | ICD-10-CM | POA: Diagnosis not present

## 2019-03-04 DIAGNOSIS — N3000 Acute cystitis without hematuria: Secondary | ICD-10-CM | POA: Diagnosis not present

## 2019-03-05 DIAGNOSIS — E785 Hyperlipidemia, unspecified: Secondary | ICD-10-CM | POA: Diagnosis not present

## 2019-03-05 DIAGNOSIS — K219 Gastro-esophageal reflux disease without esophagitis: Secondary | ICD-10-CM | POA: Diagnosis not present

## 2019-03-05 DIAGNOSIS — E119 Type 2 diabetes mellitus without complications: Secondary | ICD-10-CM | POA: Diagnosis not present

## 2019-03-05 DIAGNOSIS — K5909 Other constipation: Secondary | ICD-10-CM | POA: Diagnosis not present

## 2019-03-05 DIAGNOSIS — N3000 Acute cystitis without hematuria: Secondary | ICD-10-CM | POA: Diagnosis not present

## 2019-03-05 DIAGNOSIS — I482 Chronic atrial fibrillation, unspecified: Secondary | ICD-10-CM | POA: Diagnosis not present

## 2019-03-05 DIAGNOSIS — I10 Essential (primary) hypertension: Secondary | ICD-10-CM | POA: Diagnosis not present

## 2019-03-05 DIAGNOSIS — R488 Other symbolic dysfunctions: Secondary | ICD-10-CM | POA: Diagnosis not present

## 2019-03-06 DIAGNOSIS — E119 Type 2 diabetes mellitus without complications: Secondary | ICD-10-CM | POA: Diagnosis not present

## 2019-03-06 DIAGNOSIS — K5909 Other constipation: Secondary | ICD-10-CM | POA: Diagnosis not present

## 2019-03-06 DIAGNOSIS — N3000 Acute cystitis without hematuria: Secondary | ICD-10-CM | POA: Diagnosis not present

## 2019-03-06 DIAGNOSIS — I10 Essential (primary) hypertension: Secondary | ICD-10-CM | POA: Diagnosis not present

## 2019-03-06 DIAGNOSIS — K219 Gastro-esophageal reflux disease without esophagitis: Secondary | ICD-10-CM | POA: Diagnosis not present

## 2019-03-06 DIAGNOSIS — E785 Hyperlipidemia, unspecified: Secondary | ICD-10-CM | POA: Diagnosis not present

## 2019-03-06 DIAGNOSIS — I482 Chronic atrial fibrillation, unspecified: Secondary | ICD-10-CM | POA: Diagnosis not present

## 2019-03-06 DIAGNOSIS — R488 Other symbolic dysfunctions: Secondary | ICD-10-CM | POA: Diagnosis not present

## 2019-03-07 ENCOUNTER — Other Ambulatory Visit
Admission: RE | Admit: 2019-03-07 | Discharge: 2019-03-07 | Disposition: A | Discharge status: Home / Self Care | Payer: Medicare Other | Source: Ambulatory Visit | Attending: Family Medicine | Admitting: Family Medicine

## 2019-03-07 DIAGNOSIS — Z1159 Encounter for screening for other viral diseases: Secondary | ICD-10-CM | POA: Diagnosis not present

## 2019-03-09 DIAGNOSIS — E785 Hyperlipidemia, unspecified: Secondary | ICD-10-CM | POA: Diagnosis not present

## 2019-03-09 DIAGNOSIS — R488 Other symbolic dysfunctions: Secondary | ICD-10-CM | POA: Diagnosis not present

## 2019-03-09 DIAGNOSIS — I4891 Unspecified atrial fibrillation: Secondary | ICD-10-CM | POA: Diagnosis not present

## 2019-03-09 DIAGNOSIS — E119 Type 2 diabetes mellitus without complications: Secondary | ICD-10-CM | POA: Diagnosis not present

## 2019-03-09 DIAGNOSIS — I10 Essential (primary) hypertension: Secondary | ICD-10-CM | POA: Diagnosis not present

## 2019-03-09 DIAGNOSIS — N3000 Acute cystitis without hematuria: Secondary | ICD-10-CM | POA: Diagnosis not present

## 2019-03-09 DIAGNOSIS — K219 Gastro-esophageal reflux disease without esophagitis: Secondary | ICD-10-CM | POA: Diagnosis not present

## 2019-03-09 DIAGNOSIS — D649 Anemia, unspecified: Secondary | ICD-10-CM | POA: Diagnosis not present

## 2019-03-09 DIAGNOSIS — I482 Chronic atrial fibrillation, unspecified: Secondary | ICD-10-CM | POA: Diagnosis not present

## 2019-03-09 DIAGNOSIS — I4821 Permanent atrial fibrillation: Secondary | ICD-10-CM | POA: Diagnosis not present

## 2019-03-09 DIAGNOSIS — K5909 Other constipation: Secondary | ICD-10-CM | POA: Diagnosis not present

## 2019-03-09 LAB — NOVEL CORONAVIRUS, NAA (HOSP ORDER, SEND-OUT TO REF LAB; TAT 18-24 HRS): SARS-CoV-2, NAA: NOT DETECTED

## 2019-03-10 DIAGNOSIS — N3000 Acute cystitis without hematuria: Secondary | ICD-10-CM | POA: Diagnosis not present

## 2019-03-10 DIAGNOSIS — I482 Chronic atrial fibrillation, unspecified: Secondary | ICD-10-CM | POA: Diagnosis not present

## 2019-03-10 DIAGNOSIS — E785 Hyperlipidemia, unspecified: Secondary | ICD-10-CM | POA: Diagnosis not present

## 2019-03-10 DIAGNOSIS — R488 Other symbolic dysfunctions: Secondary | ICD-10-CM | POA: Diagnosis not present

## 2019-03-10 DIAGNOSIS — K219 Gastro-esophageal reflux disease without esophagitis: Secondary | ICD-10-CM | POA: Diagnosis not present

## 2019-03-10 DIAGNOSIS — K5909 Other constipation: Secondary | ICD-10-CM | POA: Diagnosis not present

## 2019-03-10 DIAGNOSIS — E119 Type 2 diabetes mellitus without complications: Secondary | ICD-10-CM | POA: Diagnosis not present

## 2019-03-10 DIAGNOSIS — I10 Essential (primary) hypertension: Secondary | ICD-10-CM | POA: Diagnosis not present

## 2019-03-11 DIAGNOSIS — E119 Type 2 diabetes mellitus without complications: Secondary | ICD-10-CM | POA: Diagnosis not present

## 2019-03-11 DIAGNOSIS — E785 Hyperlipidemia, unspecified: Secondary | ICD-10-CM | POA: Diagnosis not present

## 2019-03-11 DIAGNOSIS — I10 Essential (primary) hypertension: Secondary | ICD-10-CM | POA: Diagnosis not present

## 2019-03-11 DIAGNOSIS — K5909 Other constipation: Secondary | ICD-10-CM | POA: Diagnosis not present

## 2019-03-11 DIAGNOSIS — N3000 Acute cystitis without hematuria: Secondary | ICD-10-CM | POA: Diagnosis not present

## 2019-03-11 DIAGNOSIS — R488 Other symbolic dysfunctions: Secondary | ICD-10-CM | POA: Diagnosis not present

## 2019-03-11 DIAGNOSIS — K219 Gastro-esophageal reflux disease without esophagitis: Secondary | ICD-10-CM | POA: Diagnosis not present

## 2019-03-11 DIAGNOSIS — I482 Chronic atrial fibrillation, unspecified: Secondary | ICD-10-CM | POA: Diagnosis not present

## 2019-03-17 DIAGNOSIS — I10 Essential (primary) hypertension: Secondary | ICD-10-CM | POA: Diagnosis not present

## 2019-03-17 DIAGNOSIS — E119 Type 2 diabetes mellitus without complications: Secondary | ICD-10-CM | POA: Diagnosis not present

## 2019-03-17 DIAGNOSIS — M6281 Muscle weakness (generalized): Secondary | ICD-10-CM | POA: Diagnosis not present

## 2019-03-17 DIAGNOSIS — Z8673 Personal history of transient ischemic attack (TIA), and cerebral infarction without residual deficits: Secondary | ICD-10-CM | POA: Diagnosis not present

## 2019-03-30 DIAGNOSIS — Z79899 Other long term (current) drug therapy: Secondary | ICD-10-CM | POA: Diagnosis not present

## 2019-03-30 DIAGNOSIS — Z7901 Long term (current) use of anticoagulants: Secondary | ICD-10-CM | POA: Diagnosis not present

## 2019-03-30 DIAGNOSIS — I4891 Unspecified atrial fibrillation: Secondary | ICD-10-CM | POA: Diagnosis not present

## 2019-03-30 DIAGNOSIS — D649 Anemia, unspecified: Secondary | ICD-10-CM | POA: Diagnosis not present

## 2019-04-03 DIAGNOSIS — I10 Essential (primary) hypertension: Secondary | ICD-10-CM | POA: Diagnosis not present

## 2019-04-03 DIAGNOSIS — E785 Hyperlipidemia, unspecified: Secondary | ICD-10-CM | POA: Diagnosis not present

## 2019-04-03 DIAGNOSIS — Z8673 Personal history of transient ischemic attack (TIA), and cerebral infarction without residual deficits: Secondary | ICD-10-CM | POA: Diagnosis not present

## 2019-04-03 DIAGNOSIS — E119 Type 2 diabetes mellitus without complications: Secondary | ICD-10-CM | POA: Diagnosis not present

## 2019-04-07 DIAGNOSIS — I4891 Unspecified atrial fibrillation: Secondary | ICD-10-CM | POA: Diagnosis not present

## 2019-04-07 DIAGNOSIS — Z7901 Long term (current) use of anticoagulants: Secondary | ICD-10-CM | POA: Diagnosis not present

## 2019-04-08 NOTE — Telephone Encounter (Signed)
Order in [place].  Several attempts to contact patient. Closing this encounter

## 2019-04-13 DIAGNOSIS — Z7901 Long term (current) use of anticoagulants: Secondary | ICD-10-CM | POA: Diagnosis not present

## 2019-04-13 DIAGNOSIS — I4891 Unspecified atrial fibrillation: Secondary | ICD-10-CM | POA: Diagnosis not present

## 2019-04-21 DIAGNOSIS — Z7901 Long term (current) use of anticoagulants: Secondary | ICD-10-CM | POA: Diagnosis not present

## 2019-04-21 DIAGNOSIS — I4891 Unspecified atrial fibrillation: Secondary | ICD-10-CM | POA: Diagnosis not present

## 2019-05-01 DIAGNOSIS — I4891 Unspecified atrial fibrillation: Secondary | ICD-10-CM | POA: Diagnosis not present

## 2019-05-01 DIAGNOSIS — Z7901 Long term (current) use of anticoagulants: Secondary | ICD-10-CM | POA: Diagnosis not present

## 2019-05-05 DIAGNOSIS — I10 Essential (primary) hypertension: Secondary | ICD-10-CM | POA: Diagnosis not present

## 2019-05-05 DIAGNOSIS — E785 Hyperlipidemia, unspecified: Secondary | ICD-10-CM | POA: Diagnosis not present

## 2019-05-05 DIAGNOSIS — Z8673 Personal history of transient ischemic attack (TIA), and cerebral infarction without residual deficits: Secondary | ICD-10-CM | POA: Diagnosis not present

## 2019-05-05 DIAGNOSIS — E119 Type 2 diabetes mellitus without complications: Secondary | ICD-10-CM | POA: Diagnosis not present

## 2019-05-09 DIAGNOSIS — Z79899 Other long term (current) drug therapy: Secondary | ICD-10-CM | POA: Diagnosis not present

## 2019-05-09 DIAGNOSIS — I4891 Unspecified atrial fibrillation: Secondary | ICD-10-CM | POA: Diagnosis not present

## 2019-05-09 DIAGNOSIS — E785 Hyperlipidemia, unspecified: Secondary | ICD-10-CM | POA: Diagnosis not present

## 2019-05-09 DIAGNOSIS — E119 Type 2 diabetes mellitus without complications: Secondary | ICD-10-CM | POA: Diagnosis not present

## 2019-05-09 DIAGNOSIS — Z7901 Long term (current) use of anticoagulants: Secondary | ICD-10-CM | POA: Diagnosis not present

## 2019-05-12 DIAGNOSIS — I4891 Unspecified atrial fibrillation: Secondary | ICD-10-CM | POA: Diagnosis not present

## 2019-05-12 DIAGNOSIS — Z7901 Long term (current) use of anticoagulants: Secondary | ICD-10-CM | POA: Diagnosis not present

## 2019-05-22 DIAGNOSIS — Z7901 Long term (current) use of anticoagulants: Secondary | ICD-10-CM | POA: Diagnosis not present

## 2019-05-22 DIAGNOSIS — I4891 Unspecified atrial fibrillation: Secondary | ICD-10-CM | POA: Diagnosis not present

## 2019-05-28 DIAGNOSIS — L84 Corns and callosities: Secondary | ICD-10-CM | POA: Diagnosis not present

## 2019-05-28 DIAGNOSIS — I739 Peripheral vascular disease, unspecified: Secondary | ICD-10-CM | POA: Diagnosis not present

## 2019-05-28 DIAGNOSIS — B351 Tinea unguium: Secondary | ICD-10-CM | POA: Diagnosis not present

## 2019-05-28 DIAGNOSIS — L853 Xerosis cutis: Secondary | ICD-10-CM | POA: Diagnosis not present

## 2019-06-02 DIAGNOSIS — Z7901 Long term (current) use of anticoagulants: Secondary | ICD-10-CM | POA: Diagnosis not present

## 2019-06-02 DIAGNOSIS — I4891 Unspecified atrial fibrillation: Secondary | ICD-10-CM | POA: Diagnosis not present

## 2019-06-04 DIAGNOSIS — K219 Gastro-esophageal reflux disease without esophagitis: Secondary | ICD-10-CM | POA: Diagnosis not present

## 2019-06-04 DIAGNOSIS — E785 Hyperlipidemia, unspecified: Secondary | ICD-10-CM | POA: Diagnosis not present

## 2019-06-04 DIAGNOSIS — M6281 Muscle weakness (generalized): Secondary | ICD-10-CM | POA: Diagnosis not present

## 2019-06-04 DIAGNOSIS — I482 Chronic atrial fibrillation, unspecified: Secondary | ICD-10-CM | POA: Diagnosis not present

## 2019-06-04 DIAGNOSIS — R498 Other voice and resonance disorders: Secondary | ICD-10-CM | POA: Diagnosis not present

## 2019-06-04 DIAGNOSIS — K5909 Other constipation: Secondary | ICD-10-CM | POA: Diagnosis not present

## 2019-06-04 DIAGNOSIS — I69311 Memory deficit following cerebral infarction: Secondary | ICD-10-CM | POA: Diagnosis not present

## 2019-06-04 DIAGNOSIS — I10 Essential (primary) hypertension: Secondary | ICD-10-CM | POA: Diagnosis not present

## 2019-06-04 DIAGNOSIS — E119 Type 2 diabetes mellitus without complications: Secondary | ICD-10-CM | POA: Diagnosis not present

## 2019-06-05 DIAGNOSIS — M6281 Muscle weakness (generalized): Secondary | ICD-10-CM | POA: Diagnosis not present

## 2019-06-05 DIAGNOSIS — K219 Gastro-esophageal reflux disease without esophagitis: Secondary | ICD-10-CM | POA: Diagnosis not present

## 2019-06-05 DIAGNOSIS — I10 Essential (primary) hypertension: Secondary | ICD-10-CM | POA: Diagnosis not present

## 2019-06-05 DIAGNOSIS — K5909 Other constipation: Secondary | ICD-10-CM | POA: Diagnosis not present

## 2019-06-05 DIAGNOSIS — E119 Type 2 diabetes mellitus without complications: Secondary | ICD-10-CM | POA: Diagnosis not present

## 2019-06-05 DIAGNOSIS — I69311 Memory deficit following cerebral infarction: Secondary | ICD-10-CM | POA: Diagnosis not present

## 2019-06-05 DIAGNOSIS — E785 Hyperlipidemia, unspecified: Secondary | ICD-10-CM | POA: Diagnosis not present

## 2019-06-05 DIAGNOSIS — R498 Other voice and resonance disorders: Secondary | ICD-10-CM | POA: Diagnosis not present

## 2019-06-05 DIAGNOSIS — I482 Chronic atrial fibrillation, unspecified: Secondary | ICD-10-CM | POA: Diagnosis not present

## 2019-06-08 DIAGNOSIS — M6281 Muscle weakness (generalized): Secondary | ICD-10-CM | POA: Diagnosis not present

## 2019-06-08 DIAGNOSIS — I69311 Memory deficit following cerebral infarction: Secondary | ICD-10-CM | POA: Diagnosis not present

## 2019-06-08 DIAGNOSIS — K219 Gastro-esophageal reflux disease without esophagitis: Secondary | ICD-10-CM | POA: Diagnosis not present

## 2019-06-08 DIAGNOSIS — I10 Essential (primary) hypertension: Secondary | ICD-10-CM | POA: Diagnosis not present

## 2019-06-08 DIAGNOSIS — E119 Type 2 diabetes mellitus without complications: Secondary | ICD-10-CM | POA: Diagnosis not present

## 2019-06-08 DIAGNOSIS — R498 Other voice and resonance disorders: Secondary | ICD-10-CM | POA: Diagnosis not present

## 2019-06-08 DIAGNOSIS — I482 Chronic atrial fibrillation, unspecified: Secondary | ICD-10-CM | POA: Diagnosis not present

## 2019-06-08 DIAGNOSIS — K5909 Other constipation: Secondary | ICD-10-CM | POA: Diagnosis not present

## 2019-06-08 DIAGNOSIS — E785 Hyperlipidemia, unspecified: Secondary | ICD-10-CM | POA: Diagnosis not present

## 2019-06-09 DIAGNOSIS — K5909 Other constipation: Secondary | ICD-10-CM | POA: Diagnosis not present

## 2019-06-09 DIAGNOSIS — K219 Gastro-esophageal reflux disease without esophagitis: Secondary | ICD-10-CM | POA: Diagnosis not present

## 2019-06-09 DIAGNOSIS — R498 Other voice and resonance disorders: Secondary | ICD-10-CM | POA: Diagnosis not present

## 2019-06-09 DIAGNOSIS — E785 Hyperlipidemia, unspecified: Secondary | ICD-10-CM | POA: Diagnosis not present

## 2019-06-09 DIAGNOSIS — I482 Chronic atrial fibrillation, unspecified: Secondary | ICD-10-CM | POA: Diagnosis not present

## 2019-06-09 DIAGNOSIS — I69311 Memory deficit following cerebral infarction: Secondary | ICD-10-CM | POA: Diagnosis not present

## 2019-06-09 DIAGNOSIS — E119 Type 2 diabetes mellitus without complications: Secondary | ICD-10-CM | POA: Diagnosis not present

## 2019-06-09 DIAGNOSIS — M6281 Muscle weakness (generalized): Secondary | ICD-10-CM | POA: Diagnosis not present

## 2019-06-09 DIAGNOSIS — I10 Essential (primary) hypertension: Secondary | ICD-10-CM | POA: Diagnosis not present

## 2019-06-10 DIAGNOSIS — I10 Essential (primary) hypertension: Secondary | ICD-10-CM | POA: Diagnosis not present

## 2019-06-10 DIAGNOSIS — E785 Hyperlipidemia, unspecified: Secondary | ICD-10-CM | POA: Diagnosis not present

## 2019-06-10 DIAGNOSIS — K5909 Other constipation: Secondary | ICD-10-CM | POA: Diagnosis not present

## 2019-06-10 DIAGNOSIS — K219 Gastro-esophageal reflux disease without esophagitis: Secondary | ICD-10-CM | POA: Diagnosis not present

## 2019-06-10 DIAGNOSIS — I482 Chronic atrial fibrillation, unspecified: Secondary | ICD-10-CM | POA: Diagnosis not present

## 2019-06-10 DIAGNOSIS — E119 Type 2 diabetes mellitus without complications: Secondary | ICD-10-CM | POA: Diagnosis not present

## 2019-06-10 DIAGNOSIS — I69311 Memory deficit following cerebral infarction: Secondary | ICD-10-CM | POA: Diagnosis not present

## 2019-06-10 DIAGNOSIS — M6281 Muscle weakness (generalized): Secondary | ICD-10-CM | POA: Diagnosis not present

## 2019-06-10 DIAGNOSIS — R498 Other voice and resonance disorders: Secondary | ICD-10-CM | POA: Diagnosis not present

## 2019-06-11 DIAGNOSIS — I10 Essential (primary) hypertension: Secondary | ICD-10-CM | POA: Diagnosis not present

## 2019-06-11 DIAGNOSIS — Z794 Long term (current) use of insulin: Secondary | ICD-10-CM | POA: Diagnosis not present

## 2019-06-11 DIAGNOSIS — Z8673 Personal history of transient ischemic attack (TIA), and cerebral infarction without residual deficits: Secondary | ICD-10-CM | POA: Diagnosis not present

## 2019-06-11 DIAGNOSIS — E1122 Type 2 diabetes mellitus with diabetic chronic kidney disease: Secondary | ICD-10-CM | POA: Diagnosis not present

## 2019-06-12 DIAGNOSIS — I10 Essential (primary) hypertension: Secondary | ICD-10-CM | POA: Diagnosis not present

## 2019-06-12 DIAGNOSIS — D649 Anemia, unspecified: Secondary | ICD-10-CM | POA: Diagnosis not present

## 2019-06-13 DIAGNOSIS — N390 Urinary tract infection, site not specified: Secondary | ICD-10-CM | POA: Diagnosis not present

## 2019-06-13 DIAGNOSIS — R319 Hematuria, unspecified: Secondary | ICD-10-CM | POA: Diagnosis not present

## 2019-06-13 DIAGNOSIS — D649 Anemia, unspecified: Secondary | ICD-10-CM | POA: Diagnosis not present

## 2019-06-13 DIAGNOSIS — Z7901 Long term (current) use of anticoagulants: Secondary | ICD-10-CM | POA: Diagnosis not present

## 2019-06-13 DIAGNOSIS — I4891 Unspecified atrial fibrillation: Secondary | ICD-10-CM | POA: Diagnosis not present

## 2019-06-13 DIAGNOSIS — Z79899 Other long term (current) drug therapy: Secondary | ICD-10-CM | POA: Diagnosis not present

## 2019-06-15 DIAGNOSIS — Z1159 Encounter for screening for other viral diseases: Secondary | ICD-10-CM | POA: Diagnosis not present

## 2019-06-22 DIAGNOSIS — Z1159 Encounter for screening for other viral diseases: Secondary | ICD-10-CM | POA: Diagnosis not present

## 2019-06-23 DIAGNOSIS — N390 Urinary tract infection, site not specified: Secondary | ICD-10-CM | POA: Diagnosis not present

## 2019-06-23 DIAGNOSIS — I4891 Unspecified atrial fibrillation: Secondary | ICD-10-CM | POA: Diagnosis not present

## 2019-06-23 DIAGNOSIS — Z7901 Long term (current) use of anticoagulants: Secondary | ICD-10-CM | POA: Diagnosis not present

## 2019-07-03 DIAGNOSIS — Z7901 Long term (current) use of anticoagulants: Secondary | ICD-10-CM | POA: Diagnosis not present

## 2019-07-03 DIAGNOSIS — I4891 Unspecified atrial fibrillation: Secondary | ICD-10-CM | POA: Diagnosis not present

## 2019-07-09 DIAGNOSIS — I482 Chronic atrial fibrillation, unspecified: Secondary | ICD-10-CM | POA: Diagnosis not present

## 2019-07-09 DIAGNOSIS — E785 Hyperlipidemia, unspecified: Secondary | ICD-10-CM | POA: Diagnosis not present

## 2019-07-09 DIAGNOSIS — K219 Gastro-esophageal reflux disease without esophagitis: Secondary | ICD-10-CM | POA: Diagnosis not present

## 2019-07-09 DIAGNOSIS — M170 Bilateral primary osteoarthritis of knee: Secondary | ICD-10-CM | POA: Diagnosis not present

## 2019-07-09 DIAGNOSIS — I10 Essential (primary) hypertension: Secondary | ICD-10-CM | POA: Diagnosis not present

## 2019-07-09 DIAGNOSIS — E119 Type 2 diabetes mellitus without complications: Secondary | ICD-10-CM | POA: Diagnosis not present

## 2019-07-09 DIAGNOSIS — R262 Difficulty in walking, not elsewhere classified: Secondary | ICD-10-CM | POA: Diagnosis not present

## 2019-07-09 DIAGNOSIS — K5909 Other constipation: Secondary | ICD-10-CM | POA: Diagnosis not present

## 2019-07-09 DIAGNOSIS — Z23 Encounter for immunization: Secondary | ICD-10-CM | POA: Diagnosis not present

## 2019-07-10 DIAGNOSIS — I10 Essential (primary) hypertension: Secondary | ICD-10-CM | POA: Diagnosis not present

## 2019-07-10 DIAGNOSIS — M170 Bilateral primary osteoarthritis of knee: Secondary | ICD-10-CM | POA: Diagnosis not present

## 2019-07-10 DIAGNOSIS — K219 Gastro-esophageal reflux disease without esophagitis: Secondary | ICD-10-CM | POA: Diagnosis not present

## 2019-07-10 DIAGNOSIS — I482 Chronic atrial fibrillation, unspecified: Secondary | ICD-10-CM | POA: Diagnosis not present

## 2019-07-10 DIAGNOSIS — E119 Type 2 diabetes mellitus without complications: Secondary | ICD-10-CM | POA: Diagnosis not present

## 2019-07-10 DIAGNOSIS — R262 Difficulty in walking, not elsewhere classified: Secondary | ICD-10-CM | POA: Diagnosis not present

## 2019-07-10 DIAGNOSIS — K5909 Other constipation: Secondary | ICD-10-CM | POA: Diagnosis not present

## 2019-07-10 DIAGNOSIS — E785 Hyperlipidemia, unspecified: Secondary | ICD-10-CM | POA: Diagnosis not present

## 2019-07-11 DIAGNOSIS — R6889 Other general symptoms and signs: Secondary | ICD-10-CM | POA: Diagnosis not present

## 2019-07-11 DIAGNOSIS — I4891 Unspecified atrial fibrillation: Secondary | ICD-10-CM | POA: Diagnosis not present

## 2019-07-13 DIAGNOSIS — K219 Gastro-esophageal reflux disease without esophagitis: Secondary | ICD-10-CM | POA: Diagnosis not present

## 2019-07-13 DIAGNOSIS — I482 Chronic atrial fibrillation, unspecified: Secondary | ICD-10-CM | POA: Diagnosis not present

## 2019-07-13 DIAGNOSIS — M170 Bilateral primary osteoarthritis of knee: Secondary | ICD-10-CM | POA: Diagnosis not present

## 2019-07-13 DIAGNOSIS — E785 Hyperlipidemia, unspecified: Secondary | ICD-10-CM | POA: Diagnosis not present

## 2019-07-13 DIAGNOSIS — E119 Type 2 diabetes mellitus without complications: Secondary | ICD-10-CM | POA: Diagnosis not present

## 2019-07-13 DIAGNOSIS — R262 Difficulty in walking, not elsewhere classified: Secondary | ICD-10-CM | POA: Diagnosis not present

## 2019-07-13 DIAGNOSIS — K5909 Other constipation: Secondary | ICD-10-CM | POA: Diagnosis not present

## 2019-07-13 DIAGNOSIS — I10 Essential (primary) hypertension: Secondary | ICD-10-CM | POA: Diagnosis not present

## 2019-07-14 DIAGNOSIS — Z1159 Encounter for screening for other viral diseases: Secondary | ICD-10-CM | POA: Diagnosis not present

## 2019-07-14 DIAGNOSIS — K219 Gastro-esophageal reflux disease without esophagitis: Secondary | ICD-10-CM | POA: Diagnosis not present

## 2019-07-17 DIAGNOSIS — I4891 Unspecified atrial fibrillation: Secondary | ICD-10-CM | POA: Diagnosis not present

## 2019-07-17 DIAGNOSIS — R6889 Other general symptoms and signs: Secondary | ICD-10-CM | POA: Diagnosis not present

## 2019-07-30 DIAGNOSIS — Z8673 Personal history of transient ischemic attack (TIA), and cerebral infarction without residual deficits: Secondary | ICD-10-CM | POA: Diagnosis not present

## 2019-07-30 DIAGNOSIS — Z9981 Dependence on supplemental oxygen: Secondary | ICD-10-CM | POA: Diagnosis not present

## 2019-07-30 DIAGNOSIS — U071 COVID-19: Secondary | ICD-10-CM | POA: Diagnosis not present

## 2019-07-30 DIAGNOSIS — Z794 Long term (current) use of insulin: Secondary | ICD-10-CM | POA: Diagnosis not present

## 2019-07-30 DIAGNOSIS — M6281 Muscle weakness (generalized): Secondary | ICD-10-CM | POA: Diagnosis not present

## 2019-08-02 DIAGNOSIS — U071 COVID-19: Secondary | ICD-10-CM

## 2019-08-02 HISTORY — DX: COVID-19: U07.1

## 2019-08-06 ENCOUNTER — Emergency Department
Admission: EM | Admit: 2019-08-06 | Discharge: 2019-08-06 | Disposition: A | Discharge status: Home / Self Care | Payer: Medicare Other | Source: Home / Self Care | Attending: Emergency Medicine | Admitting: Emergency Medicine

## 2019-08-06 ENCOUNTER — Emergency Department: Payer: Medicare Other

## 2019-08-06 ENCOUNTER — Other Ambulatory Visit: Payer: Self-pay

## 2019-08-06 DIAGNOSIS — R578 Other shock: Secondary | ICD-10-CM | POA: Diagnosis not present

## 2019-08-06 DIAGNOSIS — S76212A Strain of adductor muscle, fascia and tendon of left thigh, initial encounter: Secondary | ICD-10-CM

## 2019-08-06 DIAGNOSIS — R791 Abnormal coagulation profile: Secondary | ICD-10-CM | POA: Diagnosis not present

## 2019-08-06 DIAGNOSIS — R5381 Other malaise: Secondary | ICD-10-CM | POA: Diagnosis not present

## 2019-08-06 DIAGNOSIS — Z79899 Other long term (current) drug therapy: Secondary | ICD-10-CM | POA: Insufficient documentation

## 2019-08-06 DIAGNOSIS — G9341 Metabolic encephalopathy: Secondary | ICD-10-CM | POA: Diagnosis not present

## 2019-08-06 DIAGNOSIS — D6832 Hemorrhagic disorder due to extrinsic circulating anticoagulants: Secondary | ICD-10-CM | POA: Diagnosis not present

## 2019-08-06 DIAGNOSIS — I10 Essential (primary) hypertension: Secondary | ICD-10-CM | POA: Diagnosis not present

## 2019-08-06 DIAGNOSIS — N183 Chronic kidney disease, stage 3 unspecified: Secondary | ICD-10-CM | POA: Insufficient documentation

## 2019-08-06 DIAGNOSIS — X501XXA Overexertion from prolonged static or awkward postures, initial encounter: Secondary | ICD-10-CM | POA: Insufficient documentation

## 2019-08-06 DIAGNOSIS — R4182 Altered mental status, unspecified: Secondary | ICD-10-CM | POA: Diagnosis not present

## 2019-08-06 DIAGNOSIS — J9601 Acute respiratory failure with hypoxia: Secondary | ICD-10-CM | POA: Diagnosis not present

## 2019-08-06 DIAGNOSIS — R195 Other fecal abnormalities: Secondary | ICD-10-CM | POA: Diagnosis not present

## 2019-08-06 DIAGNOSIS — N179 Acute kidney failure, unspecified: Secondary | ICD-10-CM | POA: Diagnosis not present

## 2019-08-06 DIAGNOSIS — Z794 Long term (current) use of insulin: Secondary | ICD-10-CM | POA: Insufficient documentation

## 2019-08-06 DIAGNOSIS — R531 Weakness: Secondary | ICD-10-CM | POA: Diagnosis not present

## 2019-08-06 DIAGNOSIS — I679 Cerebrovascular disease, unspecified: Secondary | ICD-10-CM | POA: Diagnosis not present

## 2019-08-06 DIAGNOSIS — K922 Gastrointestinal hemorrhage, unspecified: Secondary | ICD-10-CM | POA: Diagnosis not present

## 2019-08-06 DIAGNOSIS — N189 Chronic kidney disease, unspecified: Secondary | ICD-10-CM | POA: Diagnosis not present

## 2019-08-06 DIAGNOSIS — J1289 Other viral pneumonia: Secondary | ICD-10-CM | POA: Diagnosis not present

## 2019-08-06 DIAGNOSIS — E1122 Type 2 diabetes mellitus with diabetic chronic kidney disease: Secondary | ICD-10-CM | POA: Insufficient documentation

## 2019-08-06 DIAGNOSIS — I050 Rheumatic mitral stenosis: Secondary | ICD-10-CM | POA: Diagnosis not present

## 2019-08-06 DIAGNOSIS — I130 Hypertensive heart and chronic kidney disease with heart failure and stage 1 through stage 4 chronic kidney disease, or unspecified chronic kidney disease: Secondary | ICD-10-CM | POA: Diagnosis not present

## 2019-08-06 DIAGNOSIS — I482 Chronic atrial fibrillation, unspecified: Secondary | ICD-10-CM | POA: Diagnosis not present

## 2019-08-06 DIAGNOSIS — I5032 Chronic diastolic (congestive) heart failure: Secondary | ICD-10-CM | POA: Diagnosis not present

## 2019-08-06 DIAGNOSIS — I129 Hypertensive chronic kidney disease with stage 1 through stage 4 chronic kidney disease, or unspecified chronic kidney disease: Secondary | ICD-10-CM | POA: Insufficient documentation

## 2019-08-06 DIAGNOSIS — Z823 Family history of stroke: Secondary | ICD-10-CM | POA: Diagnosis not present

## 2019-08-06 DIAGNOSIS — D649 Anemia, unspecified: Secondary | ICD-10-CM | POA: Diagnosis not present

## 2019-08-06 DIAGNOSIS — Y9389 Activity, other specified: Secondary | ICD-10-CM | POA: Insufficient documentation

## 2019-08-06 DIAGNOSIS — R262 Difficulty in walking, not elsewhere classified: Secondary | ICD-10-CM | POA: Diagnosis not present

## 2019-08-06 DIAGNOSIS — E785 Hyperlipidemia, unspecified: Secondary | ICD-10-CM | POA: Diagnosis not present

## 2019-08-06 DIAGNOSIS — Y929 Unspecified place or not applicable: Secondary | ICD-10-CM | POA: Insufficient documentation

## 2019-08-06 DIAGNOSIS — D62 Acute posthemorrhagic anemia: Secondary | ICD-10-CM | POA: Diagnosis not present

## 2019-08-06 DIAGNOSIS — R279 Unspecified lack of coordination: Secondary | ICD-10-CM | POA: Diagnosis not present

## 2019-08-06 DIAGNOSIS — M1990 Unspecified osteoarthritis, unspecified site: Secondary | ICD-10-CM | POA: Diagnosis not present

## 2019-08-06 DIAGNOSIS — D689 Coagulation defect, unspecified: Secondary | ICD-10-CM | POA: Diagnosis not present

## 2019-08-06 DIAGNOSIS — I2510 Atherosclerotic heart disease of native coronary artery without angina pectoris: Secondary | ICD-10-CM | POA: Insufficient documentation

## 2019-08-06 DIAGNOSIS — I4891 Unspecified atrial fibrillation: Secondary | ICD-10-CM | POA: Diagnosis not present

## 2019-08-06 DIAGNOSIS — K219 Gastro-esophageal reflux disease without esophagitis: Secondary | ICD-10-CM | POA: Diagnosis not present

## 2019-08-06 DIAGNOSIS — Y999 Unspecified external cause status: Secondary | ICD-10-CM | POA: Insufficient documentation

## 2019-08-06 DIAGNOSIS — M25552 Pain in left hip: Secondary | ICD-10-CM | POA: Diagnosis not present

## 2019-08-06 DIAGNOSIS — U071 COVID-19: Secondary | ICD-10-CM | POA: Diagnosis not present

## 2019-08-06 DIAGNOSIS — J9611 Chronic respiratory failure with hypoxia: Secondary | ICD-10-CM | POA: Diagnosis not present

## 2019-08-06 DIAGNOSIS — K5909 Other constipation: Secondary | ICD-10-CM | POA: Diagnosis not present

## 2019-08-06 DIAGNOSIS — I2721 Secondary pulmonary arterial hypertension: Secondary | ICD-10-CM | POA: Diagnosis not present

## 2019-08-06 DIAGNOSIS — S39011A Strain of muscle, fascia and tendon of abdomen, initial encounter: Secondary | ICD-10-CM | POA: Diagnosis not present

## 2019-08-06 DIAGNOSIS — E119 Type 2 diabetes mellitus without complications: Secondary | ICD-10-CM | POA: Diagnosis not present

## 2019-08-06 DIAGNOSIS — N184 Chronic kidney disease, stage 4 (severe): Secondary | ICD-10-CM | POA: Diagnosis not present

## 2019-08-06 DIAGNOSIS — G4733 Obstructive sleep apnea (adult) (pediatric): Secondary | ICD-10-CM | POA: Diagnosis not present

## 2019-08-06 DIAGNOSIS — Z7901 Long term (current) use of anticoagulants: Secondary | ICD-10-CM | POA: Insufficient documentation

## 2019-08-06 DIAGNOSIS — J811 Chronic pulmonary edema: Secondary | ICD-10-CM | POA: Diagnosis not present

## 2019-08-06 DIAGNOSIS — Z8673 Personal history of transient ischemic attack (TIA), and cerebral infarction without residual deficits: Secondary | ICD-10-CM | POA: Insufficient documentation

## 2019-08-06 DIAGNOSIS — R52 Pain, unspecified: Secondary | ICD-10-CM | POA: Diagnosis not present

## 2019-08-06 DIAGNOSIS — Z743 Need for continuous supervision: Secondary | ICD-10-CM | POA: Diagnosis not present

## 2019-08-06 MED ORDER — ACETAMINOPHEN 325 MG PO TABS
650.0000 mg | ORAL_TABLET | Freq: Once | ORAL | Status: AC
  Administered 2019-08-06: 08:00:00 650 mg via ORAL
  Filled 2019-08-06: qty 2

## 2019-08-06 NOTE — ED Notes (Signed)
[facility]  CALLED  FOR  TRANSPORT 

## 2019-08-06 NOTE — ED Notes (Signed)
Pt brought in [facility] from [facility]. States a few days ago she was trying to get on her bed but staff would not help her. She lifted her left leg to climb on bed and felt pain to left hip and groin. Pt states she is ambulatory but has pain on movement. Pt states pain has not improved and states "I think I pulled a muscle". Pt states if she lays still she feels no pain, no pain at this time.

## 2019-08-06 NOTE — ED Notes (Signed)
Patient's daughter aware of patient's visit and pending discharge. Patient and daughter gave verbal consent for discharge. Unable to get signature prior to dishcarge.

## 2019-08-06 NOTE — ED Notes (Signed)
Patient repositioned in bed. Call light in reach. No further needs expressed at this time.

## 2019-08-06 NOTE — ED Provider Notes (Signed)
[facility] Emergency Department Provider Note   ____________________________________________    I have reviewed the triage vital signs and the nursing notes.   HISTORY  Chief Complaint Hip Pain     HPI Megan Obrien is a 73 y.o. female with extensive past medical history as detailed below brought in for evaluation of left groin pain.  Patient reports a couple of days ago she was instructed to get into bed on her own without assistance from her aide and she believes that she pulled a muscle while doing so.  She complains of mild aching in her left medial superior leg.  No swelling or redness.  Has not take anything for this.  Diagnosed with coronavirus approximately 2 weeks ago  Past Medical History:  Diagnosis Date  . (HFpEF) heart failure with preserved ejection fraction ([place])    a. 03/2017 Echo: EF 55-60%, no rwma, Gr1 DD, mild to mod MS, mod to sev dil LA.  [name] Arthritis   . Carotid arterial disease ([place])    a. 11/2017 CTA Head/Neck: RICA 45, LICA 60.  [name] Cerebrovascular disease    a. 11/2017 MRA: mod to sev stenosis of bilat PCA's P2 segments; b. 11/2017 CTA Head/Neck: RICA 45, LICA 60, sev bilat distal MCA branch stenoses w/ sev bilat P2 stenoses.  . CKD (chronic kidney disease), stage III ([place])   . Depression   . GERD (gastroesophageal reflux disease)   . Hyperlipidemia   . Hypertension   . Left Atrial Appendage Thrombus    a. 11/2017 TEE (performed in setting of stroke/aphasia): nl EF, mod MS, dil LA w/ heavy smoke and small LAA thrombus-->Warfarin initiated.  . Mitral stenosis    a. Mild to moderate by echo 03/2017; b. 03/2017 R/LHC: PCWP 13mmHg, LVEDP 24mmHg. Mean grad of 55mmHg. Valve area of 2.02cm^2; c. 11/2017 TEE: Mod MS.  . Morbid obesity ([place])   . Murmur   . Non-obstructive CAD (coronary artery disease)    a. 03/2017 Cath: mild, non-obstructive CAD.  [name] OSA (obstructive sleep apnea)    a. Previously wore CPAP for several years but  stopped on her own and now just uses O2 via [place] @ night.  [name] PAH (pulmonary artery hypertension) ([place])    a. 03/2017 RHC: PA 24mmHg.  . Seasonal allergies   . Stroke [facility])    a. 11/2017 - presented w/ aphasia->MRI acute to early subacute ischemia w/in multiple vascular territories, largest in R frontal & L parietal lobes. Smaller foci of ischemia w/in R hippocampus, R parietal lobe, and L peripheral postcentral gyrus.  . Type 2 diabetes mellitus ([place])   . Urinary incontinence     Patient Active Problem List   Diagnosis Date Noted  . Sepsis ([place]) 01/01/2019  . History of medication noncompliance 05/26/2018  . Vaginal bleeding 02/06/2018  . Expressive aphasia 12/18/2017  . Stroke ([place]) 12/18/2017  . Mitral valve disease   . Exertional dyspnea 04/17/2017  . Fatigue 02/20/2017  . 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 Medicare annual wellness visit, subsequent 07/24/2016  . Hyperlipidemia 02/06/2016  . Essential hypertension 02/06/2016  . Type 2 diabetes mellitus with complication, without long-term current use of insulin ([place]) 02/06/2016  . Depression 02/06/2016  . GERD (gastroesophageal reflux disease) 02/06/2016  . Urge incontinence of urine 02/06/2016    Past Surgical History:  Procedure Laterality Date  . BACK SURGERY    . CARDIAC CATHETERIZATION    . KNEE SURGERY Left   . RIGHT/LEFT HEART CATH AND CORONARY  ANGIOGRAPHY Bilateral 04/25/2017   Procedure: Right/Left Heart Cath and Coronary Angiography;  Surgeon: Wellington Hampshire, MD;  Location: [facility];  Service: Cardiovascular;  Laterality: Bilateral;  . TEE WITHOUT CARDIOVERSION N/A 12/20/2017   Procedure: TRANSESOPHAGEAL ECHOCARDIOGRAM (TEE);  Surgeon: Wellington Hampshire, MD;  Location: [facility] ORS;  Service: Cardiovascular;  Laterality: N/A;  . [place]    Prior to Admission medications   Medication Sig Start Date End Date Taking? Authorizing Provider  acetaminophen (TYLENOL) 650 MG CR tablet Take 650 mg  by mouth every 8 (eight) hours as needed for pain.    [provider]  amLODipine (NORVASC) 5 MG tablet Take 1 tablet (5 mg total) by mouth daily. 02/06/19 05/07/19  Wellington Hampshire, MD  atorvastatin (LIPITOR) 40 MG tablet Take 1 tablet (40 mg total) by mouth at bedtime. 08/14/18   Pleas Koch, NP  carvedilol (COREG) 12.5 MG tablet Take 37.5 mg by mouth 2 (two) times daily with a meal.    [provider]  diclofenac sodium (VOLTAREN) 1 % GEL Apply topically 2 (two) times a day.    [provider]  docusate sodium (COLACE) 100 MG capsule Take 1 capsule (100 mg total) by mouth 2 (two) times daily. 01/05/19   Hillary Bow, MD  FLUoxetine (PROZAC) 40 MG capsule Take 1 capsule by mouth once daily for depression and anxiety. 11/11/18   Pleas Koch, NP  furosemide (LASIX) 20 MG tablet Take 1 tablet (20 mg total) by mouth daily. Patient taking differently: Take 20 mg by mouth 2 (two) times daily.  11/25/18   Dunn, Areta Haber, PA-C  glimepiride (AMARYL) 2 MG tablet Take 2 mg by mouth daily with breakfast.    Warnell Forester, NP  HYDROcodone-acetaminophen (NORCO/VICODIN) 5-325 MG tablet Take 1 tablet by mouth every 6 (six) hours as needed for moderate pain.    [provider]  Insulin Glargine (LANTUS) 100 UNIT/ML Solostar Pen Inject 15 Units into the skin every evening. Patient taking differently: Inject 22 Units into the skin daily.  04/09/18   Pleas Koch, NP  lisinopril (ZESTRIL) 20 MG tablet Take 20 mg by mouth 2 (two) times a day.    [provider]  omega-3 acid ethyl esters (LOVAZA) 1 g capsule TAKE 1 CAPSULE BY MOUTH ONCE DAILY WITH FOOD FOR CHOLESTEROL 09/01/18   Pleas Koch, NP  omeprazole (PRILOSEC) 40 MG capsule Take 1 capsule (40 mg total) by mouth daily. Patient taking differently: Take 40 mg by mouth 2 (two) times a day.  11/13/18   Pleas Koch, NP  sucralfate (CARAFATE) 1 g tablet Take 1 g by mouth 4 (four) times daily -   with meals and at bedtime.    [provider]  warfarin (COUMADIN) 5 MG tablet TAKE ONE TABLET BY MOUTH AS DIRECTED BY THE COUMADIN CLINIC Patient taking differently: Take 5 mg by mouth. Take 5 mg on Mondays, Wednesday, Friday, Sunday. Take 2.5 mg on Tuesday, Thursday, and Saturday 12/15/18   Wellington Hampshire, MD     Allergies Patient has no known allergies.  Family History  Problem Relation Age of Onset  . Lung cancer Father   . Alzheimer's disease Mother   . Stroke Maternal Grandfather     Social History Social History   Tobacco Use  . Smoking status: Never Smoker  . Smokeless tobacco: Never Used  Substance Use Topics  . Alcohol use: No    Alcohol/week: 0.0  standard drinks    Comment: rarely  . Drug use: No    Review of Systems  Constitutional: No fever/chills Eyes: No visual changes.  ENT: No neck pain Cardiovascular: Denies chest pain. Respiratory: No cough or shortness of breath Gastrointestinal: No abdominal pain.  No nausea, no vomiting.   Genitourinary: No dysuria Musculoskeletal: As above Skin: Negative for redness or injury Neurological: Negative for numbness   ____________________________________________   PHYSICAL EXAM:  VITAL SIGNS: ED Triage Vitals  Enc Vitals Group     BP 08/06/19 0604 (!) 224/76     Pulse Rate 08/06/19 0604 62     Resp 08/06/19 0604 20     Temp 08/06/19 0604 98 F (36.7 C)     Temp Source 08/06/19 0604 Oral     SpO2 08/06/19 0604 97 %     Weight 08/06/19 0605 (!) 142 kg (313 lb 0.9 oz)     Height --      Head Circumference --      Peak Flow --      Pain Score 08/06/19 0605 0     Pain Loc --      Pain Edu? --      Excl. in [place]? --    Constitutional: Alert and oriented. No acute distress.   Nose: No congestion/rhinnorhea. Mouth/Throat: Mucous membranes are moist.    Cardiovascular: Normal rate, regular rhythm. Grossly normal heart sounds.  Good peripheral circulation. Respiratory: Normal respiratory effort.   No retractions. Lungs CTAB. Gastrointestinal: Soft and nontender. No distention.  No CVA tenderness. Musculoskeletal: Left leg: Mild tenderness palpation over the left medial superior thigh consistent with adductor muscle strain, normal range of motion of the leg.  No pain with axial load on the hip or pelvis.  Warm and well perfused, no lower extremity swelling or redness Neurologic:  Normal speech and language. No gross focal neurologic deficits are appreciated.  Skin:  Skin is warm, dry and intact. No rash noted. Psychiatric: Mood and affect are normal. Speech and behavior are normal.  ____________________________________________   LABS (all labs ordered are listed, but only abnormal results are displayed)  Labs Reviewed - No data to display ____________________________________________  EKG  None ____________________________________________  RADIOLOGY X-ray negative for displaced fracture ____________________________________________   PROCEDURES  Procedure(s) performed: No  Procedures   Critical Care performed: No ____________________________________________   INITIAL IMPRESSION / ASSESSMENT AND PLAN / ED COURSE  Pertinent labs & imaging results that were available during my care of the patient were reviewed by me and considered in my medical decision making (see chart for details).  Patient well-appearing in no acute distress.  HPI and exam most consistent with abductor muscle strain, recommend ice, analgesics, supportive care, outpatient follow-up as needed  Blood pressure improved without intervention.  Recent positive coronavirus diagnosis although no shortness of breath or cough    ____________________________________________   FINAL CLINICAL IMPRESSION(S) / ED DIAGNOSES  Final diagnoses:  Strain of groin, left, initial encounter        Note:  This document was prepared using Dragon voice recognition software and may include unintentional dictation  errors.   Lavonia Drafts, MD 08/06/19 [id number]

## 2019-08-06 NOTE — ED Triage Notes (Signed)
Pt states she has been having left hip and left groin pain for few days. STates started when she was lifting her leg to get in the bed. Pt is ambulatory, but hurts worse on movement. Pt is on o2 all the time, pt is covid positive as of 2 weeks ago.

## 2019-08-07 ENCOUNTER — Other Ambulatory Visit: Payer: Self-pay

## 2019-08-07 NOTE — Patient Outreach (Signed)
[facility]) Care Management  08/07/2019  Megan Obrien 1945-10-27 [id number]   Medication Adherence call to [name] patients telephone number is disconnected,patient is past due on Atorvastatin 40 mg under [place].   [facility] Management Direct Dial [phone]  Fax [phone] [name]

## 2019-08-08 ENCOUNTER — Inpatient Hospital Stay
Admission: EM | Admit: 2019-08-08 | Discharge: 2019-08-10 | DRG: 177 | Disposition: A | Discharge status: Other Acute Inpatient Hospital | Payer: Medicare Other | Source: Skilled Nursing Facility | Attending: Family Medicine | Admitting: Family Medicine

## 2019-08-08 ENCOUNTER — Emergency Department: Payer: Medicare Other

## 2019-08-08 ENCOUNTER — Other Ambulatory Visit: Payer: Self-pay

## 2019-08-08 DIAGNOSIS — N184 Chronic kidney disease, stage 4 (severe): Secondary | ICD-10-CM | POA: Diagnosis present

## 2019-08-08 DIAGNOSIS — I050 Rheumatic mitral stenosis: Secondary | ICD-10-CM | POA: Diagnosis present

## 2019-08-08 DIAGNOSIS — Z9981 Dependence on supplemental oxygen: Secondary | ICD-10-CM

## 2019-08-08 DIAGNOSIS — Z823 Family history of stroke: Secondary | ICD-10-CM

## 2019-08-08 DIAGNOSIS — I130 Hypertensive heart and chronic kidney disease with heart failure and stage 1 through stage 4 chronic kidney disease, or unspecified chronic kidney disease: Secondary | ICD-10-CM | POA: Diagnosis present

## 2019-08-08 DIAGNOSIS — R578 Other shock: Secondary | ICD-10-CM | POA: Diagnosis present

## 2019-08-08 DIAGNOSIS — E785 Hyperlipidemia, unspecified: Secondary | ICD-10-CM | POA: Diagnosis present

## 2019-08-08 DIAGNOSIS — I10 Essential (primary) hypertension: Secondary | ICD-10-CM | POA: Diagnosis present

## 2019-08-08 DIAGNOSIS — G4733 Obstructive sleep apnea (adult) (pediatric): Secondary | ICD-10-CM | POA: Diagnosis present

## 2019-08-08 DIAGNOSIS — I482 Chronic atrial fibrillation, unspecified: Secondary | ICD-10-CM | POA: Diagnosis present

## 2019-08-08 DIAGNOSIS — I679 Cerebrovascular disease, unspecified: Secondary | ICD-10-CM | POA: Diagnosis present

## 2019-08-08 DIAGNOSIS — S39011A Strain of muscle, fascia and tendon of abdomen, initial encounter: Secondary | ICD-10-CM | POA: Diagnosis present

## 2019-08-08 DIAGNOSIS — D689 Coagulation defect, unspecified: Secondary | ICD-10-CM | POA: Diagnosis not present

## 2019-08-08 DIAGNOSIS — R4182 Altered mental status, unspecified: Secondary | ICD-10-CM

## 2019-08-08 DIAGNOSIS — U071 COVID-19: Secondary | ICD-10-CM | POA: Diagnosis present

## 2019-08-08 DIAGNOSIS — Z6841 Body Mass Index (BMI) 40.0 and over, adult: Secondary | ICD-10-CM

## 2019-08-08 DIAGNOSIS — M1990 Unspecified osteoarthritis, unspecified site: Secondary | ICD-10-CM | POA: Diagnosis present

## 2019-08-08 DIAGNOSIS — K219 Gastro-esophageal reflux disease without esophagitis: Secondary | ICD-10-CM | POA: Diagnosis present

## 2019-08-08 DIAGNOSIS — N179 Acute kidney failure, unspecified: Secondary | ICD-10-CM | POA: Diagnosis present

## 2019-08-08 DIAGNOSIS — I5032 Chronic diastolic (congestive) heart failure: Secondary | ICD-10-CM | POA: Diagnosis present

## 2019-08-08 DIAGNOSIS — T45515A Adverse effect of anticoagulants, initial encounter: Secondary | ICD-10-CM | POA: Diagnosis present

## 2019-08-08 DIAGNOSIS — Z8673 Personal history of transient ischemic attack (TIA), and cerebral infarction without residual deficits: Secondary | ICD-10-CM | POA: Diagnosis not present

## 2019-08-08 DIAGNOSIS — D500 Iron deficiency anemia secondary to blood loss (chronic): Secondary | ICD-10-CM | POA: Diagnosis present

## 2019-08-08 DIAGNOSIS — D649 Anemia, unspecified: Secondary | ICD-10-CM

## 2019-08-08 DIAGNOSIS — E118 Type 2 diabetes mellitus with unspecified complications: Secondary | ICD-10-CM | POA: Diagnosis present

## 2019-08-08 DIAGNOSIS — F419 Anxiety disorder, unspecified: Secondary | ICD-10-CM | POA: Diagnosis present

## 2019-08-08 DIAGNOSIS — D62 Acute posthemorrhagic anemia: Secondary | ICD-10-CM | POA: Diagnosis present

## 2019-08-08 DIAGNOSIS — J9611 Chronic respiratory failure with hypoxia: Secondary | ICD-10-CM | POA: Diagnosis present

## 2019-08-08 DIAGNOSIS — I2721 Secondary pulmonary arterial hypertension: Secondary | ICD-10-CM | POA: Diagnosis present

## 2019-08-08 DIAGNOSIS — G9341 Metabolic encephalopathy: Secondary | ICD-10-CM | POA: Diagnosis present

## 2019-08-08 DIAGNOSIS — J1289 Other viral pneumonia: Secondary | ICD-10-CM | POA: Diagnosis present

## 2019-08-08 DIAGNOSIS — D6832 Hemorrhagic disorder due to extrinsic circulating anticoagulants: Secondary | ICD-10-CM | POA: Diagnosis present

## 2019-08-08 DIAGNOSIS — Z794 Long term (current) use of insulin: Secondary | ICD-10-CM

## 2019-08-08 DIAGNOSIS — N189 Chronic kidney disease, unspecified: Secondary | ICD-10-CM

## 2019-08-08 DIAGNOSIS — F329 Major depressive disorder, single episode, unspecified: Secondary | ICD-10-CM | POA: Diagnosis present

## 2019-08-08 DIAGNOSIS — Z7901 Long term (current) use of anticoagulants: Secondary | ICD-10-CM | POA: Diagnosis not present

## 2019-08-08 DIAGNOSIS — K922 Gastrointestinal hemorrhage, unspecified: Secondary | ICD-10-CM | POA: Diagnosis present

## 2019-08-08 DIAGNOSIS — Z801 Family history of malignant neoplasm of trachea, bronchus and lung: Secondary | ICD-10-CM

## 2019-08-08 DIAGNOSIS — Z820 Family history of epilepsy and other diseases of the nervous system: Secondary | ICD-10-CM

## 2019-08-08 LAB — COMPREHENSIVE METABOLIC PANEL
ALT: 21 U/L (ref 0–44)
AST: 42 U/L — ABNORMAL HIGH (ref 15–41)
Albumin: 2.1 g/dL — ABNORMAL LOW (ref 3.5–5.0)
Alkaline Phosphatase: 62 U/L (ref 38–126)
Anion gap: 11 (ref 5–15)
BUN: 72 mg/dL — ABNORMAL HIGH (ref 8–23)
CO2: 31 mmol/L (ref 22–32)
Calcium: 8 mg/dL — ABNORMAL LOW (ref 8.9–10.3)
Chloride: 97 mmol/L — ABNORMAL LOW (ref 98–111)
Creatinine, Ser: 2.91 mg/dL — ABNORMAL HIGH (ref 0.44–1.00)
GFR calc Af Amer: 18 mL/min — ABNORMAL LOW (ref >60)
GFR calc non Af Amer: 15 mL/min — ABNORMAL LOW (ref >60)
Glucose, Bld: 78 mg/dL (ref 70–99)
Potassium: 3.6 mmol/L (ref 3.5–5.1)
Sodium: 139 mmol/L (ref 135–145)
Total Bilirubin: 1.1 mg/dL (ref 0.3–1.2)
Total Protein: 5.7 g/dL — ABNORMAL LOW (ref 6.5–8.1)

## 2019-08-08 LAB — LACTIC ACID, PLASMA: Lactic Acid, Venous: 1.6 mmol/L (ref 0.5–1.9)

## 2019-08-08 LAB — CBC WITH DIFFERENTIAL/PLATELET
Abs Immature Granulocytes: 0.14 10*3/uL — ABNORMAL HIGH (ref 0.00–0.07)
Basophils Absolute: 0 10*3/uL (ref 0.0–0.1)
Basophils Relative: 0 %
Eosinophils Absolute: 0 10*3/uL (ref 0.0–0.5)
Eosinophils Relative: 0 %
HCT: 13.1 % — CL (ref 36.0–46.0)
Hemoglobin: 3.9 g/dL — CL (ref 12.0–15.0)
Immature Granulocytes: 1 %
Lymphocytes Relative: 5 %
Lymphs Abs: 0.8 10*3/uL (ref 0.7–4.0)
MCH: 26.9 pg (ref 26.0–34.0)
MCHC: 29.8 g/dL — ABNORMAL LOW (ref 30.0–36.0)
MCV: 90.3 fL (ref 80.0–100.0)
Monocytes Absolute: 1.1 10*3/uL — ABNORMAL HIGH (ref 0.1–1.0)
Monocytes Relative: 7 %
Neutro Abs: 13.8 10*3/uL — ABNORMAL HIGH (ref 1.7–7.7)
Neutrophils Relative %: 87 %
Platelets: 367 10*3/uL (ref 150–400)
RBC: 1.45 MIL/uL — ABNORMAL LOW (ref 3.87–5.11)
RDW: 15.3 % (ref 11.5–15.5)
WBC: 15.8 10*3/uL — ABNORMAL HIGH (ref 4.0–10.5)
nRBC: 0.6 % — ABNORMAL HIGH (ref 0.0–0.2)

## 2019-08-08 LAB — PROTIME-INR
INR: >10 (ref 0.8–1.2)
Prothrombin Time: >90 seconds — ABNORMAL HIGH (ref 11.4–15.2)

## 2019-08-08 MED ORDER — VITAMIN K1 10 MG/ML IJ SOLN
10.0000 mg | INTRAVENOUS | Status: AC
  Administered 2019-08-08: 10 mg via INTRAVENOUS
  Filled 2019-08-08: qty 1

## 2019-08-08 MED ORDER — PANTOPRAZOLE SODIUM 40 MG IV SOLR: 40.0000 mg | Freq: Once | INTRAVENOUS | Status: DC

## 2019-08-08 MED ORDER — LACTATED RINGERS IV BOLUS
1000.0000 mL | Freq: Once | INTRAVENOUS | Status: AC
  Administered 2019-08-08: 1000 mL via INTRAVENOUS

## 2019-08-08 MED ORDER — PROTHROMBIN COMPLEX CONC HUMAN 500 UNITS IV KIT
1500.0000 [IU] | PACK | Status: AC
  Administered 2019-08-09: 01:00:00 1500 [IU] via INTRAVENOUS
  Filled 2019-08-08: qty 1000

## 2019-08-08 MED ORDER — SODIUM CHLORIDE 0.9 % IV SOLN
Freq: Once | INTRAVENOUS | Status: AC
  Administered 2019-08-08: via INTRAVENOUS

## 2019-08-08 MED ORDER — SODIUM CHLORIDE 0.9 % IV SOLN
Freq: Once | INTRAVENOUS | Status: AC
  Administered 2019-08-09: 01:00:00 via INTRAVENOUS

## 2019-08-08 NOTE — ED Provider Notes (Signed)
[facility] Emergency Department Provider Note   ____________________________________________   First MD Initiated Contact with Patient 08/08/19 2157     (approximate)  I have reviewed the triage vital signs and the nursing notes.   HISTORY  Chief Complaint Altered Mental Status and Weakness    HPI Megan Obrien is a 73 y.o. female with past medical history of CAD, AF on coumadin, CHF, hypertension, diabetes, CKD, and stroke presents to the ED for altered mental status.  History is limited due to patient's altered mental status.  [facility], who reports that she is typically able to ambulate on her own and is ANO x3 at baseline.  Staff had noticed a decline in her mental status over the past 24 hours, is no longer ambulating and is less interactive.  They do not report any fevers and patient is chronically on oxygen with no increase from her baseline 2 L nasal cannula.  Patient herself is only able to speak in single words and denies any complaints.        Past Medical History:  Diagnosis Date  . (HFpEF) heart failure with preserved ejection fraction ([place])    a. 03/2017 Echo: EF 55-60%, no rwma, Gr1 DD, mild to mod MS, mod to sev dil LA.  [name] Arthritis   . Carotid arterial disease ([place])    a. 11/2017 CTA Head/Neck: RICA 45, LICA 60.  [name] Cerebrovascular disease    a. 11/2017 MRA: mod to sev stenosis of bilat PCA's P2 segments; b. 11/2017 CTA Head/Neck: RICA 45, LICA 60, sev bilat distal MCA branch stenoses w/ sev bilat P2 stenoses.  . CKD (chronic kidney disease), stage III   . Depression   . GERD (gastroesophageal reflux disease)   . Hyperlipidemia   . Hypertension   . Left Atrial Appendage Thrombus    a. 11/2017 TEE (performed in setting of stroke/aphasia): nl EF, mod MS, dil LA w/ heavy smoke and small LAA thrombus-->Warfarin initiated.  . Mitral stenosis    a. Mild to moderate by echo 03/2017; b. 03/2017 R/LHC: PCWP 73mmHg,  LVEDP 34mmHg. Mean grad of 75mmHg. Valve area of 2.02cm^2; c. 11/2017 TEE: Mod MS.  . Morbid obesity ([place])   . Murmur   . Non-obstructive CAD (coronary artery disease)    a. 03/2017 Cath: mild, non-obstructive CAD.  [name] OSA (obstructive sleep apnea)    a. Previously wore CPAP for several years but stopped on her own and now just uses O2 via [place] @ night.  [name] PAH (pulmonary artery hypertension) ([place])    a. 03/2017 RHC: PA 6mmHg.  . Seasonal allergies   . Stroke [facility])    a. 11/2017 - presented w/ aphasia->MRI acute to early subacute ischemia w/in multiple vascular territories, largest in R frontal & L parietal lobes. Smaller foci of ischemia w/in R hippocampus, R parietal lobe, and L peripheral postcentral gyrus.  . Type 2 diabetes mellitus ([place])   . Urinary incontinence     Patient Active Problem List   Diagnosis Date Noted  . GI bleed 08/08/2019  . Sepsis ([place]) 01/01/2019  . History of medication noncompliance 05/26/2018  . Vaginal bleeding 02/06/2018  . Expressive aphasia 12/18/2017  . Stroke ([place]) 12/18/2017  . Mitral valve disease   . Exertional dyspnea 04/17/2017  . Fatigue 02/20/2017  . 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 Medicare annual wellness visit, subsequent 07/24/2016  . Hyperlipidemia 02/06/2016  . Essential hypertension 02/06/2016  . Type 2 diabetes mellitus with  complication, without long-term current use of insulin ([place]) 02/06/2016  . Depression 02/06/2016  . GERD (gastroesophageal reflux disease) 02/06/2016  . Urge incontinence of urine 02/06/2016    Past Surgical History:  Procedure Laterality Date  . BACK SURGERY    . CARDIAC CATHETERIZATION    . KNEE SURGERY Left   . RIGHT/LEFT HEART CATH AND CORONARY ANGIOGRAPHY Bilateral 04/25/2017   Procedure: Right/Left Heart Cath and Coronary Angiography;  Surgeon: Wellington Hampshire, MD;  Location: [place] [facility];  Service: Cardiovascular;  Laterality: Bilateral;  . TEE WITHOUT CARDIOVERSION N/A 12/20/2017   Procedure:  TRANSESOPHAGEAL ECHOCARDIOGRAM (TEE);  Surgeon: Wellington Hampshire, MD;  Location: [facility] ORS;  Service: Cardiovascular;  Laterality: N/A;  . [place]    Prior to Admission medications   Medication Sig Start Date End Date Taking? Authorizing Provider  acetaminophen (TYLENOL) 650 MG CR tablet Take 650 mg by mouth every 8 (eight) hours as needed for pain.   Yes [provider]  alum & mag hydroxide-simeth (MAALOX/MYLANTA) 200-200-20 MG/5ML suspension Take 10 mLs by mouth every 6 (six) hours as needed for indigestion or heartburn.   Yes [provider]  atorvastatin (LIPITOR) 40 MG tablet Take 1 tablet (40 mg total) by mouth at bedtime. 08/14/18  Yes Pleas Koch, NP  carvedilol (COREG) 25 MG tablet Take 50 mg by mouth 2 (two) times daily with a meal.    Yes [provider]  diclofenac sodium (VOLTAREN) 1 % GEL Apply topically 2 (two) times a day.   Yes [provider]  docusate sodium (COLACE) 100 MG capsule Take 1 capsule (100 mg total) by mouth 2 (two) times daily. 01/05/19  Yes Sudini, Alveta Heimlich, MD  FLUoxetine (PROZAC) 40 MG capsule Take 1 capsule by mouth once daily for depression and anxiety. 11/11/18  Yes Pleas Koch, NP  furosemide (LASIX) 20 MG tablet Take 1 tablet (20 mg total) by mouth daily. Patient taking differently: Take 20 mg by mouth 2 (two) times daily.  11/25/18  Yes Dunn, Areta Haber, PA-C  glimepiride (AMARYL) 2 MG tablet Take 2 mg by mouth daily with breakfast.   Yes Warnell Forester, NP  hydrALAZINE (APRESOLINE) 25 MG tablet Take 50 mg by mouth 4 (four) times daily.   Yes [provider]  HYDROcodone-acetaminophen (NORCO/VICODIN) 5-325 MG tablet Take 1 tablet by mouth every 6 (six) hours as needed for moderate pain.   Yes [provider]  Insulin Glargine (LANTUS) 100 UNIT/ML Solostar Pen Inject 15 Units into the skin every evening. Patient taking differently: Inject 17 Units into the skin  daily.  04/09/18  Yes Pleas Koch, NP  lisinopril (ZESTRIL) 20 MG tablet Take 20 mg by mouth 2 (two) times a day.   Yes [provider]  loperamide (IMODIUM) 2 MG capsule Take by mouth as needed for diarrhea or loose stools.   Yes [provider]  magnesium oxide (MAG-OX) 400 MG tablet Take 400 mg by mouth daily.   Yes [provider]  Melatonin 5 MG TABS Take 5 mg by mouth at bedtime.   Yes [provider]  Multiple Vitamin (MULTIVITAMIN) tablet Take 1 tablet by mouth daily.   Yes [provider]  omega-3 acid ethyl esters (LOVAZA) 1 g capsule TAKE 1 CAPSULE BY MOUTH ONCE DAILY WITH FOOD FOR CHOLESTEROL 09/01/18  Yes Pleas Koch, NP  omeprazole (PRILOSEC) 40 MG capsule Take 1 capsule (40 mg total) by mouth daily. Patient taking differently:  Take 40 mg by mouth 2 (two) times a day.  11/13/18  Yes Pleas Koch, NP  rOPINIRole (REQUIP) 0.25 MG tablet Take 0.25 mg by mouth at bedtime.   Yes [provider]  saccharomyces boulardii (FLORASTOR) 250 MG capsule Take 250 mg by mouth 2 (two) times daily.   Yes [provider]  sucralfate (CARAFATE) 1 g tablet Take 1 g by mouth 4 (four) times daily -  with meals and at bedtime.   Yes [provider]  vitamin B-12 (CYANOCOBALAMIN) 250 MCG tablet Take 250 mcg by mouth daily.   Yes [provider]  warfarin (COUMADIN) 1 MG tablet Take 3.5 mg by mouth as directed. Take 1 tablet on Monday, Weds, Friday   Yes [provider]  warfarin (COUMADIN) 3 MG tablet Take 3 mg by mouth as directed. Take 1 tablet on Sunday, Tuesday, Thursday, Saturday.   Yes [provider]    Allergies Patient has no known allergies.  Family History  Problem Relation Age of Onset  . Lung cancer Father   . Alzheimer's disease Mother   . Stroke Maternal Grandfather     Social History Social History   Tobacco Use  . Smoking status: Never Smoker  . Smokeless tobacco:  Never Used  Substance Use Topics  . Alcohol use: No    Alcohol/week: 0.0 standard drinks    Comment: rarely  . Drug use: No    Review of Systems Unable to obtain secondary to altered mental status  ____________________________________________   PHYSICAL EXAM:  VITAL SIGNS: ED Triage Vitals [08/08/19 2153]  Enc Vitals Group     BP      Pulse      Resp      Temp 98.7 F (37.1 C)     Temp Source Rectal     SpO2      Weight      Height      Head Circumference      [facility]      Pain Score      Pain Loc      Pain Edu?      Excl. in [place]?     Constitutional: Alert and oriented to person but not place or time. Eyes: Conjunctivae are normal. Head: Atraumatic. Nose: No congestion/rhinnorhea. Mouth/Throat: Mucous membranes are dry. Neck: Normal ROM Cardiovascular: Normal rate, irregularly irregular rhythm. Grossly normal heart sounds. Respiratory: Normal respiratory effort.  No retractions. Lungs CTAB. Gastrointestinal: Soft and nontender. No distention. Genitourinary: deferred Musculoskeletal: 2+ pitting edema to bilateral lower extremities with no associated tenderness. Neurologic:  Normal speech and language. No gross focal neurologic deficits are appreciated. Skin:  Skin is warm, dry and intact. No rash noted.  Pale appearing. Psychiatric: Mood and affect are normal. Speech and behavior are normal.  ____________________________________________   LABS (all labs ordered are listed, but only abnormal results are displayed)  Labs Reviewed  CBC WITH DIFFERENTIAL/PLATELET - Abnormal; Notable for the following components:      Result Value   WBC 15.8 (*)    RBC 1.45 (*)    Hemoglobin 3.9 (*)    HCT 13.1 (*)    MCHC 29.8 (*)    nRBC 0.6 (*)    Neutro Abs 13.8 (*)    Monocytes Absolute 1.1 (*)    Abs Immature Granulocytes 0.14 (*)    All other components within normal limits  COMPREHENSIVE METABOLIC PANEL - Abnormal; Notable for the following components:    Chloride 97 (*)  BUN 72 (*)    Creatinine, Ser 2.91 (*)    Calcium 8.0 (*)    Total Protein 5.7 (*)    Albumin 2.1 (*)    AST 42 (*)    GFR calc non Af Amer 15 (*)    GFR calc Af Amer 18 (*)    All other components within normal limits  PROTIME-INR - Abnormal; Notable for the following components:   Prothrombin Time >90.0 (*)    INR >10.0 (*)    All other components within normal limits  CULTURE, BLOOD (ROUTINE X 2)  CULTURE, BLOOD (ROUTINE X 2)  SARS CORONAVIRUS 2 (TAT 6-24 HRS)  LACTIC ACID, PLASMA  LACTIC ACID, PLASMA  URINALYSIS, COMPLETE (UACMP) WITH MICROSCOPIC  TYPE AND SCREEN  PREPARE RBC (CROSSMATCH)  ABO/RH   ____________________________________________  EKG  ED ECG REPORT I, Blake Divine, the attending physician, personally viewed and interpreted this ECG.   Date: 08/08/2019  EKG Time: 22:03  Rate: 72  Rhythm: Sinus arrhythmia  Axis: Normal  Intervals:Prolonged QTc  ST&T Change: None   PROCEDURES  Procedure(s) performed (including Critical Care):  .Critical Care Performed by: Blake Divine, MD Authorized by: Blake Divine, MD   Critical care provider statement:    Critical care time (minutes):  45   Critical care time was exclusive of:  Separately billable procedures and treating other patients and teaching time   Critical care was necessary to treat or prevent imminent or life-threatening deterioration of the following conditions: GI Bleed.   Critical care was time spent personally by me on the following activities:  Discussions with consultants, evaluation of patient's response to treatment, examination of patient, ordering and performing treatments and interventions, ordering and review of laboratory studies, ordering and review of radiographic studies, pulse oximetry, re-evaluation of patient's condition, obtaining history from patient or surrogate and review of old charts   I assumed direction of critical care for this patient from another  provider in my specialty: no       ____________________________________________   INITIAL IMPRESSION / ASSESSMENT AND PLAN / ED COURSE       72 year old female presents to the ED with altered mental status, is reportedly able to ambulate and ANO x3 at her baseline, arrives ANO x1.  She has no focal neurologic deficits, low suspicion for stroke.  She does appear quite pale and noted to have dark brown stool that is guaiac positive on exam.  She is afebrile but initial blood pressure is on the low side, will draw blood cultures and lactate, but no obvious source of infection.  Currently primarily concerned for GI bleed and she takes Coumadin for atrial fibrillation.  Will hold off on emergent transfusion as MAP is greater than 65, start IV fluid bolus.  Also rule out infection with chest x-ray and UA.  Plan for head CT.  If labs concerning for GI bleed with low H&H, we will need to reverse her Coumadin.  Labs significant for hemoglobin of 3.9 as well as INR of greater than 10.  Will initiate blood transfusion with 2 units PRBCs and crossmatch and additional 2 units.  She will require reversal of her Coumadin with Kcentra and vitamin K.  Her blood pressures remain borderline low but with MAP greater than 65, will continue to monitor and change to uncrossed match blood if she were to become hypotensive.  Case discussed with GI, no acute intervention needed at this time and they will consult in the morning.  Case discussed with  hospitalist, who accepts patient for admission.  Septic work-up pending however no obvious infection at this time and will hold off on antibiotics.      ____________________________________________   FINAL CLINICAL IMPRESSION(S) / ED DIAGNOSES  Final diagnoses:  Upper GI bleed  Symptomatic anemia  Altered mental status, unspecified altered mental status type  Coagulopathy [facility])     ED Discharge Orders    None       Note:  This document was prepared using  Dragon voice recognition software and may include unintentional dictation errors.   Blake Divine, MD 08/08/19 [phone]

## 2019-08-08 NOTE — ED Notes (Signed)
EDP [place] notified of critical hgb/hct.

## 2019-08-08 NOTE — ED Triage Notes (Signed)
Pt in via [facility] from [facility] d/t AMS and weakness. Facility reports pt usually A&Ox3 and can walk. States today pt stopped answering questions and wouldn't walk. BG WDL per EMS. Pt on 2L O2 chronic per EMS. Pt pale. Pt's legs edematous. Pt speaking; some words comprehensible and others not.

## 2019-08-08 NOTE — ED Notes (Signed)
Lav tube sent to lab per lab request.

## 2019-08-08 NOTE — Progress Notes (Signed)
MEDICATION RELATED CONSULT NOTE - INITIAL   Pharmacy Consult for [facility]) Indication: GI Bleed s/t warfarin overdose and INR > 10.0  No Known Allergies  Patient Measurements: Height: 5\' 4"  (162.6 cm) Weight: (!) 304 lb 3.8 oz (138 kg) IBW/kg (Calculated) : 54.7  Vital Signs: Temp: 98.7 F (37.1 C) (11/07 2153) Temp Source: Rectal (11/07 2153) BP: 100/58 (11/07 2228) Pulse Rate: 65 (11/07 2230) Intake/Output from previous day: No intake/output data recorded. Intake/Output from this shift: No intake/output data recorded.  Labs: Recent Labs    08/08/19 2206  WBC 15.8*  HGB 3.9*  HCT 13.1*  PLT 367  CREATININE 2.91*  ALBUMIN 2.1*  PROT 5.7*  AST 42*  ALT 21  ALKPHOS 62  BILITOT 1.1   Estimated Creatinine Clearance: 23.6 mL/min (A) (by C-G formula based on SCr of 2.91 mg/dL (H)).   Microbiology: No results found for this or any previous visit (from the past 720 hour(s)).  Medical History: Past Medical History:  Diagnosis Date  . (HFpEF) heart failure with preserved ejection fraction ([place])    a. 03/2017 Echo: EF 55-60%, no rwma, Gr1 DD, mild to mod MS, mod to sev dil LA.  [name] Arthritis   . Carotid arterial disease ([place])    a. 11/2017 CTA Head/Neck: RICA 45, LICA 60.  [name] Cerebrovascular disease    a. 11/2017 MRA: mod to sev stenosis of bilat PCA's P2 segments; b. 11/2017 CTA Head/Neck: RICA 45, LICA 60, sev bilat distal MCA branch stenoses w/ sev bilat P2 stenoses.  . CKD (chronic kidney disease), stage III   . Depression   . GERD (gastroesophageal reflux disease)   . Hyperlipidemia   . Hypertension   . Left Atrial Appendage Thrombus    a. 11/2017 TEE (performed in setting of stroke/aphasia): nl EF, mod MS, dil LA w/ heavy smoke and small LAA thrombus-->Warfarin initiated.  . Mitral stenosis    a. Mild to moderate by echo 03/2017; b. 03/2017 R/LHC: PCWP 69mmHg, LVEDP 34mmHg. Mean grad of 1mmHg. Valve area of 2.02cm^2; c. 11/2017 TEE: Mod MS.  . Morbid obesity  ([place])   . Murmur   . Non-obstructive CAD (coronary artery disease)    a. 03/2017 Cath: mild, non-obstructive CAD.  [name] OSA (obstructive sleep apnea)    a. Previously wore CPAP for several years but stopped on her own and now just uses O2 via  @ night.  [name] PAH (pulmonary artery hypertension) ([place])    a. 03/2017 RHC: PA 22mmHg.  . Seasonal allergies   . Stroke [facility])    a. 11/2017 - presented w/ aphasia->MRI acute to early subacute ischemia w/in multiple vascular territories, largest in R frontal & L parietal lobes. Smaller foci of ischemia w/in R hippocampus, R parietal lobe, and L peripheral postcentral gyrus.  . Type 2 diabetes mellitus ([place])   . Urinary incontinence     Medications:  Scheduled:  . pantoprazole (PROTONIX) IV  40 mg Intravenous Once    Assessment: Patient admitted for AMS and weakness, and found to be pale w/ a hgb of 3.9. Patient is anticoagulated PTA w/ warfarin d/t h/o L atrial appendage thrombus. Patient is being ordered Kcentra and IV vitamin K 10 mg x 1.  11/07 INR > 10.0  Goal of Therapy:  Reversal of GI bleed and normalization of INR < 1.5 (per study by Voils et al that found higher mortality in patients w/ INR > 1.5 regardless of [place] type used)  Plan:  Patient is having a  GI bleed w/o signs of bleeding from any other areas including the head. Therefore, will utilize the fixed dose approach of Kcentra 1500 units IV x 1. Will monitor INRs per protocol. Will monitor patient's clinical scenario including normalization of hgb w/ blood transfusions.  Tobie Lords, PharmD, BCPS Clinical Pharmacist 08/08/2019,11:41 PM

## 2019-08-09 DIAGNOSIS — D689 Coagulation defect, unspecified: Secondary | ICD-10-CM | POA: Diagnosis present

## 2019-08-09 DIAGNOSIS — I050 Rheumatic mitral stenosis: Secondary | ICD-10-CM | POA: Diagnosis present

## 2019-08-09 DIAGNOSIS — N184 Chronic kidney disease, stage 4 (severe): Secondary | ICD-10-CM | POA: Diagnosis present

## 2019-08-09 DIAGNOSIS — R578 Other shock: Secondary | ICD-10-CM | POA: Diagnosis present

## 2019-08-09 DIAGNOSIS — I482 Chronic atrial fibrillation, unspecified: Secondary | ICD-10-CM | POA: Diagnosis present

## 2019-08-09 DIAGNOSIS — N179 Acute kidney failure, unspecified: Secondary | ICD-10-CM | POA: Diagnosis present

## 2019-08-09 DIAGNOSIS — D62 Acute posthemorrhagic anemia: Secondary | ICD-10-CM | POA: Diagnosis present

## 2019-08-09 DIAGNOSIS — G9341 Metabolic encephalopathy: Secondary | ICD-10-CM | POA: Diagnosis present

## 2019-08-09 LAB — URINALYSIS, COMPLETE (UACMP) WITH MICROSCOPIC
Bilirubin Urine: NEGATIVE
Glucose, UA: NEGATIVE mg/dL
Hgb urine dipstick: NEGATIVE
Ketones, ur: NEGATIVE mg/dL
Leukocytes,Ua: NEGATIVE
Nitrite: NEGATIVE
Protein, ur: NEGATIVE mg/dL
Specific Gravity, Urine: 1.015 (ref 1.005–1.030)
pH: 7 (ref 5.0–8.0)

## 2019-08-09 LAB — BASIC METABOLIC PANEL
Anion gap: 10 (ref 5–15)
BUN: 74 mg/dL — ABNORMAL HIGH (ref 8–23)
CO2: 32 mmol/L (ref 22–32)
Calcium: 8 mg/dL — ABNORMAL LOW (ref 8.9–10.3)
Chloride: 98 mmol/L (ref 98–111)
Creatinine, Ser: 2.89 mg/dL — ABNORMAL HIGH (ref 0.44–1.00)
GFR calc Af Amer: 18 mL/min — ABNORMAL LOW (ref >60)
GFR calc non Af Amer: 15 mL/min — ABNORMAL LOW (ref >60)
Glucose, Bld: 73 mg/dL (ref 70–99)
Potassium: 3.7 mmol/L (ref 3.5–5.1)
Sodium: 140 mmol/L (ref 135–145)

## 2019-08-09 LAB — SARS CORONAVIRUS 2 (TAT 6-24 HRS): SARS Coronavirus 2: POSITIVE — AB

## 2019-08-09 LAB — CBC
HCT: 17.5 % — ABNORMAL LOW (ref 36.0–46.0)
Hemoglobin: 5.6 g/dL — ABNORMAL LOW (ref 12.0–15.0)
MCH: 28.3 pg (ref 26.0–34.0)
MCHC: 32 g/dL (ref 30.0–36.0)
MCV: 88.4 fL (ref 80.0–100.0)
Platelets: 332 10*3/uL (ref 150–400)
RBC: 1.98 MIL/uL — ABNORMAL LOW (ref 3.87–5.11)
RDW: 14.6 % (ref 11.5–15.5)
WBC: 15.4 10*3/uL — ABNORMAL HIGH (ref 4.0–10.5)
nRBC: 0.5 % — ABNORMAL HIGH (ref 0.0–0.2)

## 2019-08-09 LAB — PROTIME-INR
INR: 1.4 — ABNORMAL HIGH (ref 0.8–1.2)
INR: 1.3 — ABNORMAL HIGH (ref 0.8–1.2)
Prothrombin Time: 16.6 seconds — ABNORMAL HIGH (ref 11.4–15.2)
Prothrombin Time: 15.8 seconds — ABNORMAL HIGH (ref 11.4–15.2)

## 2019-08-09 LAB — PREPARE RBC (CROSSMATCH)

## 2019-08-09 LAB — TSH: TSH: 3.914 u[IU]/mL (ref 0.350–4.500)

## 2019-08-09 MED ORDER — ATORVASTATIN CALCIUM 20 MG PO TABS: 40.0000 mg | ORAL_TABLET | Freq: Every day | ORAL | Status: DC

## 2019-08-09 MED ORDER — ACETAMINOPHEN 650 MG RE SUPP
650.0000 mg | Freq: Four times a day (QID) | RECTAL | Status: DC | PRN
  Administered 2019-08-09: 02:00:00 650 mg via RECTAL
  Filled 2019-08-09: qty 1

## 2019-08-09 MED ORDER — DIPHENHYDRAMINE HCL 25 MG PO CAPS: 25.0000 mg | ORAL_CAPSULE | Freq: Once | ORAL | Status: DC

## 2019-08-09 MED ORDER — SUCRALFATE 1 G PO TABS: 1.0000 g | ORAL_TABLET | Freq: Three times a day (TID) | ORAL | Status: DC

## 2019-08-09 MED ORDER — SODIUM CHLORIDE 0.9% IV SOLUTION
Freq: Once | INTRAVENOUS | Status: DC
  Filled 2019-08-09: qty 250

## 2019-08-09 MED ORDER — SODIUM CHLORIDE 0.9 % IV SOLN: INTRAVENOUS | Status: DC

## 2019-08-09 MED ORDER — ADULT MULTIVITAMIN W/MINERALS CH: 1.0000 | ORAL_TABLET | Freq: Every day | ORAL | Status: DC

## 2019-08-09 MED ORDER — CYANOCOBALAMIN 500 MCG PO TABS
250.0000 ug | ORAL_TABLET | Freq: Every day | ORAL | Status: DC
  Filled 2019-08-09 (×2): qty 1

## 2019-08-09 MED ORDER — PANTOPRAZOLE SODIUM 40 MG IV SOLR: 40.0000 mg | Freq: Two times a day (BID) | INTRAVENOUS | Status: DC

## 2019-08-09 MED ORDER — MELATONIN 5 MG PO TABS
5.0000 mg | ORAL_TABLET | Freq: Every day | ORAL | Status: DC
  Filled 2019-08-09 (×2): qty 1

## 2019-08-09 MED ORDER — SODIUM CHLORIDE 0.9 % IV SOLN: Freq: Once | INTRAVENOUS | Status: DC

## 2019-08-09 MED ORDER — SODIUM CHLORIDE 0.9 % IV SOLN
8.0000 mg/h | INTRAVENOUS | Status: DC
  Administered 2019-08-09 (×2): 8 mg/h via INTRAVENOUS
  Filled 2019-08-09 (×2): qty 80

## 2019-08-09 MED ORDER — TRAZODONE HCL 50 MG PO TABS: 25.0000 mg | ORAL_TABLET | Freq: Every evening | ORAL | Status: DC | PRN

## 2019-08-09 MED ORDER — INSULIN GLARGINE 100 UNIT/ML ~~LOC~~ SOLN
7.0000 [IU] | Freq: Every evening | SUBCUTANEOUS | Status: DC
  Filled 2019-08-09 (×2): qty 0.07

## 2019-08-09 MED ORDER — HYDROCODONE-ACETAMINOPHEN 5-325 MG PO TABS: 1.0000 | ORAL_TABLET | Freq: Four times a day (QID) | ORAL | Status: DC | PRN

## 2019-08-09 MED ORDER — HYDRALAZINE HCL 50 MG PO TABS: 50.0000 mg | ORAL_TABLET | Freq: Four times a day (QID) | ORAL | Status: DC

## 2019-08-09 MED ORDER — PEG 3350-KCL-NA BICARB-NACL 420 G PO SOLR
4000.0000 mL | Freq: Once | ORAL | Status: DC
  Filled 2019-08-09: qty 4000

## 2019-08-09 MED ORDER — PANTOPRAZOLE SODIUM 40 MG IV SOLR
40.0000 mg | INTRAVENOUS | Status: AC
  Administered 2019-08-09 (×2): 40 mg via INTRAVENOUS
  Filled 2019-08-09 (×2): qty 40

## 2019-08-09 MED ORDER — PANTOPRAZOLE SODIUM 40 MG IV SOLR: 40.0000 mg | Freq: Once | INTRAVENOUS | Status: DC

## 2019-08-09 MED ORDER — DOCUSATE SODIUM 100 MG PO CAPS: 100.0000 mg | ORAL_CAPSULE | Freq: Two times a day (BID) | ORAL | Status: DC

## 2019-08-09 MED ORDER — DIPHENHYDRAMINE HCL 50 MG/ML IJ SOLN
25.0000 mg | Freq: Once | INTRAMUSCULAR | Status: AC
  Administered 2019-08-09: 02:00:00 25 mg via INTRAVENOUS
  Filled 2019-08-09: qty 1

## 2019-08-09 MED ORDER — FUROSEMIDE 10 MG/ML IJ SOLN
20.0000 mg | Freq: Once | INTRAMUSCULAR | Status: AC
  Administered 2019-08-09: 05:00:00 20 mg via INTRAVENOUS
  Filled 2019-08-09: qty 4

## 2019-08-09 MED ORDER — SACCHAROMYCES BOULARDII 250 MG PO CAPS
250.0000 mg | ORAL_CAPSULE | Freq: Two times a day (BID) | ORAL | Status: DC
  Filled 2019-08-09 (×4): qty 1

## 2019-08-09 MED ORDER — ONDANSETRON HCL 4 MG/2ML IJ SOLN: 4.0000 mg | Freq: Four times a day (QID) | INTRAMUSCULAR | Status: DC | PRN

## 2019-08-09 MED ORDER — ALUM & MAG HYDROXIDE-SIMETH 200-200-20 MG/5ML PO SUSP: 10.0000 mL | Freq: Four times a day (QID) | ORAL | Status: DC | PRN

## 2019-08-09 MED ORDER — CARVEDILOL 25 MG PO TABS: 50.0000 mg | ORAL_TABLET | Freq: Two times a day (BID) | ORAL | Status: DC

## 2019-08-09 MED ORDER — ONDANSETRON HCL 4 MG PO TABS: 4.0000 mg | ORAL_TABLET | Freq: Four times a day (QID) | ORAL | Status: DC | PRN

## 2019-08-09 MED ORDER — ACETAMINOPHEN 325 MG PO TABS: 650.0000 mg | ORAL_TABLET | Freq: Once | ORAL | Status: DC

## 2019-08-09 MED ORDER — ACETAMINOPHEN 325 MG PO TABS: 650.0000 mg | ORAL_TABLET | Freq: Four times a day (QID) | ORAL | Status: DC | PRN

## 2019-08-09 MED ORDER — OMEGA-3-ACID ETHYL ESTERS 1 G PO CAPS
1.0000 g | ORAL_CAPSULE | Freq: Every day | ORAL | Status: DC
  Filled 2019-08-09 (×2): qty 1

## 2019-08-09 MED ORDER — ROPINIROLE HCL 0.25 MG PO TABS
0.2500 mg | ORAL_TABLET | Freq: Every day | ORAL | Status: DC
  Filled 2019-08-09 (×2): qty 1

## 2019-08-09 MED ORDER — FLUOXETINE HCL 20 MG PO CAPS: 40.0000 mg | ORAL_CAPSULE | Freq: Every day | ORAL | Status: DC

## 2019-08-09 NOTE — ED Notes (Addendum)
Pt mental status , responds to simple verbal commands, acknowledges her name when you address. Continuously clears her throat , even unlabored respiration , clear lung sounds

## 2019-08-09 NOTE — ED Notes (Signed)
Secure message sent to Dr Maryland Pink about pt's covid status

## 2019-08-09 NOTE — ED Notes (Addendum)
NT should be back with blood soon. Will start once blood at bedside. Pt unable to sign for blood but gave verbal consent with this RN and Corporate investment banker at bedside.

## 2019-08-09 NOTE — Plan of Care (Signed)
RN requested me to order [facility] Xxx since blood bank was not able to verify order for total 4 PRBC and pt has 2 PRBC already. Order placed for the [facility]

## 2019-08-09 NOTE — ED Notes (Signed)
Date and time results received: 08/09/19 23:05 (use smartphrase ".now" to insert current time)  Test: INR Critical Value: <10  Name of Provider Notified: Dr. Charna Archer  Orders Received? Or Actions Taken?: acknowledged

## 2019-08-09 NOTE — ED Notes (Signed)
Pt cleansed of urine by this RN, Deneise Lever RN, and [employer] representative- pt repositioned in bed

## 2019-08-09 NOTE — ED Notes (Addendum)
Called pharm to switch Benadryl to IV to give immediately. Pharm also verbalized they would adjust protonix gtt as needed.

## 2019-08-09 NOTE — Consult Note (Addendum)
GI Inpatient Consult Note  Reason for Consult: Symptomatic anemia, GIB   Attending Requesting Consult: Dr. Maryland Pink  History of Present Illness: Megan Obrien is a 73 y.o. female seen for evaluation of symptomatic anemia, guaiac positive stools at the request of Dr. Maryland Pink. Patient has a PMH of CHF, HTN, HLD, GERD, CKD Stage III, CVA, and atrial fibrillation on Warfarin who presented to the ED late last night from SNF with AMS and generalized weakness. Upon presentation to the ED, she was found to have severe anemia with hemoglobin 3.9 compared to 7.7. in April 2020. She also had leukocytosis at 15.8, platelet 367K, and INR >10 with PT >90. She was found to have guaiac positive stools. She was 1L bolus of LR, IV Protonix, and Vitamin K and Kcentra. GI was consulted for further management of symptomatic anemia and guaiac positive stools.   Patient seen and examined in ED stretcher. Patient is alert and oriented only to person. She reports she does not know why she is here. She is not a good historian and is not able to contribute much to the history. She denies any overt abdominal pain, nausea, vomiting, chest pain, or dizziness. INR this morning 1.4 after receiving Kcentra and Vitamin K. She received a total of 2 units pRBCs and hemoglobin this morning up to 5.6. Per nursing, there have been no BMs since being in the ED. No overt signs of bleeding. Upon chart review, EGD and colon performed 06/2011 with indications of anemia showed pre-pyloric ulcer and diverticulosis  Last Colonoscopy: 06/2011 Last Endoscopy: 06/2011   Past Medical History:  Past Medical History:  Diagnosis Date  . (HFpEF) heart failure with preserved ejection fraction ([place])    a. 03/2017 Echo: EF 55-60%, no rwma, Gr1 DD, mild to mod MS, mod to sev dil LA.  [name] Arthritis   . Carotid arterial disease ([place])    a. 11/2017 CTA Head/Neck: RICA 45, LICA 60.  [name] Cerebrovascular disease    a. 11/2017 MRA: mod to sev stenosis of  bilat PCA's P2 segments; b. 11/2017 CTA Head/Neck: RICA 45, LICA 60, sev bilat distal MCA branch stenoses w/ sev bilat P2 stenoses.  . CKD (chronic kidney disease), stage III   . Depression   . GERD (gastroesophageal reflux disease)   . Hyperlipidemia   . Hypertension   . Left Atrial Appendage Thrombus    a. 11/2017 TEE (performed in setting of stroke/aphasia): nl EF, mod MS, dil LA w/ heavy smoke and small LAA thrombus-->Warfarin initiated.  . Mitral stenosis    a. Mild to moderate by echo 03/2017; b. 03/2017 R/LHC: PCWP 15mmHg, LVEDP 71mmHg. Mean grad of 91mmHg. Valve area of 2.02cm^2; c. 11/2017 TEE: Mod MS.  . Morbid obesity ([place])   . Murmur   . Non-obstructive CAD (coronary artery disease)    a. 03/2017 Cath: mild, non-obstructive CAD.  [name] OSA (obstructive sleep apnea)    a. Previously wore CPAP for several years but stopped on her own and now just uses O2 via [place] @ night.  [name] PAH (pulmonary artery hypertension) ([place])    a. 03/2017 RHC: PA 65mmHg.  . Seasonal allergies   . Stroke [facility])    a. 11/2017 - presented w/ aphasia->MRI acute to early subacute ischemia w/in multiple vascular territories, largest in R frontal & L parietal lobes. Smaller foci of ischemia w/in R hippocampus, R parietal lobe, and L peripheral postcentral gyrus.  . Type 2 diabetes mellitus ([place])   . Urinary incontinence  Problem List: Patient Active Problem List   Diagnosis Date Noted  . Morbid obesity ([place])   . CKD (chronic kidney disease), stage IV ([place])   . AKI (acute kidney injury) ([place])   . Hemorrhagic shock ([place])   . Acute blood loss anemia   . Coagulopathy ([place])   . Moderate mitral stenosis   . Atrial fibrillation, chronic ([place])   . Acute metabolic encephalopathy   . GI bleed 08/08/2019  . Sepsis ([place]) 01/01/2019  . History of medication noncompliance 05/26/2018  . Vaginal bleeding 02/06/2018  . Expressive aphasia 12/18/2017  . Stroke ([place]) 12/18/2017  . Mitral valve disease   . Exertional dyspnea  04/17/2017  . Fatigue 02/20/2017  . 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 Medicare annual wellness visit, subsequent 07/24/2016  . Hyperlipidemia 02/06/2016  . Essential hypertension 02/06/2016  . Type 2 diabetes mellitus with complication, without long-term current use of insulin ([place]) 02/06/2016  . Depression 02/06/2016  . GERD (gastroesophageal reflux disease) 02/06/2016  . Urge incontinence of urine 02/06/2016    Past Surgical History: Past Surgical History:  Procedure Laterality Date  . BACK SURGERY    . CARDIAC CATHETERIZATION    . KNEE SURGERY Left   . RIGHT/LEFT HEART CATH AND CORONARY ANGIOGRAPHY Bilateral 04/25/2017   Procedure: Right/Left Heart Cath and Coronary Angiography;  Surgeon: Wellington Hampshire, MD;  Location: [facility];  Service: Cardiovascular;  Laterality: Bilateral;  . TEE WITHOUT CARDIOVERSION N/A 12/20/2017   Procedure: TRANSESOPHAGEAL ECHOCARDIOGRAM (TEE);  Surgeon: Wellington Hampshire, MD;  Location: [facility] ORS;  Service: Cardiovascular;  Laterality: N/A;  . TONSILLECTOMY AND ADENOIDECTOMY  1952    Allergies: No Known Allergies  Home Medications: (Not in a hospital admission)  Home medication reconciliation was completed with the patient.   Scheduled Inpatient Medications:   . acetaminophen  650 mg Oral Once  . atorvastatin  40 mg Oral QHS  . carvedilol  50 mg Oral BID WC  . docusate sodium  100 mg Oral BID  . FLUoxetine  40 mg Oral Daily  . hydrALAZINE  50 mg Oral QID  . insulin glargine  7 Units Subcutaneous QPM  . Melatonin  5 mg Oral QHS  . multivitamin with minerals  1 tablet Oral Daily  . omega-3 acid ethyl esters  1 g Oral Daily  . [START ON 08/12/2019] pantoprazole  40 mg Intravenous Q12H  . rOPINIRole  0.25 mg Oral QHS  . saccharomyces boulardii  250 mg Oral BID  . sucralfate  1 g Oral TID WC & HS  . vitamin B-12  250 mcg Oral Daily    Continuous Inpatient Infusions:   . sodium chloride    . sodium chloride    . pantoprozole (PROTONIX)  infusion 8 mg/hr (08/09/19 0424)    PRN Inpatient Medications:  acetaminophen **OR** acetaminophen, alum & mag hydroxide-simeth, HYDROcodone-acetaminophen, traZODone  Family History: family history includes Alzheimer's disease in her mother; Lung cancer in her father; Stroke in her maternal grandfather.  The patient's family history is negative for inflammatory bowel disorders, GI malignancy, or solid organ transplantation.  Social History:   reports that she has never smoked. She has never used smokeless tobacco. She reports that she does not drink alcohol or use drugs. The patient denies ETOH, tobacco, or drug use.   Review of Systems:  Unable to obtain due to patient's mental status   Physical Examination: BP (!) 117/50   Pulse 63   Temp 97.7 F (36.5 C) (Oral)   Resp  19   Ht 5\' 4"  (1.626 m)   Wt (!) 138 kg   SpO2 95%   BMI 52.22 kg/m  Pleasant, obese female in ED stretcher. No accessory muscle use.  Gen: NAD, alert and oriented x 4 HEENT: PEERLA, EOMI, Neck: supple, no JVD or thyromegaly Chest: CTA bilaterally, no wheezes, crackles, or other adventitious sounds CV: RRR, no m/g/c/r Abd: soft, obese abdomen, hypoactive BS in all four quadrants; no HSM, guarding, ridigity, or rebound tenderness Ext: no edema, well perfused with 2+ pulses, Skin: no rash or lesions noted, +skin pallor Lymph: no LAD  Data: Lab Results  Component Value Date   WBC 15.4 (H) 08/09/2019   HGB 5.6 (L) 08/09/2019   HCT 17.5 (L) 08/09/2019   MCV 88.4 08/09/2019   PLT 332 08/09/2019   Recent Labs  Lab 08/08/19 2206 08/09/19 0751  HGB 3.9* 5.6*   Lab Results  Component Value Date   NA 140 08/09/2019   K 3.7 08/09/2019   CL 98 08/09/2019   CO2 32 08/09/2019   BUN 74 (H) 08/09/2019   CREATININE 2.89 (H) 08/09/2019   Lab Results  Component Value Date   ALT 21 08/08/2019   AST 42 (H) 08/08/2019   ALKPHOS 62 08/08/2019   BILITOT 1.1 08/08/2019   Recent Labs  Lab 08/09/19 0751   INR 1.4*   Assessment/Plan:  73 y/o Caucasian female with a PMH of CHF, HTN, HLD, GERD, CKD Stage III, CVA, and atrial fibrillation on Warfarin who presented to the ED this morning from SNF with AMS and generalized weakness  1. Symptomatic anemia 2. Guaiac positive stools 3. Coagulopathic 2/2 supratherapeutic NR >10 on presentation to the ED. Has received Vitamin K and Kcentra. INR 1.4 this morning 4. Acute on chronic kidney disease 5. Atrial fibrillation on Warfarin  6. Morbid obesity  -Symptomatic anemia with severe acute blood loss anemia with hemoglobin down to 3.9 on presentation. She is s/p 2 units of pRBCs and hemoglobin 5.6 this morning -Continue to transfuse as necessary to keep hemoglobin >7 -Give another 2 units of pRBCs -Continue to monitor serial H&H. Transfuse for Hgb <7.0. -Agree with IV Protonix -Differential of symptomatic anemia includes peptic ulcer disease, gastritis, AVMs, esophagitis, gastritis, Dieulafoy's lesion, small bowel bleeding, colonic polyps, diverticular, malignancy, MDS, renal disease, etc -Continue to hold anticoagulation -We will plan for EGD and colonoscopy tomorrow with Dr. [name] for diagnostic and potentially therapeutic purposes. I have called and spoken with patient's daughter [facility])- and we discussed procedure details and indications. I reviewed the risks (including bleeding, perforation, infection, anesthesia complications, cardiac/respiratory complications), benefits and alternatives of EGD and colonoscopy. Patient's daughter consents to proceed.  -Recheck INR in the morning. Goal INR <1.5 -See procedure note for findings and further plan of care   Thank you for the consult. Please call with questions or concerns.  Reeves Forth [facility] [phone] (Cell)

## 2019-08-09 NOTE — ED Notes (Signed)
Per Deneise Lever RN, pt cannot tolerate taking medications d/t swallowing problems- pt awaiting speech/swallowing consult- pt is NPO at this time

## 2019-08-09 NOTE — ED Notes (Signed)
Secure message sent to [facility] concerning blood order

## 2019-08-09 NOTE — ED Notes (Signed)
Called pharmacy for another bag of protonix

## 2019-08-09 NOTE — Progress Notes (Signed)
PROGRESS NOTE  Megan Obrien EQA:[id number] DOB: [birth year] DOA: 08/08/2019 PCP: [name], MD  HPI/Recap of past 24 hours: Patient is a 73 year old female with past medical history of morbid obesity, diabetes mellitus with stage IV chronic kidney disease, moderate mitral stenosis and chronic atrial fibrillation on Coumadin brought in from her skilled nursing facility to the emergency room on 11/7 evening with weakness and confusion that had started in the past 24 hours.  In the emergency room, patient was noted to be hypotensive and found to have a hemoglobin of 3.9 and a BUN/creatinine of 72/2.91.  Lactic acid level at 1.6.  INR was greater than 10.  Patient felt to have hemorrhagic shock brought on by elevated Coumadin levels.  She was given IV Protonix, bolus of lactated Ringer and vitamin K/Kcentra to reverse her Coumadin.  She was ordered a transfusion of 4 units packed red blood cells and admitted to the hospital service.  GI was consulted.  Patient seen this morning.  She is not a very good historian and while able to interact, was not able to remember very much or offer many details.  She tells me that she does not feel very good, but cannot tell me more than that.  She could not tell me how long she has been on Coumadin.  Blood pressure has since stabilized.  Follow-up labs this morning note a creatinine of 2.89, hemoglobin of 5.6 (after 2 units) and an INR of 1.4.  GI consultation is pending.  Assessment/Plan: Principal Problem:   Hemorrhagic shock (HCC) secondary to coagulopathy/elevated Coumadin level and acute blood loss anemia: Secondary to high Coumadin levels.  One possibility may be diastolic heart failure leading to decreased Coumadin clearance causing elevated Coumadin levels.  Coumadin effects have been reversed.  Patient receiving 4 units of blood.  Appears to be starting to stabilize.  GI to see. Active Problems:   Hyperlipidemia   Essential hypertension: Blood  pressure stable following transfusion of blood.    Type 2 diabetes mellitus with complication, without long-term current use of insulin ([place]): On sliding scale.    Morbid obesity [facility]): Patient meets criteria BMI greater than 40.  Acute kidney injury in the setting of CKD (chronic kidney disease), stage IV ([place]): Elevated from baseline.  Should improve with blood.  This is secondary to blood loss anemia    Moderate mitral stenosis: Chronic, precludes echo from reading diastolic dysfunction.  I suspect that she does have volume overload currently.    Atrial fibrillation, chronic ([place]): Has stopped her Coumadin.  Rate controlled.  Acute metabolic encephalopathy: Unsure of her baseline if she has underlying dementia.  Encephalopathy acutely secondary to blood loss anemia and shock  Code Status: Full code  Family Communication: Left message with family  Disposition Plan: Continue in stepdown, waiting for bleeding to felt to be stabilized, GI consultation, improvement in renal function   Consultants:  Gastroenterology  Procedures:  None  Antimicrobials:  None  DVT prophylaxis: SCDs   Objective: Vitals:   08/09/19 1120 08/09/19 1308  BP:    Pulse: (!) 58 63  Resp: 17 19  Temp:    SpO2: 100% 95%    Intake/Output Summary (Last 24 hours) at 08/09/2019 1324 Last data filed at 08/09/2019 0839 Gross per 24 hour  Intake 720 ml  Output -  Net 720 ml   Filed Weights   08/08/19 2158  Weight: (!) 138 kg   Body mass index is 52.22 kg/m.  Exam:  General: Awake, oriented x1, no acute distress  HEENT: Normocephalic and atraumatic, mucous membranes are dry  Neck: Thick, narrow airway  Cardiovascular: Irregular rhythm, rate controlled  Respiratory: Clear to auscultation bilaterally, limited secondary to body habitus  Abdomen: Soft, obese, nontender, positive bowel sounds  Musculoskeletal: No clubbing or cyanosis, trace pitting edema  Skin: No skin breaks, tears  or lesions  Psychiatry: Confused, some of this may be chronic.  No evidence of acute psychoses   Data Reviewed: CBC: Recent Labs  Lab 08/08/19 2206 08/09/19 0751  WBC 15.8* 15.4*  NEUTROABS 13.8*  --   HGB 3.9* 5.6*  HCT 13.1* 17.5*  MCV 90.3 88.4  PLT 367 950   Basic Metabolic Panel: Recent Labs  Lab 08/08/19 2206 08/09/19 0751  NA 139 140  K 3.6 3.7  CL 97* 98  CO2 31 32  GLUCOSE 78 73  BUN 72* 74*  CREATININE 2.91* 2.89*  CALCIUM 8.0* 8.0*   GFR: Estimated Creatinine Clearance: 23.7 mL/min (A) (by C-G formula based on SCr of 2.89 mg/dL (H)). Liver Function Tests: Recent Labs  Lab 08/08/19 2206  AST 42*  ALT 21  ALKPHOS 62  BILITOT 1.1  PROT 5.7*  ALBUMIN 2.1*   No results for input(s): LIPASE, AMYLASE in the last 168 hours. No results for input(s): AMMONIA in the last 168 hours. Coagulation Profile: Recent Labs  Lab 08/08/19 2206 08/09/19 0751  INR >10.0* 1.4*   Cardiac Enzymes: No results for input(s): CKTOTAL, CKMB, CKMBINDEX, TROPONINI in the last 168 hours. BNP (last 3 results) No results for input(s): PROBNP in the last 8760 hours. HbA1C: No results for input(s): HGBA1C in the last 72 hours. CBG: No results for input(s): GLUCAP in the last 168 hours. Lipid Profile: No results for input(s): CHOL, HDL, LDLCALC, TRIG, CHOLHDL, LDLDIRECT in the last 72 hours. Thyroid Function Tests: Recent Labs    08/09/19 0751  TSH 3.914   Anemia Panel: No results for input(s): VITAMINB12, FOLATE, FERRITIN, TIBC, IRON, RETICCTPCT in the last 72 hours. Urine analysis:    Component Value Date/Time   COLORURINE YELLOW (A) 08/08/2019 2206   APPEARANCEUR TURBID (A) 08/08/2019 2206   LABSPEC 1.015 08/08/2019 2206   PHURINE 7.0 08/08/2019 2206   GLUCOSEU NEGATIVE 08/08/2019 2206   HGBUR NEGATIVE 08/08/2019 2206   BILIRUBINUR NEGATIVE 08/08/2019 2206   BILIRUBINUR negative 07/24/2016 1303   KETONESUR NEGATIVE 08/08/2019 2206   PROTEINUR NEGATIVE  08/08/2019 2206   UROBILINOGEN negative 07/24/2016 1303   NITRITE NEGATIVE 08/08/2019 2206   LEUKOCYTESUR NEGATIVE 08/08/2019 2206   Sepsis Labs: @LABRCNTIP (procalcitonin:4,lacticidven:4)  ) Recent Results (from the past 240 hour(s))  Culture, blood (routine x 2)     Status: None (Preliminary result)   Collection Time: 08/08/19 10:06 PM   Specimen: BLOOD  Result Value Ref Range Status   Specimen Description BLOOD LEFT ANTECUBITAL  Final   Special Requests   Final    BOTTLES DRAWN AEROBIC AND ANAEROBIC Blood Culture adequate volume   Culture   Final    NO GROWTH < 12 HOURS Performed at [facility], [address], [place], [place] [address]    Report Status PENDING  Incomplete  Culture, blood (routine x 2)     Status: None (Preliminary result)   Collection Time: 08/08/19 10:07 PM   Specimen: BLOOD RIGHT HAND  Result Value Ref Range Status   Specimen Description BLOOD RIGHT HAND  Final   Special Requests   Final    BOTTLES DRAWN AEROBIC AND  ANAEROBIC Blood Culture adequate volume   Culture   Final    NO GROWTH < 12 HOURS Performed at [facility], [address] [place], [place] [address]    Report Status PENDING  Incomplete      Studies: Dg Chest Portable 1 View  Result Date: 08/08/2019 CLINICAL DATA:  Initial evaluation for acute altered mental status. EXAM: PORTABLE CHEST 1 VIEW COMPARISON:  Prior radiograph from 01/01/2019. FINDINGS: Cardiomegaly, grossly stable from previous. Mediastinal silhouette within normal limits. Aortic atherosclerosis. Lungs mildly hypoinflated. Diffuse pulmonary vascular congestion with interstitial prominence compatible with mild pulmonary interstitial edema. Superimposed dense opacity at the retrocardiac left lower lobe could reflect atelectasis or infiltrate or possibly effusion. Suspected small left pleural effusion. No pneumothorax. No acute osseous finding. Prominent degenerative changes noted about the shoulders  bilaterally. IMPRESSION: 1. Cardiomegaly with mild diffuse pulmonary interstitial edema. Probable small left pleural effusion. 2. Superimposed dense opacity at the retrocardiac left lower lobe, which may reflect atelectasis, infiltrate, or effusion. 3.  Aortic Atherosclerosis (ICD10-I70.0). Electronically Signed   By: Jeannine Boga M.D.   On: 08/08/2019 22:29    Scheduled Meds: . acetaminophen  650 mg Oral Once  . atorvastatin  40 mg Oral QHS  . carvedilol  50 mg Oral BID WC  . docusate sodium  100 mg Oral BID  . FLUoxetine  40 mg Oral Daily  . hydrALAZINE  50 mg Oral QID  . insulin glargine  7 Units Subcutaneous QPM  . Melatonin  5 mg Oral QHS  . multivitamin with minerals  1 tablet Oral Daily  . omega-3 acid ethyl esters  1 g Oral Daily  . [START ON 08/12/2019] pantoprazole  40 mg Intravenous Q12H  . rOPINIRole  0.25 mg Oral QHS  . saccharomyces boulardii  250 mg Oral BID  . sucralfate  1 g Oral TID WC & HS  . vitamin B-12  250 mcg Oral Daily    Continuous Infusions: . sodium chloride    . sodium chloride    . pantoprozole (PROTONIX) infusion 8 mg/hr (08/09/19 0424)     LOS: 1 day     Annita Brod, MD [facility]  To reach me or the doctor on call, go to: [email or web address] Password [email or web address]  08/09/2019, 1:24 PM

## 2019-08-09 NOTE — ED Notes (Signed)
Daughter given an update

## 2019-08-09 NOTE — ED Notes (Signed)
Briefs wet with urine. Removed. Peri care provided. Bed pad changed.

## 2019-08-09 NOTE — H&P (Signed)
[facility] [place] NAME: Megan Obrien    MR#:  [id number]  DATE OF BIRTH:  08/28/1946  DATE OF ADMISSION:  08/08/2019  PRIMARY CARE PHYSICIAN: [name], MD   REQUESTING/REFERRING PHYSICIAN: Blake Divine, MD  CHIEF COMPLAINT:   Chief Complaint  Patient presents with  . Altered Mental Status  . Weakness   Patient was seen and examined on 08/08/2019 HISTORY OF PRESENT ILLNESS:  Megan Obrien  is a 73 y.o. obese Caucasian female with a known history of multiple medical problems that were mentioned below, including CHF, hypertension, dyslipidemia, GERD, stage III chronic kidney disease, CVA and atrial fibrillation on Coumadin who [facility], with acute onset of altered mental status with generalized weakness.  She is usually ambulatory and alert oriented x3.  She has had a significant decline in her mental status over the last 24 hours and is no longer ambulating and less interactive.  During my interview the patient was not appropriately answering any questions but basically moaning most of the time.  No reported fever or chills.  No reported nausea or vomiting or abdominal pain.  No reported chest pain or dyspnea or cough or wheezing or hemoptysis.  Upon presentation to the emergency room, blood pressure was 114/49 with respiratory to 25 and otherwise normal vital signs.  Labs revealed severe anemia with hemoglobin of 3.9 hematocrit 13.1 compared to 7.7 and 25.9 on 01/03/2019 and WBC was 15.8 with platelets of 367 and neutrophils of 87.  CMP revealed a BUN of 72 and creatinine 2.91 compared to 42 and 1.86 on 01/04/2019.  Lactic acid was 1.6.  INR was more than 10 on Coumadin with PT more than 90.  COVID-19 test is currently pending.  Stool Hemoccult was guaiac positive.  The patient was given 1 L bolus of IV lactated ringer, 40 mg of IV Protonix, 10 mg of IV vitamin K and Kcentra 1500  units.  Dr. [name] was notified about the patient for GI consultation.  The patient will be admitted to a stepdown bed for further evaluation and management.  PAST MEDICAL HISTORY:   Past Medical History:  Diagnosis Date  . (HFpEF) heart failure with preserved ejection fraction ([place])    a. 03/2017 Echo: EF 55-60%, no rwma, Gr1 DD, mild to mod MS, mod to sev dil LA.  [name] Arthritis   . Carotid arterial disease ([place])    a. 11/2017 CTA Head/Neck: RICA 45, LICA 60.  [name] Cerebrovascular disease    a. 11/2017 MRA: mod to sev stenosis of bilat PCA's P2 segments; b. 11/2017 CTA Head/Neck: RICA 45, LICA 60, sev bilat distal MCA branch stenoses w/ sev bilat P2 stenoses.  . CKD (chronic kidney disease), stage III   . Depression   . GERD (gastroesophageal reflux disease)   . Hyperlipidemia   . Hypertension   . Left Atrial Appendage Thrombus    a. 11/2017 TEE (performed in setting of stroke/aphasia): nl EF, mod MS, dil LA w/ heavy smoke and small LAA thrombus-->Warfarin initiated.  . Mitral stenosis    a. Mild to moderate by echo 03/2017; b. 03/2017 R/LHC: PCWP 80mmHg, LVEDP 81mmHg. Mean grad of 90mmHg. Valve area of 2.02cm^2; c. 11/2017 TEE: Mod MS.  . Morbid obesity ([place])   . Murmur   . Non-obstructive CAD (coronary artery disease)    a. 03/2017 Cath: mild, non-obstructive CAD.  [name] OSA (obstructive sleep apnea)    a.  Previously wore CPAP for several years but stopped on her own and now just uses O2 via [place] @ night.  [name] PAH (pulmonary artery hypertension) ([place])    a. 03/2017 RHC: PA 12mmHg.  . Seasonal allergies   . Stroke [facility])    a. 11/2017 - presented w/ aphasia->MRI acute to early subacute ischemia w/in multiple vascular territories, largest in R frontal & L parietal lobes. Smaller foci of ischemia w/in R hippocampus, R parietal lobe, and L peripheral postcentral gyrus.  . Type 2 diabetes mellitus ([place])   . Urinary incontinence     PAST SURGICAL HISTORY:   Past Surgical History:  Procedure Laterality  Date  . BACK SURGERY    . CARDIAC CATHETERIZATION    . KNEE SURGERY Left   . RIGHT/LEFT HEART CATH AND CORONARY ANGIOGRAPHY Bilateral 04/25/2017   Procedure: Right/Left Heart Cath and Coronary Angiography;  Surgeon: Wellington Hampshire, MD;  Location: [facility];  Service: Cardiovascular;  Laterality: Bilateral;  . TEE WITHOUT CARDIOVERSION N/A 12/20/2017   Procedure: TRANSESOPHAGEAL ECHOCARDIOGRAM (TEE);  Surgeon: Wellington Hampshire, MD;  Location: [facility];  Service: Cardiovascular;  Laterality: N/A;  . TONSILLECTOMY AND ADENOIDECTOMY  1951    SOCIAL HISTORY:   Social History   Tobacco Use  . Smoking status: Never Smoker  . Smokeless tobacco: Never Used  Substance Use Topics  . Alcohol use: No    Alcohol/week: 0.0 standard drinks    Comment: rarely    FAMILY HISTORY:   Family History  Problem Relation Age of Onset  . Lung cancer Father   . Alzheimer's disease Mother   . Stroke Maternal Grandfather     DRUG ALLERGIES:  No Known Allergies  REVIEW OF SYSTEMS:   ROS As per history of present illness. All pertinent systems were reviewed above. Constitutional,  HEENT, cardiovascular, respiratory, GI, GU, musculoskeletal, neuro, psychiatric, endocrine,  integumentary and hematologic systems were reviewed and are otherwise  negative/unremarkable except for positive findings mentioned above in the HPI.   MEDICATIONS AT HOME:   Prior to Admission medications   Medication Sig Start Date End Date Taking? Authorizing Provider  acetaminophen (TYLENOL) 650 MG CR tablet Take 650 mg by mouth every 8 (eight) hours as needed for pain.   Yes [provider]  alum & mag hydroxide-simeth (MAALOX/MYLANTA) 200-200-20 MG/5ML suspension Take 10 mLs by mouth every 6 (six) hours as needed for indigestion or heartburn.   Yes [provider]  atorvastatin (LIPITOR) 40 MG tablet Take 1 tablet (40 mg total) by mouth at bedtime. 08/14/18  Yes Pleas Koch, NP   carvedilol (COREG) 25 MG tablet Take 50 mg by mouth 2 (two) times daily with a meal.    Yes [provider]  diclofenac sodium (VOLTAREN) 1 % GEL Apply topically 2 (two) times a day.   Yes [provider]  docusate sodium (COLACE) 100 MG capsule Take 1 capsule (100 mg total) by mouth 2 (two) times daily. 01/05/19  Yes Sudini, Alveta Heimlich, MD  FLUoxetine (PROZAC) 40 MG capsule Take 1 capsule by mouth once daily for depression and anxiety. 11/11/18  Yes Pleas Koch, NP  furosemide (LASIX) 20 MG tablet Take 1 tablet (20 mg total) by mouth daily. Patient taking differently: Take 20 mg by mouth 2 (two) times daily.  11/25/18  Yes Dunn, Areta Haber, PA-C  glimepiride (AMARYL) 2 MG tablet Take 2 mg by mouth daily with breakfast.   Yes Warnell Forester, NP  hydrALAZINE (APRESOLINE) 25 MG  tablet Take 50 mg by mouth 4 (four) times daily.   Yes [provider]  HYDROcodone-acetaminophen (NORCO/VICODIN) 5-325 MG tablet Take 1 tablet by mouth every 6 (six) hours as needed for moderate pain.   Yes [provider]  Insulin Glargine (LANTUS) 100 UNIT/ML Solostar Pen Inject 15 Units into the skin every evening. Patient taking differently: Inject 17 Units into the skin daily.  04/09/18  Yes Pleas Koch, NP  lisinopril (ZESTRIL) 20 MG tablet Take 20 mg by mouth 2 (two) times a day.   Yes [provider]  loperamide (IMODIUM) 2 MG capsule Take by mouth as needed for diarrhea or loose stools.   Yes [provider]  magnesium oxide (MAG-OX) 400 MG tablet Take 400 mg by mouth daily.   Yes [provider]  Melatonin 5 MG TABS Take 5 mg by mouth at bedtime.   Yes [provider]  Multiple Vitamin (MULTIVITAMIN) tablet Take 1 tablet by mouth daily.   Yes [provider]  omega-3 acid ethyl esters (LOVAZA) 1 g capsule TAKE 1 CAPSULE BY MOUTH ONCE DAILY WITH FOOD FOR CHOLESTEROL 09/01/18  Yes Pleas Koch, NP  omeprazole (PRILOSEC) 40 MG  capsule Take 1 capsule (40 mg total) by mouth daily. Patient taking differently: Take 40 mg by mouth 2 (two) times a day.  11/13/18  Yes Pleas Koch, NP  rOPINIRole (REQUIP) 0.25 MG tablet Take 0.25 mg by mouth at bedtime.   Yes [provider]  saccharomyces boulardii (FLORASTOR) 250 MG capsule Take 250 mg by mouth 2 (two) times daily.   Yes [provider]  sucralfate (CARAFATE) 1 g tablet Take 1 g by mouth 4 (four) times daily -  with meals and at bedtime.   Yes [provider]  vitamin B-12 (CYANOCOBALAMIN) 250 MCG tablet Take 250 mcg by mouth daily.   Yes [provider]  warfarin (COUMADIN) 1 MG tablet Take 3.5 mg by mouth as directed. Take 1 tablet on Monday, Weds, Friday   Yes [provider]  warfarin (COUMADIN) 3 MG tablet Take 3 mg by mouth as directed. Take 1 tablet on Sunday, Tuesday, Thursday, Saturday.   Yes [provider]      VITAL SIGNS:  Blood pressure (!) 115/51, pulse 65, temperature 98.7 F (37.1 C), temperature source Rectal, resp. rate (!) 23, height 5\' 4"  (1.626 m), weight (!) 138 kg, SpO2 100 %.  PHYSICAL EXAMINATION:  Physical Exam  GENERAL:  73 y.o.-year-old patient obese Caucasian female lying in the bed, fairly confused and moaning. EYES: Pupils equal, round, reactive to light and accommodation. No scleral icterus.  Positive pallor.  Extraocular muscles intact.  HEENT: Head atraumatic, normocephalic. Oropharynx and nasopharynx clear.  NECK:  Supple, no jugular venous distention. No thyroid enlargement, no tenderness.  LUNGS: Normal breath sounds bilaterally, no wheezing, rales,rhonchi or crepitation. No use of accessory muscles of respiration.  CARDIOVASCULAR: Regular rate and rhythm, S1, S2 normal. No murmurs, rubs, or gallops.  ABDOMEN: Soft, nondistended, nontender. Bowel sounds present. No organomegaly or mass.  EXTREMITIES: 3-4+ soft bilateral lower extremity pitting edema with no cyanosis, or  clubbing.  NEUROLOGIC: No lateralizing signs.  Exam is grossly nonfocal. PSYCHIATRIC: She was fairly lethargic and moaning. SKIN: No obvious rash, lesion, or ulcer.   LABORATORY PANEL:   CBC Recent Labs  Lab 08/08/19 2206  WBC 15.8*  HGB 3.9*  HCT 13.1*  PLT 367   ------------------------------------------------------------------------------------------------------------------  Chemistries  Recent Labs  Lab 08/08/19  2206  NA 139  K 3.6  CL 97*  CO2 31  GLUCOSE 78  BUN 72*  CREATININE 2.91*  CALCIUM 8.0*  AST 42*  ALT 21  ALKPHOS 62  BILITOT 1.1   ------------------------------------------------------------------------------------------------------------------  Cardiac Enzymes No results for input(s): TROPONINI in the last 168 hours. ------------------------------------------------------------------------------------------------------------------  RADIOLOGY:  Dg Chest Portable 1 View  Result Date: 08/08/2019 CLINICAL DATA:  Initial evaluation for acute altered mental status. EXAM: PORTABLE CHEST 1 VIEW COMPARISON:  Prior radiograph from 01/01/2019. FINDINGS: Cardiomegaly, grossly stable from previous. Mediastinal silhouette within normal limits. Aortic atherosclerosis. Lungs mildly hypoinflated. Diffuse pulmonary vascular congestion with interstitial prominence compatible with mild pulmonary interstitial edema. Superimposed dense opacity at the retrocardiac left lower lobe could reflect atelectasis or infiltrate or possibly effusion. Suspected small left pleural effusion. No pneumothorax. No acute osseous finding. Prominent degenerative changes noted about the shoulders bilaterally. IMPRESSION: 1. Cardiomegaly with mild diffuse pulmonary interstitial edema. Probable small left pleural effusion. 2. Superimposed dense opacity at the retrocardiac left lower lobe, which may reflect atelectasis, infiltrate, or effusion. 3.  Aortic Atherosclerosis (ICD10-I70.0). Electronically  Signed   By: Jeannine Boga M.D.   On: 08/08/2019 22:29      IMPRESSION AND PLAN:   1.  GI bleeding with subsequent severe acute blood loss anemia.  The patient will be admitted to stepdown unit.  She was typed and crossmatch and will be transfused 4 units of packed red blood cells.  Will follow posttransfusion H&H.  She will be continued on IV Protonix drip after a total bolus of 80 mg IV.  Will continue Carafate.  A GI consultation will be obtained by Dr. [name] who was notified about the patient.  We will follow serial hemoglobin and hematocrits.  2.  Coagulopathy secondary to Coumadin.  The patient was given IV vitamin K and her INR will be followed.  Coumadin was stopped.  3.  Acute kidney injury on top of stage III-IV chronic kidney disease.  The patient will be hydrated with IV normal saline initially and will follow BMP.  This could be partly related to her GI bleeding.  4.  Metabolic encephalopathy.  This like secondary to #1 and #3.  Management as above.  We will follow neuro checks every 4 hours for 24 hours.  5.  Hypertension.  Continue Coreg and hydralazine.  Zestril will be held off given acute kidney injury  6.  Dyslipidemia.  Statin therapy will be resumed.  7.  Type 2 diabetes mellitus.  The patient will be placed on supplemental coverage with NovoLog and basal coverage with be continued have home dose when the patient is n.p.o.  8.  DVT prophylaxis.  SCDs.  Medical prophylaxis is currently contraindicated due to GI bleeding..   All the records are reviewed and case discussed with ED provider. The plan of care was discussed in details with the patient (and family). I answered all questions. The patient agreed to proceed with the above mentioned plan. Further management will depend upon hospital course.   CODE STATUS: Full code  TOTAL TIME TAKING CARE OF THIS PATIENT: 55 minutes.    Christel Mormon M.D on 08/09/2019 at 1:25 AM  [facility]   From 7 PM-7  AM, contact night-coverage [email or web address]  CC: Primary care physician; [name], MD   Note: This dictation was prepared with Dragon dictation along with smaller phrase technology. Any transcriptional errors that result from this process are unintentional.

## 2019-08-10 ENCOUNTER — Encounter: Payer: Self-pay | Admitting: Anesthesiology

## 2019-08-10 ENCOUNTER — Encounter
Admission: EM | Disposition: A | Discharge status: Other Acute Inpatient Hospital | Payer: Self-pay | Source: Home / Self Care | Attending: Internal Medicine

## 2019-08-10 ENCOUNTER — Inpatient Hospital Stay (HOSPITAL_COMMUNITY)
Admission: AD | Admit: 2019-08-10 | Discharge: 2019-08-17 | DRG: 177 | Disposition: A | Discharge status: Skilled Nursing Facility | Payer: Medicare Other | Source: Other Acute Inpatient Hospital | Attending: Internal Medicine | Admitting: Internal Medicine

## 2019-08-10 DIAGNOSIS — I130 Hypertensive heart and chronic kidney disease with heart failure and stage 1 through stage 4 chronic kidney disease, or unspecified chronic kidney disease: Secondary | ICD-10-CM | POA: Diagnosis present

## 2019-08-10 DIAGNOSIS — N179 Acute kidney failure, unspecified: Secondary | ICD-10-CM | POA: Diagnosis not present

## 2019-08-10 DIAGNOSIS — Z79899 Other long term (current) drug therapy: Secondary | ICD-10-CM

## 2019-08-10 DIAGNOSIS — Z6841 Body Mass Index (BMI) 40.0 and over, adult: Secondary | ICD-10-CM | POA: Diagnosis not present

## 2019-08-10 DIAGNOSIS — E1151 Type 2 diabetes mellitus with diabetic peripheral angiopathy without gangrene: Secondary | ICD-10-CM | POA: Diagnosis present

## 2019-08-10 DIAGNOSIS — D689 Coagulation defect, unspecified: Secondary | ICD-10-CM | POA: Diagnosis not present

## 2019-08-10 DIAGNOSIS — U071 COVID-19: Secondary | ICD-10-CM | POA: Diagnosis not present

## 2019-08-10 DIAGNOSIS — I2510 Atherosclerotic heart disease of native coronary artery without angina pectoris: Secondary | ICD-10-CM | POA: Diagnosis not present

## 2019-08-10 DIAGNOSIS — G4733 Obstructive sleep apnea (adult) (pediatric): Secondary | ICD-10-CM | POA: Diagnosis not present

## 2019-08-10 DIAGNOSIS — E785 Hyperlipidemia, unspecified: Secondary | ICD-10-CM | POA: Diagnosis not present

## 2019-08-10 DIAGNOSIS — Z743 Need for continuous supervision: Secondary | ICD-10-CM | POA: Diagnosis not present

## 2019-08-10 DIAGNOSIS — Z8673 Personal history of transient ischemic attack (TIA), and cerebral infarction without residual deficits: Secondary | ICD-10-CM

## 2019-08-10 DIAGNOSIS — K922 Gastrointestinal hemorrhage, unspecified: Secondary | ICD-10-CM | POA: Diagnosis present

## 2019-08-10 DIAGNOSIS — Z794 Long term (current) use of insulin: Secondary | ICD-10-CM

## 2019-08-10 DIAGNOSIS — Z7901 Long term (current) use of anticoagulants: Secondary | ICD-10-CM

## 2019-08-10 DIAGNOSIS — E1165 Type 2 diabetes mellitus with hyperglycemia: Secondary | ICD-10-CM | POA: Diagnosis present

## 2019-08-10 DIAGNOSIS — N184 Chronic kidney disease, stage 4 (severe): Secondary | ICD-10-CM | POA: Diagnosis present

## 2019-08-10 DIAGNOSIS — G9341 Metabolic encephalopathy: Secondary | ICD-10-CM | POA: Diagnosis not present

## 2019-08-10 DIAGNOSIS — I482 Chronic atrial fibrillation, unspecified: Secondary | ICD-10-CM | POA: Diagnosis present

## 2019-08-10 DIAGNOSIS — K219 Gastro-esophageal reflux disease without esophagitis: Secondary | ICD-10-CM | POA: Diagnosis not present

## 2019-08-10 DIAGNOSIS — M2550 Pain in unspecified joint: Secondary | ICD-10-CM | POA: Diagnosis not present

## 2019-08-10 DIAGNOSIS — R32 Unspecified urinary incontinence: Secondary | ICD-10-CM | POA: Diagnosis present

## 2019-08-10 DIAGNOSIS — D500 Iron deficiency anemia secondary to blood loss (chronic): Secondary | ICD-10-CM | POA: Diagnosis not present

## 2019-08-10 DIAGNOSIS — Z791 Long term (current) use of non-steroidal anti-inflammatories (NSAID): Secondary | ICD-10-CM | POA: Diagnosis not present

## 2019-08-10 DIAGNOSIS — L8990 Pressure ulcer of unspecified site, unspecified stage: Secondary | ICD-10-CM | POA: Insufficient documentation

## 2019-08-10 DIAGNOSIS — I10 Essential (primary) hypertension: Secondary | ICD-10-CM | POA: Diagnosis not present

## 2019-08-10 DIAGNOSIS — E1122 Type 2 diabetes mellitus with diabetic chronic kidney disease: Secondary | ICD-10-CM | POA: Diagnosis present

## 2019-08-10 DIAGNOSIS — L89152 Pressure ulcer of sacral region, stage 2: Secondary | ICD-10-CM | POA: Diagnosis present

## 2019-08-10 DIAGNOSIS — I050 Rheumatic mitral stenosis: Secondary | ICD-10-CM | POA: Diagnosis present

## 2019-08-10 DIAGNOSIS — I2721 Secondary pulmonary arterial hypertension: Secondary | ICD-10-CM | POA: Diagnosis present

## 2019-08-10 DIAGNOSIS — Z66 Do not resuscitate: Secondary | ICD-10-CM | POA: Diagnosis not present

## 2019-08-10 DIAGNOSIS — I4891 Unspecified atrial fibrillation: Secondary | ICD-10-CM | POA: Diagnosis not present

## 2019-08-10 DIAGNOSIS — J1289 Other viral pneumonia: Secondary | ICD-10-CM | POA: Diagnosis not present

## 2019-08-10 DIAGNOSIS — J9621 Acute and chronic respiratory failure with hypoxia: Secondary | ICD-10-CM | POA: Diagnosis present

## 2019-08-10 DIAGNOSIS — I5033 Acute on chronic diastolic (congestive) heart failure: Secondary | ICD-10-CM | POA: Diagnosis not present

## 2019-08-10 DIAGNOSIS — Z823 Family history of stroke: Secondary | ICD-10-CM

## 2019-08-10 DIAGNOSIS — I480 Paroxysmal atrial fibrillation: Secondary | ICD-10-CM | POA: Diagnosis present

## 2019-08-10 DIAGNOSIS — I959 Hypotension, unspecified: Secondary | ICD-10-CM | POA: Diagnosis not present

## 2019-08-10 DIAGNOSIS — R791 Abnormal coagulation profile: Secondary | ICD-10-CM | POA: Diagnosis present

## 2019-08-10 DIAGNOSIS — Z7401 Bed confinement status: Secondary | ICD-10-CM | POA: Diagnosis not present

## 2019-08-10 LAB — COMPREHENSIVE METABOLIC PANEL
ALT: 22 U/L (ref 0–44)
AST: 30 U/L (ref 15–41)
Albumin: 2.3 g/dL — ABNORMAL LOW (ref 3.5–5.0)
Alkaline Phosphatase: 76 U/L (ref 38–126)
Anion gap: 13 (ref 5–15)
BUN: 74 mg/dL — ABNORMAL HIGH (ref 8–23)
CO2: 27 mmol/L (ref 22–32)
Calcium: 8.1 mg/dL — ABNORMAL LOW (ref 8.9–10.3)
Chloride: 99 mmol/L (ref 98–111)
Creatinine, Ser: 2.99 mg/dL — ABNORMAL HIGH (ref 0.44–1.00)
GFR calc Af Amer: 17 mL/min — ABNORMAL LOW (ref >60)
GFR calc non Af Amer: 15 mL/min — ABNORMAL LOW (ref >60)
Glucose, Bld: 98 mg/dL (ref 70–99)
Potassium: 4.1 mmol/L (ref 3.5–5.1)
Sodium: 139 mmol/L (ref 135–145)
Total Bilirubin: 1.5 mg/dL — ABNORMAL HIGH (ref 0.3–1.2)
Total Protein: 6.2 g/dL — ABNORMAL LOW (ref 6.5–8.1)

## 2019-08-10 LAB — GLUCOSE, CAPILLARY
Glucose-Capillary: 104 mg/dL — ABNORMAL HIGH (ref 70–99)
Glucose-Capillary: 108 mg/dL — ABNORMAL HIGH (ref 70–99)

## 2019-08-10 LAB — CBC WITH DIFFERENTIAL/PLATELET
Abs Immature Granulocytes: 0.13 10*3/uL — ABNORMAL HIGH (ref 0.00–0.07)
Basophils Absolute: 0 10*3/uL (ref 0.0–0.1)
Basophils Relative: 0 %
Eosinophils Absolute: 0.1 10*3/uL (ref 0.0–0.5)
Eosinophils Relative: 0 %
HCT: 23.4 % — ABNORMAL LOW (ref 36.0–46.0)
Hemoglobin: 7.3 g/dL — ABNORMAL LOW (ref 12.0–15.0)
Immature Granulocytes: 1 %
Lymphocytes Relative: 6 %
Lymphs Abs: 0.7 10*3/uL (ref 0.7–4.0)
MCH: 28.7 pg (ref 26.0–34.0)
MCHC: 31.2 g/dL (ref 30.0–36.0)
MCV: 92.1 fL (ref 80.0–100.0)
Monocytes Absolute: 0.6 10*3/uL (ref 0.1–1.0)
Monocytes Relative: 5 %
Neutro Abs: 10.2 10*3/uL — ABNORMAL HIGH (ref 1.7–7.7)
Neutrophils Relative %: 88 %
Platelets: 294 10*3/uL (ref 150–400)
RBC: 2.54 MIL/uL — ABNORMAL LOW (ref 3.87–5.11)
RDW: 14.8 % (ref 11.5–15.5)
WBC: 11.7 10*3/uL — ABNORMAL HIGH (ref 4.0–10.5)
nRBC: 0.3 % — ABNORMAL HIGH (ref 0.0–0.2)

## 2019-08-10 LAB — PROTIME-INR
INR: 1.8 — ABNORMAL HIGH (ref 0.8–1.2)
INR: 1.3 — ABNORMAL HIGH (ref 0.8–1.2)
Prothrombin Time: 20.2 seconds — ABNORMAL HIGH (ref 11.4–15.2)
Prothrombin Time: 16.1 seconds — ABNORMAL HIGH (ref 11.4–15.2)

## 2019-08-10 LAB — C-REACTIVE PROTEIN: CRP: 29.4 mg/dL — ABNORMAL HIGH (ref <1.0)

## 2019-08-10 LAB — PROCALCITONIN: Procalcitonin: 0.61 ng/mL

## 2019-08-10 LAB — D-DIMER, QUANTITATIVE: D-Dimer, Quant: 1.71 ug/mL-FEU — ABNORMAL HIGH (ref 0.00–0.50)

## 2019-08-10 LAB — BRAIN NATRIURETIC PEPTIDE: B Natriuretic Peptide: 668.9 pg/mL — ABNORMAL HIGH (ref 0.0–100.0)

## 2019-08-10 SURGERY — EGD (ESOPHAGOGASTRODUODENOSCOPY)
Anesthesia: General

## 2019-08-10 MED ORDER — FLUOXETINE HCL 20 MG PO CAPS
20.0000 mg | ORAL_CAPSULE | Freq: Every day | ORAL | Status: DC
  Administered 2019-08-10 – 2019-08-17 (×8): 20 mg via ORAL
  Filled 2019-08-10 (×9): qty 1

## 2019-08-10 MED ORDER — SACCHAROMYCES BOULARDII 250 MG PO CAPS: 250.0000 mg | ORAL_CAPSULE | Freq: Two times a day (BID) | ORAL | Status: DC

## 2019-08-10 MED ORDER — FUROSEMIDE 10 MG/ML IJ SOLN
40.0000 mg | Freq: Once | INTRAMUSCULAR | Status: AC
  Administered 2019-08-10: 40 mg via INTRAVENOUS
  Filled 2019-08-10: qty 4

## 2019-08-10 MED ORDER — HYDRALAZINE HCL 50 MG PO TABS
50.0000 mg | ORAL_TABLET | Freq: Four times a day (QID) | ORAL | Status: DC
  Administered 2019-08-10 – 2019-08-15 (×19): 50 mg via ORAL
  Filled 2019-08-10 (×19): qty 1

## 2019-08-10 MED ORDER — CARVEDILOL 12.5 MG PO TABS
25.0000 mg | ORAL_TABLET | Freq: Two times a day (BID) | ORAL | Status: DC
  Administered 2019-08-10 – 2019-08-14 (×8): 25 mg via ORAL
  Filled 2019-08-10 (×8): qty 2

## 2019-08-10 MED ORDER — FUROSEMIDE 20 MG PO TABS
20.0000 mg | ORAL_TABLET | Freq: Two times a day (BID) | ORAL | Status: DC
  Administered 2019-08-10: 20 mg via ORAL
  Filled 2019-08-10: qty 1

## 2019-08-10 MED ORDER — ACETAMINOPHEN 325 MG PO TABS
650.0000 mg | ORAL_TABLET | Freq: Three times a day (TID) | ORAL | Status: DC | PRN
  Administered 2019-08-13: 650 mg via ORAL
  Filled 2019-08-10: qty 2

## 2019-08-10 MED ORDER — PANTOPRAZOLE SODIUM 40 MG PO TBEC: 40.0000 mg | DELAYED_RELEASE_TABLET | Freq: Two times a day (BID) | ORAL | Status: DC

## 2019-08-10 MED ORDER — INSULIN GLARGINE 100 UNIT/ML ~~LOC~~ SOLN
15.0000 [IU] | Freq: Every evening | SUBCUTANEOUS | Status: DC
  Administered 2019-08-11 – 2019-08-16 (×6): 15 [IU] via SUBCUTANEOUS
  Filled 2019-08-10 (×7): qty 0.15

## 2019-08-10 MED ORDER — ROPINIROLE HCL 0.25 MG PO TABS
0.2500 mg | ORAL_TABLET | Freq: Every day | ORAL | Status: DC
  Administered 2019-08-10 – 2019-08-16 (×7): 0.25 mg via ORAL
  Filled 2019-08-10 (×8): qty 1

## 2019-08-10 MED ORDER — SUCRALFATE 1 G PO TABS
1.0000 g | ORAL_TABLET | Freq: Three times a day (TID) | ORAL | Status: DC
  Administered 2019-08-10 – 2019-08-17 (×26): 1 g via ORAL
  Filled 2019-08-10 (×33): qty 1

## 2019-08-10 MED ORDER — VITAMIN K1 10 MG/ML IJ SOLN
5.0000 mg | Freq: Once | INTRAVENOUS | Status: AC
  Administered 2019-08-10: 5 mg via INTRAVENOUS
  Filled 2019-08-10: qty 0.5

## 2019-08-10 MED ORDER — CYANOCOBALAMIN 250 MCG PO TABS
250.0000 ug | ORAL_TABLET | Freq: Every day | ORAL | Status: DC
  Administered 2019-08-10 – 2019-08-17 (×8): 250 ug via ORAL
  Filled 2019-08-10 (×9): qty 1

## 2019-08-10 MED ORDER — ADULT MULTIVITAMIN W/MINERALS CH
1.0000 | ORAL_TABLET | Freq: Every day | ORAL | Status: DC
  Administered 2019-08-10 – 2019-08-17 (×8): 1 via ORAL
  Filled 2019-08-10 (×8): qty 1

## 2019-08-10 MED ORDER — ATORVASTATIN CALCIUM 40 MG PO TABS
40.0000 mg | ORAL_TABLET | Freq: Every day | ORAL | Status: DC
  Administered 2019-08-10 – 2019-08-16 (×7): 40 mg via ORAL
  Filled 2019-08-10 (×7): qty 1

## 2019-08-10 MED ORDER — VITAMIN K1 10 MG/ML IJ SOLN
5.0000 mg | Freq: Once | INTRAVENOUS | Status: DC
  Filled 2019-08-10: qty 0.5

## 2019-08-10 MED ORDER — PANTOPRAZOLE SODIUM 40 MG IV SOLR
40.0000 mg | Freq: Two times a day (BID) | INTRAVENOUS | Status: DC
  Administered 2019-08-10 – 2019-08-13 (×7): 40 mg via INTRAVENOUS
  Filled 2019-08-10 (×7): qty 40

## 2019-08-10 MED ORDER — RISAQUAD PO CAPS
1.0000 | ORAL_CAPSULE | Freq: Two times a day (BID) | ORAL | Status: DC
  Administered 2019-08-10 – 2019-08-17 (×14): 1 via ORAL
  Filled 2019-08-10 (×14): qty 1

## 2019-08-10 MED ORDER — DOCUSATE SODIUM 100 MG PO CAPS
100.0000 mg | ORAL_CAPSULE | Freq: Two times a day (BID) | ORAL | Status: DC
  Administered 2019-08-10 – 2019-08-14 (×7): 100 mg via ORAL
  Filled 2019-08-10 (×7): qty 1

## 2019-08-10 NOTE — ED Notes (Signed)
Pt alert.  nsr on monitor.  Iv 's in place.  Pt on oxygen.   No acute distress.

## 2019-08-10 NOTE — Progress Notes (Signed)
Patients daughter called and updated after patient transferred here.

## 2019-08-10 NOTE — Discharge Summary (Addendum)
Physician Discharge Summary  Megan Obrien OVF:[id number] DOB: [birth year] DOA: 08/10/2019  PCP: [name], MD  Admit date: 08/10/2019 Discharge date: 08/10/2019  Admitted From: SN[facility] Disposition:  [place]   Discharge Condition: Stable  CODE STATUS: FULL Diet recommendation: Diabetic  Brief/Interim Summary: Megan Obrien is a 73 y.o. 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  She is ambulatory and interactive and oriented to place at baseline.  In last 24 hours, she had gotten more confused, too weak to stand.  No reports of melena at facility.  In the ER, INR >10, Hgb 3.9 (down from baseline 7s).  Stool dark brown and FOBT+, but no stool, melena, or hematemesis noted.  Started on PPI.        PRINCIPAL HOSPITAL DIAGNOSIS: Chronic blood loss anemia    Discharge Diagnoses:   Blood loss anemia Possible chronic.   Hgb baseline is 7-8 g/dL.  Here it was 3.9 g/dL, rose overnight to 5s with 2 units.  No further melena.  No tachycardia or hypotension.  Suspect no active bleed.   -Continue transfusing, goal 7 g/dL   GI bleed Patient admitted and started on PPI.  GI were consulted and planned for endoscopy.  Patient subsequently tested positive for COVID and EGD canceled.  I discussed with Dr. [name], [facility].  He suspects this is a chronic GI bleed. He does not suspect intraabdominal bleeding or acute GI bleed.  He recommends no EGD during this hospitalization, and outpatient follow up with GI.  Can resume solid diet, transition to oral PPI.    COVID-19 Pneumonia on CXR.    On 2L.    Of note, her note from 11/5 in the ER suggests that she tested positive 2 weeks ago. -Transfer to [facility]         Discharge Instructions   Allergies as of 08/10/2019   No Known Allergies            No Known Allergies  Consultations:  GI, Dr. [name]   Procedures/Studies: Dg Chest Portable 1 View  Result Date: 08/08/2019 CLINICAL DATA:  Initial evaluation for acute altered mental status. EXAM: PORTABLE CHEST 1 VIEW COMPARISON:  Prior radiograph from 01/01/2019. FINDINGS: Cardiomegaly, grossly stable from previous. Mediastinal silhouette within normal limits. Aortic atherosclerosis. Lungs mildly hypoinflated. Diffuse pulmonary vascular congestion with interstitial prominence compatible with mild pulmonary interstitial edema. Superimposed dense opacity at the retrocardiac left lower lobe could reflect atelectasis or infiltrate or possibly effusion. Suspected small left pleural effusion. No pneumothorax. No acute osseous finding. Prominent degenerative changes noted about the shoulders bilaterally. IMPRESSION: 1. Cardiomegaly with mild diffuse pulmonary interstitial edema. Probable small left pleural effusion. 2. Superimposed dense opacity at the retrocardiac left lower lobe, which may reflect atelectasis, infiltrate, or effusion. 3.  Aortic Atherosclerosis (ICD10-I70.0). Electronically Signed   By: Jeannine Boga M.D.   On: 08/08/2019 22:29   Dg Hip Unilat With Pelvis 2-3 Views Left  Result Date: 08/06/2019 CLINICAL DATA:  Left hip and groin pain EXAM: DG HIP (WITH OR WITHOUT PELVIS) 2-3V LEFT COMPARISON:  None. FINDINGS: Left hip in internal rotation limiting radiographic evaluation. No displaced fracture. No pelvic fracture or mass lesion. Calcification overlying the sacrum likely lymph node or calcified uterine fibroid. Mild degenerative change right hip joint. Soft tissue calcification around the greater trochanter on the right.  Disc degeneration with disc space narrowing and spurring on the left L4-5. IMPRESSION: No displaced fracture. Internal rotation left hip limits radiographic evaluation. If the patient is not able to bear weight, further imaging evaluation  recommended. Electronically Signed   By: Franchot Gallo M.D.   On: 08/06/2019 07:34       Subjective:   Discharge Exam:   The results of significant diagnostics from this hospitalization (including imaging, microbiology, ancillary and laboratory) are listed below for reference.     Microbiology: Recent Results (from the past 240 hour(s))  Culture, blood (routine x 2)     Status: None (Preliminary result)   Collection Time: 08/08/19 10:06 PM   Specimen: BLOOD  Result Value Ref Range Status   Specimen Description BLOOD LEFT ANTECUBITAL  Final   Special Requests   Final    BOTTLES DRAWN AEROBIC AND ANAEROBIC Blood Culture adequate volume   Culture   Final    NO GROWTH 2 DAYS Performed at [facility], [address], [place], [place] [address]    Report Status PENDING  Incomplete  Culture, blood (routine x 2)     Status: None (Preliminary result)   Collection Time: 08/08/19 10:07 PM   Specimen: BLOOD RIGHT HAND  Result Value Ref Range Status   Specimen Description BLOOD RIGHT HAND  Final   Special Requests   Final    BOTTLES DRAWN AEROBIC AND ANAEROBIC Blood Culture adequate volume   Culture   Final    NO GROWTH 2 DAYS Performed at [facility], [address], [place], [place] [address]    Report Status PENDING  Incomplete  SARS CORONAVIRUS 2 (TAT 6-24 HRS) Nasopharyngeal Nasopharyngeal Swab     Status: Abnormal   Collection Time: 08/09/19  1:20 AM   Specimen: Nasopharyngeal Swab  Result Value Ref Range Status   SARS Coronavirus 2 POSITIVE (A) NEGATIVE Final    Comment: RESULT CALLED TO, READ BACK BY AND VERIFIED WITH: ARIEL SMITH RN.@1850  ON 11.8.2020 BY TCALDWELL MT. (NOTE) SARS-CoV-2 target nucleic acids are DETECTED. The SARS-CoV-2 RNA is generally detectable in upper and lower respiratory specimens during the acute phase of infection. Positive results are indicative of active infection with SARS-CoV-2. Clinical  correlation with patient  history and other diagnostic information is necessary to determine patient infection status. Positive results do  not rule out bacterial infection or co-infection with other viruses. The expected result is Negative. Fact Sheet for Patients: SugarRoll.be Fact Sheet for Healthcare Providers: https://www.woods-mathews.com/ This test is not yet approved or cleared by the [place] FDA and  has been authorized for detection and/or diagnosis of SARS-CoV-2 by FDA under an Emergency Use Authorization (EUA). This EUA will remain  in effect (meaning this test can  be used) for the duration of the COVID-19 declaration under Section 564(b)(1) of the Act, 21 U.S.C. section 360bbb-3(b)(1), unless the authorization is terminated or revoked sooner. Performed at [facility], [place] [address], [place],  [address]      Labs: BNP (last 3 results) Recent Labs    11/24/18 1354  BNP 242.3*   Basic Metabolic Panel: Recent Labs  Lab 08/08/19 2206 08/09/19 0751  NA 139 140  K 3.6 3.7  CL 97* 98  CO2 31 32  GLUCOSE 78 73  BUN 72* 74*  CREATININE 2.91* 2.89*  CALCIUM 8.0* 8.0*   Liver Function Tests: Recent Labs  Lab 08/08/19 2206  AST 42*  ALT 21  ALKPHOS 62  BILITOT 1.1  PROT 5.7*  ALBUMIN 2.1*   No results for input(s): LIPASE, AMYLASE in the last 168 hours. No results for input(s): AMMONIA in the last 168 hours. CBC: Recent Labs  Lab 08/08/19 2206 08/09/19 0751  WBC 15.8* 15.4*  NEUTROABS 13.8*  --   HGB 3.9* 5.6*  HCT 13.1* 17.5*  MCV 90.3 88.4  PLT 367 332   Cardiac Enzymes: No results for input(s): CKTOTAL, CKMB, CKMBINDEX, TROPONINI in the last 168 hours. BNP: Invalid input(s): POCBNP CBG: No results for input(s): GLUCAP in the last 168 hours. D-Dimer No results for input(s): DDIMER in the last 72 hours. Hgb A1c No results for input(s): HGBA1C in the last 72 hours. Lipid Profile No results for input(s):  CHOL, HDL, LDLCALC, TRIG, CHOLHDL, LDLDIRECT in the last 72 hours. Thyroid function studies Recent Labs    08/09/19 0751  TSH 3.914   Anemia work up No results for input(s): VITAMINB12, FOLATE, FERRITIN, TIBC, IRON, RETICCTPCT in the last 72 hours. Urinalysis    Component Value Date/Time   COLORURINE YELLOW (A) 08/08/2019 2206   APPEARANCEUR TURBID (A) 08/08/2019 2206   LABSPEC 1.015 08/08/2019 2206   PHURINE 7.0 08/08/2019 2206   GLUCOSEU NEGATIVE 08/08/2019 2206   HGBUR NEGATIVE 08/08/2019 2206   BILIRUBINUR NEGATIVE 08/08/2019 2206   BILIRUBINUR negative 07/24/2016 1303   KETONESUR NEGATIVE 08/08/2019 2206   PROTEINUR NEGATIVE 08/08/2019 2206   UROBILINOGEN negative 07/24/2016 1303   NITRITE NEGATIVE 08/08/2019 2206   LEUKOCYTESUR NEGATIVE 08/08/2019 2206   Sepsis Labs Invalid input(s): PROCALCITONIN,  WBC,  LACTICIDVEN Microbiology Recent Results (from the past 240 hour(s))  Culture, blood (routine x 2)     Status: None (Preliminary result)   Collection Time: 08/08/19 10:06 PM   Specimen: BLOOD  Result Value Ref Range Status   Specimen Description BLOOD LEFT ANTECUBITAL  Final   Special Requests   Final    BOTTLES DRAWN AEROBIC AND ANAEROBIC Blood Culture adequate volume   Culture   Final    NO GROWTH 2 DAYS Performed at [facility], [address], [place], [place] [address]    Report Status PENDING  Incomplete  Culture, blood (routine x 2)     Status: None (Preliminary result)   Collection Time: 08/08/19 10:07 PM   Specimen: BLOOD RIGHT HAND  Result Value Ref Range Status   Specimen Description BLOOD RIGHT HAND  Final   Special Requests   Final    BOTTLES DRAWN AEROBIC AND ANAEROBIC Blood Culture adequate volume   Culture   Final    NO GROWTH 2 DAYS Performed at [facility], [address], [place], [place] [address]    Report Status PENDING  Incomplete  SARS CORONAVIRUS 2 (TAT 6-24 HRS) Nasopharyngeal Nasopharyngeal Swab     Status:  Abnormal   Collection Time: 08/09/19  1:20 AM   Specimen: Nasopharyngeal Swab  Result Value Ref Range Status   SARS Coronavirus 2 POSITIVE (A) NEGATIVE Final    Comment: RESULT CALLED TO, READ BACK BY AND VERIFIED WITH: ARIEL SMITH RN.@1850  ON 11.8.2020 BY TCALDWELL MT. (NOTE) SARS-CoV-2 target nucleic acids are DETECTED. The SARS-CoV-2 RNA is generally detectable in upper and lower respiratory specimens during the acute phase of infection. Positive results are indicative of active infection with SARS-CoV-2. Clinical  correlation with patient history and other diagnostic information is necessary to determine patient infection status. Positive results do  not rule out bacterial infection or co-infection with other viruses. The expected result is Negative. Fact Sheet for  Patients: SugarRoll.be Fact Sheet for Healthcare Providers: https://www.woods-mathews.com/ This test is not yet approved or cleared by the [place] FDA and  has been authorized for detection and/or diagnosis of SARS-CoV-2 by FDA under an Emergency Use Authorization (EUA). This EUA will remain  in effect (meaning this test can  be used) for the duration of the COVID-19 declaration under Section 564(b)(1) of the Act, 21 U.S.C. section 360bbb-3(b)(1), unless the authorization is terminated or revoked sooner. Performed at [facility], [place] [address] [place], [place] [address]      Time coordinating discharge:       SIGNED:   Edwin Dada, MD  [facility] 08/10/2019, 12:16 PM

## 2019-08-10 NOTE — ED Notes (Signed)
Patient awakened for assessment. Denies discomfort. Is not aware where she is but is agreeing when told is in [place]. Questioned if she is her own spokesperson or has guardian and states thinks daughter is guardian. Will follow up regarding this. Monitor SR. Patient in reclining position in bed.  Returns sleep after questioning. purewick in place with bed noted dry.

## 2019-08-10 NOTE — Progress Notes (Addendum)
Patient arrived from [facility] via EMS on stretcher.  Patient morbidly obese, pale, and is very drowsy, required significant stimuli, vocal and touch, to arouse.  Oriented to self only.  EMS reports Hgb 5.6 and that patient has received 3 units PRBCs at [facility], with the fourth unit PRBC started prior to EMS transport and remains infusing now upon arrival to [facility] at IV rate 165ml/hr  TELE applied, SB with 1st degree AV Block noted, HR 59bpm.  BP 161/59   2L o2 via [place], Spo2 100%.  RR 16 VSS.  Bed alarm on, call bell in reach.  Charge RN and MD notified of patient's arrival.     08/10/19 1220  Vitals  Temp 97.7 F (36.5 C)  Temp Source Axillary  BP (!) 161/59  MAP (mmHg) 86  BP Location Right Arm  BP Method Automatic  Patient Position (if appropriate) Lying  Pulse Rate (!) 59  Pulse Rate Source Monitor  ECG Heart Rate (!) 59  Cardiac Rhythm SB;Heart block  Heart Block Type 1st degree AVB  Resp 16  Level of Consciousness  Level of Consciousness Responds to Voice  Oxygen Therapy  SpO2 100 %  O2 Device Nasal Cannula  O2 Flow Rate (L/min) 2 L/min  Patient Activity (if Appropriate) In bed  Pulse Oximetry Type Continuous  Oximetry Probe Site Changed No  Pain Assessment  Pain Scale Faces  Pain Score 0  MEWS Score  MEWS RR 0  MEWS Pulse 0  MEWS Systolic 0  MEWS LOC 1  MEWS Temp 0  MEWS Score 1  MEWS Score Color [name]

## 2019-08-10 NOTE — ED Notes (Signed)
Report to [facility] and [facility], PRBC #4 started. Patient with no resp distress.

## 2019-08-10 NOTE — ED Notes (Signed)
ED tech Beth in to check on pt; pt with no complaints; says she's comfortable in her current position; would like something to drink; will check orders

## 2019-08-10 NOTE — ED Notes (Signed)
Pt sleeping  Sinus brady on monitor.

## 2019-08-10 NOTE — ED Notes (Signed)
Daughter [name] notified of transfer to [facility].

## 2019-08-10 NOTE — ED Notes (Signed)
Report off to laurie rn

## 2019-08-10 NOTE — Discharge Summary (Deleted)
  The note originally documented on this encounter has been moved the the encounter in which it belongs.  

## 2019-08-10 NOTE — H&P (Signed)
TRH H&P   Patient Demographics:    Megan Obrien, is a 73 y.o. female  MRN: [id number]   DOB - 1946-07-13  Admit Date - 08/10/2019  Outpatient Primary MD for the patient is [name], MD  Referring MD/NP/PA: Dr Loleta Books  Patient coming from: [facility]  No chief complaint on file.     HPI:    Megan Obrien  is a 73 y.o. female, with obesity, CHF, hypertension, dyslipidemia, GERD, stage III chronic kidney disease, CVA on warfarin, who is long-term resident at [facility], she was sent to ED for generalized weakness, altered mental status, being pale, reports he is usually ambulatory, awake alert x3, noted to be less interactive, patient herself denies any complaints, upon presentation to ED she was noted to have severe anemia with hemoglobin of 3.9, baseline 7.7 April of this year, mild leukocytosis at 15.8K, she is on warfarin for A. fib, INR noted to be>10, getting in noted to be 2.9, baseline around 2, patient had normal colored stool, but Hemoccult was guaiac positive, patient was admitted to [facility], she was transfused 2 units of PRBC with good response hemoglobin was 5.6, seen by GI, with plan to scope initially, then patient COVID-19 came back positive, per GI anoscopy can be postponed, and she can be started on soft diet, patient received another 2 units PRBC, repeat labs are pending, given her COVID-19 status she was sent to [facility], patient denies any dyspnea, fever or chills.    Review of systems:    Patient remains confused, review of system is not very reliable No Fever-chills, No Headache, No changes with Vision or hearing, No problems swallowing food or Liquids, No Chest pain, Cough or Shortness of Breath, No Abdominal pain, No Nausea or Vommitting, Bowel movements are regular, No Blood in stool or Urine, No dysuria, No new skin rashes or  bruises, No new joints pains-aches,  Patient reports generalized weakness and fatigue No recent weight gain or loss, No polyuria, polydypsia or polyphagia, No significant Mental Stressors.  A full 10 point Review of Systems was done, except as stated above, all other Review of Systems were negative.   With Past History of the following :    Past Medical History:  Diagnosis Date  . (HFpEF) heart failure with preserved ejection fraction ([place])    a. 03/2017 Echo: EF 55-60%, no rwma, Gr1 DD, mild to mod MS, mod to sev dil LA.  [name] Arthritis   . Carotid arterial disease ([place])    a. 11/2017 CTA Head/Neck: RICA 45, LICA 60.  [name] Cerebrovascular disease    a. 11/2017 MRA: mod to sev stenosis of bilat PCA's P2 segments; b. 11/2017 CTA Head/Neck: RICA 45, LICA 60, sev bilat distal MCA branch stenoses w/ sev bilat P2 stenoses.  . CKD (chronic kidney disease), stage III   . Depression   . GERD (gastroesophageal reflux disease)   .  Hyperlipidemia   . Hypertension   . Left Atrial Appendage Thrombus    a. 11/2017 TEE (performed in setting of stroke/aphasia): nl EF, mod MS, dil LA w/ heavy smoke and small LAA thrombus-->Warfarin initiated.  . Mitral stenosis    a. Mild to moderate by echo 03/2017; b. 03/2017 R/LHC: PCWP 64mmHg, LVEDP 63mmHg. Mean grad of 19mmHg. Valve area of 2.02cm^2; c. 11/2017 TEE: Mod MS.  . Morbid obesity ([place])   . Murmur   . Non-obstructive CAD (coronary artery disease)    a. 03/2017 Cath: mild, non-obstructive CAD.  [name] OSA (obstructive sleep apnea)    a. Previously wore CPAP for several years but stopped on her own and now just uses O2 via [place] @ night.  [name] PAH (pulmonary artery hypertension) ([place])    a. 03/2017 RHC: PA 22mmHg.  . Seasonal allergies   . Stroke [facility])    a. 11/2017 - presented w/ aphasia->MRI acute to early subacute ischemia w/in multiple vascular territories, largest in R frontal & L parietal lobes. Smaller foci of ischemia w/in R hippocampus, R parietal lobe, and L  peripheral postcentral gyrus.  . Type 2 diabetes mellitus ([place])   . Urinary incontinence       Past Surgical History:  Procedure Laterality Date  . BACK SURGERY    . CARDIAC CATHETERIZATION    . KNEE SURGERY Left   . RIGHT/LEFT HEART CATH AND CORONARY ANGIOGRAPHY Bilateral 04/25/2017   Procedure: Right/Left Heart Cath and Coronary Angiography;  Surgeon: Wellington Hampshire, MD;  Location: [facility];  Service: Cardiovascular;  Laterality: Bilateral;  . TEE WITHOUT CARDIOVERSION N/A 12/20/2017   Procedure: TRANSESOPHAGEAL ECHOCARDIOGRAM (TEE);  Surgeon: Wellington Hampshire, MD;  Location: [facility] ORS;  Service: Cardiovascular;  Laterality: N/A;  . [place]      Social History:     Social History   Tobacco Use  . Smoking status: Never Smoker  . Smokeless tobacco: Never Used  Substance Use Topics  . Alcohol use: No    Alcohol/week: 0.0 standard drinks    Comment: rarely       Family History :     Family History  Problem Relation Age of Onset  . Lung cancer Father   . Alzheimer's disease Mother   . Stroke Maternal Grandfather       Home Medications:   Prior to Admission medications   Medication Sig Start Date End Date Taking? Authorizing Provider  acetaminophen (TYLENOL) 650 MG CR tablet Take 650 mg by mouth every 8 (eight) hours as needed for pain.    [provider]  alum & mag hydroxide-simeth (MAALOX/MYLANTA) 200-200-20 MG/5ML suspension Take 10 mLs by mouth every 6 (six) hours as needed for indigestion or heartburn.    [provider]  atorvastatin (LIPITOR) 40 MG tablet Take 1 tablet (40 mg total) by mouth at bedtime. 08/14/18   Pleas Koch, NP  carvedilol (COREG) 25 MG tablet Take 50 mg by mouth 2 (two) times daily with a meal.     [provider]  diclofenac sodium (VOLTAREN) 1 % GEL Apply topically 2 (two) times a day.    [provider]  docusate sodium (COLACE) 100 MG capsule Take 1  capsule (100 mg total) by mouth 2 (two) times daily. 01/05/19   Hillary Bow, MD  FLUoxetine (PROZAC) 40 MG capsule Take 1 capsule by mouth once daily for depression and anxiety. 11/11/18   Pleas Koch, NP  furosemide (LASIX) 20  MG tablet Take 1 tablet (20 mg total) by mouth daily. Patient taking differently: Take 20 mg by mouth 2 (two) times daily.  11/25/18   Dunn, Areta Haber, PA-C  glimepiride (AMARYL) 2 MG tablet Take 2 mg by mouth daily with breakfast.    Warnell Forester, NP  hydrALAZINE (APRESOLINE) 25 MG tablet Take 50 mg by mouth 4 (four) times daily.    [provider]  HYDROcodone-acetaminophen (NORCO/VICODIN) 5-325 MG tablet Take 1 tablet by mouth every 6 (six) hours as needed for moderate pain.    [provider]  Insulin Glargine (LANTUS) 100 UNIT/ML Solostar Pen Inject 15 Units into the skin every evening. Patient taking differently: Inject 17 Units into the skin daily.  04/09/18   Pleas Koch, NP  lisinopril (ZESTRIL) 20 MG tablet Take 20 mg by mouth 2 (two) times a day.    [provider]  loperamide (IMODIUM) 2 MG capsule Take by mouth as needed for diarrhea or loose stools.    [provider]  magnesium oxide (MAG-OX) 400 MG tablet Take 400 mg by mouth daily.    [provider]  Melatonin 5 MG TABS Take 5 mg by mouth at bedtime.    [provider]  Multiple Vitamin (MULTIVITAMIN) tablet Take 1 tablet by mouth daily.    [provider]  omega-3 acid ethyl esters (LOVAZA) 1 g capsule TAKE 1 CAPSULE BY MOUTH ONCE DAILY WITH FOOD FOR CHOLESTEROL 09/01/18   Pleas Koch, NP  omeprazole (PRILOSEC) 40 MG capsule Take 1 capsule (40 mg total) by mouth daily. Patient taking differently: Take 40 mg by mouth 2 (two) times a day.  11/13/18   Pleas Koch, NP  rOPINIRole (REQUIP) 0.25 MG tablet Take 0.25 mg by mouth at bedtime.    [provider]  saccharomyces boulardii (FLORASTOR) 250 MG capsule Take  250 mg by mouth 2 (two) times daily.    [provider]  sucralfate (CARAFATE) 1 g tablet Take 1 g by mouth 4 (four) times daily -  with meals and at bedtime.    [provider]  vitamin B-12 (CYANOCOBALAMIN) 250 MCG tablet Take 250 mcg by mouth daily.    [provider]  warfarin (COUMADIN) 1 MG tablet Take 3.5 mg by mouth as directed. Take 1 tablet on Monday, Weds, Friday    [provider]  warfarin (COUMADIN) 3 MG tablet Take 3 mg by mouth as directed. Take 1 tablet on Sunday, Tuesday, Thursday, Saturday.    [provider]     Allergies:    No Known Allergies   Physical Exam:   Vitals  Blood pressure (!) 161/59, pulse (!) 59, temperature 97.7 F (36.5 C), temperature source Axillary, resp. rate 16, SpO2 100 %.   1. General frail, elderly appearing female, laying in bed in no apparent distress  2.  Patient is sleepy, but wakes up and answer most of the questions appropriately, but easily distracted, she is oriented x2 .  3. No F.N deficits, ALL C.Nerves Intact, overall patient with significant generalized weakness, but nothing focal  4. Ears and Eyes appear Normal, Conjunctivae clear, PERRLA. Moist Oral Mucosa.  5. Supple Neck, No JVD, No cervical lymphadenopathy appriciated, No Carotid Bruits.  6. Symmetrical Chest wall movement, Good air movement bilaterally, CTAB.  7.  Irregular irregular, No Gallops, Rubs or Murmurs, No Parasternal Heave.  8. Positive Bowel Sounds, Abdomen Soft, No tenderness, morbidly obese,No rebound -guarding or rigidity.  9.  No  Cyanosis, Normal Skin Turgor, No Skin Rash or Bruise.  10. Good muscle tone,  joints appear normal , no effusions, Normal ROM.  +2 lower extremity edema  11. No Palpable Lymph Nodes in Neck or Axillae     Data Review:    CBC Recent Labs  Lab 08/08/19 2206 08/09/19 0751  WBC 15.8* 15.4*  HGB 3.9* 5.6*  HCT 13.1* 17.5*  PLT 367 332  MCV 90.3 88.4  MCH 26.9 28.3   MCHC 29.8* 32.0  RDW 15.3 14.6  LYMPHSABS 0.8  --   MONOABS 1.1*  --   EOSABS 0.0  --   BASOSABS 0.0  --    ------------------------------------------------------------------------------------------------------------------  Chemistries  Recent Labs  Lab 08/08/19 2206 08/09/19 0751  NA 139 140  K 3.6 3.7  CL 97* 98  CO2 31 32  GLUCOSE 78 73  BUN 72* 74*  CREATININE 2.91* 2.89*  CALCIUM 8.0* 8.0*  AST 42*  --   ALT 21  --   ALKPHOS 62  --   BILITOT 1.1  --    ------------------------------------------------------------------------------------------------------------------ estimated creatinine clearance is 23.7 mL/min (A) (by C-G formula based on SCr of 2.89 mg/dL (H)). ------------------------------------------------------------------------------------------------------------------ Recent Labs    08/09/19 0751  TSH 3.914    Coagulation profile Recent Labs  Lab 08/08/19 2206 08/09/19 0751 08/09/19 1545 08/10/19 0128  INR >10.0* 1.4* 1.3* 1.8*   ------------------------------------------------------------------------------------------------------------------- No results for input(s): DDIMER in the last 72 hours. -------------------------------------------------------------------------------------------------------------------  Cardiac Enzymes No results for input(s): CKMB, TROPONINI, MYOGLOBIN in the last 168 hours.  Invalid input(s): CK ------------------------------------------------------------------------------------------------------------------    Component Value Date/Time   BNP 257.7 (H) 11/24/2018 1354     ---------------------------------------------------------------------------------------------------------------  Urinalysis    Component Value Date/Time   COLORURINE YELLOW (A) 08/08/2019 2206   APPEARANCEUR TURBID (A) 08/08/2019 2206   LABSPEC 1.015 08/08/2019 2206   PHURINE 7.0 08/08/2019 2206   GLUCOSEU NEGATIVE 08/08/2019 2206   HGBUR  NEGATIVE 08/08/2019 2206   BILIRUBINUR NEGATIVE 08/08/2019 2206   BILIRUBINUR negative 07/24/2016 1303   KETONESUR NEGATIVE 08/08/2019 2206   PROTEINUR NEGATIVE 08/08/2019 2206   UROBILINOGEN negative 07/24/2016 1303   NITRITE NEGATIVE 08/08/2019 2206   LEUKOCYTESUR NEGATIVE 08/08/2019 2206    ----------------------------------------------------------------------------------------------------------------   Imaging Results:    Dg Chest Portable 1 View  Result Date: 08/08/2019 CLINICAL DATA:  Initial evaluation for acute altered mental status. EXAM: PORTABLE CHEST 1 VIEW COMPARISON:  Prior radiograph from 01/01/2019. FINDINGS: Cardiomegaly, grossly stable from previous. Mediastinal silhouette within normal limits. Aortic atherosclerosis. Lungs mildly hypoinflated. Diffuse pulmonary vascular congestion with interstitial prominence compatible with mild pulmonary interstitial edema. Superimposed dense opacity at the retrocardiac left lower lobe could reflect atelectasis or infiltrate or possibly effusion. Suspected small left pleural effusion. No pneumothorax. No acute osseous finding. Prominent degenerative changes noted about the shoulders bilaterally. IMPRESSION: 1. Cardiomegaly with mild diffuse pulmonary interstitial edema. Probable small left pleural effusion. 2. Superimposed dense opacity at the retrocardiac left lower lobe, which may reflect atelectasis, infiltrate, or effusion. 3.  Aortic Atherosclerosis (ICD10-I70.0). Electronically Signed   By: Jeannine Boga M.D.   On: 08/08/2019 22:29     Assessment & Plan:    Active Problems:   Hyperlipidemia   Essential hypertension   GI bleed   CKD (chronic kidney disease), stage IV (HCC)   AKI (acute kidney injury) (HCC)   Coagulopathy (HCC)   Atrial fibrillation, chronic (HCC)   Acute metabolic encephalopathy   Chronic blood loss anemia    Symptomatic anemia/chronic blood  loss anemia -Patient altered, frail, weak, hemoglobin 3.9  on admission, repeat 5.6 after 2 units, she received 2 units since, waiting repeat CBC, will transfuse to keep hemoglobin above 7. -Baseline hemoglobin is 7.7, this very likely chronic blood loss anemia in the setting of lower GI bleed.  GI bleed -This is most likely in the setting of coagulopathy, with INR less than 10 secondary to warfarin. -GI at [facility] consulted, initial plan for endoscopy were canceled given she is COVID-19 positive. -Continue with Protonix 40 mg IV twice daily, continue to monitor CBC closely, continue to hold warfarin.  COVID-19 infection -No evidence of pneumonia on imaging, she is afebrile, unclear if she is early or late in her COVID-19 infection, will obtain CRP, D-dimers, procalcitonin, will hold initiating steroids or remdesivir.  Chronic A. Fib -Warfarin currently on hold given GI bleed, will resume Coreg, but a lower dose at 25 mg p.o. twice daily given heart rate in the mid 50s currently  Coagulopathy -INR more than 10, secondary to warfarin, currently on hold, received IV vitamin K x2 as well.  AKI on CKD stage III -Creatinine 2.89 on admission, continue to monitor closely, will repeat BMP and avoid nephrotoxic medications  Diabetes mellitus -Keep on sliding scale during hospital stay  Acute metabolic encephalopathy -Was likely due to above, no focal deficits, appears to be improving  Hypertension -Resume home medications, but will hold lisinopril and decrease Coreg dose  Hyperlipidemia -Continue with home statin    DVT Prophylaxis  SCDs   AM Labs Ordered, also please review Full Orders  Family Communication: Admission, patients condition and plan of care including tests being ordered have been discussed with the patient who indicate understanding and agree with the plan and Code Status.  Code Status Full  Likely DC to   SNF  Condition GUARDED    Consults called: GI evaluated at [facility]  Admission status:   Inpatient  Time spent in  minutes : 60 minutes   Phillips Climes M.D on 08/10/2019 at 12:38 PM  Between 7am to 7pm - Pager - [phone]. After 7pm go to [email or web address] - password [facility]  [facility] - Office  [phone]

## 2019-08-10 NOTE — ED Notes (Signed)
No signs and symptoms of reaction. Patient with [facility] to [facility] with PRBC infusing.

## 2019-08-11 LAB — CBC WITH DIFFERENTIAL/PLATELET
Abs Immature Granulocytes: 0.09 10*3/uL — ABNORMAL HIGH (ref 0.00–0.07)
Basophils Absolute: 0 10*3/uL (ref 0.0–0.1)
Basophils Relative: 0 %
Eosinophils Absolute: 0.1 10*3/uL (ref 0.0–0.5)
Eosinophils Relative: 1 %
HCT: 22.1 % — ABNORMAL LOW (ref 36.0–46.0)
Hemoglobin: 7 g/dL — ABNORMAL LOW (ref 12.0–15.0)
Immature Granulocytes: 1 %
Lymphocytes Relative: 6 %
Lymphs Abs: 0.6 10*3/uL — ABNORMAL LOW (ref 0.7–4.0)
MCH: 29 pg (ref 26.0–34.0)
MCHC: 31.7 g/dL (ref 30.0–36.0)
MCV: 91.7 fL (ref 80.0–100.0)
Monocytes Absolute: 0.5 10*3/uL (ref 0.1–1.0)
Monocytes Relative: 6 %
Neutro Abs: 8.3 10*3/uL — ABNORMAL HIGH (ref 1.7–7.7)
Neutrophils Relative %: 86 %
Platelets: 265 10*3/uL (ref 150–400)
RBC: 2.41 MIL/uL — ABNORMAL LOW (ref 3.87–5.11)
RDW: 14.8 % (ref 11.5–15.5)
WBC: 9.6 10*3/uL (ref 4.0–10.5)
nRBC: 0.2 % (ref 0.0–0.2)

## 2019-08-11 LAB — TYPE AND SCREEN
ABO/RH(D): B POS
Antibody Screen: NEGATIVE
Unit division: 0
Unit division: 0
Unit division: 0
Unit division: 0

## 2019-08-11 LAB — BPAM RBC
Blood Product Expiration Date: 202012022359
Blood Product Expiration Date: 202012032359
Blood Product Expiration Date: 202012122359
Blood Product Expiration Date: 202012122359
ISSUE DATE / TIME: 202011080120
ISSUE DATE / TIME: 202011080527
ISSUE DATE / TIME: 202011081810
ISSUE DATE / TIME: 202011091046
Unit Type and Rh: 5100
Unit Type and Rh: 5100
Unit Type and Rh: 7300
Unit Type and Rh: 7300

## 2019-08-11 LAB — COMPREHENSIVE METABOLIC PANEL
ALT: 15 U/L (ref 0–44)
AST: 22 U/L (ref 15–41)
Albumin: 2 g/dL — ABNORMAL LOW (ref 3.5–5.0)
Alkaline Phosphatase: 66 U/L (ref 38–126)
Anion gap: 14 (ref 5–15)
BUN: 77 mg/dL — ABNORMAL HIGH (ref 8–23)
CO2: 28 mmol/L (ref 22–32)
Calcium: 7.9 mg/dL — ABNORMAL LOW (ref 8.9–10.3)
Chloride: 100 mmol/L (ref 98–111)
Creatinine, Ser: 2.92 mg/dL — ABNORMAL HIGH (ref 0.44–1.00)
GFR calc Af Amer: 18 mL/min — ABNORMAL LOW (ref >60)
GFR calc non Af Amer: 15 mL/min — ABNORMAL LOW (ref >60)
Glucose, Bld: 105 mg/dL — ABNORMAL HIGH (ref 70–99)
Potassium: 3.9 mmol/L (ref 3.5–5.1)
Sodium: 142 mmol/L (ref 135–145)
Total Bilirubin: 1.6 mg/dL — ABNORMAL HIGH (ref 0.3–1.2)
Total Protein: 5.4 g/dL — ABNORMAL LOW (ref 6.5–8.1)

## 2019-08-11 LAB — GLUCOSE, CAPILLARY
Glucose-Capillary: 103 mg/dL — ABNORMAL HIGH (ref 70–99)
Glucose-Capillary: 105 mg/dL — ABNORMAL HIGH (ref 70–99)
Glucose-Capillary: 146 mg/dL — ABNORMAL HIGH (ref 70–99)
Glucose-Capillary: 156 mg/dL — ABNORMAL HIGH (ref 70–99)

## 2019-08-11 LAB — PROTIME-INR
INR: 1.3 — ABNORMAL HIGH (ref 0.8–1.2)
Prothrombin Time: 16.1 seconds — ABNORMAL HIGH (ref 11.4–15.2)

## 2019-08-11 LAB — PREPARE RBC (CROSSMATCH)

## 2019-08-11 LAB — D-DIMER, QUANTITATIVE: D-Dimer, Quant: 1.81 ug/mL-FEU — ABNORMAL HIGH (ref 0.00–0.50)

## 2019-08-11 LAB — C-REACTIVE PROTEIN: CRP: 24.3 mg/dL — ABNORMAL HIGH (ref <1.0)

## 2019-08-11 LAB — ABO/RH: ABO/RH(D): B POS

## 2019-08-11 MED ORDER — FUROSEMIDE 10 MG/ML IJ SOLN
40.0000 mg | Freq: Two times a day (BID) | INTRAMUSCULAR | Status: DC
  Administered 2019-08-11 – 2019-08-16 (×11): 40 mg via INTRAVENOUS
  Filled 2019-08-11 (×11): qty 4

## 2019-08-11 MED ORDER — SODIUM CHLORIDE 0.9 % IV SOLN
100.0000 mg | INTRAVENOUS | Status: AC
  Administered 2019-08-12 – 2019-08-15 (×4): 100 mg via INTRAVENOUS
  Filled 2019-08-11 (×2): qty 20
  Filled 2019-08-11 (×2): qty 100

## 2019-08-11 MED ORDER — SODIUM CHLORIDE 0.9 % IV SOLN
500.0000 mg | INTRAVENOUS | Status: AC
  Administered 2019-08-11 – 2019-08-15 (×5): 500 mg via INTRAVENOUS
  Filled 2019-08-11 (×5): qty 500

## 2019-08-11 MED ORDER — FUROSEMIDE 10 MG/ML IJ SOLN
40.0000 mg | Freq: Once | INTRAMUSCULAR | Status: AC
  Administered 2019-08-11: 40 mg via INTRAVENOUS
  Filled 2019-08-11: qty 4

## 2019-08-11 MED ORDER — SODIUM CHLORIDE 0.9 % IV SOLN
200.0000 mg | Freq: Once | INTRAVENOUS | Status: AC
  Administered 2019-08-11: 200 mg via INTRAVENOUS
  Filled 2019-08-11: qty 40

## 2019-08-11 MED ORDER — POTASSIUM CHLORIDE CRYS ER 20 MEQ PO TBCR
40.0000 meq | EXTENDED_RELEASE_TABLET | Freq: Once | ORAL | Status: AC
  Administered 2019-08-11: 40 meq via ORAL
  Filled 2019-08-11: qty 2

## 2019-08-11 MED ORDER — GERHARDT'S BUTT CREAM
TOPICAL_CREAM | Freq: Three times a day (TID) | CUTANEOUS | Status: DC
  Administered 2019-08-11 – 2019-08-17 (×17): via TOPICAL
  Filled 2019-08-11: qty 1

## 2019-08-11 MED ORDER — SODIUM CHLORIDE 0.9% IV SOLUTION
Freq: Once | INTRAVENOUS | Status: AC
  Administered 2019-08-11: 12:00:00 via INTRAVENOUS

## 2019-08-11 MED ORDER — SODIUM CHLORIDE 0.9 % IV SOLN
1.0000 g | INTRAVENOUS | Status: AC
  Administered 2019-08-11 – 2019-08-15 (×5): 1 g via INTRAVENOUS
  Filled 2019-08-11 (×4): qty 10

## 2019-08-11 NOTE — Plan of Care (Signed)
Patient and daughter updated on plan of care, all questions answered

## 2019-08-11 NOTE — Progress Notes (Addendum)
PROGRESS NOTE                                                                                                                                                                                                             Patient Demographics:    Megan Obrien, is a 73 y.o. female, DOB - [birth year], WUJ:[id number]  Admit date - 08/10/2019   Admitting Physician Albertine Patricia, MD  Outpatient Primary MD for the patient is [name], MD  LOS - 1   No chief complaint on file.      Brief Narrative    Megan Obrien  is a 73 y.o. 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   Subjective:    Megan Obrien today resolved denies any complaints, she reports she had a good night sleep, he had a bowel movement yesterday, normal in color, no melena or hematochezia.   Assessment  & Plan :    Active Problems:   Hyperlipidemia   Essential hypertension   GI bleed   CKD (chronic kidney disease), stage IV (HCC)   AKI (acute kidney injury) (HCC)   Coagulopathy (HCC)   Atrial fibrillation, chronic (HCC)   Acute metabolic encephalopathy   Chronic blood loss anemia    Symptomatic anemia/chronic blood loss anemia -Presents altered, with progressive weakness and frailty, her hemoglobin was 3.9 on admission . -She had normal color stool, but she tested Hemoccult positive . -She was transfused so far  4 units patient altered, her hemoglobin responded appropriately, it is 7 this morning, given how still symptomatic is she weak and frail, will give another 2 units PRBC..  GI bleed -This is most likely in the setting of slow GI bleed on a patient with anticoagulation , who presents with coagulopathy INR >10. -He is Hemoccult positive stool, but her bowel movement color has  been normal . -She will need GI work-up possible endoscopy/colonoscopy when more stable and recovered from COVID-19 . -Continue with Protonix IV twice daily . -She was seen by GI Dr. [name] and [facility], for now he deferred her endoscopy, and cleared her to resume diet .  COVID-19 infection -CRP significantly elevated, chest x-ray significant for retrocardiac opacity, will start on remdesivir. -No hypoxia so we will hold on starting steroids. -Continue to trend inflammatory markers closely. -Was encouraged use incentive spirometry and flutter valve -Procalcitonin mildly elevated at 0.61, will start on Rocephin and azithromycin.  COVID-19 Labs  Recent Labs    08/10/19 1600 08/11/19 0210  DDIMER 1.71* 1.81*  CRP 29.4* 24.3*    Lab Results  Component Value Date   SARSCOV2NAA POSITIVE (A) 08/09/2019   SARSCOV2NAA NOT DETECTED 03/07/2019   [place] NEGATIVE 02/08/2019   Chronic A. Fib -Appears to be A. fib secondary to mitral stenosis, she is on warfarin for that , warfarin currently on hold given active GI bleed . -There was a questionable LV thrombus on 2D echo last year he warfarin currently on hold given GI bleed. -Was resumed on will resume Coreg, dose was 50 mg twice daily, has been lowered to 25 mg twice daily with good heart rate control . -I have discussed with the daughter that we are holding her anticoagulation in the setting of GI bleed, and this increases risk of stroke, discussed plan with her, so that likely will start short-term anticoagulation heparin versus Lovenox in 1 to 2 days as long her  hemoglobin is stable, discussed other options like Eliquis or Xarelto for long-term anticoagulation when she is stable to the warfarin, and she like to try Eliquis or Xarelto instead of warfarin.  Coagulopathy -INR >10, secondary to warfarin, currently on hold, received IV vitamin K x2 as well.  INR is 1.3 this morning  Acute on chronic diastolic CHF -Patient significantly volume overloaded on physical exam, chest x-ray and has elevated proBNP, she will be started on Lasix 40 mg IV twice daily, will monitor renal function closely.  AKI on CKD stage III -Plan creatinine around 2, 2.89 on admission, continue to monitor closely especially she was started on IV diuresis .  Diabetes mellitus -Keep on sliding scale during hospital stay  Acute metabolic encephalopathy -Was likely due to above, no focal deficits, appears to be improving  Hypertension -Resume home medications, but will hold lisinopril and decrease Coreg dose  Hyperlipidemia -Continue with home statin    Code Status : Full  Family Communication  : Discussed with daughter today  Disposition Plan  : back to SNF  Consults  :  GI at [facility]  Procedures  : none  DVT Prophylaxis  : SCD  Lab Results  Component Value Date   PLT 265 08/11/2019    Antibiotics  :    Anti-infectives (From admission, onward)   None        Objective:   Vitals:   08/11/19 0759 08/11/19 1100 08/11/19 1115 08/11/19 1130  BP: (!) 146/57 (!) 140/52 (!) 140/52 (!) 138/55  Pulse: 61 63 61 62  Resp: 20 18 14 19   Temp: 98.1 F (36.7 C) 97.6 F (36.4 C) 97.7 F (36.5 C) 97.7 F (36.5 C)  TempSrc: Axillary Oral Oral Oral  SpO2: 100%  98% 96%    Wt Readings from Last 3 Encounters:  08/08/19 (!) 138 kg  08/06/19 (!) 142 kg  02/06/19 129.3 kg     Intake/Output Summary (Last 24  hours) at 08/11/2019 1231 Last data filed at 08/11/2019 0500 Gross per 24 hour  Intake 180 ml  Output 302 ml  Net -122 ml     Physical  Exam  Awake, alert, obese, frail, communicative, responsive and answering questions appropriately Symmetrical Chest wall movement, less air entry at the bases, but no wheezing or rhonchi RRR,No Gallops,Rubs ,+ Murmurs, No Parasternal Heave +ve B.Sounds, Abd Soft, No tenderness, obese, no rebound - guarding or rigidity. No Cyanosis, Clubbing, +2 edema, No new Rash or bruise  .    Data Review:    CBC Recent Labs  Lab 08/08/19 2206 08/09/19 0751 08/10/19 1600 08/11/19 0210  WBC 15.8* 15.4* 11.7* 9.6  HGB 3.9* 5.6* 7.3* 7.0*  HCT 13.1* 17.5* 23.4* 22.1*  PLT 367 332 294 265  MCV 90.3 88.4 92.1 91.7  MCH 26.9 28.3 28.7 29.0  MCHC 29.8* 32.0 31.2 31.7  RDW 15.3 14.6 14.8 14.8  LYMPHSABS 0.8  --  0.7 0.6*  MONOABS 1.1*  --  0.6 0.5  EOSABS 0.0  --  0.1 0.1  BASOSABS 0.0  --  0.0 0.0    Chemistries  Recent Labs  Lab 08/08/19 2206 08/09/19 0751 08/10/19 1600 08/11/19 0210  NA 139 140 139 142  K 3.6 3.7 4.1 3.9  CL 97* 98 99 100  CO2 31 32 27 28  GLUCOSE 78 73 98 105*  BUN 72* 74* 74* 77*  CREATININE 2.91* 2.89* 2.99* 2.92*  CALCIUM 8.0* 8.0* 8.1* 7.9*  AST 42*  --  30 22  ALT 21  --  22 15  ALKPHOS 62  --  76 66  BILITOT 1.1  --  1.5* 1.6*   ------------------------------------------------------------------------------------------------------------------ No results for input(s): CHOL, HDL, LDLCALC, TRIG, CHOLHDL, LDLDIRECT in the last 72 hours.  Lab Results  Component Value Date   HGBA1C 12.9 (A) 03/24/2018   ------------------------------------------------------------------------------------------------------------------ Recent Labs    08/09/19 0751  TSH 3.914   ------------------------------------------------------------------------------------------------------------------ No results for input(s): VITAMINB12, FOLATE, FERRITIN, TIBC, IRON, RETICCTPCT in the last 72 hours.  Coagulation profile Recent Labs  Lab 08/09/19 0751 08/09/19 1545 08/10/19 0128  08/10/19 1600 08/11/19 0210  INR 1.4* 1.3* 1.8* 1.3* 1.3*    Recent Labs    08/10/19 1600 08/11/19 0210  DDIMER 1.71* 1.81*    Cardiac Enzymes No results for input(s): CKMB, TROPONINI, MYOGLOBIN in the last 168 hours.  Invalid input(s): CK ------------------------------------------------------------------------------------------------------------------    Component Value Date/Time   BNP 668.9 (H) 08/10/2019 1600    Inpatient Medications  Scheduled Meds: . acidophilus  1 capsule Oral BID  . atorvastatin  40 mg Oral QHS  . carvedilol  25 mg Oral BID WC  . docusate sodium  100 mg Oral BID  . FLUoxetine  20 mg Oral Daily  . furosemide  40 mg Intravenous Q12H  . hydrALAZINE  50 mg Oral QID  . insulin glargine  15 Units Subcutaneous QPM  . multivitamin with minerals  1 tablet Oral Daily  . pantoprazole (PROTONIX) IV  40 mg Intravenous Q12H  . rOPINIRole  0.25 mg Oral QHS  . sucralfate  1 g Oral TID WC & HS  . vitamin B-12  250 mcg Oral Daily   Continuous Infusions: PRN Meds:.acetaminophen  Micro Results Recent Results (from the past 240 hour(s))  Culture, blood (routine x 2)     Status: None (Preliminary result)   Collection Time: 08/08/19 10:06 PM   Specimen: BLOOD  Result Value Ref Range Status   Specimen Description  BLOOD LEFT ANTECUBITAL  Final   Special Requests   Final    BOTTLES DRAWN AEROBIC AND ANAEROBIC Blood Culture adequate volume   Culture   Final    NO GROWTH 3 DAYS Performed at [facility], [address], [place], [place] [address]    Report Status PENDING  Incomplete  Culture, blood (routine x 2)     Status: None (Preliminary result)   Collection Time: 08/08/19 10:07 PM   Specimen: BLOOD RIGHT HAND  Result Value Ref Range Status   Specimen Description BLOOD RIGHT HAND  Final   Special Requests   Final    BOTTLES DRAWN AEROBIC AND ANAEROBIC Blood Culture adequate volume   Culture   Final    NO GROWTH 3 DAYS Performed at [facility], [address], [place], [place] [address]    Report Status PENDING  Incomplete  SARS CORONAVIRUS 2 (TAT 6-24 HRS) Nasopharyngeal Nasopharyngeal Swab     Status: Abnormal   Collection Time: 08/09/19  1:20 AM   Specimen: Nasopharyngeal Swab  Result Value Ref Range Status   SARS Coronavirus 2 POSITIVE (A) NEGATIVE Final    Comment: RESULT CALLED TO, READ BACK BY AND VERIFIED WITH: ARIEL SMITH RN.@[email or web address]  ON 11.8.2020 BY TCALDWELL MT. (NOTE) SARS-CoV-2 target nucleic acids are DETECTED. The SARS-CoV-2 RNA is generally detectable in upper and lower respiratory specimens during the acute phase of infection. Positive results are indicative of active infection with SARS-CoV-2. Clinical  correlation with patient history and other diagnostic information is necessary to determine patient infection status. Positive results do  not rule out bacterial infection or co-infection with other viruses. The expected result is Negative. Fact Sheet for Patients: SugarRoll.be Fact Sheet for Healthcare Providers: https://www.woods-mathews.com/ This test is not yet approved or cleared by the [place] FDA and  has been authorized for detection and/or diagnosis of SARS-CoV-2 by FDA under an Emergency Use Authorization (EUA). This EUA will remain  in effect (meaning this test can  be used) for the duration of the COVID-19 declaration under Section 564(b)(1) of the Act, 21 U.S.C. section 360bbb-3(b)(1), unless the authorization is terminated or revoked sooner. Performed at [facility], [place] [address], [place], [place] [address]     Radiology Reports Dg Chest Portable 1 View  Result Date: 08/08/2019 CLINICAL DATA:  Initial evaluation for acute altered mental status. EXAM: PORTABLE CHEST 1 VIEW COMPARISON:  Prior radiograph from 01/01/2019. FINDINGS: Cardiomegaly, grossly stable from previous. Mediastinal silhouette within normal limits. Aortic  atherosclerosis. Lungs mildly hypoinflated. Diffuse pulmonary vascular congestion with interstitial prominence compatible with mild pulmonary interstitial edema. Superimposed dense opacity at the retrocardiac left lower lobe could reflect atelectasis or infiltrate or possibly effusion. Suspected small left pleural effusion. No pneumothorax. No acute osseous finding. Prominent degenerative changes noted about the shoulders bilaterally. IMPRESSION: 1. Cardiomegaly with mild diffuse pulmonary interstitial edema. Probable small left pleural effusion. 2. Superimposed dense opacity at the retrocardiac left lower lobe, which may reflect atelectasis, infiltrate, or effusion. 3.  Aortic Atherosclerosis (ICD10-I70.0). Electronically Signed   By: Jeannine Boga M.D.   On: 08/08/2019 22:29   Dg Hip Unilat With Pelvis 2-3 Views Left  Result Date: 08/06/2019 CLINICAL DATA:  Left hip and groin pain EXAM: DG HIP (WITH OR WITHOUT PELVIS) 2-3V LEFT COMPARISON:  None. FINDINGS: Left hip in internal rotation limiting radiographic evaluation. No displaced fracture. No pelvic fracture or mass lesion. Calcification overlying the sacrum likely lymph node or calcified uterine fibroid. Mild degenerative change  right hip joint. Soft tissue calcification around the greater trochanter on the right. Disc degeneration with disc space narrowing and spurring on the left L4-5. IMPRESSION: No displaced fracture. Internal rotation left hip limits radiographic evaluation. If the patient is not able to bear weight, further imaging evaluation recommended. Electronically Signed   By: Franchot Gallo M.D.   On: 08/06/2019 07:34      Phillips Climes M.D on 08/11/2019 at 12:31 PM  Between 7am to 7pm - Pager - [phone]  After 7pm go to [email or web address] - password [facility]  [facility] -  Office  [phone]

## 2019-08-11 NOTE — Consult Note (Signed)
[facility] Nurse wound consult note Please place an image in the patient's chart for the bilateral LEs, if able. The [facility] nurses are performing telehealth consultation for COVID-19 rule outs and COVID-19+ patients.  Thank you.   I am providing recommendations/orders after having reviewed the patient's chart and after speaking with the bedside nurse, S. Pyfrom.  Her assistance and knowledge of this patient is appreciated.  Reason for Consult: Bilateral LEs with weeping dermatitis, erythema.  The LLE is slightly worse than the RLE with more posterior weeping Wound type: Chronic venous insufficiency, recent infection, suspect cellulitis Pressure Injury POA: N/A Measurement: The  sacrum is with erythema, but blanches.  The buttocks, medial and posterior thighs and perineum with evidence of moisture associated skin damage, specifically incontinence associated dermatitis. The bilateral LEs are edematous, erythematous and warm.  There are scattered pink partial thickness open areas on the RLE with some areas of scabbing (dried serum).  The LLE has pinpoint open areas on the posterior aspect that are weeping moderate amounts of serous fluid.There is some maceration at the posterior left LE. Wound bed:As noted above Drainage (amount, consistency, odor) As noted above. No odor. Periwound: As noted above, LEs are erythematous, edematous, warm.  Buttocks, posterior and medial thighs are macerated.  Posterior left LE with maceration. Dressing procedure/placement/frequency: Patient with large body habitus is incontinent, but using a PurWick bedside urinary incontinence wicking device.  Gerhart's Butt cream is provided for the buttocks, medial and posterior thighs as well as the perineum.  This is a compounded, 1:1:1 cream with hydrocortisone, zinc oxide and lotrimin. Moisture management/management of microclimate is a cornerstone of the POC, so a mattress replacement is ordered. The LEs will be managed with a nonadherent,  antimicrobial astringent dressing (Xeroform) applied in a single layer and topped with ABDs to the posterior and anterior LEs. These will be secured with a Kerlix roll gauze wrap applied in a spiral fashion beginning just below toes and ending just below the knees. Heels will be floated to prevent pressure injury with Prevalon pressure redistribution heel boots. Dressings are to be changed to the legs twice daily. A sacral prophylactic foam dressing is to be placed over the sacrum to prevent pressure injury. Turning and repositioning per our house protocol is in place and will continue..  [facility] nursing team will not follow routinely, but will remain available to this patient, the nursing and medical teams.  Please re-consult if needed.  Thanks, Maudie Flakes, MSN, RN, [place], Arther Abbott  Pager# [phone]

## 2019-08-11 NOTE — Progress Notes (Signed)
Placed WOC order to address BLLE (calf) blisters/lesions some of which are draining. Red irritated.  IV abx initiated. 1U PRBCs given today 11/10 2nd unit is on the way. No complications. Pt daughter Santiago Glad was updated today on lab values & that pt is now on RA, advanced to clear diet & receiving 2u blood. Pt daughter gave consent-in chart.  Pt skin care/pericare complete to BLLE & peri-care. Purewick exchanged.

## 2019-08-12 ENCOUNTER — Inpatient Hospital Stay (HOSPITAL_COMMUNITY): Payer: Self-pay

## 2019-08-12 DIAGNOSIS — L8990 Pressure ulcer of unspecified site, unspecified stage: Secondary | ICD-10-CM | POA: Insufficient documentation

## 2019-08-12 DIAGNOSIS — J1289 Other viral pneumonia: Secondary | ICD-10-CM

## 2019-08-12 DIAGNOSIS — I10 Essential (primary) hypertension: Secondary | ICD-10-CM

## 2019-08-12 LAB — COMPREHENSIVE METABOLIC PANEL
ALT: 17 U/L (ref 0–44)
AST: 24 U/L (ref 15–41)
Albumin: 2 g/dL — ABNORMAL LOW (ref 3.5–5.0)
Alkaline Phosphatase: 71 U/L (ref 38–126)
Anion gap: 13 (ref 5–15)
BUN: 75 mg/dL — ABNORMAL HIGH (ref 8–23)
CO2: 28 mmol/L (ref 22–32)
Calcium: 8.2 mg/dL — ABNORMAL LOW (ref 8.9–10.3)
Chloride: 98 mmol/L (ref 98–111)
Creatinine, Ser: 2.63 mg/dL — ABNORMAL HIGH (ref 0.44–1.00)
GFR calc Af Amer: 20 mL/min — ABNORMAL LOW (ref >60)
GFR calc non Af Amer: 17 mL/min — ABNORMAL LOW (ref >60)
Glucose, Bld: 117 mg/dL — ABNORMAL HIGH (ref 70–99)
Potassium: 4.5 mmol/L (ref 3.5–5.1)
Sodium: 139 mmol/L (ref 135–145)
Total Bilirubin: 0.9 mg/dL (ref 0.3–1.2)
Total Protein: 5.8 g/dL — ABNORMAL LOW (ref 6.5–8.1)

## 2019-08-12 LAB — CBC WITH DIFFERENTIAL/PLATELET
Abs Immature Granulocytes: 0.09 10*3/uL — ABNORMAL HIGH (ref 0.00–0.07)
Basophils Absolute: 0 10*3/uL (ref 0.0–0.1)
Basophils Relative: 0 %
Eosinophils Absolute: 0.1 10*3/uL (ref 0.0–0.5)
Eosinophils Relative: 1 %
HCT: 27.1 % — ABNORMAL LOW (ref 36.0–46.0)
Hemoglobin: 8.4 g/dL — ABNORMAL LOW (ref 12.0–15.0)
Immature Granulocytes: 1 %
Lymphocytes Relative: 7 %
Lymphs Abs: 0.6 10*3/uL — ABNORMAL LOW (ref 0.7–4.0)
MCH: 28.1 pg (ref 26.0–34.0)
MCHC: 31 g/dL (ref 30.0–36.0)
MCV: 90.6 fL (ref 80.0–100.0)
Monocytes Absolute: 0.6 10*3/uL (ref 0.1–1.0)
Monocytes Relative: 7 %
Neutro Abs: 7.3 10*3/uL (ref 1.7–7.7)
Neutrophils Relative %: 84 %
Platelets: 268 10*3/uL (ref 150–400)
RBC: 2.99 MIL/uL — ABNORMAL LOW (ref 3.87–5.11)
RDW: 15.9 % — ABNORMAL HIGH (ref 11.5–15.5)
WBC: 8.7 10*3/uL (ref 4.0–10.5)
nRBC: 0.2 % (ref 0.0–0.2)

## 2019-08-12 LAB — GLUCOSE, CAPILLARY: Glucose-Capillary: 150 mg/dL — ABNORMAL HIGH (ref 70–99)

## 2019-08-12 LAB — PREPARE RBC (CROSSMATCH)

## 2019-08-12 LAB — PROTIME-INR
INR: 1.3 — ABNORMAL HIGH (ref 0.8–1.2)
Prothrombin Time: 15.7 seconds — ABNORMAL HIGH (ref 11.4–15.2)

## 2019-08-12 LAB — C-REACTIVE PROTEIN: CRP: 20.7 mg/dL — ABNORMAL HIGH (ref <1.0)

## 2019-08-12 LAB — D-DIMER, QUANTITATIVE: D-Dimer, Quant: 1.84 ug/mL-FEU — ABNORMAL HIGH (ref 0.00–0.50)

## 2019-08-12 MED ORDER — HYDRALAZINE HCL 20 MG/ML IJ SOLN
10.0000 mg | Freq: Four times a day (QID) | INTRAMUSCULAR | Status: DC | PRN
  Administered 2019-08-12 – 2019-08-13 (×2): 10 mg via INTRAVENOUS
  Filled 2019-08-12: qty 1
  Filled 2019-08-12: qty 0.5

## 2019-08-12 MED ORDER — SODIUM CHLORIDE 0.9% IV SOLUTION
Freq: Once | INTRAVENOUS | Status: AC
  Administered 2019-08-12: 05:00:00 via INTRAVENOUS

## 2019-08-12 MED ORDER — BOOST / RESOURCE BREEZE PO LIQD CUSTOM
1.0000 | Freq: Three times a day (TID) | ORAL | Status: DC
  Administered 2019-08-12: 21:00:00 via ORAL
  Administered 2019-08-12 – 2019-08-17 (×14): 1 via ORAL
  Filled 2019-08-12 (×19): qty 1

## 2019-08-12 NOTE — Consult Note (Signed)
[place] Nurse wound follow up Wound type: MASD, specifically intertriginous dermatitis at the subpannicular and inframammary skin folds Measurement:N/A Wound bed:N/A Drainage (amount, consistency, odor) N/A Periwound: intact Dressing procedure/placement/frequency: Contacted by K. Hill, PT who is working with patient today and noted areas at high risk for skin damage due to moisture accumulation in the skin folds mentioned above.  Together we decide to implement our house antimicrobial wicking textile, Estrella Deeds # [phone], for moisture management in skin folds.    [place] nursing team will not follow, but will remain available to this patient, the nursing and medical teams.  Please re-consult if needed. Thanks, Maudie Flakes, MSN, RN, [place], Arther Abbott  Pager# [phone]

## 2019-08-12 NOTE — Evaluation (Signed)
Physical Therapy Evaluation Patient Details Name: Megan Obrien MRN: [id number] DOB: 07/20/1946 Today's Date: 08/12/2019   History of Present Illness  73 yo female from [facility] sent to ED 08/06/19 for weakness and AMS, found with HG 3.9, INR >10, occult positive, also positive for COVID. PMH: obesity, CHF,HTN,GERD, CKD,CVA, afib/LV thrombus.  Clinical Impression  The patient is very limited in assisting with mobility, patient assisted with rolling and repositioning in bed. Patient's legs have weeping areas and noted redness in skin folds. WOC RN has orderd specilaty bed and interdry sheets  For wicking. The patient  Reports that she is from home. Per chart, patient is from [facility]. Patient will require a  mechanial lift currently until able to establish ability to stand. Marland Kitchentpmed     Follow Up Recommendations SNF    Equipment Recommendations  None recommended by PT    Recommendations for Other Services       Precautions / Restrictions Precautions Precautions: Fall Precaution Comments: at increased risk for skin breakdown, weeping legs Restrictions Weight Bearing Restrictions: No      Mobility  Bed Mobility Overal bed mobility: Needs Assistance Bed Mobility: Rolling Rolling: Max assist;Total assist;+2 for physical assistance;+2 for safety/equipment         General bed mobility comments: pt requiring heavy assist for rolling to L/R for pericare/changing bed linens. pt able to use UEs to self assist, requires assist to move LEs  Transfers                 General transfer comment: deferred, will likely require maximove  Ambulation/Gait                Stairs            Wheelchair Mobility    Modified Rankin (Stroke Patients Only)       Balance                                             Pertinent Vitals/Pain Pain Assessment: Faces Faces Pain Scale: Hurts even more Pain Location: bil LEs (RLE>LLE)  with movement Pain Descriptors / Indicators: Discomfort;Grimacing;Moaning Pain Intervention(s): Limited activity within patient's tolerance;Monitored during session    Home Living Family/patient expects to be discharged to:: Skilled nursing facility                 Additional Comments: pt reports she was living in apartment alone PTA (does not appear to have recollection of recently living in SNF)    Prior Function Level of Independence: Needs assistance   Gait / Transfers Assistance Needed: per chart review pt was ambulatory PTA, though ?question how recently she was. during last admisison in 12/2018 pt was ambulating short distances in apartment with RW  ADL's / Homemaking Assistance Needed: suspect requires assist from nursing staff        Hand Dominance        Extremity/Trunk Assessment   Upper Extremity Assessment Upper Extremity Assessment: Generalized weakness    Lower Extremity Assessment Lower Extremity Assessment: RLE deficits/detail;LLE deficits/detail RLE Deficits / Details: barely lifts leg from bed,noted bruisuing posterior leg. LLE Deficits / Details: groans when knee flexed a little, unable to lift from bed. LLE: Unable to fully assess due to pain    Cervical / Trunk Assessment Cervical / Trunk Assessment: Other exceptions Cervical / Trunk Exceptions: obese  Communication  Communication: HOH  Cognition Arousal/Alertness: Lethargic Behavior During Therapy: WFL for tasks assessed/performed;Flat affect Overall Cognitive Status: No family/caregiver present to determine baseline cognitive functioning Area of Impairment: Orientation;Attention;Memory;Following commands;Awareness                 Orientation Level: Disoriented to;Situation;Time Current Attention Level: Sustained Memory: Decreased recall of precautions;Decreased short-term memory Following Commands: Follows one step commands with increased time   Awareness: Intellectual    General Comments: pt in from SNF but reporting she lives home alone and is ambulatory, able to state she is in the hospital now but requires questioning cues      General Comments General comments (skin integrity, edema, etc.): PT spoke with wound care RN re: concerns for further skin breakdown    Exercises     Assessment/Plan    PT Assessment Patient needs continued PT services  PT Problem List Decreased strength;Decreased mobility;Decreased safety awareness;Decreased range of motion;Decreased knowledge of precautions;Obesity;Decreased activity tolerance;Decreased cognition;Cardiopulmonary status limiting activity;Decreased balance;Pain;Decreased skin integrity       PT Treatment Interventions DME instruction;Cognitive remediation;Therapeutic activities;Therapeutic exercise;Patient/family education;Functional mobility training    PT Goals (Current goals can be found in the Care Plan section)  Acute Rehab PT Goals Patient Stated Goal: none stated, agreeable to working with therapies PT Goal Formulation: Patient unable to participate in goal setting Time For Goal Achievement: 08/26/19 Potential to Achieve Goals: Fair    Frequency Min 2X/week   Barriers to discharge        Co-evaluation PT/OT/SLP Co-Evaluation/Treatment: Yes Reason for Co-Treatment: For patient/therapist safety PT goals addressed during session: Mobility/safety with mobility OT goals addressed during session: ADL's and self-care       AM-PAC PT "6 Clicks" Mobility  Outcome Measure Help needed turning from your back to your side while in a flat bed without using bedrails?: Total Help needed moving from lying on your back to sitting on the side of a flat bed without using bedrails?: Total Help needed moving to and from a bed to a chair (including a wheelchair)?: Total Help needed standing up from a chair using your arms (e.g., wheelchair or bedside chair)?: Total Help needed to walk in hospital room?:  Total Help needed climbing 3-5 steps with a railing? : Total 6 Click Score: 6    End of Session Equipment Utilized During Treatment: Oxygen Activity Tolerance: Patient limited by fatigue;No increased pain Patient left: in bed;with call bell/phone within reach;with bed alarm set Nurse Communication: Mobility status;Need for lift equipment PT Visit Diagnosis: Difficulty in walking, not elsewhere classified (R26.2);Pain Pain - Right/Left: Right Pain - part of body: Knee    Time: 5993-5701 PT Time Calculation (min) (ACUTE ONLY): 50 min   Charges:   PT Evaluation $PT Eval Moderate Complexity: 1 Mod PT Treatments $Therapeutic Activity: 8-22 mins        Tresa Endo PT Acute Rehabilitation Services 8 Office [phone]   Claretha Cooper 08/12/2019, 2:33 PM

## 2019-08-12 NOTE — Progress Notes (Signed)
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   Reason for Visit: Severe anemia.  COVID-19.  Consultants: Gastroenterology at [facility], Dr. [name]  Procedures: None  Antibiotics: Anti-infectives (From admission, onward)   Start     Dose/Rate Route Frequency Ordered Stop   08/12/19 1000  remdesivir 100 mg in sodium chloride 0.9 % 250 mL IVPB     100 mg 500 mL/hr over 30 Minutes Intravenous Every 24 hours 08/11/19 1308 08/16/19 0959   08/11/19 1400  azithromycin (ZITHROMAX) 500 mg in sodium chloride 0.9 % 250 mL IVPB     500 mg 250 mL/hr over 60 Minutes Intravenous Every 24 hours 08/11/19 1242     08/11/19 1400  remdesivir 200 mg in sodium chloride 0.9 % 250 mL IVPB     200 mg 500 mL/hr over 30 Minutes Intravenous Once 08/11/19 1308 08/11/19 1448   08/11/19 1300  cefTRIAXone (ROCEPHIN) 1 g in sodium chloride 0.9 % 100 mL IVPB     1 g 200 mL/hr over 30 Minutes Intravenous Every 24 hours 08/11/19 1242        Subjective/Interval History: Patient states that she is feeling well.  Denies any dizziness or lightheadedness.  Occasional dry cough.  No shortness of breath.  No nausea vomiting.    Assessment/Plan:  Acute Hypoxic Resp. Failure/Pneumonia due to  COVID-19  COVID-19 Labs  Recent Labs    08/10/19 1600 08/11/19 0210 08/12/19 0215  DDIMER 1.71* 1.81* 1.84*  CRP 29.4* 24.3* 20.7*    Lab Results  Component Value Date   SARSCOV2NAA POSITIVE (A) 08/09/2019   SARSCOV2NAA NOT DETECTED 03/07/2019   [place] NEGATIVE 02/08/2019     Fever: Afebrile Oxygen requirements: Alternating between room air and 2 L of oxygen by nasal cannula Antibacterials: Ceftriaxone and azithromycin Remdesivir: Day 2 Steroids: Not started on steroids Diuretics: On IV Lasix every 12 hours Actemra: Not given DVT Prophylaxis: None due to significantly elevated INR recently along with very low hemoglobin and suspicion of GI bleed  From a respiratory standpoint patient is noted to be stable.  She is requiring oxygen on and off.  There could be a component of sleep apnea due to her obesity.  Patient is on Remdesivir.  She is also noted to be on antibacterials due to elevated procalcitonin which was 0.612 days ago.  CRP noted to be elevated at 20.7 but improved from 29.42 days ago.  D-dimer 1.84.  Due to significant anemia and elevated INR and concern for GI bleed she was not started on steroids.  Continue to hold for now.  Symptomatic anemia/chronic blood loss anemia Presented with a hemoglobin of 3.9 on admission.  She had normal colored stool but was Hemoccult positive.  She did have significantly elevated INR.  Gastroenterology was consulted at [facility].  It was felt that she did not need EGD urgently due to her COVID-19  standpoint.  She has not had any overt bleeding.  She was transfused aggressively.  She was transfused 6 units of blood so far.  Hemoglobin has improved.  Continue to monitor.  Suspected GI bleed Was thought to be slow GI bleed in the setting of coagulopathy with INR greater than 10.  Had normal stool color.  Seen by gastroenterology, Dr. [name].  Holding off on EGD and colonoscopy for now.  Continue PPI.  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 Fib.  Patient noted to have a moderate mitral stenosis on echocardiogram.  Her echocardiogram from last year showed the thrombus.  She has had 2 episodes when her INR have gone greater than 10 in the last few months.  Holding warfarin due to significant anemia and suspicion for GI bleed.  Family is aware of risk of stroke.  Will repeat an echocardiogram and may need to discuss this further with the patient's cardiologist which is Dr. Fletcher Anon.  She saw him last in May of this year.  Coagulopathy INR was greater than 10 secondary to warfarin.  She has had this previously as well.  She received vitamin K x2.  INR has improved.  Acute on chronic diastolic CHF She was volume overloaded on examination.  Started on IV Lasix twice daily.  Continue for now.  Monitor renal function closely.  Acute kidney injury on chronic kidney disease stage III Baseline creatinine around 2.  It was 2.89 on admission.  Has been stable.  2.63 this morning.  Recheck labs tomorrow.  Monitor urine output.  Diabetes mellitus type 2, uncontrolled with hyperglycemia On glimepiride at home.  Continue SSI.  HbA1c is from 2019 when it was 12.9.  We will check it again tomorrow.  Acute metabolic encephalopathy Likely due to acute illness as discussed above.  No focal deficits.  Seems to be improving.  Essential hypertension Holding lisinopril.  On Coreg.  Monitor blood pressures closely.  Hyperlipidemia Continue with statin.  Pressure injury Pressure Injury 08/10/19 Sacrum Mid Stage II -  Partial thickness loss of dermis presenting as a shallow open ulcer with a red, pink wound bed without slough. sarum red (Active)  08/10/19 1300  Location: Sacrum  Location Orientation: Mid  Staging: Stage II -  Partial thickness loss of dermis presenting as a shallow open ulcer with a red,  pink wound bed without slough.  Wound Description (Comments): sarum red  Present on Admission: Yes   Morbid obesity Estimated body mass index is 52.41 kg/m as calculated from the following:   Height as of 08/08/19: 5\' 4"  (1.626 m).   Weight as of this encounter: 138.5 kg.  DVT Prophylaxis: SCDs PUD Prophylaxis: PPI Code Status: Full code Family Communication: Discussed with the patient. will call her daughter as well Disposition Plan: Management as outlined above.   Medications:  Scheduled: . acidophilus  1 capsule Oral BID  . atorvastatin  40 mg Oral QHS  . carvedilol  25 mg Oral BID WC  . docusate sodium  100 mg Oral BID  . feeding supplement  1 Container Oral TID BM  . FLUoxetine  20 mg Oral Daily  . furosemide  40 mg Intravenous Q12H  . Gerhardt's butt cream   Topical TID  . hydrALAZINE  50 mg Oral QID  . insulin glargine  15 Units Subcutaneous QPM  . multivitamin with minerals  1 tablet Oral  Daily  . pantoprazole (PROTONIX) IV  40 mg Intravenous Q12H  . rOPINIRole  0.25 mg Oral QHS  . sucralfate  1 g Oral TID WC & HS  . vitamin B-12  250 mcg Oral Daily   Continuous: . azithromycin 500 mg (08/12/19 1348)  . cefTRIAXone (ROCEPHIN)  IV 1 g (08/12/19 1237)  . remdesivir 100 mg in NS 250 mL Stopped (08/12/19 1016)   OBS:[id number]   Objective:  Vital Signs  Vitals:   08/12/19 0635 08/12/19 0700 08/12/19 0800 08/12/19 0845  BP: (!) 150/68 (!) 180/79 (!) 136/101 (!) 172/66  Pulse: 64 65 63 71  Resp: (!) 21 17 15 20   Temp: 98.1 F (36.7 C)  98.1 F (36.7 C) 97.9 F (36.6 C)  TempSrc: Oral  Oral Oral  SpO2: 94% 93% 99% 96%  Weight:        Intake/Output Summary (Last 24 hours) at 08/12/2019 1438 Last data filed at 08/12/2019 1237 Gross per 24 hour  Intake 2395 ml  Output 2300 ml  Net 95 ml   Filed Weights   08/12/19 0500  Weight: (!) 138.5 kg    General appearance: Awake alert.  In no distress.  Morbidly obese Resp: Normal effort at rest.  Coarse  breath sound bilaterally with crackles at the bases.  No wheezing or rhonchi. Cardio: S1-S2 is normal regular.  No S3-S4.  No rubs murmurs or bruit GI: Abdomen is soft.  Nontender nondistended.  Bowel sounds are present normal.  No masses organomegaly Extremities: Edema noted bilateral lower extremities Neurologic: No obvious focal neurological deficits.   Lab Results:  Data Reviewed: I have personally reviewed following labs and imaging studies  CBC: Recent Labs  Lab 08/08/19 2206 08/09/19 0751 08/10/19 1600 08/11/19 0210 08/12/19 0215  WBC 15.8* 15.4* 11.7* 9.6 8.7  NEUTROABS 13.8*  --  10.2* 8.3* 7.3  HGB 3.9* 5.6* 7.3* 7.0* 8.4*  HCT 13.1* 17.5* 23.4* 22.1* 27.1*  MCV 90.3 88.4 92.1 91.7 90.6  PLT 367 332 294 265 465    Basic Metabolic Panel: Recent Labs  Lab 08/08/19 2206 08/09/19 0751 08/10/19 1600 08/11/19 0210 08/12/19 0215  NA 139 140 139 142 139  K 3.6 3.7 4.1 3.9 4.5  CL 97* 98 99 100 98  CO2 31 32 27 28 28   GLUCOSE 78 73 98 105* 117*  BUN 72* 74* 74* 77* 75*  CREATININE 2.91* 2.89* 2.99* 2.92* 2.63*  CALCIUM 8.0* 8.0* 8.1* 7.9* 8.2*    GFR: Estimated Creatinine Clearance: 26.1 mL/min (A) (by C-G formula based on SCr of 2.63 mg/dL (H)).  Liver Function Tests: Recent Labs  Lab 08/08/19 2206 08/10/19 1600 08/11/19 0210 08/12/19 0215  AST 42* 30 22 24   ALT 21 22 15 17   ALKPHOS 62 76 66 71  BILITOT 1.1 1.5* 1.6* 0.9  PROT 5.7* 6.2* 5.4* 5.8*  ALBUMIN 2.1* 2.3* 2.0* 2.0*     Coagulation Profile: Recent Labs  Lab 08/09/19 1545 08/10/19 0128 08/10/19 1600 08/11/19 0210 08/12/19 0215  INR 1.3* 1.8* 1.3* 1.3* 1.3*     CBG: Recent Labs  Lab 08/11/19 0346 08/11/19 0758 08/11/19 1139 08/11/19 1650 08/12/19 1106  GLUCAP 103* 105* 146* 156* 150*     Recent Results (from the past 240 hour(s))  Culture, blood (routine x 2)     Status: None (Preliminary result)   Collection Time: 08/08/19 10:06 PM   Specimen: BLOOD  Result Value  Ref Range Status   Specimen Description BLOOD LEFT ANTECUBITAL  Final  Special Requests   Final    BOTTLES DRAWN AEROBIC AND ANAEROBIC Blood Culture adequate volume   Culture   Final    NO GROWTH 4 DAYS Performed at [facility], [place] [place], [place] [address]    Report Status PENDING  Incomplete  Culture, blood (routine x 2)     Status: None (Preliminary result)   Collection Time: 08/08/19 10:07 PM   Specimen: BLOOD RIGHT HAND  Result Value Ref Range Status   Specimen Description BLOOD RIGHT HAND  Final   Special Requests   Final    BOTTLES DRAWN AEROBIC AND ANAEROBIC Blood Culture adequate volume   Culture   Final    NO GROWTH 4 DAYS Performed at [facility], [address] [place], [place] [address]    Report Status PENDING  Incomplete  SARS CORONAVIRUS 2 (TAT 6-24 HRS) Nasopharyngeal Nasopharyngeal Swab     Status: Abnormal   Collection Time: 08/09/19  1:20 AM   Specimen: Nasopharyngeal Swab  Result Value Ref Range Status   SARS Coronavirus 2 POSITIVE (A) NEGATIVE Final    Comment: RESULT CALLED TO, READ BACK BY AND VERIFIED WITH: ARIEL SMITH RN.@[email or web address]  ON 11.8.2020 BY TCALDWELL MT. (NOTE) SARS-CoV-2 target nucleic acids are DETECTED. The SARS-CoV-2 RNA is generally detectable in upper and lower respiratory specimens during the acute phase of infection. Positive results are indicative of active infection with SARS-CoV-2. Clinical  correlation with patient history and other diagnostic information is necessary to determine patient infection status. Positive results do  not rule out bacterial infection or co-infection with other viruses. The expected result is Negative. Fact Sheet for Patients: SugarRoll.be Fact Sheet for Healthcare Providers: https://www.woods-mathews.com/ This test is not yet approved or cleared by the [place] FDA and  has been authorized for detection and/or diagnosis of  SARS-CoV-2 by FDA under an Emergency Use Authorization (EUA). This EUA will remain  in effect (meaning this test can  be used) for the duration of the COVID-19 declaration under Section 564(b)(1) of the Act, 21 U.S.C. section 360bbb-3(b)(1), unless the authorization is terminated or revoked sooner. Performed at [facility], [place] [address] [place], [place] [address]       Radiology Studies: No results found.     LOS: 2 days   [facility] Pager on [email or web address]  08/12/2019, 2:38 PM

## 2019-08-12 NOTE — Progress Notes (Signed)
Initial Nutrition Assessment  DOCUMENTATION CODES:   Morbid obesity  INTERVENTION:   Boost Breeze po TID, each supplement provides 250 kcal and 9 grams of protein.  Diet advancement as able.  NUTRITION DIAGNOSIS:   Increased nutrient needs related to acute illness(COVID-19) as evidenced by estimated needs.  GOAL:   Patient will meet greater than or equal to 90% of their needs  MONITOR:   Diet advancement, PO intake, Supplement acceptance, Labs, Skin  REASON FOR ASSESSMENT:   Malnutrition Screening Tool    ASSESSMENT:   73 yo female admitted with weakness, AMS. Work-up revealed positive COVID-19 and Hgb 3.9. PMH includes DM-2, HLD, HTN, CKD, mitral stenosis, CHF, morbid obesity, OSA, stroke, CVD.   Endoscopy is on hold due to COVID diagnosis. Patient has received multiple blood transfusions since admission. She had a normal non bloody BM yesterday. She remains on a clear liquid diet.   Labs reviewed.  CBG's: [id number]  Medications reviewed and include acidophilus, colace, lasix, lantus, MVI, vitamin B-12.  Weight encounters reviewed; no significant weight changes over the past 6 months.  NUTRITION - FOCUSED PHYSICAL EXAM:  deferred  Diet Order:   Diet Order            Diet clear liquid Room service appropriate? Yes; Fluid consistency: Thin  Diet effective now              EDUCATION NEEDS:   Not appropriate for education at this time  Skin:  Skin Assessment: Skin Integrity Issues: Skin Integrity Issues:: Stage II, Other (Comment) Stage II: sacrum Other: MASD to subpannicular and inframammary skin folds, and groin   Last BM:  11/10 type 7  Height:   Ht Readings from Last 1 Encounters:  08/08/19 5\' 4"  (1.626 m)    Weight:   Wt Readings from Last 1 Encounters:  08/12/19 (!) 138.5 kg    Ideal Body Weight:  54.5 kg  BMI:  Body mass index is 52.41 kg/m.  Estimated Nutritional Needs:   Kcal:  2000-2200  Protein:  110-125 gm  Fluid:   >/= 2 L    Molli Barrows, RD, LDN, [place] Pager [phone] After Hours Pager [phone]

## 2019-08-12 NOTE — Evaluation (Addendum)
Occupational Therapy Evaluation Patient Details Name: Megan Obrien MRN: [id number] DOB: [birth year] Today's Date: 08/12/2019    History of Present Illness 73 yo female from [facility] sent to ED 08/06/19 for weakness and AMS, found with HG 3.9, INR >10, occult positive, also positive for COVID. PMH: obesity, CHF,HTN,GERD, CKD,CVA, afib/LV thrombus.   Clinical Impression   This 73 y/o female presents with the above. Pt in from SNF, however pt reporting living alone in an apartment PTA (in further review pt was living in apartment up until around April 2020), was ambulatory in SNF up until right before hospital admission. Pt now requiring significant assist for mobility and ADL tasks. She currently requires two person assist for bed mobility, totalA (+2) for pericare after episode of incontinence. Pt notably confused during session but is able to follow simple commands given increased time/cues. Also noted pt with redness/purple to buttocks region and redness noted around pannus region. PT notified wound care RN to inquire about air mattress and interdry to further address. Pt offweighted and positioned onto L side end of session. She will likely benefit from air mattress given increased risk for skin breakdown/pressure wounds. Pt will benefit from continued acute OT services and recommend further OT services in SNF setting after discharge to progress pt towards her PLOF. Will follow.     Follow Up Recommendations  SNF;Supervision/Assistance - 24 hour    Equipment Recommendations  Other (comment)(defer to next venue)           Precautions / Restrictions Precautions Precautions: Fall Precaution Comments: at increased risk for skin breakdown, weeping legs Restrictions Weight Bearing Restrictions: No      Mobility Bed Mobility Overal bed mobility: Needs Assistance Bed Mobility: Rolling Rolling: Max assist;Total assist;+2 for physical assistance;+2 for safety/equipment          General bed mobility comments: pt requiring heavy assist for rolling to L/R for pericare/changing bed linens. pt able to use UEs to self assist, requires assist to move LEs  Transfers                 General transfer comment: deferred, will likely require maximove    Balance                                           ADL either performed or assessed with clinical judgement   ADL Overall ADL's : Needs assistance/impaired Eating/Feeding: Set up;Sitting   Grooming: Set up;Min guard;Sitting;Bed level   Upper Body Bathing: Moderate assistance;Minimal assistance;Sitting   Lower Body Bathing: Total assistance;+2 for safety/equipment;+2 for physical assistance;Bed level   Upper Body Dressing : Moderate assistance;Maximal assistance;Sitting   Lower Body Dressing: Total assistance;+2 for physical assistance;+2 for safety/equipment;Bed level       Toileting- Clothing Manipulation and Hygiene: Total assistance;+2 for physical assistance;+2 for safety/equipment;Bed level Toileting - Clothing Manipulation Details (indicate cue type and reason): pt had had incontinent BM start of session; requires +2 assist for bed mobility, pericare and changing of bed linens.      Functional mobility during ADLs: Maximal assistance;Total assistance;+2 for physical assistance;+2 for safety/equipment(bed mobility only) General ADL Comments: pt at increased risk for skin breakdown, very weak and with decreased mobility/ADL status                         Pertinent Vitals/Pain Pain Assessment: Faces Faces  Pain Scale: Hurts even more Pain Location: bil LEs (RLE>LLE) with movement Pain Descriptors / Indicators: Discomfort;Grimacing;Moaning Pain Intervention(s): Limited activity within patient's tolerance;Monitored during session     Hand Dominance     Extremity/Trunk Assessment Upper Extremity Assessment Upper Extremity Assessment: Generalized weakness   Lower  Extremity Assessment Lower Extremity Assessment: Defer to PT evaluation   Cervical / Trunk Assessment Cervical / Trunk Assessment: Other exceptions Cervical / Trunk Exceptions: obese   Communication Communication Communication: HOH   Cognition Arousal/Alertness: Lethargic Behavior During Therapy: WFL for tasks assessed/performed;Flat affect Overall Cognitive Status: No family/caregiver present to determine baseline cognitive functioning Area of Impairment: Orientation;Attention;Memory;Following commands;Awareness                 Orientation Level: Disoriented to;Situation;Time Current Attention Level: Sustained Memory: Decreased recall of precautions;Decreased short-term memory Following Commands: Follows one step commands with increased time   Awareness: Intellectual   General Comments: pt in from SNF but reporting she lives home alone and is ambulatory, able to state she is in the hospital now but requires questioning cues   General Comments  PT spoke with wound care RN re: concerns for further skin breakdown    Exercises     Shoulder Instructions      Home Living Family/patient expects to be discharged to:: Skilled nursing facility                                 Additional Comments: pt reports she was living in apartment alone PTA (does not appear to have recollection of recently living in SNF)      Prior Functioning/Environment Level of Independence: Needs assistance  Gait / Transfers Assistance Needed: per chart review pt was ambulatory PTA, though ?question how recently she was. during last admisison in 12/2018 pt was ambulating short distances in apartment with RW ADL's / Homemaking Assistance Needed: suspect requires assist from nursing staff            OT Problem List: Decreased strength;Decreased range of motion;Decreased activity tolerance;Impaired balance (sitting and/or standing);Decreased safety awareness;Decreased knowledge of use of  DME or AE;Obesity;Cardiopulmonary status limiting activity;Decreased cognition;Pain      OT Treatment/Interventions: Self-care/ADL training;Therapeutic exercise;Energy conservation;DME and/or AE instruction;Therapeutic activities;Patient/family education;Balance training    OT Goals(Current goals can be found in the care plan section) Acute Rehab OT Goals Patient Stated Goal: none stated, agreeable to working with therapies OT Goal Formulation: With patient Time For Goal Achievement: 08/26/19 Potential to Achieve Goals: Fair  OT Frequency: Min 2X/week   Barriers to D/C:            Co-evaluation PT/OT/SLP Co-Evaluation/Treatment: Yes Reason for Co-Treatment: For patient/therapist safety PT goals addressed during session: Mobility/safety with mobility OT goals addressed during session: ADL's and self-care      AM-PAC OT "6 Clicks" Daily Activity     Outcome Measure Help from another person eating meals?: A Little Help from another person taking care of personal grooming?: A Lot Help from another person toileting, which includes using toliet, bedpan, or urinal?: Total Help from another person bathing (including washing, rinsing, drying)?: A Lot Help from another person to put on and taking off regular upper body clothing?: A Lot Help from another person to put on and taking off regular lower body clothing?: Total 6 Click Score: 11   End of Session Nurse Communication: Mobility status  Activity Tolerance: Patient tolerated treatment well Patient left: in bed;with call  bell/phone within reach;with bed alarm set  OT Visit Diagnosis: Muscle weakness (generalized) (M62.81);Other abnormalities of gait and mobility (R26.89)                Time: 0312-8118 OT Time Calculation (min): 49 min Charges:  OT General Charges $OT Visit: 1 Visit OT Evaluation $OT Eval Moderate Complexity: 1 Mod OT Treatments $Self Care/Home Management : 8-22 mins  Lou Cal, OT Supplemental  Rehabilitation Services Pager [phone] Office [phone]   Raymondo Band 08/12/2019, 2:23 PM

## 2019-08-12 NOTE — Progress Notes (Addendum)
0730: Assumed care of Pt from night RN. PRBC infusing, Pt denies pain, SOB, VS WNL except for elevated BP, MD has been made aware. Pt denies needs at this time, call bell within reach  1100: Pt claims sugar is low prior to working with PT, checked aqd FS 150. Will monitor.  1300: After checking on Pt, Pt found to have removed two PIV's, remains confused and disoriented as with this AM. Bed alarm remains on and call bell within reach. Staff called again for bariatric bed and low air-loss mattress for Pt. Awaiting arrival of bed. Interdry applied to skin folds, no xeroform at this time.   1605: Reassessed, Pt remains anxious and confused. BP elevated 190/71, MD made aware. Otherwise, denies needs at this time. [place] working well, repositioned, call bell within reach

## 2019-08-12 NOTE — NC FL2 (Signed)
[place] MEDICAID FL2 LEVEL OF CARE SCREENING TOOL     IDENTIFICATION  Patient Name: Megan Obrien Birthdate: 07/18/1946 Sex: female Admission Date (Current Location): 08/10/2019  [facility] and [place] Number:  Herbalist and Address:  [facility], [place] [address], [place], [place] [address]([address]  Room 9127)      Provider Number: [id number]  Attending Physician Name and Address:  Bonnielee Haff, MD  Relative Name and Phone Number:  Daughter: [name] [phone]    Current Level of Care: Hospital Recommended Level of Care: [place] Prior Approval Number:    Date Approved/Denied:   PASRR Number: [id number]  Discharge Plan: SNF    Current Diagnoses: Patient Active Problem List   Diagnosis Date Noted  . Pressure injury of skin 08/12/2019  . Chronic blood loss anemia 08/10/2019  . Morbid obesity ([place])   . CKD (chronic kidney disease), stage IV ([place])   . AKI (acute kidney injury) ([place])   . Hemorrhagic shock ([place])   . Acute blood loss anemia   . Coagulopathy ([place])   . Moderate mitral stenosis   . Atrial fibrillation, chronic ([place])   . Acute metabolic encephalopathy   . GI bleed 08/08/2019  . Sepsis ([place]) 01/01/2019  . History of medication noncompliance 05/26/2018  . Vaginal bleeding 02/06/2018  . Expressive aphasia 12/18/2017  . Stroke ([place]) 12/18/2017  . Mitral valve disease   . Exertional dyspnea 04/17/2017  . Fatigue 02/20/2017  . 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 Medicare annual wellness visit, subsequent 07/24/2016  . Hyperlipidemia 02/06/2016  . Essential hypertension 02/06/2016  . Type 2 diabetes mellitus with complication, without long-term current use of insulin ([place]) 02/06/2016  . Depression 02/06/2016  . GERD (gastroesophageal reflux disease) 02/06/2016  . Urge incontinence of urine 02/06/2016    Orientation RESPIRATION BLADDER Height & Weight     Self  Normal Incontinent Weight: (!)  138.5 kg Height:     BEHAVIORAL SYMPTOMS/MOOD NEUROLOGICAL BOWEL NUTRITION STATUS      Incontinent Diet  AMBULATORY STATUS COMMUNICATION OF NEEDS Skin   Extensive Assist Verbally Other (Comment)(legs weeping)                       Personal Care Assistance Level of Assistance  Total care       Total Care Assistance: Maximum assistance   Functional Limitations Info             SPECIAL CARE FACTORS FREQUENCY  PT (By licensed PT), OT (By licensed OT)     PT Frequency: 5x/week OT Frequency: min 3x/week            Contractures Contractures Info: Not present    Additional Factors Info  Code Status Code Status Info: full             Current Medications (08/12/2019):  This is the current hospital active medication list Current Facility-Administered Medications  Medication Dose Route Frequency Provider Last Rate Last Dose  . acetaminophen (TYLENOL) tablet 650 mg  650 mg Oral Q8H PRN Elgergawy, Silver Huguenin, MD      . acidophilus (RISAQUAD) capsule 1 capsule  1 capsule Oral BID Elgergawy, Silver Huguenin, MD   1 capsule at 08/12/19 0917  . atorvastatin (LIPITOR) tablet 40 mg  40 mg Oral QHS Elgergawy, Silver Huguenin, MD   40 mg at 08/11/19 2140  . azithromycin (ZITHROMAX) 500 mg in sodium chloride 0.9 % 250 mL IVPB  500  mg Intravenous Q24H Bonnielee Haff, MD 250 mL/hr at 08/12/19 1348 500 mg at 08/12/19 1348  . carvedilol (COREG) tablet 25 mg  25 mg Oral BID WC Elgergawy, Silver Huguenin, MD   25 mg at 08/12/19 0811  . cefTRIAXone (ROCEPHIN) 1 g in sodium chloride 0.9 % 100 mL IVPB  1 g Intravenous Q24H Bonnielee Haff, MD 200 mL/hr at 08/12/19 1237 1 g at 08/12/19 1237  . docusate sodium (COLACE) capsule 100 mg  100 mg Oral BID Elgergawy, Silver Huguenin, MD   100 mg at 08/12/19 0917  . feeding supplement (BOOST / RESOURCE BREEZE) liquid 1 Container  1 Container Oral TID BM Bonnielee Haff, MD      . FLUoxetine (PROZAC) capsule 20 mg  20 mg Oral Daily Elgergawy, Silver Huguenin, MD   20 mg at 08/12/19  0942  . furosemide (LASIX) injection 40 mg  40 mg Intravenous Q12H Elgergawy, Silver Huguenin, MD   40 mg at 08/12/19 0918  . Gerhardt's butt cream   Topical TID Elgergawy, Silver Huguenin, MD      . hydrALAZINE (APRESOLINE) tablet 50 mg  50 mg Oral QID Elgergawy, Silver Huguenin, MD   50 mg at 08/12/19 1344  . insulin glargine (LANTUS) injection 15 Units  15 Units Subcutaneous QPM Elgergawy, Silver Huguenin, MD   15 Units at 08/11/19 1739  . multivitamin with minerals tablet 1 tablet  1 tablet Oral Daily Elgergawy, Silver Huguenin, MD   1 tablet at 08/12/19 0917  . pantoprazole (PROTONIX) injection 40 mg  40 mg Intravenous Q12H Elgergawy, Silver Huguenin, MD   40 mg at 08/12/19 0919  . remdesivir 100 mg in sodium chloride 0.9 % 250 mL IVPB  100 mg Intravenous Q24H Elgergawy, Silver Huguenin, MD   Stopped at 08/12/19 1016  . rOPINIRole (REQUIP) tablet 0.25 mg  0.25 mg Oral QHS Elgergawy, Silver Huguenin, MD   0.25 mg at 08/11/19 2139  . sucralfate (CARAFATE) tablet 1 g  1 g Oral TID WC & HS Elgergawy, Silver Huguenin, MD   1 g at 08/12/19 1234  . vitamin B-12 (CYANOCOBALAMIN) tablet 250 mcg  250 mcg Oral Daily Elgergawy, Silver Huguenin, MD   250 mcg at 08/12/19 9833     Discharge Medications: Please see discharge summary for a list of discharge medications.  Relevant Imaging Results:  Relevant Lab Results:   Additional Information SSN [id number]  Ninfa Meeker, RN

## 2019-08-13 ENCOUNTER — Inpatient Hospital Stay (HOSPITAL_COMMUNITY): Payer: Medicare Other

## 2019-08-13 DIAGNOSIS — I4891 Unspecified atrial fibrillation: Secondary | ICD-10-CM

## 2019-08-13 LAB — COMPREHENSIVE METABOLIC PANEL
ALT: 15 U/L (ref 0–44)
AST: 22 U/L (ref 15–41)
Albumin: 2.1 g/dL — ABNORMAL LOW (ref 3.5–5.0)
Alkaline Phosphatase: 69 U/L (ref 38–126)
Anion gap: 14 (ref 5–15)
BUN: 69 mg/dL — ABNORMAL HIGH (ref 8–23)
CO2: 28 mmol/L (ref 22–32)
Calcium: 8.2 mg/dL — ABNORMAL LOW (ref 8.9–10.3)
Chloride: 99 mmol/L (ref 98–111)
Creatinine, Ser: 2.38 mg/dL — ABNORMAL HIGH (ref 0.44–1.00)
GFR calc Af Amer: 23 mL/min — ABNORMAL LOW (ref >60)
GFR calc non Af Amer: 19 mL/min — ABNORMAL LOW (ref >60)
Glucose, Bld: 150 mg/dL — ABNORMAL HIGH (ref 70–99)
Potassium: 3.8 mmol/L (ref 3.5–5.1)
Sodium: 141 mmol/L (ref 135–145)
Total Bilirubin: 1.3 mg/dL — ABNORMAL HIGH (ref 0.3–1.2)
Total Protein: 5.6 g/dL — ABNORMAL LOW (ref 6.5–8.1)

## 2019-08-13 LAB — BPAM RBC
Blood Product Expiration Date: 202012102359
Blood Product Expiration Date: 202012122359
Blood Product Expiration Date: 202012122359
Blood Product Expiration Date: 202012132359
ISSUE DATE / TIME: 202011101014
ISSUE DATE / TIME: 202011110139
ISSUE DATE / TIME: 202011111256
Unit Type and Rh: 7300
Unit Type and Rh: 7300
Unit Type and Rh: 7300
Unit Type and Rh: 7300

## 2019-08-13 LAB — TYPE AND SCREEN
ABO/RH(D): B POS
Antibody Screen: NEGATIVE
Unit division: 0
Unit division: 0
Unit division: 0
Unit division: 0

## 2019-08-13 LAB — CBC WITH DIFFERENTIAL/PLATELET
Abs Immature Granulocytes: 0.1 10*3/uL — ABNORMAL HIGH (ref 0.00–0.07)
Basophils Absolute: 0 10*3/uL (ref 0.0–0.1)
Basophils Relative: 0 %
Eosinophils Absolute: 0.1 10*3/uL (ref 0.0–0.5)
Eosinophils Relative: 1 %
HCT: 30.2 % — ABNORMAL LOW (ref 36.0–46.0)
Hemoglobin: 9.6 g/dL — ABNORMAL LOW (ref 12.0–15.0)
Immature Granulocytes: 1 %
Lymphocytes Relative: 7 %
Lymphs Abs: 0.7 10*3/uL (ref 0.7–4.0)
MCH: 28.4 pg (ref 26.0–34.0)
MCHC: 31.8 g/dL (ref 30.0–36.0)
MCV: 89.3 fL (ref 80.0–100.0)
Monocytes Absolute: 0.8 10*3/uL (ref 0.1–1.0)
Monocytes Relative: 9 %
Neutro Abs: 7.9 10*3/uL — ABNORMAL HIGH (ref 1.7–7.7)
Neutrophils Relative %: 82 %
Platelets: 302 10*3/uL (ref 150–400)
RBC: 3.38 MIL/uL — ABNORMAL LOW (ref 3.87–5.11)
RDW: 15.3 % (ref 11.5–15.5)
WBC: 9.6 10*3/uL (ref 4.0–10.5)
nRBC: 0 % (ref 0.0–0.2)

## 2019-08-13 LAB — D-DIMER, QUANTITATIVE: D-Dimer, Quant: 2.43 ug/mL-FEU — ABNORMAL HIGH (ref 0.00–0.50)

## 2019-08-13 LAB — ECHOCARDIOGRAM COMPLETE: Weight: 6368 oz

## 2019-08-13 LAB — CULTURE, BLOOD (ROUTINE X 2)
Culture: NO GROWTH
Culture: NO GROWTH
Special Requests: ADEQUATE
Special Requests: ADEQUATE

## 2019-08-13 LAB — PROTIME-INR
INR: 1.5 — ABNORMAL HIGH (ref 0.8–1.2)
Prothrombin Time: 17.5 seconds — ABNORMAL HIGH (ref 11.4–15.2)

## 2019-08-13 LAB — HEMOGLOBIN A1C
Hgb A1c MFr Bld: 5.6 % (ref 4.8–5.6)
Mean Plasma Glucose: 114.02 mg/dL

## 2019-08-13 LAB — GLUCOSE, CAPILLARY
Glucose-Capillary: 216 mg/dL — ABNORMAL HIGH (ref 70–99)
Glucose-Capillary: 171 mg/dL — ABNORMAL HIGH (ref 70–99)

## 2019-08-13 LAB — C-REACTIVE PROTEIN: CRP: 17.1 mg/dL — ABNORMAL HIGH (ref <1.0)

## 2019-08-13 MED ORDER — PANTOPRAZOLE SODIUM 40 MG PO TBEC
40.0000 mg | DELAYED_RELEASE_TABLET | Freq: Two times a day (BID) | ORAL | Status: DC
  Administered 2019-08-13 – 2019-08-17 (×8): 40 mg via ORAL
  Filled 2019-08-13 (×8): qty 1

## 2019-08-13 MED ORDER — POTASSIUM CHLORIDE CRYS ER 20 MEQ PO TBCR
40.0000 meq | EXTENDED_RELEASE_TABLET | Freq: Once | ORAL | Status: AC
  Administered 2019-08-13: 40 meq via ORAL
  Filled 2019-08-13: qty 2

## 2019-08-13 MED ORDER — NYSTATIN 100000 UNIT/GM EX CREA
TOPICAL_CREAM | Freq: Three times a day (TID) | CUTANEOUS | Status: DC
  Administered 2019-08-13 – 2019-08-17 (×13): via TOPICAL
  Filled 2019-08-13: qty 15

## 2019-08-13 NOTE — Progress Notes (Addendum)
[id number]: Assumed care of Pt from night RN. Pt denies pain, SOB, VS WNL. Echo tech at bedside for test. Helped Pt onto and off bedpan, full bed change, peri care performed, and repositioned. Call bell within reach  1200: Bed placed into chair position. Pt set up and eating lunch. Call bell within reach  1600: Reassessed, resting comfortably in bed. Bed switched out of chair position. Pt denies needs at this time, will monitor  1800: Left message for daughter to call back for updates. Pt resting comfortably, call bell within reach  1805: Daughter called back and all questions answered. Confirmed doctor and staff are keeping them well informed of Pt status and care.

## 2019-08-13 NOTE — Progress Notes (Signed)
PROGRESS NOTE  Megan Obrien:[id number] DOB: [birth year] DOA: 08/10/2019  PCP: Megan Pitch, MD  Brief History/Interval Summary: [name] a33 y.o.female,with obesity, CHF, hypertension, dyslipidemia, GERD, stage III chronic kidney disease, CVA, chronic A. fib/LV thrombus on warfarin, who is long-term resident at [facility], she was sent to ED for generalized weakness, altered mental status, and being pale, work-up significant for hemoglobin of 3.9, baseline around 8, she is Hemoccult positive but no frank melena or hematochezia, transfused 4 units PRBC with good response, hemoglobin is 7, seen by GI at [facility], plan was for endoscopy, but her COVID-19 was positive, so she was transferred to [facility] for further management.   Reason for Visit: Severe anemia.  COVID-19.  Consultants: Gastroenterology at [facility], Megan Obrien  Procedures:   Transthoracic echocardiogram 11/12  1. There is moderate mitral stenosis due to severe mitral annular calcification. The mean gradient is 7.5 mmHg with heart rate of 63 bpm. The MVA by continuity is 1.50 cm2. When compared with the most recent study 12/18/2017, there is no significant  change in the mitral valve gradients. The rhythm is noted to be NSR with frequent PACs.  2. The tricuspid regurgitant velocity is 2.90 m/s, and with an assumed right atrial pressure of 15 mmHg, the estimated right ventricular systolic pressure is mildly elevated at 48.6 mmHg.  3. PA pressure estimated ~48/27 mmHg. mPAP ~ 34 mmHg which is mild pulmonary HTN.  4. Left ventricular ejection fraction, by visual estimation, is 60 to 65%. The left ventricle has normal function. There is mildly increased left ventricular hypertrophy.  5. Global right ventricle has normal systolic function.The right ventricular size is normal. No increase in right ventricular wall thickness.  6. Left atrial size was moderately dilated.  7. Right atrial size was  normal.  8. Severe mitral annular calcification.  9. The mitral valve is degenerative. Mild mitral valve regurgitation. Moderate mitral stenosis. 10. The tricuspid valve is grossly normal. Tricuspid valve regurgitation is trivial. 11. The aortic valve is tricuspid. Aortic valve regurgitation is not visualized. Mild to moderate aortic valve sclerosis/calcification without any evidence of aortic stenosis. 12. The pulmonic valve was grossly normal. Pulmonic valve regurgitation is mild. 13. Mildly elevated pulmonary artery systolic pressure. 14. The left ventricle has no regional wall motion abnormalities. 15. The inferior vena cava is dilated in size with <50% respiratory variability, suggesting right atrial pressure of 15 mmHg. 16. Trivial pericardial effusion is present. 17. The pericardial effusion is circumferential.    Antibiotics: Anti-infectives (From admission, onward)   Start     Dose/Rate Route Frequency Ordered Stop   08/12/19 1000  remdesivir 100 mg in sodium chloride 0.9 % 250 mL IVPB     100 mg 500 mL/hr over 30 Minutes Intravenous Every 24 hours 08/11/19 1308 08/16/19 0959   08/11/19 1400  azithromycin (ZITHROMAX) 500 mg in sodium chloride 0.9 % 250 mL IVPB     500 mg 250 mL/hr over 60 Minutes Intravenous Every 24 hours 08/11/19 1242 08/16/19 1359   08/11/19 1400  remdesivir 200 mg in sodium chloride 0.9 % 250 mL IVPB     200 mg 500 mL/hr over 30 Minutes Intravenous Once 08/11/19 1308 08/11/19 1448   08/11/19 1300  cefTRIAXone (ROCEPHIN) 1 g in sodium chloride 0.9 % 100 mL IVPB     1 g 200 mL/hr over 30 Minutes Intravenous Every 24 hours 08/11/19 1242 08/16/19 1259      Subjective/Interval History: Patient noted to be distracted this  morning.  Mildly confused.  Denies any shortness of breath or chest pain.  No abdominal pain.    Assessment/Plan:  Acute Hypoxic Resp. Failure/Pneumonia due to COVID-19  COVID-19 Labs  Recent Labs    08/11/19 0210 08/12/19 0215  08/13/19 0000  DDIMER 1.81* 1.84* 2.43*  CRP 24.3* 20.7* 17.1*    Lab Results  Component Value Date   SARSCOV2NAA POSITIVE (A) 08/09/2019   SARSCOV2NAA NOT DETECTED 03/07/2019   [name] NEGATIVE 02/08/2019     Fever: Remains afebrile Oxygen requirements: Has been transitioned to room air saturating in the 90s.   Antibacterials: Ceftriaxone and azithromycin Remdesivir: Day 3 Steroids: Not started on steroids Diuretics: On IV Lasix every 12 hours Actemra: Not given DVT Prophylaxis: None due to significantly elevated INR recently along with very low hemoglobin and suspicion of GI bleed  Patient seems to be stable from a respiratory standpoint.  She has been transitioned to room air.  Continue with the remdesivir.  No steroids due to GI bleed.  Continue with antibiotics as well to complete a 5-day course.  Procalcitonin was elevated at 0.6.  CRP was elevated at 29.4.  Improved to 17.1 today.  D-dimer 2.43.  Due to concern for GI bleed she has not been started on any anticoagulation.  We will stop checking D-dimers.  Symptomatic anemia/chronic blood loss anemia Presented with a hemoglobin of 3.9 on admission.  She had normal colored stool but was Hemoccult positive.  She did have significantly elevated INR.  Gastroenterology was consulted at [facility].  It was felt that she did not need EGD urgently due to her COVID-19 standpoint.  She has not had any overt bleeding.  She was transfused aggressively.  She was transfused 6 units of blood so far.  Hemoglobin has responded appropriately and remained stable.  No evidence of overt bleeding noted.  Suspected GI bleed Was thought to be slow GI bleed in the setting of coagulopathy with INR greater than 10.  Had normal stool color.  Seen by gastroenterology, Megan Obrien.  Holding off on EGD and colonoscopy for now.  Continue PPI.  Change to oral.  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 Fib.  Patient noted to have a moderate mitral stenosis on echocardiogram.  Her echocardiogram from last year showed the thrombus.  She has had 2 episodes when her INR have gone greater than 10 in the last few months.  Holding warfarin due to significant anemia and suspicion for GI bleed.  Family is aware of risk of stroke.  Echocardiogram repeated here.  No mention of any clot in the left atrium.  Continues to show moderate mitral stenosis.  Trivial pericardial effusion was noted.  Discussed with Dr. Fletcher Anon as to plan for anticoagulation.  In view of the fact that she had life-threatening anemia and has not been able to have endoscopy, to see if she did have a source of bleeding or not, it is felt that this time that putting her on anticoagulation will increase her risk of rebleed significantly.  Recommendation is to hold off on anticoagulation.  Dr. Fletcher Anon will arrange follow-up in his clinic in 2 to 3 weeks.    Coagulopathy INR was greater than 10 secondary to warfarin.  She has had this previously as well.  She received vitamin K x2.  INR has improved.  Acute on chronic diastolic CHF She was volume overloaded  on examination.  Started on IV Lasix twice daily.  Continue for now.  Monitor renal function closely.  Supplement potassium.  Acute kidney injury on chronic kidney disease stage III Baseline creatinine around 2.  It was 2.89 on admission.  Creatinine has been slowly improving.  Down to 2.38 today.  Continue to monitor urine output.    Diabetes mellitus type 2, uncontrolled with hyperglycemia On glimepiride at home.  Continue SSI.  HbA1c was 12.9 in 2019.  Noted to be 5.6 during this hospitalization. .  Acute metabolic encephalopathy Likely due to acute illness as discussed above.  No focal deficits.  Continue to monitor.  Essential hypertension Holding lisinopril.  On Coreg.  Monitor blood pressures  closely.  Hyperlipidemia Continue with statin.  Pressure injury Pressure Injury 08/10/19 Sacrum Mid Stage II -  Partial thickness loss of dermis presenting as a shallow open ulcer with a red, pink wound bed without slough. sarum red (Active)  08/10/19 1300  Location: Sacrum  Location Orientation: Mid  Staging: Stage II -  Partial thickness loss of dermis presenting as a shallow open ulcer with a red, pink wound bed without slough.  Wound Description (Comments): sarum red  Present on Admission: Yes   Morbid obesity Estimated body mass index is 68.32 kg/m as calculated from the following:   Height as of 08/08/19: 5\' 4"  (1.626 m).   Weight as of this encounter: 180.5 kg.  DVT Prophylaxis: SCDs PUD Prophylaxis: PPI Code Status: Full code Family Communication: Discussed with patient.  Discussed with the daughter yesterday. Disposition Plan: Mobilize.  Continue treatments as outlined above.   Medications:  Scheduled: . acidophilus  1 capsule Oral BID  . atorvastatin  40 mg Oral QHS  . carvedilol  25 mg Oral BID WC  . docusate sodium  100 mg Oral BID  . feeding supplement  1 Container Oral TID BM  . FLUoxetine  20 mg Oral Daily  . furosemide  40 mg Intravenous Q12H  . Gerhardt's butt cream   Topical TID  . hydrALAZINE  50 mg Oral QID  . insulin glargine  15 Units Subcutaneous QPM  . multivitamin with minerals  1 tablet Oral Daily  . nystatin cream   Topical TID  . pantoprazole (PROTONIX) IV  40 mg Intravenous Q12H  . rOPINIRole  0.25 mg Oral QHS  . sucralfate  1 g Oral TID WC & HS  . vitamin B-12  250 mcg Oral Daily   Continuous: . azithromycin Stopped (08/12/19 1500)  . cefTRIAXone (ROCEPHIN)  IV Stopped (08/12/19 1300)  . remdesivir 100 mg in NS 250 mL 100 mg (08/13/19 0912)   YKD:[id number], hydrALAZINE   Objective:  Vital Signs  Vitals:   08/12/19 2000 08/13/19 0354 08/13/19 0431 08/13/19 0748  BP: (!) 163/80 140/75  (!) 174/92  Pulse: 62 61  73  Resp: 18  16  19   Temp:  98.2 F (36.8 C)  (!) 97.4 F (36.3 C)  TempSrc:  Oral  Oral  SpO2: 96% 94%  96%  Weight:   (!) 180.5 kg     Intake/Output Summary (Last 24 hours) at 08/13/2019 1140 Last data filed at 08/13/2019 0912 Gross per 24 hour  Intake 1850 ml  Output 2150 ml  Net -300 ml   Filed Weights   08/12/19 0500 08/13/19 0431  Weight: (!) 138.5 kg (!) 180.5 kg    General appearance: Awake alert.  In no distress.  Distracted.  Morbidly obese. Resp: Crackles at the bases.  No wheezing or rhonchi.   Cardio: S1-S2 is normal regular.  No S3-S4.  No rubs murmurs or bruit GI: Abdomen is soft.  Nontender nondistended.  Bowel sounds are present normal.  No masses organomegaly Extremities: Edema bilateral lower extremities Neurologic: She is alert.  Distracted.  Disoriented.  No obvious focal deficits.    Lab Results:  Data Reviewed: I have personally reviewed following labs and imaging studies  CBC: Recent Labs  Lab 08/08/19 2206 08/09/19 0751 08/10/19 1600 08/11/19 0210 08/12/19 0215 08/13/19 0000  WBC 15.8* 15.4* 11.7* 9.6 8.7 9.6  NEUTROABS 13.8*  --  10.2* 8.3* 7.3 7.9*  HGB 3.9* 5.6* 7.3* 7.0* 8.4* 9.6*  HCT 13.1* 17.5* 23.4* 22.1* 27.1* 30.2*  MCV 90.3 88.4 92.1 91.7 90.6 89.3  PLT 367 332 294 265 268 465    Basic Metabolic Panel: Recent Labs  Lab 08/09/19 0751 08/10/19 1600 08/11/19 0210 08/12/19 0215 08/13/19 0000  NA 140 139 142 139 141  K 3.7 4.1 3.9 4.5 3.8  CL 98 99 100 98 99  CO2 32 27 28 28 28   GLUCOSE 73 98 105* 117* 150*  BUN 74* 74* 77* 75* 69*  CREATININE 2.89* 2.99* 2.92* 2.63* 2.38*  CALCIUM 8.0* 8.1* 7.9* 8.2* 8.2*    GFR: Estimated Creatinine Clearance: 34.4 mL/min (A) (by C-G formula based on SCr of 2.38 mg/dL (H)).  Liver Function Tests: Recent Labs  Lab 08/08/19 2206 08/10/19 1600 08/11/19 0210 08/12/19 0215 08/13/19 0000  AST 42* 30 22 24 22   ALT 21 22 15 17 15   ALKPHOS 62 76 66 71 69  BILITOT 1.1 1.5* 1.6* 0.9 1.3*   PROT 5.7* 6.2* 5.4* 5.8* 5.6*  ALBUMIN 2.1* 2.3* 2.0* 2.0* 2.1*     Coagulation Profile: Recent Labs  Lab 08/10/19 0128 08/10/19 1600 08/11/19 0210 08/12/19 0215 08/13/19 0000  INR 1.8* 1.3* 1.3* 1.3* 1.5*     CBG: Recent Labs  Lab 08/11/19 1139 08/11/19 1650 08/12/19 1106 08/12/19 1737 08/12/19 2211  GLUCAP 146* 156* 150* 216* 171*     Recent Results (from the past 240 hour(s))  Culture, blood (routine x 2)     Status: None   Collection Time: 08/08/19 10:06 PM   Specimen: BLOOD  Result Value Ref Range Status   Specimen Description BLOOD LEFT ANTECUBITAL  Final   Special Requests   Final    BOTTLES DRAWN AEROBIC AND ANAEROBIC Blood Culture adequate volume   Culture   Final    NO GROWTH 5 DAYS Performed at [facility], [address] [place], [place] [address]    Report Status 08/13/2019 FINAL  Final  Culture, blood (routine x 2)     Status: None   Collection Time: 08/08/19 10:07 PM   Specimen: BLOOD RIGHT HAND  Result Value Ref Range Status   Specimen Description BLOOD RIGHT HAND  Final   Special Requests   Final    BOTTLES DRAWN AEROBIC AND ANAEROBIC Blood Culture adequate volume   Culture   Final    NO GROWTH 5 DAYS Performed at [facility], [address], [place], [place] [address]    Report Status 08/13/2019 FINAL  Final  SARS CORONAVIRUS 2 (TAT 6-24 HRS) Nasopharyngeal Nasopharyngeal Swab     Status: Abnormal   Collection Time: 08/09/19  1:20 AM   Specimen: Nasopharyngeal Swab  Result Value Ref Range Status   SARS Coronavirus 2 POSITIVE (A) NEGATIVE Final    Comment: RESULT CALLED TO, READ BACK BY AND  VERIFIED WITH: ARIEL SMITH RN.@[email or web address]  ON 11.8.2020 BY TCALDWELL MT. (NOTE) SARS-CoV-2 target nucleic acids are DETECTED. The SARS-CoV-2 RNA is generally detectable in upper and lower respiratory specimens during the acute phase of infection. Positive results are indicative of active infection with SARS-CoV-2. Clinical   correlation with patient history and other diagnostic information is necessary to determine patient infection status. Positive results do  not rule out bacterial infection or co-infection with other viruses. The expected result is Negative. Fact Sheet for Patients: SugarRoll.be Fact Sheet for Healthcare Providers: https://www.woods-mathews.com/ This test is not yet approved or cleared by the [place] FDA and  has been authorized for detection and/or diagnosis of SARS-CoV-2 by FDA under an Emergency Use Authorization (EUA). This EUA will remain  in effect (meaning this test can  be used) for the duration of the COVID-19 declaration under Section 564(b)(1) of the Act, 21 U.S.C. section 360bbb-3(b)(1), unless the authorization is terminated or revoked sooner. Performed at [facility], [place] [address], [place], [place] [address]       Radiology Studies: No results found.     LOS: 3 days   Zarrah Lovel[place] [employer] on [email or web address]  08/13/2019, 11:40 AM

## 2019-08-13 NOTE — Plan of Care (Addendum)
Leg dressing applied as ordered. Pt was transferred to bariatric overlay bed without issues. Prev boots still not available. Heels elevated via pillow.  Problem: Coping: Goal: Psychosocial and spiritual needs will be supported Outcome: Progressing   Problem: Respiratory: Goal: Will maintain a patent airway Outcome: Progressing Goal: Complications related to the disease process, condition or treatment will be avoided or minimized Outcome: Progressing   Problem: Clinical Measurements: Goal: Ability to maintain clinical measurements within normal limits will improve Outcome: Progressing Goal: Will remain free from infection Outcome: Progressing Goal: Diagnostic test results will improve Outcome: Progressing Goal: Respiratory complications will improve Outcome: Progressing Goal: Cardiovascular complication will be avoided Outcome: Progressing   Problem: Activity: Goal: Risk for activity intolerance will decrease Outcome: Progressing   Problem: Nutrition: Goal: Adequate nutrition will be maintained Outcome: Progressing   Problem: Coping: Goal: Level of anxiety will decrease Outcome: Progressing   Problem: Elimination: Goal: Will not experience complications related to bowel motility Outcome: Progressing Goal: Will not experience complications related to urinary retention Outcome: Progressing   Problem: Pain Managment: Goal: General experience of comfort will improve Outcome: Progressing   Problem: Safety: Goal: Ability to remain free from injury will improve Outcome: Progressing

## 2019-08-13 NOTE — Progress Notes (Signed)
  Echocardiogram 2D Echocardiogram has been performed.  Megan Obrien M 08/13/2019, 8:28 AM

## 2019-08-14 LAB — CBC WITH DIFFERENTIAL/PLATELET
Abs Immature Granulocytes: 0.11 10*3/uL — ABNORMAL HIGH (ref 0.00–0.07)
Basophils Absolute: 0 10*3/uL (ref 0.0–0.1)
Basophils Relative: 0 %
Eosinophils Absolute: 0.2 10*3/uL (ref 0.0–0.5)
Eosinophils Relative: 2 %
HCT: 32.6 % — ABNORMAL LOW (ref 36.0–46.0)
Hemoglobin: 10.1 g/dL — ABNORMAL LOW (ref 12.0–15.0)
Immature Granulocytes: 1 %
Lymphocytes Relative: 7 %
Lymphs Abs: 0.7 10*3/uL (ref 0.7–4.0)
MCH: 28.1 pg (ref 26.0–34.0)
MCHC: 31 g/dL (ref 30.0–36.0)
MCV: 90.8 fL (ref 80.0–100.0)
Monocytes Absolute: 1.1 10*3/uL — ABNORMAL HIGH (ref 0.1–1.0)
Monocytes Relative: 10 %
Neutro Abs: 8 10*3/uL — ABNORMAL HIGH (ref 1.7–7.7)
Neutrophils Relative %: 80 %
Platelets: 329 10*3/uL (ref 150–400)
RBC: 3.59 MIL/uL — ABNORMAL LOW (ref 3.87–5.11)
RDW: 15.5 % (ref 11.5–15.5)
WBC: 10.1 10*3/uL (ref 4.0–10.5)
nRBC: 0 % (ref 0.0–0.2)

## 2019-08-14 LAB — COMPREHENSIVE METABOLIC PANEL
ALT: 15 U/L (ref 0–44)
AST: 23 U/L (ref 15–41)
Albumin: 2 g/dL — ABNORMAL LOW (ref 3.5–5.0)
Alkaline Phosphatase: 66 U/L (ref 38–126)
Anion gap: 12 (ref 5–15)
BUN: 67 mg/dL — ABNORMAL HIGH (ref 8–23)
CO2: 30 mmol/L (ref 22–32)
Calcium: 8.2 mg/dL — ABNORMAL LOW (ref 8.9–10.3)
Chloride: 99 mmol/L (ref 98–111)
Creatinine, Ser: 2.15 mg/dL — ABNORMAL HIGH (ref 0.44–1.00)
GFR calc Af Amer: 25 mL/min — ABNORMAL LOW (ref >60)
GFR calc non Af Amer: 22 mL/min — ABNORMAL LOW (ref >60)
Glucose, Bld: 192 mg/dL — ABNORMAL HIGH (ref 70–99)
Potassium: 3.6 mmol/L (ref 3.5–5.1)
Sodium: 141 mmol/L (ref 135–145)
Total Bilirubin: 1.2 mg/dL (ref 0.3–1.2)
Total Protein: 5.7 g/dL — ABNORMAL LOW (ref 6.5–8.1)

## 2019-08-14 LAB — PROTIME-INR
INR: 1.6 — ABNORMAL HIGH (ref 0.8–1.2)
Prothrombin Time: 18.7 seconds — ABNORMAL HIGH (ref 11.4–15.2)

## 2019-08-14 LAB — GLUCOSE, CAPILLARY: Glucose-Capillary: 181 mg/dL — ABNORMAL HIGH (ref 70–99)

## 2019-08-14 LAB — C-REACTIVE PROTEIN: CRP: 15.8 mg/dL — ABNORMAL HIGH (ref <1.0)

## 2019-08-14 MED ORDER — POTASSIUM CHLORIDE CRYS ER 20 MEQ PO TBCR
40.0000 meq | EXTENDED_RELEASE_TABLET | Freq: Once | ORAL | Status: AC
  Administered 2019-08-14: 40 meq via ORAL
  Filled 2019-08-14: qty 2

## 2019-08-14 MED ORDER — CARVEDILOL 12.5 MG PO TABS
12.5000 mg | ORAL_TABLET | Freq: Two times a day (BID) | ORAL | Status: DC
  Administered 2019-08-15 – 2019-08-17 (×5): 12.5 mg via ORAL
  Filled 2019-08-14 (×5): qty 1

## 2019-08-14 NOTE — Progress Notes (Addendum)
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   Reason for Visit: Severe anemia.  COVID-19.  Consultants:  Gastroenterology at [facility], Dr. [name] Phone discussion with her cardiologist Dr. Fletcher Anon  Procedures:   Transthoracic echocardiogram 11/12  1. There is moderate mitral stenosis due to severe mitral annular calcification. The mean gradient is 7.5 mmHg with heart rate of 63 bpm. The MVA by continuity is 1.50 cm2. When compared with the most recent study 12/18/2017, there is no significant  change in the mitral valve gradients. The rhythm is noted to be NSR with frequent PACs.  2. The tricuspid regurgitant velocity is 2.90 m/s, and with an assumed right atrial pressure of 15 mmHg, the estimated right ventricular systolic pressure is mildly elevated at 48.6 mmHg.  3. PA pressure estimated ~48/27 mmHg. mPAP ~ 34 mmHg which is mild pulmonary HTN.  4. Left ventricular ejection fraction, by visual estimation, is 60 to 65%. The left ventricle has normal function. There is mildly increased left ventricular hypertrophy.  5. Global right ventricle has normal systolic function.The right ventricular size is normal. No increase in right ventricular wall thickness.  6. Left atrial  size was moderately dilated.  7. Right atrial size was normal.  8. Severe mitral annular calcification.  9. The mitral valve is degenerative. Mild mitral valve regurgitation. Moderate mitral stenosis. 10. The tricuspid valve is grossly normal. Tricuspid valve regurgitation is trivial. 11. The aortic valve is tricuspid. Aortic valve regurgitation is not visualized. Mild to moderate aortic valve sclerosis/calcification without any evidence of aortic stenosis. 12. The pulmonic valve was grossly normal. Pulmonic valve regurgitation is mild. 13. Mildly elevated pulmonary artery systolic pressure. 14. The left ventricle has no regional wall motion abnormalities. 15. The inferior vena cava is dilated in size with <50% respiratory variability, suggesting right atrial pressure of 15 mmHg. 16. Trivial pericardial effusion is present. 17. The pericardial effusion is circumferential.    Antibiotics: Anti-infectives (From admission, onward)   Start     Dose/Rate Route Frequency Ordered Stop   08/12/19 1000  remdesivir 100 mg in sodium chloride 0.9 % 250 mL IVPB     100 mg 500 mL/hr over 30 Minutes Intravenous Every 24 hours 08/11/19 1308 08/16/19 0959   08/11/19 1400  azithromycin (ZITHROMAX) 500 mg in sodium chloride 0.9 % 250 mL IVPB     500 mg 250 mL/hr over 60 Minutes Intravenous Every 24 hours 08/11/19 1242 08/16/19 1359   08/11/19 1400  remdesivir 200 mg in sodium chloride 0.9 % 250 mL IVPB     200 mg 500 mL/hr over 30 Minutes Intravenous Once 08/11/19 1308 08/11/19 1448   08/11/19 1300  cefTRIAXone (ROCEPHIN) 1 g in sodium chloride 0.9 % 100 mL IVPB     1 g 200 mL/hr over 30 Minutes Intravenous Every 24 hours 08/11/19 1242 08/16/19 1259  Subjective/Interval History: Patient noted to be somewhat lethargic this morning.  She is arousable.  She does answer a few questions but very distracted.  Denies any pain.    Assessment/Plan:  Acute Hypoxic Resp. Failure/Pneumonia due to  COVID-19  COVID-19 Labs  Recent Labs    08/12/19 0215 08/13/19 0000 08/14/19 0025  DDIMER 1.84* 2.43*  --   CRP 20.7* 17.1* 15.8*    Lab Results  Component Value Date   SARSCOV2NAA POSITIVE (A) 08/09/2019   SARSCOV2NAA NOT DETECTED 03/07/2019   [place] NEGATIVE 02/08/2019     Fever: Remains afebrile Oxygen requirements: Room air.  Saturating in the 90s. Antibacterials: Ceftriaxone and azithromycin Remdesivir: Day 4 Steroids: Not started on steroids Diuretics: On IV Lasix every 12 hours Actemra: Not given DVT Prophylaxis: None due to significantly elevated INR recently along with very low hemoglobin and suspicion of GI bleed  Patient seems to be stable from COVID-19/respiratory standpoint.  She is saturating normal on room air.  Due to elevated procalcitonin she was started on antibiotics as well.  She also remains on his Advair.  No steroids due to suspected GI bleed.  Not on anticoagulation for the same reason.  Inflammatory markers improved.  CRP is down to 15.8 from a peak of 29.4.  Symptomatic anemia/chronic blood loss anemia Presented with a hemoglobin of 3.9 on admission.  She had normal colored stool but was Hemoccult positive.  She did have significantly elevated INR.  Gastroenterology was consulted at [facility].  It was felt that she did not need EGD urgently due to her COVID-19 standpoint.  She has not had any overt bleeding.  She was transfused aggressively.  She was transfused 6 units of blood so far.  Hemoglobin has responded appropriately and remained stable.  No evidence of overt bleeding noted.  Suspected GI bleed Was thought to be slow GI bleed in the setting of coagulopathy with INR greater than 10.  However her stool color was normal.  Hemoccult was positive.  Patient was seen by gastroenterology at [facility], Dr. [name].  Due to COVID-19 status she did not undergo any endoscopy as it was thought to be not needed on an urgent basis.  Continue PPI.  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  Patient had been on warfarin rather than direct acting agents due to concern for valvular A. Fib.  Patient noted to have a moderate mitral stenosis on echocardiogram.  Her echocardiogram from last year showed LA thrombus.  She has had 2 episodes when her INR have gone greater than 10 in the last few months.  Holding warfarin due to significant anemia and suspicion for GI bleed.  Family is aware of risk of stroke.  Echocardiogram repeated here.  No mention of any clot in the left atrium.  Continues to show moderate mitral stenosis.  Trivial pericardial effusion was noted.  Discussed with Dr. Fletcher Anon as to plan for anticoagulation.  In view of the fact that she had life-threatening anemia and has not been able to have endoscopy, to see if she did have a source of bleeding or not, it is felt that this time that putting her on anticoagulation will increase her risk of rebleed significantly.  Recommendation is to hold off on anticoagulation.  Dr. Fletcher Anon will arrange follow-up in his clinic in 2 to 3 weeks.    Coagulopathy INR was greater than 10 secondary to warfarin.  She has had this previously as  well.  She received vitamin K x2.  INR has improved.  Anticoagulation on hold.  Acute on chronic diastolic CHF She was volume overloaded on examination.  Started on IV Lasix twice daily.  She has been diuresing well.  Weight measurement seem to be inaccurate.  Continue to monitor.  Supplement potassium.    Acute kidney injury on chronic kidney disease stage III Baseline creatinine around 2.  It was 2.89 on admission.  Creatinine improving on a daily basis.  Down to 2.15 today.  Continue to monitor urine output which has been quite adequate.     Diabetes mellitus type 2, uncontrolled with hyperglycemia On glimepiride at home.  Continue SSI.  HbA1c was 12.9 in 2019.  Noted to be 5.6 during this  hospitalization. .  Acute metabolic encephalopathy Likely due to acute illness as discussed above.  Noted to be lethargic today.  Easily arousable.  No obvious focal deficits noted.  Continue to monitor closely.  Vital signs stable.    Essential hypertension/bradycardia Blood pressure reasonably well controlled.  Lisinopril on hold.  Noted to be bradycardic this morning with occasional drops into the 40s.  We will hold her carvedilol for today.  Will decrease the dose.  Hyperlipidemia Continue with statin.  Pressure injury Pressure Injury 08/10/19 Sacrum Mid Stage II -  Partial thickness loss of dermis presenting as a shallow open ulcer with a red, pink wound bed without slough. sarum red (Active)  08/10/19 1300  Location: Sacrum  Location Orientation: Mid  Staging: Stage II -  Partial thickness loss of dermis presenting as a shallow open ulcer with a red, pink wound bed without slough.  Wound Description (Comments): sarum red  Present on Admission: Yes   Morbid obesity Estimated body mass index is 65.23 kg/m as calculated from the following:   Height as of this encounter: 5\' 4"  (1.626 m).   Weight as of this encounter: 172.4 kg.   ADDENDUM Discussed with daughter.  CODE STATUS was reviewed with her.  Patient has expressed a wish previously that she does not want life support and CPR etc.   Daughter will look over her paperwork to verify and confirm but she is agreeable to changing her CODE STATUS to DNR now. Will change CODE STATUS to DNR.  DVT Prophylaxis: SCDs PUD Prophylaxis: PPI Code Status: See above Family Communication: Daughter being updated on a daily basis Disposition Plan: Mobilize.  Continue treatments as outlined above.   Medications:  Scheduled: . acidophilus  1 capsule Oral BID  . atorvastatin  40 mg Oral QHS  . [START ON 08/15/2019] carvedilol  12.5 mg Oral BID WC  . docusate sodium  100 mg Oral BID  . feeding supplement  1 Container Oral TID BM  .  FLUoxetine  20 mg Oral Daily  . furosemide  40 mg Intravenous Q12H  . Gerhardt's butt cream   Topical TID  . hydrALAZINE  50 mg Oral QID  . insulin glargine  15 Units Subcutaneous QPM  . multivitamin with minerals  1 tablet Oral Daily  . nystatin cream   Topical TID  . pantoprazole  40 mg Oral BID AC  . rOPINIRole  0.25 mg Oral QHS  . sucralfate  1 g Oral TID WC & HS  . vitamin B-12  250 mcg Oral Daily   Continuous: . azithromycin 500 mg (08/13/19 1357)  . cefTRIAXone (ROCEPHIN)  IV 1 g (08/13/19 1206)  . remdesivir 100 mg in NS 250 mL 100 mg (08/14/19 0842)  CBS:[id number], hydrALAZINE   Objective:  Vital Signs  Vitals:   08/14/19 0726 08/14/19 0843 08/14/19 0930 08/14/19 1030  BP:  (!) 162/79  (!) 158/73  Pulse: 68 66 65 65  Resp: 17  20 19   Temp: 98.1 F (36.7 C)     TempSrc: Axillary     SpO2: 93%  98% 92%  Weight:      Height:        Intake/Output Summary (Last 24 hours) at 08/14/2019 1112 Last data filed at 08/14/2019 0606 Gross per 24 hour  Intake 700 ml  Output 2175 ml  Net -1475 ml   Filed Weights   08/12/19 0500 08/13/19 0431 08/14/19 0344  Weight: (!) 138.5 kg (!) 180.5 kg (!) 172.4 kg    General appearance: Lethargic.  Easily arousable.  Morbidly obese Resp: Coarse breath sounds bilaterally.  Crackles at the bases.  No wheezing or rhonchi. Cardio: S1-S2 is normal regular.  No S3-S4.  No rubs murmurs or bruit GI: Abdomen is soft.  Nontender nondistended.  Bowel sounds are present normal.  No masses organomegaly Extremities: No edema.  Full range of motion of lower extremities. Neurologic: Lethargic easily arousable.  Confused.  No obvious focal deficits.     Lab Results:  Data Reviewed: I have personally reviewed following labs and imaging studies  CBC: Recent Labs  Lab 08/10/19 1600 08/11/19 0210 08/12/19 0215 08/13/19 0000 08/14/19 0025  WBC 11.7* 9.6 8.7 9.6 10.1  NEUTROABS 10.2* 8.3* 7.3 7.9* 8.0*  HGB 7.3* 7.0* 8.4* 9.6* 10.1*   HCT 23.4* 22.1* 27.1* 30.2* 32.6*  MCV 92.1 91.7 90.6 89.3 90.8  PLT 294 265 268 302 599    Basic Metabolic Panel: Recent Labs  Lab 08/10/19 1600 08/11/19 0210 08/12/19 0215 08/13/19 0000 08/14/19 0025  NA 139 142 139 141 141  K 4.1 3.9 4.5 3.8 3.6  CL 99 100 98 99 99  CO2 27 28 28 28 30   GLUCOSE 98 105* 117* 150* 192*  BUN 74* 77* 75* 69* 67*  CREATININE 2.99* 2.92* 2.63* 2.38* 2.15*  CALCIUM 8.1* 7.9* 8.2* 8.2* 8.2*    GFR: Estimated Creatinine Clearance: 36.9 mL/min (A) (by C-G formula based on SCr of 2.15 mg/dL (H)).  Liver Function Tests: Recent Labs  Lab 08/10/19 1600 08/11/19 0210 08/12/19 0215 08/13/19 0000 08/14/19 0025  AST 30 22 24 22 23   ALT 22 15 17 15 15   ALKPHOS 76 66 71 69 66  BILITOT 1.5* 1.6* 0.9 1.3* 1.2  PROT 6.2* 5.4* 5.8* 5.6* 5.7*  ALBUMIN 2.3* 2.0* 2.0* 2.1* 2.0*     Coagulation Profile: Recent Labs  Lab 08/10/19 1600 08/11/19 0210 08/12/19 0215 08/13/19 0000 08/14/19 0025  INR 1.3* 1.3* 1.3* 1.5* 1.6*     CBG: Recent Labs  Lab 08/11/19 1650 08/12/19 1106 08/12/19 1737 08/12/19 2211 08/13/19 2130  GLUCAP 156* 150* 216* 171* 181*     Recent Results (from the past 240 hour(s))  Culture, blood (routine x 2)     Status: None   Collection Time: 08/08/19 10:06 PM   Specimen: BLOOD  Result Value Ref Range Status   Specimen Description BLOOD LEFT ANTECUBITAL  Final   Special Requests   Final    BOTTLES DRAWN AEROBIC AND ANAEROBIC Blood Culture adequate volume   Culture   Final    NO GROWTH 5 DAYS Performed at [facility], [address], [place], [place] [address]    Report Status 08/13/2019 FINAL  Final  Culture, blood (  routine x 2)     Status: None   Collection Time: 08/08/19 10:07 PM   Specimen: BLOOD RIGHT HAND  Result Value Ref Range Status   Specimen Description BLOOD RIGHT HAND  Final   Special Requests   Final    BOTTLES DRAWN AEROBIC AND ANAEROBIC Blood Culture adequate volume   Culture    Final    NO GROWTH 5 DAYS Performed at [facility], [address] [place], [place] [address]    Report Status 08/13/2019 FINAL  Final  SARS CORONAVIRUS 2 (TAT 6-24 HRS) Nasopharyngeal Nasopharyngeal Swab     Status: Abnormal   Collection Time: 08/09/19  1:20 AM   Specimen: Nasopharyngeal Swab  Result Value Ref Range Status   SARS Coronavirus 2 POSITIVE (A) NEGATIVE Final    Comment: RESULT CALLED TO, READ BACK BY AND VERIFIED WITH: ARIEL SMITH RN.@[email or web address]  ON 11.8.2020 BY TCALDWELL MT. (NOTE) SARS-CoV-2 target nucleic acids are DETECTED. The SARS-CoV-2 RNA is generally detectable in upper and lower respiratory specimens during the acute phase of infection. Positive results are indicative of active infection with SARS-CoV-2. Clinical  correlation with patient history and other diagnostic information is necessary to determine patient infection status. Positive results do  not rule out bacterial infection or co-infection with other viruses. The expected result is Negative. Fact Sheet for Patients: SugarRoll.be Fact Sheet for Healthcare Providers: https://www.woods-mathews.com/ This test is not yet approved or cleared by the [place] FDA and  has been authorized for detection and/or diagnosis of SARS-CoV-2 by FDA under an Emergency Use Authorization (EUA). This EUA will remain  in effect (meaning this test can  be used) for the duration of the COVID-19 declaration under Section 564(b)(1) of the Act, 21 U.S.C. section 360bbb-3(b)(1), unless the authorization is terminated or revoked sooner. Performed at [facility], [place] [address] [place], [place] [address]       Radiology Studies: No results found.     LOS: 4 days   Morrie Daywalt [employer] on [email or web address]  08/14/2019, 11:12 AM

## 2019-08-14 NOTE — Consult Note (Signed)
   [facility] CM Inpatient Consult   08/14/2019  STEPANIE GRAVER 1946-07-06 [id number]    Patientreviewed for less than 7 days readmission with 22% high risk score for unplanned readmission and hospitalization; and to check for potential [facility] care management services needed under her [place] [employer] benefit.  Patient had been outreached/ engaged by [facility] care management coordinators (SW, pharmacy, pharmacy tech, Health Coach, RN CM) in the past. Our community based plan of care has focused on disease management and community resource support.  Chart review andMD brief history/ summary on 08/13/19 revealas follows:  [name] a37 y.o.female,with obesity, CHF, hypertension, dyslipidemia, GERD, stage III chronic kidney disease, CVA, chronic A. fib/LV thrombuson warfarin, who is long-term resident at [facility], was sent to ED for generalized weakness, altered mental status,and being pale, work-up significant for hemoglobin of 3.9, baseline around 8, she is Hemoccult positive but no frank melena or hematochezia, transfused 4 units PRBC with good response, hemoglobin is 7, seen by GI at [facility], plan was for endoscopy, but her COVID-19 was positive, so she was transferred to [facility] for further management. (Severe anemia, COVID-19)  Primary care providerisDr.David Bronstein with [facility].  PT/ OT evaluation notes completedshowingthat patient is recommendedtoreturn back to SNF (skilled nursing facility)upon discharge.  Per medical record review, patient is a long-term resident at [facility].  If there are changes in disposition and needs for post- hospital follow-up as appropriate, please refer to[facility] Care Management.  Otherwise, patient's care will be met at the skilled level of care.    For questions and referral, please call:  Edwena Felty A. Shaughnessy Gethers, BSN, RN-BC [facility] Liaison Cell: [phone]

## 2019-08-14 NOTE — Progress Notes (Addendum)
0730: Assumed care of Pt from night RN. Pt denies pain, SOB, BP high but other VS WNL.  Full bed change, peri care performed, and repositioned. Call bell within reach  1030: Tele reported that HR "frequently dropping into low 30's." Pt assessed, HR afib 67, BP 158/73 SpO2 95 on room air. Pt is more lethargic/sleepy and withdrawn today. MD made aware of assessment and vitals and scheduled carvedilol was given this AM.  Will monitor  1130: HR 66, Pt sleeping deeply when enter room. Reassessed, little more agitated today, "why can't you just leave me alone." BP, SpO2 stable. Cleaned, repositioned, call bell within reach  1400: Pt sleeping, will monitor  1600: Pt reassessed, remains confused, sleepy. VS within same parameters. Cleaned, repositioned, and updates given to daughter over phone. Will monitor  1805: Cleaned, repositioned, Pt eating more than typical for her of dinner tray. Call bell within reach

## 2019-08-14 NOTE — Progress Notes (Signed)
1500: purple DNR band placed on Pt per order.

## 2019-08-15 LAB — CBC WITH DIFFERENTIAL/PLATELET
Abs Immature Granulocytes: 0.09 10*3/uL — ABNORMAL HIGH (ref 0.00–0.07)
Basophils Absolute: 0 10*3/uL (ref 0.0–0.1)
Basophils Relative: 0 %
Eosinophils Absolute: 0.5 10*3/uL (ref 0.0–0.5)
Eosinophils Relative: 5 %
HCT: 32.1 % — ABNORMAL LOW (ref 36.0–46.0)
Hemoglobin: 10 g/dL — ABNORMAL LOW (ref 12.0–15.0)
Immature Granulocytes: 1 %
Lymphocytes Relative: 8 %
Lymphs Abs: 0.9 10*3/uL (ref 0.7–4.0)
MCH: 28.3 pg (ref 26.0–34.0)
MCHC: 31.2 g/dL (ref 30.0–36.0)
MCV: 90.9 fL (ref 80.0–100.0)
Monocytes Absolute: 0.9 10*3/uL (ref 0.1–1.0)
Monocytes Relative: 9 %
Neutro Abs: 7.9 10*3/uL — ABNORMAL HIGH (ref 1.7–7.7)
Neutrophils Relative %: 77 %
Platelets: 314 10*3/uL (ref 150–400)
RBC: 3.53 MIL/uL — ABNORMAL LOW (ref 3.87–5.11)
RDW: 15.4 % (ref 11.5–15.5)
WBC: 10.3 10*3/uL (ref 4.0–10.5)
nRBC: 0 % (ref 0.0–0.2)

## 2019-08-15 LAB — COMPREHENSIVE METABOLIC PANEL
ALT: 15 U/L (ref 0–44)
AST: 22 U/L (ref 15–41)
Albumin: 2 g/dL — ABNORMAL LOW (ref 3.5–5.0)
Alkaline Phosphatase: 73 U/L (ref 38–126)
Anion gap: 11 (ref 5–15)
BUN: 63 mg/dL — ABNORMAL HIGH (ref 8–23)
CO2: 30 mmol/L (ref 22–32)
Calcium: 8.4 mg/dL — ABNORMAL LOW (ref 8.9–10.3)
Chloride: 103 mmol/L (ref 98–111)
Creatinine, Ser: 1.93 mg/dL — ABNORMAL HIGH (ref 0.44–1.00)
GFR calc Af Amer: 29 mL/min — ABNORMAL LOW (ref >60)
GFR calc non Af Amer: 25 mL/min — ABNORMAL LOW (ref >60)
Glucose, Bld: 255 mg/dL — ABNORMAL HIGH (ref 70–99)
Potassium: 3.6 mmol/L (ref 3.5–5.1)
Sodium: 144 mmol/L (ref 135–145)
Total Bilirubin: 0.9 mg/dL (ref 0.3–1.2)
Total Protein: 5.5 g/dL — ABNORMAL LOW (ref 6.5–8.1)

## 2019-08-15 LAB — PROTIME-INR
INR: 1.7 — ABNORMAL HIGH (ref 0.8–1.2)
Prothrombin Time: 20.1 seconds — ABNORMAL HIGH (ref 11.4–15.2)

## 2019-08-15 LAB — GLUCOSE, CAPILLARY
Glucose-Capillary: 194 mg/dL — ABNORMAL HIGH (ref 70–99)
Glucose-Capillary: 178 mg/dL — ABNORMAL HIGH (ref 70–99)

## 2019-08-15 LAB — C-REACTIVE PROTEIN: CRP: 12.8 mg/dL — ABNORMAL HIGH (ref <1.0)

## 2019-08-15 MED ORDER — CHLORHEXIDINE GLUCONATE CLOTH 2 % EX PADS
6.0000 | MEDICATED_PAD | Freq: Every day | CUTANEOUS | Status: DC
  Administered 2019-08-15 – 2019-08-17 (×3): 6 via TOPICAL

## 2019-08-15 MED ORDER — INSULIN ASPART 100 UNIT/ML ~~LOC~~ SOLN
0.0000 [IU] | Freq: Three times a day (TID) | SUBCUTANEOUS | Status: DC
  Administered 2019-08-15 (×2): 3 [IU] via SUBCUTANEOUS
  Administered 2019-08-16 (×3): 2 [IU] via SUBCUTANEOUS
  Administered 2019-08-17: 3 [IU] via SUBCUTANEOUS

## 2019-08-15 MED ORDER — HYDRALAZINE HCL 50 MG PO TABS
100.0000 mg | ORAL_TABLET | Freq: Three times a day (TID) | ORAL | Status: DC
  Administered 2019-08-15 – 2019-08-17 (×7): 100 mg via ORAL
  Filled 2019-08-15 (×5): qty 2

## 2019-08-15 MED ORDER — POTASSIUM CHLORIDE CRYS ER 20 MEQ PO TBCR
40.0000 meq | EXTENDED_RELEASE_TABLET | Freq: Once | ORAL | Status: AC
  Administered 2019-08-15: 40 meq via ORAL
  Filled 2019-08-15: qty 2

## 2019-08-15 NOTE — Progress Notes (Signed)
PROGRESS NOTE  Megan Obrien HYW:[id number] DOB: 1946/02/21 DOA: 08/10/2019  PCP: Megan Pitch, MD  Brief History/Interval Summary: [name] a64 y.o.female,with obesity, CHF, hypertension, dyslipidemia, GERD, stage III chronic kidney disease, CVA, chronic A. fib/LV thrombus on warfarin, who is long-term resident at [facility], she was sent to ED for generalized weakness, altered mental status, and being pale, work-up significant for hemoglobin of 3.9, baseline around 8, she is Hemoccult positive but no frank melena or hematochezia, transfused 4 units PRBC with good response, hemoglobin is 7, seen by GI at [facility], plan was for endoscopy, but her COVID-19 was positive, so she was transferred to [facility] for further management.   Reason for Visit: Severe anemia.  COVID-19.  Consultants:  Gastroenterology at [facility], Dr. Alice Obrien Phone discussion with her cardiologist Dr. Fletcher Obrien  Procedures:   Transthoracic echocardiogram 11/12  1. There is moderate mitral stenosis due to severe mitral annular calcification. The mean gradient is 7.5 mmHg with heart rate of 63 bpm. The MVA by continuity is 1.50 cm2. When compared with the most recent study 12/18/2017, there is no significant  change in the mitral valve gradients. The rhythm is noted to be NSR with frequent PACs.  2. The tricuspid regurgitant velocity is 2.90 m/s, and with an assumed right atrial pressure of 15 mmHg, the estimated right ventricular systolic pressure is mildly elevated at 48.6 mmHg.  3. PA pressure estimated ~48/27 mmHg. mPAP ~ 34 mmHg which is mild pulmonary HTN.  4. Left ventricular ejection fraction, by visual estimation, is 60 to 65%. The left ventricle has normal function. There is mildly increased left ventricular hypertrophy.  5. Global right ventricle has normal systolic function.The right ventricular size is normal. No increase in right ventricular wall thickness.  6. Left atrial  size was moderately dilated.  7. Right atrial size was normal.  8. Severe mitral annular calcification.  9. The mitral valve is degenerative. Mild mitral valve regurgitation. Moderate mitral stenosis. 10. The tricuspid valve is grossly normal. Tricuspid valve regurgitation is trivial. 11. The aortic valve is tricuspid. Aortic valve regurgitation is not visualized. Mild to moderate aortic valve sclerosis/calcification without any evidence of aortic stenosis. 12. The pulmonic valve was grossly normal. Pulmonic valve regurgitation is mild. 13. Mildly elevated pulmonary artery systolic pressure. 14. The left ventricle has no regional wall motion abnormalities. 15. The inferior vena cava is dilated in size with <50% respiratory variability, suggesting right atrial pressure of 15 mmHg. 16. Trivial pericardial effusion is present. 17. The pericardial effusion is circumferential.    Antibiotics: Anti-infectives (From admission, onward)   Start     Dose/Rate Route Frequency Ordered Stop   08/12/19 1000  remdesivir 100 mg in sodium chloride 0.9 % 250 mL IVPB     100 mg 500 mL/hr over 30 Minutes Intravenous Every 24 hours 08/11/19 1308 08/15/19 1021   08/11/19 1400  azithromycin (ZITHROMAX) 500 mg in sodium chloride 0.9 % 250 mL IVPB     500 mg 250 mL/hr over 60 Minutes Intravenous Every 24 hours 08/11/19 1242 08/16/19 1359   08/11/19 1400  remdesivir 200 mg in sodium chloride 0.9 % 250 mL IVPB     200 mg 500 mL/hr over 30 Minutes Intravenous Once 08/11/19 1308 08/11/19 1448   08/11/19 1300  cefTRIAXone (ROCEPHIN) 1 g in sodium chloride 0.9 % 100 mL IVPB     1 g 200 mL/hr over 30 Minutes Intravenous Every 24 hours 08/11/19 1242 08/16/19 1259  Subjective/Interval History: Patient noted to be awake and alert this morning.  She does not remember the events of yesterday when she was very lethargic.  Denies any shortness of breath.  Still noted to be distracted and mildly confused.       Assessment/Plan:  Acute Hypoxic Resp. Failure/Pneumonia due to COVID-19  COVID-19 Labs  Recent Labs    08/13/19 0000 08/14/19 0025 08/15/19 0119  DDIMER 2.43*  --   --   CRP 17.1* 15.8* 12.8*    Lab Results  Component Value Date   SARSCOV2NAA POSITIVE (A) 08/09/2019   SARSCOV2NAA NOT DETECTED 03/07/2019   [name] NEGATIVE 02/08/2019     Fever: Remains afebrile Oxygen requirements: Room air.  Saturating in the 90s. Antibacterials: Ceftriaxone and azithromycin, day 5 today Remdesivir: Day 5 Steroids: Not started on steroids Diuretics: On IV Lasix every 12 hours Actemra: Not given DVT Prophylaxis: None due to significantly elevated INR recently along with very low hemoglobin and suspicion of GI bleed  Patient seems to be stable from a COVID-19/respiratory standpoint.  She is saturating normal on room air.  Due to elevated procalcitonin she was started on antibiotics as well.  She will complete course of antibiotics and remdesivir today.  Due to suspected GI bleed she was not placed on systemic steroids.  Not on anticoagulation for the same reason.  Inflammatory markers have been improving.  CRP peaked at 29.4 and is down to 12.8 today.  Symptomatic anemia/chronic blood loss anemia Presented with a hemoglobin of 3.9 on admission.  She had normal colored stool but was Hemoccult positive.  She did have significantly elevated INR.  Gastroenterology was consulted at [facility].  It was felt that she did not need EGD urgently due to her COVID-19 standpoint.  She has not had any overt bleeding.  Total of 6 units of PRBC.  Hemoglobin responded appropriately and has been stable for the last several days.    Suspected GI bleed Was thought to be slow GI bleed in the setting of coagulopathy with INR greater than 10.  However her stool color was normal.  Hemoccult was positive.  Patient was seen by gastroenterology at [facility], Dr. Alice Obrien.  Due to COVID-19 status she did not undergo any  endoscopy as it was thought to be not needed on an urgent basis.  Continue PPI.  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  Patient had been on warfarin rather than direct acting agents due to concern for valvular A. Fib.  Patient noted to have a moderate mitral stenosis on echocardiogram.  Her echocardiogram from last year showed LA thrombus.  She has had 2 episodes when her INR have gone greater than 10 in the last few months.  Holding warfarin due to significant anemia and suspicion for GI bleed.  Family is aware of risk of stroke.  Echocardiogram repeated here.  No mention of any clot in the left atrium.  Continues to show moderate mitral stenosis.  Trivial pericardial effusion was noted.  Discussed with Dr. Fletcher Obrien as to plan for anticoagulation.  In view of the fact that she had life-threatening anemia and has not been able to have endoscopy, to see if she did have a source of bleeding or not, it is felt that this time that putting her on anticoagulation will increase her risk of rebleed significantly.  Recommendation is to hold off on anticoagulation.  Dr. Fletcher Obrien will arrange follow-up in his clinic in 2  to 3 weeks.    Coagulopathy INR was greater than 10 secondary to warfarin.  She has had this previously as well.  She received vitamin K x2.  INR has improved.  Anticoagulation on hold.  Acute on chronic diastolic CHF Volume status appears to be improving.  She is diuresing well on current regiment of diuretics.  Continue to monitor volume status daily.  Ins and outs and daily weights.  Electrolytes to be repleted.     Acute kidney injury on chronic kidney disease stage III Baseline creatinine around 2.  It was 2.89 on admission.  Creatinine improving on a daily basis.  Has good urine output.  Creatinine down to 1.93 today.    Diabetes mellitus type 2, uncontrolled with hyperglycemia On glimepiride at home.   CBGs are reasonably well controlled.  Continue SSI.  She is also on Lantus.  HbA1c was 12.9 in 2019.  Noted to be 5.6 during this hospitalization. .  Acute metabolic encephalopathy Likely due to acute illness as discussed above.  Remains distracted but no obvious focal neurological deficits.  Mentation is better today compared to yesterday.  Vital signs are stable.    Essential hypertension/bradycardia Lisinopril on hold.  Was noted to be bradycardic yesterday with occasional drop in heart rate to 40s.  Dose of carvedilol has been decreased.  Blood pressure noted to be elevated.  May need to add additional agents.  Hyperlipidemia Continue with statin.  Pressure injury Pressure Injury 08/10/19 Sacrum Mid Stage II -  Partial thickness loss of dermis presenting as a shallow open ulcer with a red, pink wound bed without slough. sarum red (Active)  08/10/19 1300  Location: Sacrum  Location Orientation: Mid  Staging: Stage II -  Partial thickness loss of dermis presenting as a shallow open ulcer with a red, pink wound bed without slough.  Wound Description (Comments): sarum red  Present on Admission: Yes   Morbid obesity Estimated body mass index is 65.09 kg/m as calculated from the following:   Height as of this encounter: 5\' 4"  (1.626 m).   Weight as of this encounter: 172 kg.   Goals of care Discussed with daughter.  CODE STATUS was reviewed with her.  Patient has expressed a wish previously that she does not want life support and CPR etc.   patient was changed to DNR status.  DVT Prophylaxis: SCDs PUD Prophylaxis: PPI Code Status: DNR Family Communication: Daughter being updated on a daily basis Disposition Plan: Mobilize.  Continue treatments as outlined above.   Medications:  Scheduled: . acidophilus  1 capsule Oral BID  . atorvastatin  40 mg Oral QHS  . carvedilol  12.5 mg Oral BID WC  . Chlorhexidine Gluconate Cloth  6 each Topical Daily  . feeding supplement  1  Container Oral TID BM  . FLUoxetine  20 mg Oral Daily  . furosemide  40 mg Intravenous Q12H  . Gerhardt's butt cream   Topical TID  . hydrALAZINE  50 mg Oral QID  . insulin glargine  15 Units Subcutaneous QPM  . multivitamin with minerals  1 tablet Oral Daily  . nystatin cream   Topical TID  . pantoprazole  40 mg Oral BID AC  . rOPINIRole  0.25 mg Oral QHS  . sucralfate  1 g Oral TID WC & HS  . vitamin B-12  250 mcg Oral Daily   Continuous: . azithromycin 500 mg (08/14/19 1343)  . cefTRIAXone (ROCEPHIN)  IV 1 g (08/14/19 1203)   HYI:FOYDXAJOINOMV,  hydrALAZINE   Objective:  Vital Signs  Vitals:   08/14/19 2037 08/15/19 0027 08/15/19 0504 08/15/19 0900  BP: (!) 161/61 (!) 186/75 (!) 169/61 (!) 158/68  Pulse: 67 67 74   Resp: 18 20 18    Temp: 97.8 F (36.6 C) 98.4 F (36.9 C) 98.2 F (36.8 C) 98 F (36.7 C)  TempSrc: Oral Oral Oral Oral  SpO2: 96% 97% 95%   Weight:   (!) 172 kg   Height:        Intake/Output Summary (Last 24 hours) at 08/15/2019 1036 Last data filed at 08/15/2019 8016 Gross per 24 hour  Intake 1630 ml  Output 1875 ml  Net -245 ml   Filed Weights   08/13/19 0431 08/14/19 0344 08/15/19 0504  Weight: (!) 180.5 kg (!) 172.4 kg (!) 172 kg    General appearance: More awake and alert today.  Distracted.  In no distress.  Morbidly obese. Resp: Coarse breath sounds bilaterally.  Crackles at the bases.  No wheezing or rhonchi. Cardio: S1-S2 is normal regular.  No S3-S4.  No rubs murmurs or bruit GI: Abdomen is soft.  Nontender nondistended.  Bowel sounds are present normal.  No masses organomegaly Extremities: No edema.  Legs covered in dressing. Neurologic: Disoriented.  No obvious focal neurological deficits.   Lab Results:  Data Reviewed: I have personally reviewed following labs and imaging studies  CBC: Recent Labs  Lab 08/11/19 0210 08/12/19 0215 08/13/19 0000 08/14/19 0025 08/15/19 0119  WBC 9.6 8.7 9.6 10.1 10.3  NEUTROABS 8.3* 7.3  7.9* 8.0* 7.9*  HGB 7.0* 8.4* 9.6* 10.1* 10.0*  HCT 22.1* 27.1* 30.2* 32.6* 32.1*  MCV 91.7 90.6 89.3 90.8 90.9  PLT 265 268 302 329 553    Basic Metabolic Panel: Recent Labs  Lab 08/11/19 0210 08/12/19 0215 08/13/19 0000 08/14/19 0025 08/15/19 0119  NA 142 139 141 141 144  K 3.9 4.5 3.8 3.6 3.6  CL 100 98 99 99 103  CO2 28 28 28 30 30   GLUCOSE 105* 117* 150* 192* 255*  BUN 77* 75* 69* 67* 63*  CREATININE 2.92* 2.63* 2.38* 2.15* 1.93*  CALCIUM 7.9* 8.2* 8.2* 8.2* 8.4*    GFR: Estimated Creatinine Clearance: 41 mL/min (A) (by C-G formula based on SCr of 1.93 mg/dL (H)).  Liver Function Tests: Recent Labs  Lab 08/11/19 0210 08/12/19 0215 08/13/19 0000 08/14/19 0025 08/15/19 0119  AST 22 24 22 23 22   ALT 15 17 15 15 15   ALKPHOS 66 71 69 66 73  BILITOT 1.6* 0.9 1.3* 1.2 0.9  PROT 5.4* 5.8* 5.6* 5.7* 5.5*  ALBUMIN 2.0* 2.0* 2.1* 2.0* 2.0*     Coagulation Profile: Recent Labs  Lab 08/11/19 0210 08/12/19 0215 08/13/19 0000 08/14/19 0025 08/15/19 0119  INR 1.3* 1.3* 1.5* 1.6* 1.7*     CBG: Recent Labs  Lab 08/11/19 1650 08/12/19 1106 08/12/19 1737 08/12/19 2211 08/13/19 2130  GLUCAP 156* 150* 216* 171* 181*     Recent Results (from the past 240 hour(s))  Culture, blood (routine x 2)     Status: None   Collection Time: 08/08/19 10:06 PM   Specimen: BLOOD  Result Value Ref Range Status   Specimen Description BLOOD LEFT ANTECUBITAL  Final   Special Requests   Final    BOTTLES DRAWN AEROBIC AND ANAEROBIC Blood Culture adequate volume   Culture   Final    NO GROWTH 5 DAYS Performed at [facility], [address] [place], [place] [address]  Report Status 08/13/2019 FINAL  Final  Culture, blood (routine x 2)     Status: None   Collection Time: 08/08/19 10:07 PM   Specimen: BLOOD RIGHT HAND  Result Value Ref Range Status   Specimen Description BLOOD RIGHT HAND  Final   Special Requests   Final    BOTTLES DRAWN AEROBIC AND ANAEROBIC  Blood Culture adequate volume   Culture   Final    NO GROWTH 5 DAYS Performed at [facility], [address] [place], [place] [address]    Report Status 08/13/2019 FINAL  Final  SARS CORONAVIRUS 2 (TAT 6-24 HRS) Nasopharyngeal Nasopharyngeal Swab     Status: Abnormal   Collection Time: 08/09/19  1:20 AM   Specimen: Nasopharyngeal Swab  Result Value Ref Range Status   SARS Coronavirus 2 POSITIVE (A) NEGATIVE Final    Comment: RESULT CALLED TO, READ BACK BY AND VERIFIED WITH: ARIEL SMITH [email or web address]  ON 11.8.2020 BY TCALDWELL MT. (NOTE) SARS-CoV-2 target nucleic acids are DETECTED. The SARS-CoV-2 RNA is generally detectable in upper and lower respiratory specimens during the acute phase of infection. Positive results are indicative of active infection with SARS-CoV-2. Clinical  correlation with patient history and other diagnostic information is necessary to determine patient infection status. Positive results do  not rule out bacterial infection or co-infection with other viruses. The expected result is Negative. Fact Sheet for Patients: SugarRoll.be Fact Sheet for Healthcare Providers: https://www.woods-mathews.com/ This test is not yet approved or cleared by the [place] FDA and  has been authorized for detection and/or diagnosis of SARS-CoV-2 by FDA under an Emergency Use Authorization (EUA). This EUA will remain  in effect (meaning this test can  be used) for the duration of the COVID-19 declaration under Section 564(b)(1) of the Act, 21 U.S.C. section 360bbb-3(b)(1), unless the authorization is terminated or revoked sooner. Performed at [facility], [place] [address], [place], [place] [address]       Radiology Studies: No results found.     LOS: 5 days   Joe Gee [employer] on [email or web address]  08/15/2019, 10:36 AM

## 2019-08-16 LAB — CBC
HCT: 33.8 % — ABNORMAL LOW (ref 36.0–46.0)
Hemoglobin: 10.5 g/dL — ABNORMAL LOW (ref 12.0–15.0)
MCH: 28.5 pg (ref 26.0–34.0)
MCHC: 31.1 g/dL (ref 30.0–36.0)
MCV: 91.8 fL (ref 80.0–100.0)
Platelets: 314 10*3/uL (ref 150–400)
RBC: 3.68 MIL/uL — ABNORMAL LOW (ref 3.87–5.11)
RDW: 15.4 % (ref 11.5–15.5)
WBC: 10.6 10*3/uL — ABNORMAL HIGH (ref 4.0–10.5)
nRBC: 0 % (ref 0.0–0.2)

## 2019-08-16 LAB — BASIC METABOLIC PANEL
Anion gap: 11 (ref 5–15)
BUN: 55 mg/dL — ABNORMAL HIGH (ref 8–23)
CO2: 31 mmol/L (ref 22–32)
Calcium: 8.5 mg/dL — ABNORMAL LOW (ref 8.9–10.3)
Chloride: 102 mmol/L (ref 98–111)
Creatinine, Ser: 1.81 mg/dL — ABNORMAL HIGH (ref 0.44–1.00)
GFR calc Af Amer: 31 mL/min — ABNORMAL LOW (ref >60)
GFR calc non Af Amer: 27 mL/min — ABNORMAL LOW (ref >60)
Glucose, Bld: 87 mg/dL (ref 70–99)
Potassium: 3.9 mmol/L (ref 3.5–5.1)
Sodium: 144 mmol/L (ref 135–145)

## 2019-08-16 LAB — MAGNESIUM: Magnesium: 1.6 mg/dL — ABNORMAL LOW (ref 1.7–2.4)

## 2019-08-16 LAB — GLUCOSE, CAPILLARY: Glucose-Capillary: 128 mg/dL — ABNORMAL HIGH (ref 70–99)

## 2019-08-16 LAB — PROTIME-INR
INR: 1.9 — ABNORMAL HIGH (ref 0.8–1.2)
Prothrombin Time: 21.4 seconds — ABNORMAL HIGH (ref 11.4–15.2)

## 2019-08-16 MED ORDER — FUROSEMIDE 20 MG PO TABS
60.0000 mg | ORAL_TABLET | Freq: Every day | ORAL | Status: DC
  Administered 2019-08-17: 60 mg via ORAL
  Filled 2019-08-16: qty 3

## 2019-08-16 MED ORDER — MAGNESIUM SULFATE 2 GM/50ML IV SOLN
2.0000 g | Freq: Once | INTRAVENOUS | Status: AC
  Administered 2019-08-16: 2 g via INTRAVENOUS
  Filled 2019-08-16: qty 50

## 2019-08-16 NOTE — Progress Notes (Signed)
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   Reason for Visit: Severe anemia.  COVID-19.  Consultants:  Gastroenterology at [facility], Dr. [name] Phone discussion with her cardiologist Dr. Fletcher Anon  Procedures:   Transthoracic echocardiogram 11/12  1. There is moderate mitral stenosis due to severe mitral annular calcification. The mean gradient is 7.5 mmHg with heart rate of 63 bpm. The MVA by continuity is 1.50 cm2. When compared with the most recent study 12/18/2017, there is no significant  change in the mitral valve gradients. The rhythm is noted to be NSR with frequent PACs.  2. The tricuspid regurgitant velocity is 2.90 m/s, and with an assumed right atrial pressure of 15 mmHg, the estimated right ventricular systolic pressure is mildly elevated at 48.6 mmHg.  3. PA pressure estimated ~48/27 mmHg. mPAP ~ 34 mmHg which is mild pulmonary HTN.  4. Left ventricular ejection fraction, by visual estimation, is 60 to 65%. The left ventricle has normal function. There is mildly increased left ventricular hypertrophy.  5. Global right ventricle has normal systolic function.The right ventricular size is normal. No increase in right ventricular wall thickness.  6. Left atrial  size was moderately dilated.  7. Right atrial size was normal.  8. Severe mitral annular calcification.  9. The mitral valve is degenerative. Mild mitral valve regurgitation. Moderate mitral stenosis. 10. The tricuspid valve is grossly normal. Tricuspid valve regurgitation is trivial. 11. The aortic valve is tricuspid. Aortic valve regurgitation is not visualized. Mild to moderate aortic valve sclerosis/calcification without any evidence of aortic stenosis. 12. The pulmonic valve was grossly normal. Pulmonic valve regurgitation is mild. 13. Mildly elevated pulmonary artery systolic pressure. 14. The left ventricle has no regional wall motion abnormalities. 15. The inferior vena cava is dilated in size with <50% respiratory variability, suggesting right atrial pressure of 15 mmHg. 16. Trivial pericardial effusion is present. 17. The pericardial effusion is circumferential.    Antibiotics: Anti-infectives (From admission, onward)   Start     Dose/Rate Route Frequency Ordered Stop   08/12/19 1000  remdesivir 100 mg in sodium chloride 0.9 % 250 mL IVPB     100 mg 500 mL/hr over 30 Minutes Intravenous Every 24 hours 08/11/19 1308 08/15/19 1100   08/11/19 1400  azithromycin (ZITHROMAX) 500 mg in sodium chloride 0.9 % 250 mL IVPB     500 mg 250 mL/hr over 60 Minutes Intravenous Every 24 hours 08/11/19 1242 08/15/19 1705   08/11/19 1400  remdesivir 200 mg in sodium chloride 0.9 % 250 mL IVPB     200 mg 500 mL/hr over 30 Minutes Intravenous Once 08/11/19 1308 08/11/19 1448   08/11/19 1300  cefTRIAXone (ROCEPHIN) 1 g in sodium chloride 0.9 % 100 mL IVPB     1 g 200 mL/hr over 30 Minutes Intravenous Every 24 hours 08/11/19 1242 08/15/19 1545  Subjective/Interval History: Patient awake alert.  Remains distracted.  Denies any shortness of breath or chest pain.  No nausea vomiting.  No overnight events per nursing staff.       Assessment/Plan:  Acute Hypoxic Resp. Failure/Pneumonia due  to COVID-19  COVID-19 Labs  Recent Labs    08/14/19 0025 08/15/19 0119  CRP 15.8* 12.8*    Lab Results  Component Value Date   SARSCOV2NAA POSITIVE (A) 08/09/2019   SARSCOV2NAA NOT DETECTED 03/07/2019   [place] NEGATIVE 02/08/2019     Fever: Afebrile Oxygen requirements: Room air.  Saturating in the 90s. Antibacterials: Completed course of ceftriaxone and azithromycin on 11/14 Remdesivir: Completed 5-day course on 11/14 Steroids: Not started on steroids Diuretics: On IV Lasix every 12 hours. Will change to oral today. Actemra: Not given DVT Prophylaxis: None due to significantly elevated INR recently along with very low hemoglobin and suspicion of GI bleed  Seems to be stable from a COVID-19 standpoint.  Acute respiratory failure with hypoxia has resolved.  She is saturating normal on room air.  She is completed course of antibacterials and remdesivir.  Due to suspected GI bleed she was not placed on steroids.  No anticoagulation.  Inflammatory markers have been improving.  CRP went from 29 to 12.8.   Symptomatic anemia/chronic blood loss anemia Presented with a hemoglobin of 3.9 on admission.  She had normal colored stool but was Hemoccult positive.  She did have significantly elevated INR.  Gastroenterology was consulted at [facility].  It was felt that she did not need EGD urgently due to her COVID-19 standpoint.  She has not had any overt bleeding.  Total of 6 units of PRBC were transfused.  Hemoglobin has responded appropriately and stable for the last several days.  Suspected GI bleed Was thought to be slow GI bleed in the setting of coagulopathy with INR greater than 10.  However her stool color was normal.  Hemoccult was positive.  Patient was seen by gastroenterology at [facility], Dr. [name].  Due to COVID-19 status she did not undergo any endoscopy as it was thought to be not needed on an urgent basis.  Continue PPI.  She will need to be seen by gastroenterology when she is over  her acute illness in the next few weeks.  Might need to undergo endoscopy on an elective basis at that time.  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  Patient had been on warfarin rather than direct acting agents due to concern for valvular A. Fib.  Patient noted to have a moderate mitral stenosis on echocardiogram.  Her echocardiogram from last year showed LA thrombus.  She has had 2 episodes when her INR have gone greater than 10 in the last few months.  Holding warfarin due to significant anemia and suspicion for GI bleed.  Family is aware of risk of stroke.  Echocardiogram repeated here.  No mention of any clot in the left atrium.  Continues to show moderate mitral stenosis.  Trivial pericardial effusion was noted.  Discussed with Dr. Fletcher Anon as to plan for anticoagulation.  In view of the fact that she had life-threatening anemia and has not been able to have endoscopy, to see if she did have a source of bleeding or not, it is felt that this time that putting her on anticoagulation will increase her risk of rebleed significantly.  Recommendation is to hold off on anticoagulation.  Dr. Fletcher Anon will arrange follow-up in  his clinic in 2 to 3 weeks.    Coagulopathy INR was greater than 10 secondary to warfarin.  She has had this previously as well.  She received vitamin K x2.  INR has improved.  Anticoagulation on hold for now.  Acute on chronic diastolic CHF Placed on intravenous Lasix.  Volume status has improved.  She has diuresed well.  Weight has improved.  Replace magnesium.  Change to oral Lasix.  May discontinue Foley catheter.  Acute kidney injury on chronic kidney disease stage III Baseline creatinine around 2.  It was 2.89 on admission.  Creatinine improving on a daily basis.  Has good urine output.  Creatinine down to 1.81.  Good urine output.  Diabetes mellitus type 2, uncontrolled with  hyperglycemia On glimepiride at home.  CBGs are reasonably well controlled.  Continue SSI.  She is also on Lantus.  HbA1c was 12.9 in 2019.  Noted to be 5.6 during this hospitalization. .  Acute metabolic encephalopathy Likely due to acute illness as discussed above.  Remains distracted but no obvious focal neurological deficits.  She remained stable.  Essential hypertension/bradycardia Lisinopril on hold.  Was noted to be bradycardic with occasional drop in heart rate to 40s.  Dose of carvedilol has been decreased.  Blood pressure also noted to be reasonably well controlled.  Continue to monitor.  Hyperlipidemia Continue with statin.  Pressure injury Pressure Injury 08/10/19 Sacrum Mid Stage II -  Partial thickness loss of dermis presenting as a shallow open ulcer with a red, pink wound bed without slough. sarum red (Active)  08/10/19 1300  Location: Sacrum  Location Orientation: Mid  Staging: Stage II -  Partial thickness loss of dermis presenting as a shallow open ulcer with a red, pink wound bed without slough.  Wound Description (Comments): sarum red  Present on Admission: Yes   Morbid obesity Estimated body mass index is 65.09 kg/m as calculated from the following:   Height as of this encounter: 5\' 4"  (1.626 m).   Weight as of this encounter: 172 kg.   Goals of care Discussed with daughter.  CODE STATUS was reviewed with her.  Patient has expressed a wish previously that she does not want life support and CPR etc.   patient was changed to DNR status.  DVT Prophylaxis: SCDs PUD Prophylaxis: PPI Code Status: DNR Family Communication: Daughter being updated on a daily basis Disposition Plan: Plan is for placement to skilled nursing facility.  Hopefully in the next 24 to 48 hours.   Medications:  Scheduled: . acidophilus  1 capsule Oral BID  . atorvastatin  40 mg Oral QHS  . carvedilol  12.5 mg Oral BID WC  . Chlorhexidine Gluconate Cloth  6 each Topical Daily  . feeding  supplement  1 Container Oral TID BM  . FLUoxetine  20 mg Oral Daily  . furosemide  40 mg Intravenous Q12H  . Gerhardt's butt cream   Topical TID  . hydrALAZINE  100 mg Oral Q8H  . insulin aspart  0-15 Units Subcutaneous TID WC  . insulin glargine  15 Units Subcutaneous QPM  . multivitamin with minerals  1 tablet Oral Daily  . nystatin cream   Topical TID  . pantoprazole  40 mg Oral BID AC  . rOPINIRole  0.25 mg Oral QHS  . sucralfate  1 g Oral TID WC & HS  . vitamin B-12  250 mcg Oral Daily   Continuous:  IWL:NLGXQJJHERDEY, hydrALAZINE   Objective:  Vital Signs  Vitals:  08/15/19 1600 08/15/19 2041 08/16/19 0409 08/16/19 0728  BP: (!) 139/53 134/67 (!) 145/53 (!) 130/48  Pulse: 68 72 69   Resp: (!) 27 20 18    Temp: 97.8 F (36.6 C) (!) 97.5 F (36.4 C) (!) 97.5 F (36.4 C) 97.6 F (36.4 C)  TempSrc: Oral Oral Oral Oral  SpO2: 98% 98% 96%   Weight:      Height:        Intake/Output Summary (Last 24 hours) at 08/16/2019 0957 Last data filed at 08/16/2019 0947 Gross per 24 hour  Intake 480 ml  Output 1275 ml  Net -795 ml   Filed Weights   08/13/19 0431 08/14/19 0344 08/15/19 0504  Weight: (!) 180.5 kg (!) 172.4 kg (!) 172 kg    General appearance: Awake alert.  In no distress.  Morbidly obese.  Distracted Resp: Improved aeration bilateral lungs.  Few crackles at the bases no wheezing or rhonchi. Cardio: S1-S2 is normal regular.  No S3-S4.  No rubs murmurs or bruit GI: Abdomen is soft.  Nontender nondistended.  Bowel sounds are present normal.  No masses organomegaly Extremities: Dressing noted over lower legs. Neurologic: Disoriented.  No obvious focal deficits.   Lab Results:  Data Reviewed: I have personally reviewed following labs and imaging studies  CBC: Recent Labs  Lab 08/11/19 0210 08/12/19 0215 08/13/19 0000 08/14/19 0025 08/15/19 0119 08/16/19 0357  WBC 9.6 8.7 9.6 10.1 10.3 10.6*  NEUTROABS 8.3* 7.3 7.9* 8.0* 7.9*  --   HGB 7.0* 8.4*  9.6* 10.1* 10.0* 10.5*  HCT 22.1* 27.1* 30.2* 32.6* 32.1* 33.8*  MCV 91.7 90.6 89.3 90.8 90.9 91.8  PLT 265 268 302 329 314 161    Basic Metabolic Panel: Recent Labs  Lab 08/12/19 0215 08/13/19 0000 08/14/19 0025 08/15/19 0119 08/16/19 0357  NA 139 141 141 144 144  K 4.5 3.8 3.6 3.6 3.9  CL 98 99 99 103 102  CO2 28 28 30 30 31   GLUCOSE 117* 150* 192* 255* 87  BUN 75* 69* 67* 63* 55*  CREATININE 2.63* 2.38* 2.15* 1.93* 1.81*  CALCIUM 8.2* 8.2* 8.2* 8.4* 8.5*  MG  --   --   --   --  1.6*    GFR: Estimated Creatinine Clearance: 43.7 mL/min (A) (by C-G formula based on SCr of 1.81 mg/dL (H)).  Liver Function Tests: Recent Labs  Lab 08/11/19 0210 08/12/19 0215 08/13/19 0000 08/14/19 0025 08/15/19 0119  AST 22 24 22 23 22   ALT 15 17 15 15 15   ALKPHOS 66 71 69 66 73  BILITOT 1.6* 0.9 1.3* 1.2 0.9  PROT 5.4* 5.8* 5.6* 5.7* 5.5*  ALBUMIN 2.0* 2.0* 2.1* 2.0* 2.0*     Coagulation Profile: Recent Labs  Lab 08/12/19 0215 08/13/19 0000 08/14/19 0025 08/15/19 0119 08/16/19 0357  INR 1.3* 1.5* 1.6* 1.7* 1.9*     CBG: Recent Labs  Lab 08/12/19 1737 08/12/19 2211 08/13/19 2130 08/15/19 1156 08/15/19 1743  GLUCAP 216* 171* 181* 194* 178*     Recent Results (from the past 240 hour(s))  Culture, blood (routine x 2)     Status: None   Collection Time: 08/08/19 10:06 PM   Specimen: BLOOD  Result Value Ref Range Status   Specimen Description BLOOD LEFT ANTECUBITAL  Final   Special Requests   Final    BOTTLES DRAWN AEROBIC AND ANAEROBIC Blood Culture adequate volume   Culture   Final    NO GROWTH 5 DAYS Performed at [facility],  [place], [place] [address]    Report Status 08/13/2019 FINAL  Final  Culture, blood (routine x 2)     Status: None   Collection Time: 08/08/19 10:07 PM   Specimen: BLOOD RIGHT HAND  Result Value Ref Range Status   Specimen Description BLOOD RIGHT HAND  Final   Special Requests   Final    BOTTLES DRAWN  AEROBIC AND ANAEROBIC Blood Culture adequate volume   Culture   Final    NO GROWTH 5 DAYS Performed at [facility], [address] [place], [place] [address]    Report Status 08/13/2019 FINAL  Final  SARS CORONAVIRUS 2 (TAT 6-24 HRS) Nasopharyngeal Nasopharyngeal Swab     Status: Abnormal   Collection Time: 08/09/19  1:20 AM   Specimen: Nasopharyngeal Swab  Result Value Ref Range Status   SARS Coronavirus 2 POSITIVE (A) NEGATIVE Final    Comment: RESULT CALLED TO, READ BACK BY AND VERIFIED WITH: ARIEL SMITH RN.@[email or web address]  ON 11.8.2020 BY TCALDWELL MT. (NOTE) SARS-CoV-2 target nucleic acids are DETECTED. The SARS-CoV-2 RNA is generally detectable in upper and lower respiratory specimens during the acute phase of infection. Positive results are indicative of active infection with SARS-CoV-2. Clinical  correlation with patient history and other diagnostic information is necessary to determine patient infection status. Positive results do  not rule out bacterial infection or co-infection with other viruses. The expected result is Negative. Fact Sheet for Patients: SugarRoll.be Fact Sheet for Healthcare Providers: https://www.woods-mathews.com/ This test is not yet approved or cleared by the [place] FDA and  has been authorized for detection and/or diagnosis of SARS-CoV-2 by FDA under an Emergency Use Authorization (EUA). This EUA will remain  in effect (meaning this test can  be used) for the duration of the COVID-19 declaration under Section 564(b)(1) of the Act, 21 U.S.C. section 360bbb-3(b)(1), unless the authorization is terminated or revoked sooner. Performed at [facility], [place] [address], [place], [place] [address]       Radiology Studies: No results found.     LOS: 6 days   Norvell Caswell [employer] on [email or web address]  08/16/2019, 9:57 AM

## 2019-08-17 LAB — BASIC METABOLIC PANEL
Anion gap: 11 (ref 5–15)
BUN: 55 mg/dL — ABNORMAL HIGH (ref 8–23)
CO2: 27 mmol/L (ref 22–32)
Calcium: 8.1 mg/dL — ABNORMAL LOW (ref 8.9–10.3)
Chloride: 102 mmol/L (ref 98–111)
Creatinine, Ser: 1.81 mg/dL — ABNORMAL HIGH (ref 0.44–1.00)
GFR calc Af Amer: 31 mL/min — ABNORMAL LOW (ref >60)
GFR calc non Af Amer: 27 mL/min — ABNORMAL LOW (ref >60)
Glucose, Bld: 121 mg/dL — ABNORMAL HIGH (ref 70–99)
Potassium: 4.3 mmol/L (ref 3.5–5.1)
Sodium: 140 mmol/L (ref 135–145)

## 2019-08-17 LAB — CBC
HCT: 33 % — ABNORMAL LOW (ref 36.0–46.0)
Hemoglobin: 10.1 g/dL — ABNORMAL LOW (ref 12.0–15.0)
MCH: 28.1 pg (ref 26.0–34.0)
MCHC: 30.6 g/dL (ref 30.0–36.0)
MCV: 91.7 fL (ref 80.0–100.0)
Platelets: 307 10*3/uL (ref 150–400)
RBC: 3.6 MIL/uL — ABNORMAL LOW (ref 3.87–5.11)
RDW: 15.3 % (ref 11.5–15.5)
WBC: 10.4 10*3/uL (ref 4.0–10.5)
nRBC: 0 % (ref 0.0–0.2)

## 2019-08-17 LAB — PROTIME-INR
INR: 1.9 — ABNORMAL HIGH (ref 0.8–1.2)
Prothrombin Time: 21.4 seconds — ABNORMAL HIGH (ref 11.4–15.2)

## 2019-08-17 MED ORDER — FUROSEMIDE 20 MG PO TABS: 60.0000 mg | ORAL_TABLET | Freq: Every day | ORAL | 3 refills | Status: AC

## 2019-08-17 MED ORDER — GERHARDT'S BUTT CREAM: TOPICAL_CREAM | CUTANEOUS | Status: AC

## 2019-08-17 MED ORDER — HYDRALAZINE HCL 50 MG PO TABS: 100.0000 mg | ORAL_TABLET | Freq: Three times a day (TID) | ORAL | Status: AC

## 2019-08-17 MED ORDER — CARVEDILOL 12.5 MG PO TABS: 12.5000 mg | ORAL_TABLET | Freq: Two times a day (BID) | ORAL | Status: DC

## 2019-08-17 NOTE — TOC Progression Note (Signed)
Transition of Care [facility]) - Progression Note    Patient Details  Name: Megan Obrien MRN: [id number] Date of Birth: [birth year]  [facility] CM/SW Contact  Joaquin Courts, RN Phone Number: 08/17/2019, 11:08 AM  Clinical Narrative:    CM spoke with admissions rep at [facility] where patient is a long term resident. Rep requests dc summary for return to facility today. This was faxed via [place]. Patient to return to room 309-B, bedside RN please call report to [phone].  CM called patient's daughter to notify of impending discharge, voicemail left, awaiting call back. Patient will need to transport via [facility].    Expected Discharge Plan: [place] Barriers to Discharge: No Barriers Identified  Expected Discharge Plan and Services Expected Discharge Plan: [place]   Discharge Planning Services: CM Consult Post Acute Care Choice: Resumption of Svcs/PTA Provider Living arrangements for the past 2 months: [place] Expected Discharge Date: 08/17/19               DME Arranged: N/A DME Agency: NA       HH Arranged: NA HH Agency: NA         Social Determinants of Health (SDOH) Interventions    Readmission Risk Interventions No flowsheet data found.

## 2019-08-17 NOTE — Discharge Summary (Signed)
[facility]  Physician Discharge Summary   Patient ID: Megan Obrien MRN: [id number] DOB/AGE: 1946/05/09 73 y.o.  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 CBC and basic metabolic panel on a weekly basis starting Thursday 2. Patient to follow-up with her cardiologist Dr. Fletcher Anon in 2 to 3 weeks 3. To follow-up with gastroenterologist Dr. [name] in 2 to 3 weeks as well to consider endoscopy 4. Speech therapy to follow 5. Palliative medicine consult at skilled nursing facility 6. Wound care as mentioned below to the lower extremities   Patient going to skilled nursing facility  CODE STATUS: DNR  DISCHARGE CONDITION: fair  Diet recommendation: Dysphagia 3 diet with thin liquids  INITIAL HISTORY: [name] a73 y.o.female,with obesity, CHF, hypertension, dyslipidemia, GERD, stage III chronic kidney disease, CVA, chronic A. fib/LV thrombuson warfarin, who is long-term resident at [facility], she was sent to ED for generalized weakness, altered mental status,and being pale, work-up significant for hemoglobin of 3.9, baseline around 8, she is Hemoccult positive but no frank melena or hematochezia, transfused 4 units PRBC with good response, hemoglobin is 7, seen by GI at [facility], plan was for endoscopy, but her COVID-19 was positive, so she was transferred to [facility] for further management.  Consultants:  Gastroenterology at [facility], Dr. [name] Phone discussion with her cardiologist Dr. Fletcher Anon  Procedures:   Transthoracic echocardiogram 11/12 1. There is moderate mitral stenosis due to severe mitral annular calcification. The mean gradient is 7.5 mmHg with heart rate of 63 bpm. The MVA by continuity is 1.50 cm2. When compared with the most recent study 12/18/2017, there is no significant  change in the mitral valve gradients. The rhythm is noted to be NSR with frequent PACs. 2. The tricuspid regurgitant velocity is 2.90 m/s, and with an assumed right atrial pressure of 15 mmHg, the estimated right ventricular systolic pressure is mildly elevated at 48.6 mmHg. 3. PA pressure estimated ~48/27 mmHg. mPAP ~ 34 mmHg which is mild pulmonary HTN. 4. Left ventricular ejection fraction, by visual estimation, is 60 to 65%. The left ventricle has normal function. There is mildly increased left ventricular hypertrophy. 5. Global right ventricle has normal systolic function.The right ventricular size is normal. No increase in right ventricular wall thickness. 6. Left atrial size was moderately dilated. 7. Right atrial size was normal. 8. Severe mitral annular calcification. 9. The mitral valve is degenerative. Mild mitral valve regurgitation. Moderate mitral stenosis. 10. The tricuspid valve is grossly normal. Tricuspid valve regurgitation is trivial. 11. The aortic valve is tricuspid. Aortic valve regurgitation is not visualized. Mild to moderate aortic valve sclerosis/calcification without any evidence of aortic stenosis. 12. The pulmonic valve was grossly normal. Pulmonic valve regurgitation is mild. 13. Mildly elevated pulmonary artery systolic pressure. 14. The left ventricle has no regional wall motion abnormalities. 15. The inferior vena cava is dilated in size with <50% respiratory variability, suggesting right atrial pressure of 15  mmHg. 16. Trivial pericardial effusion is present. 17. The pericardial effusion is circumferential.    HOSPITAL COURSE:   Acute Hypoxic  Resp. Failure/Pneumonia due to COVID-19 Patient was hospitalized.  She was started on remdesivir and steroids.  She was also started on antibacterials due to concern for secondary bacterial infection.  She is completed course of remdesivir and antibiotics.  She was also given intravenous Lasix.  She has improved.  She is saturating normal on room air.  Inflammatory markers have improved. CRP went from 29 to 12.8.  Symptomatic anemia/chronic blood loss anemia Presented with a hemoglobin of 3.9 on admission.  She had normal colored stool but was Hemoccult positive.  She did have significantly elevated INR.  Gastroenterology was consulted at [facility].  It was felt that she did not need EGD urgently due to her COVID-19 standpoint.  She has not had any overt bleeding.  Total of 6 units of PRBC were transfused.  Hemoglobin has responded appropriately and stable for the last several days.  Suspected GI bleed Was thought to be slow GI bleed in the setting of coagulopathy with INR greater than 10.  However her stool color was normal.  Hemoccult was positive.  Patient was seen by gastroenterology at [facility], Dr. [name].  Due to COVID-19 status she did not undergo any endoscopy as it was thought to be not needed on an urgent basis.  Continue PPI.  She will need to be seen by gastroenterology when she is over her acute illness in the next few weeks.  Might need to undergo endoscopy on an elective basis at that time.  Paroxysmal atrial fibrillation, valvular with history of left atrial appendage thrombus/Mitral stenosis Telemetry here has shown atrial fibrillation.  Patient had been on warfarin rather than direct acting agents due to concern for valvular A. Fib.  Patient noted to have a moderate mitral stenosis on echocardiogram.  Her echocardiogram from last year showed LA  thrombus.  She has had 2 episodes when her INR have gone greater than 10 in the last few months.  Holding warfarin due to significant anemia and suspicion for GI bleed.  Family is aware of risk of stroke.  Echocardiogram repeated here.  No mention of any clot in the left atrium.  Continues to show moderate mitral stenosis.  Trivial pericardial effusion was noted.  Discussed with Dr. Fletcher Anon as to plan for anticoagulation.  In view of the fact that she had life-threatening anemia and has not been able to have endoscopy, to see if she did have a source of bleeding or not, it is felt that this time that putting her on anticoagulation will increase her risk of rebleed significantly.  Recommendation is to hold off on anticoagulation.  Dr. Fletcher Anon will arrange follow-up in his clinic in 2 to 3 weeks.    Coagulopathy INR was greater than 10 secondary to warfarin.  She has had this previously as well.  She received vitamin K x2.  INR has improved.  Anticoagulation on hold for now as discussed above.  Acute on chronic diastolic CHF Placed on intravenous Lasix.  Volume status has improved.  She has diuresed well.  Weight has improved.  Changed to oral Lasix.  Foley catheter was discontinued.  Acute kidney injury on chronic kidney disease stage III Baseline creatinine around 2.  It was 2.89 on admission.  Creatinine improving on a daily basis.  Has good urine output.  Creatinine down to 1.81.  Good urine output.  Check labs periodically.  Diabetes mellitus type 2, uncontrolled with hyperglycemia Continue Lantus.  Stop glimepiride.  HbA1c 12.9 in 2019 and 5.6 during this hospitalization.  Monitor CBGs.    Acute metabolic encephalopathy Likely due to acute illness as discussed above.  Remains distracted but no obvious focal neurological deficits.   Essential hypertension/bradycardia Hydralazine dose was increased.  Carvedilol dose had to be decreased due to bradycardia.  Lisinopril on hold due to elevated  creatinine.  She may need alternative antihypertensive such as amlodipine if her blood pressure remains poorly controlled.    Hyperlipidemia Continue with statin.  Pressure injury Pressure Injury 08/10/19 Sacrum Mid Stage II -  Partial thickness loss of dermis presenting as a shallow open ulcer with a red, pink wound bed without slough. sarum red (Active)  08/10/19 1300  Location: Sacrum  Location Orientation: Mid  Staging: Stage II -  Partial thickness loss of dermis presenting as a shallow open ulcer with a red, pink wound bed without slough.  Wound Description (Comments): sarum red  Present on Admission: Yes   Morbid obesity Estimated body mass index is 65.09 kg/m as calculated from the following:   Height as of this encounter: 5\' 4"  (1.626 m).   Weight as of this encounter: 172 kg.  Lower extremity wounds Wound care as mentioned: Wound care to bilateral LEs:  Cleanse LEs with soap and water, rinse and pat dry. Apply unfolded pieces of xeroform gauze Kellie Simmering # 294) to legs where partial thickness wounds and weeping exists.  Cover with ABD pads to anterior and posterior LEs, cover any remaining pieces of xeroform with dry gauze 4x4s.  Secure by wrapping from just below toes to just below knees with Kerlix roll gauze/paper tape.  Place feet into [employer].  Goals of care Discussed with daughter.  CODE STATUS was reviewed with her.  Patient has expressed a wish previously that she does not want life support and CPR etc.   Patient was changed to DNR status.  Please consider palliative medicine consult at skilled nursing facility.  Overall stable.  Okay for discharge to skilled nursing facility.   PERTINENT LABS:  The results of significant diagnostics from this hospitalization (including imaging, microbiology, ancillary and laboratory) are listed below for reference.    Microbiology: Recent Results (from the past 240 hour(s))  Culture, blood (routine x 2)     Status:  None   Collection Time: 08/08/19 10:06 PM   Specimen: BLOOD  Result Value Ref Range Status   Specimen Description BLOOD LEFT ANTECUBITAL  Final   Special Requests   Final    BOTTLES DRAWN AEROBIC AND ANAEROBIC Blood Culture adequate volume   Culture   Final    NO GROWTH 5 DAYS Performed at [facility], [address] [place], [place] [address]    Report Status 08/13/2019 FINAL  Final  Culture, blood (routine x 2)     Status: None   Collection Time: 08/08/19 10:07 PM   Specimen: BLOOD RIGHT HAND  Result Value Ref Range Status   Specimen Description BLOOD RIGHT HAND  Final   Special Requests   Final    BOTTLES DRAWN AEROBIC AND ANAEROBIC Blood Culture adequate volume   Culture   Final    NO GROWTH 5 DAYS Performed at [facility], [address] [place], [place] [address]    Report Status 08/13/2019 FINAL  Final  SARS CORONAVIRUS 2 (TAT 6-24 HRS) Nasopharyngeal Nasopharyngeal Swab     Status: Abnormal   Collection Time: 08/09/19  1:20 AM   Specimen: Nasopharyngeal Swab  Result Value Ref Range Status   SARS Coronavirus 2 POSITIVE (A) NEGATIVE Final  Comment: RESULT CALLED TO, READ BACK BY AND VERIFIED WITH: ARIEL SMITH [email or web address]  ON 11.8.2020 BY TCALDWELL MT. (NOTE) SARS-CoV-2 target nucleic acids are DETECTED. The SARS-CoV-2 RNA is generally detectable in upper and lower respiratory specimens during the acute phase of infection. Positive results are indicative of active infection with SARS-CoV-2. Clinical  correlation with patient history and other diagnostic information is necessary to determine patient infection status. Positive results do  not rule out bacterial infection or co-infection with other viruses. The expected result is Negative. Fact Sheet for Patients: SugarRoll.be Fact Sheet for Healthcare Providers: https://www.woods-mathews.com/ This test is not yet approved or cleared by the [place] FDA  and  has been authorized for detection and/or diagnosis of SARS-CoV-2 by FDA under an Emergency Use Authorization (EUA). This EUA will remain  in effect (meaning this test can  be used) for the duration of the COVID-19 declaration under Section 564(b)(1) of the Act, 21 U.S.C. section 360bbb-3(b)(1), unless the authorization is terminated or revoked sooner. Performed at [facility], [place] [address] [place], [place] [address]      Labs:  COVID-19 Labs  Recent Labs    08/15/19 0119  CRP 12.8*    Lab Results  Component Value Date   SARSCOV2NAA POSITIVE (A) 08/09/2019   SARSCOV2NAA NOT DETECTED 03/07/2019   [place] NEGATIVE 02/08/2019      Basic Metabolic Panel: Recent Labs  Lab 08/13/19 0000 08/14/19 0025 08/15/19 0119 08/16/19 0357 08/17/19 0431  NA 141 141 144 144 140  K 3.8 3.6 3.6 3.9 4.3  CL 99 99 103 102 102  CO2 28 30 30 31 27   GLUCOSE 150* 192* 255* 87 121*  BUN 69* 67* 63* 55* 55*  CREATININE 2.38* 2.15* 1.93* 1.81* 1.81*  CALCIUM 8.2* 8.2* 8.4* 8.5* 8.1*  MG  --   --   --  1.6*  --    Liver Function Tests: Recent Labs  Lab 08/11/19 0210 08/12/19 0215 08/13/19 0000 08/14/19 0025 08/15/19 0119  AST 22 24 22 23 22   ALT 15 17 15 15 15   ALKPHOS 66 71 69 66 73  BILITOT 1.6* 0.9 1.3* 1.2 0.9  PROT 5.4* 5.8* 5.6* 5.7* 5.5*  ALBUMIN 2.0* 2.0* 2.1* 2.0* 2.0*   CBC: Recent Labs  Lab 08/11/19 0210 08/12/19 0215 08/13/19 0000 08/14/19 0025 08/15/19 0119 08/16/19 0357 08/17/19 0431  WBC 9.6 8.7 9.6 10.1 10.3 10.6* 10.4  NEUTROABS 8.3* 7.3 7.9* 8.0* 7.9*  --   --   HGB 7.0* 8.4* 9.6* 10.1* 10.0* 10.5* 10.1*  HCT 22.1* 27.1* 30.2* 32.6* 32.1* 33.8* 33.0*  MCV 91.7 90.6 89.3 90.8 90.9 91.8 91.7  PLT 265 268 302 329 314 314 307   BNP: BNP (last 3 results) Recent Labs    11/24/18 1354 08/10/19 1600  BNP 257.7* 668.9*    CBG: Recent Labs  Lab 08/12/19 2211 08/13/19 2130 08/15/19 1156 08/15/19 1743 08/16/19 1121  GLUCAP  171* 181* 194* 178* 128*     IMAGING STUDIES Dg Chest Portable 1 View  Result Date: 08/08/2019 CLINICAL DATA:  Initial evaluation for acute altered mental status. EXAM: PORTABLE CHEST 1 VIEW COMPARISON:  Prior radiograph from 01/01/2019. FINDINGS: Cardiomegaly, grossly stable from previous. Mediastinal silhouette within normal limits. Aortic atherosclerosis. Lungs mildly hypoinflated. Diffuse pulmonary vascular congestion with interstitial prominence compatible with mild pulmonary interstitial edema. Superimposed dense opacity at the retrocardiac left lower lobe could reflect atelectasis or infiltrate or possibly effusion. Suspected small left pleural effusion. No pneumothorax. No acute  osseous finding. Prominent degenerative changes noted about the shoulders bilaterally. IMPRESSION: 1. Cardiomegaly with mild diffuse pulmonary interstitial edema. Probable small left pleural effusion. 2. Superimposed dense opacity at the retrocardiac left lower lobe, which may reflect atelectasis, infiltrate, or effusion. 3.  Aortic Atherosclerosis (ICD10-I70.0). Electronically Signed   By: Jeannine Boga M.D.   On: 08/08/2019 22:29   Dg Hip Unilat With Pelvis 2-3 Views Left  Result Date: 08/06/2019 CLINICAL DATA:  Left hip and groin pain EXAM: DG HIP (WITH OR WITHOUT PELVIS) 2-3V LEFT COMPARISON:  None. FINDINGS: Left hip in internal rotation limiting radiographic evaluation. No displaced fracture. No pelvic fracture or mass lesion. Calcification overlying the sacrum likely lymph node or calcified uterine fibroid. Mild degenerative change right hip joint. Soft tissue calcification around the greater trochanter on the right. Disc degeneration with disc space narrowing and spurring on the left L4-5. IMPRESSION: No displaced fracture. Internal rotation left hip limits radiographic evaluation. If the patient is not able to bear weight, further imaging evaluation recommended. Electronically Signed   By: Franchot Gallo  M.D.   On: 08/06/2019 07:34    DISCHARGE EXAMINATION: Vitals:   08/16/19 2000 08/17/19 0400 08/17/19 0500 08/17/19 0755  BP: (!) 151/64 (!) 133/43  (!) 157/81  Pulse: 72 72  70  Resp: 20 (!) 21  14  Temp: 98 F (36.7 C) 98 F (36.7 C)  97.7 F (36.5 C)  TempSrc: Oral Oral  Oral  SpO2: 97% 98%  98%  Weight:   (!) 171 kg   Height:       General appearance: Somnolent this morning but easily arousable.  Slightly irritable today.  Distracted. Resp: Diminished air entry at the bases.  No wheezing or rhonchi. Cardio: S1-S2 is normal regular.  No S3-S4.  No rubs murmurs or bruit GI: Abdomen is soft.  Nontender nondistended.  Bowel sounds are present normal.  No masses organomegaly Extremities: Dressing noted over her lower extremities.   DISPOSITION: SNF  Discharge Instructions    Call MD for:  difficulty breathing, headache or visual disturbances   Complete by: As directed    Call MD for:  extreme fatigue   Complete by: As directed    Call MD for:  persistant dizziness or light-headedness   Complete by: As directed    Call MD for:  persistant nausea and vomiting   Complete by: As directed    Call MD for:  severe uncontrolled pain   Complete by: As directed    Call MD for:  temperature >100.4   Complete by: As directed    Discharge instructions   Complete by: As directed    Please review instructions on the discharge summary.  COVID 19 INSTRUCTIONS  - You are felt to be stable enough to no longer require inpatient monitoring, testing, and treatment, though you will need to follow the recommendations below: - Based on the CDC's non-test criteria for ending self-isolation: You may not return to work/leave the home until at least 21 days since symptom onset AND 3 days without a fever (without taking tylenol, ibuprofen, etc.) AND have improvement in respiratory symptoms. - Do not take NSAID medications (including, but not limited to, ibuprofen, advil, motrin, naproxen, aleve,  goody's powder, etc.) - Follow up with your doctor in the next week via telehealth or seek medical attention right away if your symptoms get WORSE.  - Consider donating plasma after you have recovered (either 14 days after a negative test or 28 days after symptoms  have completely resolved) because your antibodies to this virus may be helpful to give to others with life-threatening infections. Please go to the website www.oneblood.org if you would like to consider volunteering for plasma donation.    Directions for you at home:  Wear a facemask You should wear a facemask that covers your nose and mouth when you are in the same room with other people and when you visit a healthcare provider. People who live with or visit you should also wear a facemask while they are in the same room with you.  Separate yourself from other people in your home As much as possible, you should stay in a different room from other people in your home. Also, you should use a separate bathroom, if available.  Avoid sharing household items You should not share dishes, drinking glasses, cups, eating utensils, towels, bedding, or other items with other people in your home. After using these items, you should wash them thoroughly with soap and water.  Cover your coughs and sneezes Cover your mouth and nose with a tissue when you cough or sneeze, or you can cough or sneeze into your sleeve. Throw used tissues in a lined trash can, and immediately wash your hands with soap and water for at least 20 seconds or use an alcohol-based hand rub.  Wash your [facility] your hands often and thoroughly with soap and water for at least 20 seconds. You can use an alcohol-based hand sanitizer if soap and water are not available and if your hands are not visibly dirty. Avoid touching your eyes, nose, and mouth with unwashed hands.  Directions for those who live with, or provide care at home for you:  Limit the number of people who  have contact with the patient If possible, have only one caregiver for the patient. Other household members should stay in another home or place of residence. If this is not possible, they should stay in another room, or be separated from the patient as much as possible. Use a separate bathroom, if available. Restrict visitors who do not have an essential need to be in the home.  Ensure good ventilation Make sure that shared spaces in the home have good air flow, such as from an air conditioner or an opened window, weather permitting.  Wash your hands often Wash your hands often and thoroughly with soap and water for at least 20 seconds. You can use an alcohol based hand sanitizer if soap and water are not available and if your hands are not visibly dirty. Avoid touching your eyes, nose, and mouth with unwashed hands. Use disposable paper towels to dry your hands. If not available, use dedicated cloth towels and replace them when they become wet.  Wear a facemask and gloves Wear a disposable facemask at all times in the room and gloves when you touch or have contact with the patient's blood, body fluids, and/or secretions or excretions, such as sweat, saliva, sputum, nasal mucus, vomit, urine, or feces.  Ensure the mask fits over your nose and mouth tightly, and do not touch it during use. Throw out disposable facemasks and gloves after using them. Do not reuse. Wash your hands immediately after removing your facemask and gloves. If your personal clothing becomes contaminated, carefully remove clothing and launder. Wash your hands after handling contaminated clothing. Place all used disposable facemasks, gloves, and other waste in a lined container before disposing them with other household waste. Remove gloves and wash your hands immediately after  handling these items.  Do not share dishes, glasses, or other household items with the patient Avoid sharing household items. You should not share  dishes, drinking glasses, cups, eating utensils, towels, bedding, or other items with a patient who is confirmed to have, or being evaluated for, COVID-19 infection. After the person uses these items, you should wash them thoroughly with soap and water.  Wash laundry thoroughly Immediately remove and wash clothes or bedding that have blood, body fluids, and/or secretions or excretions, such as sweat, saliva, sputum, nasal mucus, vomit, urine, or feces, on them. Wear gloves when handling laundry from the patient. Read and follow directions on labels of laundry or clothing items and detergent. In general, wash and dry with the warmest temperatures recommended on the label.  Clean all areas the individual has used often Clean all touchable surfaces, such as counters, tabletops, doorknobs, bathroom fixtures, toilets, phones, keyboards, tablets, and bedside tables, every day. Also, clean any surfaces that may have blood, body fluids, and/or secretions or excretions on them. Wear gloves when cleaning surfaces the patient has come in contact with. Use a diluted bleach solution (e.g., dilute bleach with 1 part bleach and 10 parts water) or a household disinfectant with a label that says EPA-registered for coronaviruses. To make a bleach solution at home, add 1 tablespoon of bleach to 1 quart (4 cups) of water. For a larger supply, add  cup of bleach to 1 gallon (16 cups) of water. Read labels of cleaning products and follow recommendations provided on product labels. Labels contain instructions for safe and effective use of the cleaning product including precautions you should take when applying the product, such as wearing gloves or eye protection and making sure you have good ventilation during use of the product. Remove gloves and wash hands immediately after cleaning.  Monitor yourself for signs and symptoms of illness Caregivers and household members are considered close contacts, should monitor their  health, and will be asked to limit movement outside of the home to the extent possible. Follow the monitoring steps for close contacts listed on the symptom monitoring form.   If you have additional questions, contact your local health department or call the epidemiologist on call at [phone] (available 24/7). This guidance is subject to change. For the most up-to-date guidance from [facility], please refer to their website: YouBlogs.pl   You were cared for by a hospitalist during your hospital stay. If you have any questions about your discharge medications or the care you received while you were in the hospital after you are discharged, you can call the unit and asked to speak with the hospitalist on call if the hospitalist that took care of you is not available. Once you are discharged, your primary care physician will handle any further medical issues. Please note that NO REFILLS for any discharge medications will be authorized once you are discharged, as it is imperative that you return to your primary care physician (or establish a relationship with a primary care physician if you do not have one) for your aftercare needs so that they can reassess your need for medications and monitor your lab values. If you do not have a primary care physician, you can call [phone] for a physician referral.   Increase activity slowly   Complete by: As directed         Allergies as of 08/17/2019   No Known Allergies     Medication List    STOP taking these medications  glimepiride 2 MG tablet Commonly known as: AMARYL   HYDROcodone-acetaminophen 5-325 MG tablet Commonly known as: NORCO/VICODIN   lisinopril 20 MG tablet Commonly known as: ZESTRIL   warfarin 1 MG tablet Commonly known as: COUMADIN   warfarin 3 MG tablet Commonly known as: COUMADIN     TAKE these medications   acetaminophen 650 MG CR tablet Commonly known as:  TYLENOL Take 650 mg by mouth every 8 (eight) hours as needed for pain.   alum & mag hydroxide-simeth 200-200-20 MG/5ML suspension Commonly known as: MAALOX/MYLANTA Take 10 mLs by mouth every 4 (four) hours as needed for indigestion or heartburn.   ammonium lactate 12 % lotion Commonly known as: LAC-HYDRIN Apply 1 application topically 2 (two) times daily. Apply to feet   atorvastatin 40 MG tablet Commonly known as: LIPITOR Take 1 tablet (40 mg total) by mouth at bedtime. What changed: how much to take   carvedilol 12.5 MG tablet Commonly known as: COREG Take 1 tablet (12.5 mg total) by mouth 2 (two) times daily with a meal. What changed:   medication strength  how much to take   docusate sodium 100 MG capsule Commonly known as: COLACE Take 1 capsule (100 mg total) by mouth 2 (two) times daily.   FLUoxetine 40 MG capsule Commonly known as: PROZAC Take 1 capsule by mouth once daily for depression and anxiety.   furosemide 20 MG tablet Commonly known as: LASIX Take 3 tablets (60 mg total) by mouth daily. What changed: how much to take   Gerhardt's butt cream Crea Apply in a thin layer after cleansing to perineal area, posterior and medial thighs and buttocks.  Turn from side to side with a pillow between knees to promote air-flow   hydrALAZINE 50 MG tablet Commonly known as: APRESOLINE Take 2 tablets (100 mg total) by mouth 3 (three) times daily. What changed:   medication strength  how much to take  when to take this   Insulin Glargine 100 UNIT/ML Solostar Pen Commonly known as: LANTUS Inject 15 Units into the skin every evening. What changed:   how much to take  when to take this   loperamide 2 MG capsule Commonly known as: IMODIUM Take 2 mg by mouth 4 (four) times daily as needed for diarrhea or loose stools.   magnesium oxide 400 MG tablet Commonly known as: MAG-OX Take 400 mg by mouth daily.   Melatonin 5 MG Tabs Take 5 mg by mouth at bedtime.    multivitamin tablet Take 1 tablet by mouth daily.   omega-3 acid ethyl esters 1 g capsule Commonly known as: LOVAZA TAKE 1 CAPSULE BY MOUTH ONCE DAILY WITH FOOD FOR CHOLESTEROL What changed:   how much to take  how to take this  when to take this   omeprazole 40 MG capsule Commonly known as: PRILOSEC Take 1 capsule (40 mg total) by mouth daily. What changed: when to take this   rOPINIRole 0.25 MG tablet Commonly known as: REQUIP Take 0.25 mg by mouth at bedtime.   saccharomyces boulardii 250 MG capsule Commonly known as: FLORASTOR Take 250 mg by mouth 2 (two) times daily.   sucralfate 1 g tablet Commonly known as: CARAFATE Take 1 g by mouth 4 (four) times daily -  with meals and at bedtime.   vitamin B-12 250 MCG tablet Commonly known as: CYANOCOBALAMIN Take 250 mcg by mouth daily.   Voltaren 1 % Gel Generic drug: diclofenac sodium Apply 2 g topically 2 (two) times a  day. To both knees        Follow-up Information    [name], MD. Schedule an appointment as soon as possible for a visit in 1 week(s).   Specialty: Family Medicine Contact information: [place] [address] [phone]        Wellington Hampshire, MD Follow up.   Specialty: Cardiology Why: Follow-up for atrial fibrillation and reconsideration of anticoagulation Contact information: [address] [place] [address] [phone]        Efrain Sella, MD Follow up.   Specialty: Gastroenterology Contact information: [place] [address] [phone]           TOTAL DISCHARGE TIME: 16 minutes  [place]  [facility] Pager on [email or web address]  08/17/2019, 10:24 AM

## 2019-08-17 NOTE — Plan of Care (Signed)
Pt A/Ox1-2

## 2019-08-17 NOTE — Progress Notes (Addendum)
Pt had one small BM, pad changed and pt cleaned.  Pt ate 1 container of ice cream after given insulin.  Assisted with transfer to stretcher, no complications noted, vss.  EMS now in charge of care.  Daughter Santiago Glad was updated on pt location and condition. 08/17/2019 [place], RN

## 2019-08-17 NOTE — Discharge Instructions (Signed)

## 2019-08-17 NOTE — Progress Notes (Signed)
Report was given to [facility] RN at [facility], will set up transport to have pt transferred soon. 08/17/2019 [place], RN

## 2019-08-18 LAB — GLUCOSE, CAPILLARY
Glucose-Capillary: 272 mg/dL — ABNORMAL HIGH (ref 70–99)
Glucose-Capillary: 113 mg/dL — ABNORMAL HIGH (ref 70–99)
Glucose-Capillary: 149 mg/dL — ABNORMAL HIGH (ref 70–99)
Glucose-Capillary: 136 mg/dL — ABNORMAL HIGH (ref 70–99)
Glucose-Capillary: 174 mg/dL — ABNORMAL HIGH (ref 70–99)
Glucose-Capillary: 123 mg/dL — ABNORMAL HIGH (ref 70–99)
Glucose-Capillary: 183 mg/dL — ABNORMAL HIGH (ref 70–99)

## 2019-08-19 DIAGNOSIS — K219 Gastro-esophageal reflux disease without esophagitis: Secondary | ICD-10-CM | POA: Diagnosis not present

## 2019-08-19 DIAGNOSIS — I10 Essential (primary) hypertension: Secondary | ICD-10-CM | POA: Diagnosis not present

## 2019-08-19 DIAGNOSIS — E785 Hyperlipidemia, unspecified: Secondary | ICD-10-CM | POA: Diagnosis not present

## 2019-08-19 DIAGNOSIS — I482 Chronic atrial fibrillation, unspecified: Secondary | ICD-10-CM | POA: Diagnosis not present

## 2019-08-19 DIAGNOSIS — E119 Type 2 diabetes mellitus without complications: Secondary | ICD-10-CM | POA: Diagnosis not present

## 2019-08-19 DIAGNOSIS — U071 COVID-19: Secondary | ICD-10-CM | POA: Diagnosis not present

## 2019-08-19 DIAGNOSIS — K5909 Other constipation: Secondary | ICD-10-CM | POA: Diagnosis not present

## 2019-08-19 DIAGNOSIS — R262 Difficulty in walking, not elsewhere classified: Secondary | ICD-10-CM | POA: Diagnosis not present

## 2019-08-20 DIAGNOSIS — I482 Chronic atrial fibrillation, unspecified: Secondary | ICD-10-CM | POA: Diagnosis not present

## 2019-08-20 DIAGNOSIS — Z794 Long term (current) use of insulin: Secondary | ICD-10-CM | POA: Diagnosis not present

## 2019-08-20 DIAGNOSIS — I10 Essential (primary) hypertension: Secondary | ICD-10-CM | POA: Diagnosis not present

## 2019-08-20 DIAGNOSIS — E119 Type 2 diabetes mellitus without complications: Secondary | ICD-10-CM | POA: Diagnosis not present

## 2019-08-20 DIAGNOSIS — K5909 Other constipation: Secondary | ICD-10-CM | POA: Diagnosis not present

## 2019-08-20 DIAGNOSIS — M6281 Muscle weakness (generalized): Secondary | ICD-10-CM | POA: Diagnosis not present

## 2019-08-20 DIAGNOSIS — E785 Hyperlipidemia, unspecified: Secondary | ICD-10-CM | POA: Diagnosis not present

## 2019-08-20 DIAGNOSIS — Z8619 Personal history of other infectious and parasitic diseases: Secondary | ICD-10-CM | POA: Diagnosis not present

## 2019-08-20 DIAGNOSIS — D649 Anemia, unspecified: Secondary | ICD-10-CM | POA: Diagnosis not present

## 2019-08-20 DIAGNOSIS — U071 COVID-19: Secondary | ICD-10-CM | POA: Diagnosis not present

## 2019-08-20 DIAGNOSIS — Z8673 Personal history of transient ischemic attack (TIA), and cerebral infarction without residual deficits: Secondary | ICD-10-CM | POA: Diagnosis not present

## 2019-08-20 DIAGNOSIS — K219 Gastro-esophageal reflux disease without esophagitis: Secondary | ICD-10-CM | POA: Diagnosis not present

## 2019-08-20 DIAGNOSIS — R262 Difficulty in walking, not elsewhere classified: Secondary | ICD-10-CM | POA: Diagnosis not present

## 2019-08-20 LAB — ABO/RH: ABO/RH(D): B POS

## 2019-08-21 DIAGNOSIS — E785 Hyperlipidemia, unspecified: Secondary | ICD-10-CM | POA: Diagnosis not present

## 2019-08-21 DIAGNOSIS — U071 COVID-19: Secondary | ICD-10-CM | POA: Diagnosis not present

## 2019-08-21 DIAGNOSIS — I83028 Varicose veins of left lower extremity with ulcer other part of lower leg: Secondary | ICD-10-CM | POA: Diagnosis not present

## 2019-08-21 DIAGNOSIS — K219 Gastro-esophageal reflux disease without esophagitis: Secondary | ICD-10-CM | POA: Diagnosis not present

## 2019-08-21 DIAGNOSIS — K5909 Other constipation: Secondary | ICD-10-CM | POA: Diagnosis not present

## 2019-08-21 DIAGNOSIS — Z872 Personal history of diseases of the skin and subcutaneous tissue: Secondary | ICD-10-CM | POA: Diagnosis not present

## 2019-08-21 DIAGNOSIS — R262 Difficulty in walking, not elsewhere classified: Secondary | ICD-10-CM | POA: Diagnosis not present

## 2019-08-21 DIAGNOSIS — I83029 Varicose veins of left lower extremity with ulcer of unspecified site: Secondary | ICD-10-CM | POA: Diagnosis not present

## 2019-08-21 DIAGNOSIS — I10 Essential (primary) hypertension: Secondary | ICD-10-CM | POA: Diagnosis not present

## 2019-08-21 DIAGNOSIS — I482 Chronic atrial fibrillation, unspecified: Secondary | ICD-10-CM | POA: Diagnosis not present

## 2019-08-21 DIAGNOSIS — E119 Type 2 diabetes mellitus without complications: Secondary | ICD-10-CM | POA: Diagnosis not present

## 2019-08-21 DIAGNOSIS — I83018 Varicose veins of right lower extremity with ulcer other part of lower leg: Secondary | ICD-10-CM | POA: Diagnosis not present

## 2019-08-24 DIAGNOSIS — K5909 Other constipation: Secondary | ICD-10-CM | POA: Diagnosis not present

## 2019-08-24 DIAGNOSIS — E119 Type 2 diabetes mellitus without complications: Secondary | ICD-10-CM | POA: Diagnosis not present

## 2019-08-24 DIAGNOSIS — I482 Chronic atrial fibrillation, unspecified: Secondary | ICD-10-CM | POA: Diagnosis not present

## 2019-08-24 DIAGNOSIS — K219 Gastro-esophageal reflux disease without esophagitis: Secondary | ICD-10-CM | POA: Diagnosis not present

## 2019-08-24 DIAGNOSIS — R262 Difficulty in walking, not elsewhere classified: Secondary | ICD-10-CM | POA: Diagnosis not present

## 2019-08-24 DIAGNOSIS — I10 Essential (primary) hypertension: Secondary | ICD-10-CM | POA: Diagnosis not present

## 2019-08-24 DIAGNOSIS — U071 COVID-19: Secondary | ICD-10-CM | POA: Diagnosis not present

## 2019-08-24 DIAGNOSIS — E785 Hyperlipidemia, unspecified: Secondary | ICD-10-CM | POA: Diagnosis not present

## 2019-08-25 DIAGNOSIS — E119 Type 2 diabetes mellitus without complications: Secondary | ICD-10-CM | POA: Diagnosis not present

## 2019-08-25 DIAGNOSIS — R262 Difficulty in walking, not elsewhere classified: Secondary | ICD-10-CM | POA: Diagnosis not present

## 2019-08-25 DIAGNOSIS — E785 Hyperlipidemia, unspecified: Secondary | ICD-10-CM | POA: Diagnosis not present

## 2019-08-25 DIAGNOSIS — U071 COVID-19: Secondary | ICD-10-CM | POA: Diagnosis not present

## 2019-08-25 DIAGNOSIS — K5909 Other constipation: Secondary | ICD-10-CM | POA: Diagnosis not present

## 2019-08-25 DIAGNOSIS — K219 Gastro-esophageal reflux disease without esophagitis: Secondary | ICD-10-CM | POA: Diagnosis not present

## 2019-08-25 DIAGNOSIS — Z79899 Other long term (current) drug therapy: Secondary | ICD-10-CM | POA: Diagnosis not present

## 2019-08-25 DIAGNOSIS — I10 Essential (primary) hypertension: Secondary | ICD-10-CM | POA: Diagnosis not present

## 2019-08-25 DIAGNOSIS — I482 Chronic atrial fibrillation, unspecified: Secondary | ICD-10-CM | POA: Diagnosis not present

## 2019-08-26 DIAGNOSIS — I83028 Varicose veins of left lower extremity with ulcer other part of lower leg: Secondary | ICD-10-CM | POA: Diagnosis not present

## 2019-08-26 DIAGNOSIS — I482 Chronic atrial fibrillation, unspecified: Secondary | ICD-10-CM | POA: Diagnosis not present

## 2019-08-26 DIAGNOSIS — K219 Gastro-esophageal reflux disease without esophagitis: Secondary | ICD-10-CM | POA: Diagnosis not present

## 2019-08-26 DIAGNOSIS — U071 COVID-19: Secondary | ICD-10-CM | POA: Diagnosis not present

## 2019-08-26 DIAGNOSIS — K5909 Other constipation: Secondary | ICD-10-CM | POA: Diagnosis not present

## 2019-08-26 DIAGNOSIS — I10 Essential (primary) hypertension: Secondary | ICD-10-CM | POA: Diagnosis not present

## 2019-08-26 DIAGNOSIS — E785 Hyperlipidemia, unspecified: Secondary | ICD-10-CM | POA: Diagnosis not present

## 2019-08-26 DIAGNOSIS — R262 Difficulty in walking, not elsewhere classified: Secondary | ICD-10-CM | POA: Diagnosis not present

## 2019-08-26 DIAGNOSIS — E119 Type 2 diabetes mellitus without complications: Secondary | ICD-10-CM | POA: Diagnosis not present

## 2019-08-26 DIAGNOSIS — L98492 Non-pressure chronic ulcer of skin of other sites with fat layer exposed: Secondary | ICD-10-CM | POA: Diagnosis not present

## 2019-08-27 DIAGNOSIS — K219 Gastro-esophageal reflux disease without esophagitis: Secondary | ICD-10-CM | POA: Diagnosis not present

## 2019-08-27 DIAGNOSIS — I10 Essential (primary) hypertension: Secondary | ICD-10-CM | POA: Diagnosis not present

## 2019-08-27 DIAGNOSIS — I482 Chronic atrial fibrillation, unspecified: Secondary | ICD-10-CM | POA: Diagnosis not present

## 2019-08-27 DIAGNOSIS — E785 Hyperlipidemia, unspecified: Secondary | ICD-10-CM | POA: Diagnosis not present

## 2019-08-27 DIAGNOSIS — R262 Difficulty in walking, not elsewhere classified: Secondary | ICD-10-CM | POA: Diagnosis not present

## 2019-08-27 DIAGNOSIS — E119 Type 2 diabetes mellitus without complications: Secondary | ICD-10-CM | POA: Diagnosis not present

## 2019-08-27 DIAGNOSIS — D649 Anemia, unspecified: Secondary | ICD-10-CM | POA: Diagnosis not present

## 2019-08-27 DIAGNOSIS — U071 COVID-19: Secondary | ICD-10-CM | POA: Diagnosis not present

## 2019-08-27 DIAGNOSIS — K5909 Other constipation: Secondary | ICD-10-CM | POA: Diagnosis not present

## 2019-08-28 DIAGNOSIS — U071 COVID-19: Secondary | ICD-10-CM | POA: Diagnosis not present

## 2019-08-28 DIAGNOSIS — K219 Gastro-esophageal reflux disease without esophagitis: Secondary | ICD-10-CM | POA: Diagnosis not present

## 2019-08-28 DIAGNOSIS — R262 Difficulty in walking, not elsewhere classified: Secondary | ICD-10-CM | POA: Diagnosis not present

## 2019-08-28 DIAGNOSIS — E785 Hyperlipidemia, unspecified: Secondary | ICD-10-CM | POA: Diagnosis not present

## 2019-08-28 DIAGNOSIS — E119 Type 2 diabetes mellitus without complications: Secondary | ICD-10-CM | POA: Diagnosis not present

## 2019-08-28 DIAGNOSIS — K5909 Other constipation: Secondary | ICD-10-CM | POA: Diagnosis not present

## 2019-08-28 DIAGNOSIS — I482 Chronic atrial fibrillation, unspecified: Secondary | ICD-10-CM | POA: Diagnosis not present

## 2019-08-28 DIAGNOSIS — I10 Essential (primary) hypertension: Secondary | ICD-10-CM | POA: Diagnosis not present

## 2019-08-29 DIAGNOSIS — I10 Essential (primary) hypertension: Secondary | ICD-10-CM | POA: Diagnosis not present

## 2019-08-29 DIAGNOSIS — D649 Anemia, unspecified: Secondary | ICD-10-CM | POA: Diagnosis not present

## 2019-08-29 DIAGNOSIS — K219 Gastro-esophageal reflux disease without esophagitis: Secondary | ICD-10-CM | POA: Diagnosis not present

## 2019-08-29 DIAGNOSIS — U071 COVID-19: Secondary | ICD-10-CM | POA: Diagnosis not present

## 2019-08-29 DIAGNOSIS — R262 Difficulty in walking, not elsewhere classified: Secondary | ICD-10-CM | POA: Diagnosis not present

## 2019-08-29 DIAGNOSIS — K5909 Other constipation: Secondary | ICD-10-CM | POA: Diagnosis not present

## 2019-08-29 DIAGNOSIS — R319 Hematuria, unspecified: Secondary | ICD-10-CM | POA: Diagnosis not present

## 2019-08-29 DIAGNOSIS — Z79899 Other long term (current) drug therapy: Secondary | ICD-10-CM | POA: Diagnosis not present

## 2019-08-29 DIAGNOSIS — E785 Hyperlipidemia, unspecified: Secondary | ICD-10-CM | POA: Diagnosis not present

## 2019-08-29 DIAGNOSIS — I482 Chronic atrial fibrillation, unspecified: Secondary | ICD-10-CM | POA: Diagnosis not present

## 2019-08-29 DIAGNOSIS — N390 Urinary tract infection, site not specified: Secondary | ICD-10-CM | POA: Diagnosis not present

## 2019-08-29 DIAGNOSIS — E119 Type 2 diabetes mellitus without complications: Secondary | ICD-10-CM | POA: Diagnosis not present

## 2019-08-31 DIAGNOSIS — I10 Essential (primary) hypertension: Secondary | ICD-10-CM | POA: Diagnosis not present

## 2019-08-31 DIAGNOSIS — I482 Chronic atrial fibrillation, unspecified: Secondary | ICD-10-CM | POA: Diagnosis not present

## 2019-08-31 DIAGNOSIS — R262 Difficulty in walking, not elsewhere classified: Secondary | ICD-10-CM | POA: Diagnosis not present

## 2019-08-31 DIAGNOSIS — U071 COVID-19: Secondary | ICD-10-CM | POA: Diagnosis not present

## 2019-08-31 DIAGNOSIS — K5909 Other constipation: Secondary | ICD-10-CM | POA: Diagnosis not present

## 2019-08-31 DIAGNOSIS — E119 Type 2 diabetes mellitus without complications: Secondary | ICD-10-CM | POA: Diagnosis not present

## 2019-08-31 DIAGNOSIS — E785 Hyperlipidemia, unspecified: Secondary | ICD-10-CM | POA: Diagnosis not present

## 2019-08-31 DIAGNOSIS — K219 Gastro-esophageal reflux disease without esophagitis: Secondary | ICD-10-CM | POA: Diagnosis not present

## 2019-09-01 DIAGNOSIS — K5909 Other constipation: Secondary | ICD-10-CM | POA: Diagnosis not present

## 2019-09-01 DIAGNOSIS — E785 Hyperlipidemia, unspecified: Secondary | ICD-10-CM | POA: Diagnosis not present

## 2019-09-01 DIAGNOSIS — I10 Essential (primary) hypertension: Secondary | ICD-10-CM | POA: Diagnosis not present

## 2019-09-01 DIAGNOSIS — E119 Type 2 diabetes mellitus without complications: Secondary | ICD-10-CM | POA: Diagnosis not present

## 2019-09-01 DIAGNOSIS — U071 COVID-19: Secondary | ICD-10-CM | POA: Diagnosis not present

## 2019-09-01 DIAGNOSIS — I482 Chronic atrial fibrillation, unspecified: Secondary | ICD-10-CM | POA: Diagnosis not present

## 2019-09-01 DIAGNOSIS — B351 Tinea unguium: Secondary | ICD-10-CM | POA: Diagnosis not present

## 2019-09-01 DIAGNOSIS — I739 Peripheral vascular disease, unspecified: Secondary | ICD-10-CM | POA: Diagnosis not present

## 2019-09-01 DIAGNOSIS — K219 Gastro-esophageal reflux disease without esophagitis: Secondary | ICD-10-CM | POA: Diagnosis not present

## 2019-09-01 DIAGNOSIS — R262 Difficulty in walking, not elsewhere classified: Secondary | ICD-10-CM | POA: Diagnosis not present

## 2019-09-02 DIAGNOSIS — R262 Difficulty in walking, not elsewhere classified: Secondary | ICD-10-CM | POA: Diagnosis not present

## 2019-09-02 DIAGNOSIS — I482 Chronic atrial fibrillation, unspecified: Secondary | ICD-10-CM | POA: Diagnosis not present

## 2019-09-02 DIAGNOSIS — I83028 Varicose veins of left lower extremity with ulcer other part of lower leg: Secondary | ICD-10-CM | POA: Diagnosis not present

## 2019-09-02 DIAGNOSIS — K5909 Other constipation: Secondary | ICD-10-CM | POA: Diagnosis not present

## 2019-09-02 DIAGNOSIS — I10 Essential (primary) hypertension: Secondary | ICD-10-CM | POA: Diagnosis not present

## 2019-09-02 DIAGNOSIS — E119 Type 2 diabetes mellitus without complications: Secondary | ICD-10-CM | POA: Diagnosis not present

## 2019-09-02 DIAGNOSIS — K219 Gastro-esophageal reflux disease without esophagitis: Secondary | ICD-10-CM | POA: Diagnosis not present

## 2019-09-02 DIAGNOSIS — E785 Hyperlipidemia, unspecified: Secondary | ICD-10-CM | POA: Diagnosis not present

## 2019-09-02 DIAGNOSIS — U071 COVID-19: Secondary | ICD-10-CM | POA: Diagnosis not present

## 2019-09-03 ENCOUNTER — Telehealth: Payer: Self-pay

## 2019-09-03 ENCOUNTER — Other Ambulatory Visit: Payer: Self-pay

## 2019-09-03 ENCOUNTER — Encounter: Payer: Self-pay | Admitting: Nurse Practitioner

## 2019-09-03 ENCOUNTER — Ambulatory Visit (INDEPENDENT_AMBULATORY_CARE_PROVIDER_SITE_OTHER): Payer: Medicare Other | Admitting: Nurse Practitioner

## 2019-09-03 VITALS — BP 120/70 | Ht 66.5 in

## 2019-09-03 DIAGNOSIS — I050 Rheumatic mitral stenosis: Secondary | ICD-10-CM

## 2019-09-03 DIAGNOSIS — K219 Gastro-esophageal reflux disease without esophagitis: Secondary | ICD-10-CM | POA: Diagnosis not present

## 2019-09-03 DIAGNOSIS — I4819 Other persistent atrial fibrillation: Secondary | ICD-10-CM | POA: Diagnosis not present

## 2019-09-03 DIAGNOSIS — I5032 Chronic diastolic (congestive) heart failure: Secondary | ICD-10-CM

## 2019-09-03 DIAGNOSIS — I10 Essential (primary) hypertension: Secondary | ICD-10-CM

## 2019-09-03 DIAGNOSIS — I482 Chronic atrial fibrillation, unspecified: Secondary | ICD-10-CM | POA: Diagnosis not present

## 2019-09-03 DIAGNOSIS — E785 Hyperlipidemia, unspecified: Secondary | ICD-10-CM | POA: Diagnosis not present

## 2019-09-03 DIAGNOSIS — K922 Gastrointestinal hemorrhage, unspecified: Secondary | ICD-10-CM | POA: Diagnosis not present

## 2019-09-03 DIAGNOSIS — R262 Difficulty in walking, not elsewhere classified: Secondary | ICD-10-CM | POA: Diagnosis not present

## 2019-09-03 DIAGNOSIS — E119 Type 2 diabetes mellitus without complications: Secondary | ICD-10-CM | POA: Diagnosis not present

## 2019-09-03 DIAGNOSIS — N183 Chronic kidney disease, stage 3 unspecified: Secondary | ICD-10-CM

## 2019-09-03 DIAGNOSIS — U071 COVID-19: Secondary | ICD-10-CM | POA: Diagnosis not present

## 2019-09-03 DIAGNOSIS — K5909 Other constipation: Secondary | ICD-10-CM | POA: Diagnosis not present

## 2019-09-03 NOTE — Progress Notes (Signed)
Office Visit    Patient Name: Megan Obrien Date of Encounter: 09/03/2019  Primary Care Provider:  [name], MD Primary Cardiologist:  [name], MD  Chief Complaint    73 year old female with history of chronic diastolic heart failure, pulmonary hypertension, left atrial appendage thrombus, persistent atrial fibrillation, mod mitral stenosis, DM2, hyperlipidemia, morbid obesity, stroke, CKD III, OSA, and hypertension, and who is being seen for 3-week follow-up after recent admission for GIB and Covid-19 infection.  Past Medical History    Past Medical History:  Diagnosis Date  . (HFpEF) heart failure with preserved ejection fraction ([place])    a. 03/2017 Echo: EF 55-60%, no rwma, Gr1 DD, mild to mod MS, mod to sev dil LA; b. 08/2019 Echo: EF 60-65%, PA 48/27(34). Nl RV fxn. Mod dil LA. Mild MR, mod MS. Triv TR. Mild to mod Ao Sclerosis w/o stenosis. Mild PR.  [name] Arthritis   . Carotid arterial disease ([place])    a. 11/2017 CTA Head/Neck: RICA 45, LICA 60.  [name] Cerebrovascular disease    a. 11/2017 MRA: mod to sev stenosis of bilat PCA's P2 segments; b. 11/2017 CTA Head/Neck: RICA 45, LICA 60, sev bilat distal MCA branch stenoses w/ sev bilat P2 stenoses.  . CKD (chronic kidney disease), stage III   . COVID-19 08/2019  . Depression   . GERD (gastroesophageal reflux disease)   . GIB (gastrointestinal bleeding)    a. 08/2019 in setting of INR>10.  . Hyperlipidemia   . Hypertension   . Left Atrial Appendage Thrombus    a. 11/2017 TEE (performed in setting of stroke/aphasia): nl EF, mod MS, dil LA w/ heavy smoke and small LAA thrombus-->Warfarin initiated.  . Mitral stenosis    a. Mild to moderate by echo 03/2017; b. 03/2017 R/LHC: PCWP 70mmHg, LVEDP 80mmHg. Mean grad of 21mmHg. Valve area of 2.02cm^2; c. 11/2017 TEE: Mod MS.  . Morbid obesity ([place])   . Non-obstructive CAD (coronary artery disease)    a. 03/2017 Cath: mild, non-obstructive CAD.  [name] OSA (obstructive sleep apnea)     a. Previously wore CPAP for several years but stopped on her own and now just uses O2 via [place] @ night.  [name] PAH (pulmonary artery hypertension) ([place])    a. 03/2017 RHC: PA 84mmHg.  [name] Persistent atrial fibrillation ([place])   . Seasonal allergies   . Stroke [facility])    a. 11/2017 - presented w/ aphasia->MRI acute to early subacute ischemia w/in multiple vascular territories, largest in R frontal & L parietal lobes. Smaller foci of ischemia w/in R hippocampus, R parietal lobe, and L peripheral postcentral gyrus.  . Type 2 diabetes mellitus ([place])   . Urinary incontinence    Past Surgical History:  Procedure Laterality Date  . BACK SURGERY    . CARDIAC CATHETERIZATION    . KNEE SURGERY Left   . RIGHT/LEFT HEART CATH AND CORONARY ANGIOGRAPHY Bilateral 04/25/2017   Procedure: Right/Left Heart Cath and Coronary Angiography;  Surgeon: Wellington Hampshire, MD;  Location: [place] [facility];  Service: Cardiovascular;  Laterality: Bilateral;  . TEE WITHOUT CARDIOVERSION N/A 12/20/2017   Procedure: TRANSESOPHAGEAL ECHOCARDIOGRAM (TEE);  Surgeon: Wellington Hampshire, MD;  Location: [facility] ORS;  Service: Cardiovascular;  Laterality: N/A;  . TONSILLECTOMY AND ADENOIDECTOMY  1952    Allergies  No Known Allergies  History of Present Illness    73 year old female with history of chronic diastolic heart failure, pulmonary hypertension, left atrial appendage thrombus, persistent atrial fibrillation and moderate mitral  stenosis. She underwent left cardiac catheterization 03/2017 that showed mild obstructive coronary artery disease. Right heart catheterization showed moderately elevated filling pressures, severe pulmonary hypertension, and normal CO. Mean PA pressure was 59 mmHg and PCWP was 3mmHg with LVEDP of 19 mmHg. Mitral stenosis was mild with mean gradient of 8 mmHg and a valve area of 2.02 cm. She was last seen 01/2019 via telemedicine and was at [facility] with reportedly long term poor functional capacity. On  08/08/2019, she was seen at [facility] for upper GIB in the setting of an INR of greater than 10.  Hemoglobin was 3.9.  And her Coumadin reversed with Kcentra and vitamin K.  She required 4 units of packed red blood cells.  She was seen by GI with a plan for EGD however, COVID-19 testing returned positive and EGD was deferred and she was transferred to [facility] for further evaluation.  There, she was treated with remdesivir and steroids with symptomatic improvement.  She was also treated with antibiotics for concern related to secondary bacterial infection.  Creatinine was elevated on admission at 2.9 but improved to 1.81 at the time of discharge.  Telemetry showed Afib and echo was performed and showed normal LV function with moderate mitral stenosis.  In the setting of large GI bleed, Coumadin was not resumed as it was felt that the bleeding risk outweighed stroke risk.  [facility] was [facility] on [facility] 16.  Today, Ms. Seat reports feeling well.  She is disoriented to time and place and is not sure why she has to follow-up with cardiology.  She denies any dyspnea or chest pain.  Activity has been fairly limited as she is more or less wheelchair-bound.  She notes that strength is improving though she is not sure she is working with physical therapy or not.  She denies palpitations, PND, orthopnea, dizziness, syncope, edema, or early satiety.  Home Medications    Prior to Admission medications   Medication Sig Start Date End Date Taking? Authorizing Provider  acetaminophen (TYLENOL) 650 MG CR tablet Take 650 mg by mouth every 8 (eight) hours as needed for pain.    [provider]  alum & mag hydroxide-simeth (MAALOX/MYLANTA) 200-200-20 MG/5ML suspension Take 10 mLs by mouth every 4 (four) hours as needed for indigestion or heartburn.     [provider]  ammonium lactate (LAC-HYDRIN) 12 % lotion Apply 1 application topically 2 (two) times daily. Apply  to feet    [provider]  atorvastatin (LIPITOR) 40 MG tablet Take 1 tablet (40 mg total) by mouth at bedtime. Patient taking differently: Take 20 mg by mouth at bedtime.  08/14/18   Pleas Koch, NP  carvedilol (COREG) 12.5 MG tablet Take 1 tablet (12.5 mg total) by mouth 2 (two) times daily with a meal. 08/17/19   Bonnielee Haff, MD  diclofenac sodium (VOLTAREN) 1 % GEL Apply 2 g topically 2 (two) times a day. To both knees    [provider]  docusate sodium (COLACE) 100 MG capsule Take 1 capsule (100 mg total) by mouth 2 (two) times daily. 01/05/19   Hillary Bow, MD  FLUoxetine (PROZAC) 40 MG capsule Take 1 capsule by mouth once daily for depression and anxiety. 11/11/18   Pleas Koch, NP  furosemide (LASIX) 20 MG tablet Take 3 tablets (60 mg total) by mouth daily. 08/17/19   Bonnielee Haff, MD  hydrALAZINE (APRESOLINE) 50 MG tablet Take 2 tablets (100 mg total)  by mouth 3 (three) times daily. 08/17/19   Bonnielee Haff, MD  Hydrocortisone (GERHARDT'S BUTT CREAM) CREA Apply in a thin layer after cleansing to perineal area, posterior and medial thighs and buttocks.  Turn from side to side with a pillow between knees to promote air-flow 08/17/19   Bonnielee Haff, MD  Insulin Glargine (LANTUS) 100 UNIT/ML Solostar Pen Inject 15 Units into the skin every evening. Patient taking differently: Inject 17 Units into the skin daily.  04/09/18   Pleas Koch, NP  loperamide (IMODIUM) 2 MG capsule Take 2 mg by mouth 4 (four) times daily as needed for diarrhea or loose stools.     [provider]  magnesium oxide (MAG-OX) 400 MG tablet Take 400 mg by mouth daily.    [provider]  Melatonin 5 MG TABS Take 5 mg by mouth at bedtime.    [provider]  Multiple Vitamin (MULTIVITAMIN) tablet Take 1 tablet by mouth daily.    [provider]  omega-3 acid ethyl esters (LOVAZA) 1 g capsule TAKE 1 CAPSULE BY MOUTH ONCE DAILY WITH FOOD FOR  CHOLESTEROL Patient taking differently: Take 1 g by mouth daily. TAKE 1 CAPSULE BY MOUTH ONCE DAILY WITH FOOD FOR CHOLESTEROL 09/01/18   Pleas Koch, NP  omeprazole (PRILOSEC) 40 MG capsule Take 1 capsule (40 mg total) by mouth daily. Patient taking differently: Take 40 mg by mouth 2 (two) times a day.  11/13/18   Pleas Koch, NP  rOPINIRole (REQUIP) 0.25 MG tablet Take 0.25 mg by mouth at bedtime.    [provider]  saccharomyces boulardii (FLORASTOR) 250 MG capsule Take 250 mg by mouth 2 (two) times daily.    [provider]  sucralfate (CARAFATE) 1 g tablet Take 1 g by mouth 4 (four) times daily -  with meals and at bedtime.    [provider]  vitamin B-12 (CYANOCOBALAMIN) 250 MCG tablet Take 250 mcg by mouth daily.    [provider]    Review of Systems    She denies chest pain, palpitations, dyspnea, pnd, orthopnea, n, v, dizziness, syncope, edema, weight gain, or early satiety.  All other systems reviewed and are otherwise negative except as noted above.  Physical Exam    VS:  BP 120/70 (BP Location: Left Arm, Patient Position: Sitting, Cuff Size: Normal)   Ht 5' 6.5" (1.689 m)   BMI 59.94 kg/m  , BMI Body mass index is 59.94 kg/m. GEN: Obese, in no acute distress. HEENT: normal. Neck: Supple, obese, difficult to gauge JVP.  No carotid bruits, or masses. Cardiac: Irregularly irregular, distant, 2/6 systolic murmur heard throughout, no rubs, or gallops. No clubbing, cyanosis, 2+ bilateral ankle edema.  Radials/PT 1+ and equal bilaterally.  Respiratory:  Respirations regular and unlabored, clear to auscultation bilaterally. GI: Obese, soft, nontender, nondistended, BS + x 4. MS: no deformity or atrophy. Skin: warm and dry, no rash. Neuro:  Strength and sensation are intact. Psych: Normal affect.  Accessory Clinical Findings    ECG personally reviewed by me today -atrial fibrillation, left axis deviation, prolonged QT- no acute  changes.  Assessment & Plan    1.  Persistent atrial fibrillation: Patient with recent admission for GI bleed with hemoglobin of 3.9 complicated by [id number] infection requiring transfusion and also transfer to [facility] for remdesivir and steroid therapy.  Noted to be in atrial fibrillation throughout her hospitalization at [facility].  She is well rate controlled on beta-blocker therapy and  asymptomatic.  She is not currently on oral anticoagulation in the setting of recent GI bleed with INR greater than 10.  She will require GI evaluation prior to considering resumption of oral anticoagulation/warfarin and I will refer back to GI today.   2.  Recent GI bleed: Follow-up CBC today.  3.  COVID-19 infection: She notes that she has been recovering well and strength is improving.  She remains on oxygen and denies dyspnea.  4.  HFpEF: She does have mild lower extremity edema but does not otherwise appear to be volume overloaded.  She is not able to stand for weight today.  Heart rate and blood pressure stable.  Continue current regimen including Lasix 60 mg daily.  She does have mild ankle edema though chronicity is unclear.  I have recommended that she keep her legs elevated when sitting.  Follow-up basic metabolic panel today.  5.  Moderate mitral stenosis: Noted on recent echo.  I do not appreciate a diastolic murmur.  No significant dyspnea, though activity is quite limited.  As previously noted, she is not likely to be a suitable candidate for valve surgery in the future given multiple comorbidities and deconditioning.  6.  Stage III chronic kidney disease: Follow-up basic metabolic panel today.  7.  Essential hypertension: Stable.  8.  Hyperlipidemia: LDL 68 in May 2019.  She remains on statin therapy and this is followed by primary care.  9.  Disposition: Refer back to GI given history of GI bleed.  Follow-up lab work today.  Follow-up in clinic in 2 to 3 months.  Murray Hodgkins, NP 09/03/2019, 12:59 PM

## 2019-09-03 NOTE — Patient Instructions (Addendum)
Medication Instructions:  Your physician recommends that you continue on your current medications as directed. Please refer to the Current Medication list given to you today.  *If you need a refill on your cardiac medications before your next appointment, please call your pharmacy*  Lab Work: Bmet and Cbc today If you have labs (blood work) drawn today and your tests are completely normal, you will receive your results only by: [name] MyChart Message (if you have MyChart) OR . A paper copy in the mail If you have any lab test that is abnormal or we need to change your treatment, we will call you to review the results.  Testing/Procedures: None ordered  Follow-Up: At [facility], you and your health needs are our priority.  As part of our continuing mission to provide you with exceptional heart care, we have created designated Provider Care Teams.  These Care Teams include your primary Cardiologist (physician) and Advanced Practice Providers (APPs -  Physician Assistants and Nurse Practitioners) who all work together to provide you with the care you need, when you need it.  Your next appointment:   2 month(s)  The format for your next appointment:   In Person  Provider:    You may see [name], MD or one of the following Advanced Practice Providers on your designated Care Team:    Murray Hodgkins, NP  [name], PA-C  Marrianne Mood, PA-C   Other Instructions You have been referred back  to [facility] Dr. [name]

## 2019-09-03 NOTE — Telephone Encounter (Signed)
Patient was seen today by Ignacia Bayley, NP. Patient was scheduled for a EGD with Dr. [name] that was cancelled due to the patient testing positive for COVID 19. Gerald Stabs would like the patient to f/u with Dr. [name].  Contacted [facility] in [place] Dr. Ricky Stabs office.  Spoke with [employer].  Vilinda Boehringer that the patient does reside at a [facility]. Jinny Blossom will contact the patients daughter Santiago Glad who is listed on the patients DPR and as her emergency contact to schedule an appt with Dr.[place].

## 2019-09-04 ENCOUNTER — Telehealth: Payer: Self-pay

## 2019-09-04 ENCOUNTER — Non-Acute Institutional Stay: Payer: Medicare Other | Admitting: Primary Care

## 2019-09-04 DIAGNOSIS — K219 Gastro-esophageal reflux disease without esophagitis: Secondary | ICD-10-CM | POA: Diagnosis not present

## 2019-09-04 DIAGNOSIS — Z515 Encounter for palliative care: Secondary | ICD-10-CM

## 2019-09-04 DIAGNOSIS — K5909 Other constipation: Secondary | ICD-10-CM | POA: Diagnosis not present

## 2019-09-04 DIAGNOSIS — R262 Difficulty in walking, not elsewhere classified: Secondary | ICD-10-CM | POA: Diagnosis not present

## 2019-09-04 DIAGNOSIS — I482 Chronic atrial fibrillation, unspecified: Secondary | ICD-10-CM | POA: Diagnosis not present

## 2019-09-04 DIAGNOSIS — I10 Essential (primary) hypertension: Secondary | ICD-10-CM | POA: Diagnosis not present

## 2019-09-04 DIAGNOSIS — E785 Hyperlipidemia, unspecified: Secondary | ICD-10-CM | POA: Diagnosis not present

## 2019-09-04 DIAGNOSIS — U071 COVID-19: Secondary | ICD-10-CM | POA: Diagnosis not present

## 2019-09-04 DIAGNOSIS — E119 Type 2 diabetes mellitus without complications: Secondary | ICD-10-CM | POA: Diagnosis not present

## 2019-09-04 LAB — BASIC METABOLIC PANEL
BUN/Creatinine Ratio: 26 (ref 12–28)
BUN: 42 mg/dL — ABNORMAL HIGH (ref 8–27)
CO2: 28 mmol/L (ref 20–29)
Calcium: 8.9 mg/dL (ref 8.7–10.3)
Chloride: 96 mmol/L (ref 96–106)
Creatinine, Ser: 1.62 mg/dL — ABNORMAL HIGH (ref 0.57–1.00)
GFR calc Af Amer: 36 mL/min/{1.73_m2} — ABNORMAL LOW (ref >59)
GFR calc non Af Amer: 31 mL/min/{1.73_m2} — ABNORMAL LOW (ref >59)
Glucose: 128 mg/dL — ABNORMAL HIGH (ref 65–99)
Potassium: 4.1 mmol/L (ref 3.5–5.2)
Sodium: 138 mmol/L (ref 134–144)

## 2019-09-04 LAB — CBC WITH DIFFERENTIAL/PLATELET
Basophils Absolute: 0 10*3/uL (ref 0.0–0.2)
Basos: 0 %
EOS (ABSOLUTE): 0.2 10*3/uL (ref 0.0–0.4)
Eos: 5 %
Hematocrit: 35.5 % (ref 34.0–46.6)
Hemoglobin: 11.3 g/dL (ref 11.1–15.9)
Immature Grans (Abs): 0 10*3/uL (ref 0.0–0.1)
Immature Granulocytes: 1 %
Lymphocytes Absolute: 0.4 10*3/uL — ABNORMAL LOW (ref 0.7–3.1)
Lymphs: 9 %
MCH: 28.3 pg (ref 26.6–33.0)
MCHC: 31.8 g/dL (ref 31.5–35.7)
MCV: 89 fL (ref 79–97)
Monocytes Absolute: 0.3 10*3/uL (ref 0.1–0.9)
Monocytes: 7 %
Neutrophils Absolute: 3.9 10*3/uL (ref 1.4–7.0)
Neutrophils: 78 %
Platelets: 191 10*3/uL (ref 150–450)
RBC: 3.99 x10E6/uL (ref 3.77–5.28)
RDW: 15.1 % (ref 11.7–15.4)
WBC: 5 10*3/uL (ref 3.4–10.8)

## 2019-09-04 NOTE — Telephone Encounter (Signed)
-----   Message from Theora Gianotti, NP sent at 09/04/2019  8:01 AM EST ----- Renal fxn, lytes, blood counts stable. Pt @ [facility].

## 2019-09-04 NOTE — Progress Notes (Signed)
[facility] Palliative Care Consult Note Telephone: [phone]  Fax: [phone]  TELEHEALTH VISIT STATEMENT Due to the COVID-19 crisis, this visit was done via telemedicine from my office. It was initiated and consented to by this patient and/or family.  PATIENT NAME: Megan Obrien [id number] [phone] (home)  DOB: 1946/08/09 MRN: [id number]  PRIMARY CARE PROVIDER:   [name], MD, [address] [place] [address] [phone]  REFERRING PROVIDER:  [name], MD [address] [place],  [place] [address] [phone]  RESPONSIBLE PARTY:   Extended Emergency Contact Information Primary Emergency Contact: [place] of [place] Mobile Phone: [phone] Relation: Daughter Secondary Emergency Contact: Jennette Kettle, [place] of [place] Phone: [phone] Relation: Son   ASSESSMENT AND RECOMMENDATIONS:   1. Advance Care Planning/Goals of Care: Goals include to maximize quality of life and symptom management. Has family in area and sister is POA is sister Vickie.Has had some window visits. [name] is PR of record at SNF, and I called and left a message to connect and to discuss goals of care. No number for Vickie is in the record. Patient is noted as full code at this writing.  2. Symptom Management:   Pain: Denies, states she's comfortable. Denies being uncomfortable with any covid symptoms and states it has resolved.  Fatigue: States some fatigue since covid infection. She wants to get stronger seh reports.   Advance care planning: Full code per record. Call to POA to discuss.   3. Family /Caregiver/Community Supports: Daughter and son are on record, will clarify. Lives in [place].  4. Cognitive / Functional decline: Alert, oriented x 1-2. Is in bed and states she gets up with help. States she is eating well.  5. Follow up Palliative Care Visit:  Palliative care will continue to follow for goals of care clarification and symptom management. Return 8 weeks or prn.  I spent 25 minutes providing this consultation,  from 1030 to 1055. More than 50% of the time in this consultation was spent coordinating communication.   HISTORY OF PRESENT ILLNESS:  Megan Obrien is a 73 y.o. year old female with multiple medical problems including covid infection, CKD, a big, HTN, metabolic encephalopathy, anemia. Palliative Care was asked to follow this patient by consultation request of [name], MD to help address advance care planning and goals of care. This is the initial visit.  CODE STATUS:   PPS: 30% HOSPICE ELIGIBILITY/DIAGNOSIS: TBD  PAST MEDICAL HISTORY:  Past Medical History:  Diagnosis Date  . (HFpEF) heart failure with preserved ejection fraction ([place])    a. 03/2017 Echo: EF 55-60%, no rwma, Gr1 DD, mild to mod MS, mod to sev dil LA; b. 08/2019 Echo: EF 60-65%, PA 48/27(34). Nl RV fxn. Mod dil LA. Mild MR, mod MS. Triv TR. Mild to mod Ao Sclerosis w/o stenosis. Mild PR.  [name] Arthritis   . Carotid arterial disease ([place])    a. 11/2017 CTA Head/Neck: RICA 45, LICA 60.  [name] Cerebrovascular disease    a. 11/2017 MRA: mod to sev stenosis of bilat PCA's P2 segments; b. 11/2017 CTA Head/Neck: RICA 45, LICA 60, sev bilat distal MCA branch stenoses w/ sev bilat P2 stenoses.  . CKD (chronic kidney disease), stage III   . COVID-19 08/2019  . Depression   . GERD (gastroesophageal reflux disease)   . GIB (gastrointestinal bleeding)  a. 08/2019 in setting of INR>10.  . Hyperlipidemia   . Hypertension   . Left Atrial Appendage Thrombus    a. 11/2017 TEE (performed in setting of stroke/aphasia): nl EF, mod MS, dil LA w/ heavy smoke and small LAA thrombus-->Warfarin initiated.  . Mitral stenosis    a. Mild to moderate by echo 03/2017; b. 03/2017 R/LHC: PCWP 80mmHg, LVEDP 17mmHg. Mean grad of 40mmHg. Valve area of 2.02cm^2; c. 11/2017 TEE: Mod MS.   . Morbid obesity ([place])   . Non-obstructive CAD (coronary artery disease)    a. 03/2017 Cath: mild, non-obstructive CAD.  [name] OSA (obstructive sleep apnea)    a. Previously wore CPAP for several years but stopped on her own and now just uses O2 via [place] @ night.  [name] PAH (pulmonary artery hypertension) ([place])    a. 03/2017 RHC: PA 66mmHg.  [name] Persistent atrial fibrillation ([place])   . Seasonal allergies   . Stroke [facility])    a. 11/2017 - presented w/ aphasia->MRI acute to early subacute ischemia w/in multiple vascular territories, largest in R frontal & L parietal lobes. Smaller foci of ischemia w/in R hippocampus, R parietal lobe, and L peripheral postcentral gyrus.  . Type 2 diabetes mellitus ([place])   . Urinary incontinence     SOCIAL HX:  Social History   Tobacco Use  . Smoking status: Never Smoker  . Smokeless tobacco: Never Used  Substance Use Topics  . Alcohol use: No    Alcohol/week: 0.0 standard drinks    Comment: rarely    ALLERGIES: No Known Allergies   PERTINENT MEDICATIONS:  Outpatient Encounter Medications as of 09/04/2019  Medication Sig  . acetaminophen (TYLENOL) 650 MG CR tablet Take 650 mg by mouth every 8 (eight) hours as needed for pain.  [name] alum & mag hydroxide-simeth (MAALOX/MYLANTA) 200-200-20 MG/5ML suspension Take 10 mLs by mouth every 4 (four) hours as needed for indigestion or heartburn.   [name] ammonium lactate (LAC-HYDRIN) 12 % lotion Apply 1 application topically 2 (two) times daily. Apply to feet  . atorvastatin (LIPITOR) 40 MG tablet Take 1 tablet (40 mg total) by mouth at bedtime.  . carvedilol (COREG) 12.5 MG tablet Take 1 tablet (12.5 mg total) by mouth 2 (two) times daily with a meal.  . diclofenac sodium (VOLTAREN) 1 % GEL Apply 2 g topically 2 (two) times a day. To both knees  . docusate sodium (COLACE) 100 MG capsule Take 1 capsule (100 mg total) by mouth 2 (two) times daily.  [name] FLUoxetine (PROZAC) 40 MG capsule Take 1 capsule by mouth once daily for depression  and anxiety.  . furosemide (LASIX) 20 MG tablet Take 3 tablets (60 mg total) by mouth daily.  . hydrALAZINE (APRESOLINE) 50 MG tablet Take 2 tablets (100 mg total) by mouth 3 (three) times daily.  . Hydrocortisone (GERHARDT'S BUTT CREAM) CREA Apply in a thin layer after cleansing to perineal area, posterior and medial thighs and buttocks.  Turn from side to side with a pillow between knees to promote air-flow  . Insulin Glargine (LANTUS) 100 UNIT/ML Solostar Pen Inject 15 Units into the skin every evening. (Patient taking differently: Inject 17 Units into the skin daily. )  . loperamide (IMODIUM) 2 MG capsule Take 2 mg by mouth 4 (four) times daily as needed for diarrhea or loose stools.   . magnesium oxide (MAG-OX) 400 MG tablet Take 400 mg by mouth daily.  . Melatonin 5 MG TABS Take 5 mg by mouth at bedtime.  [name]  Multiple Vitamin (MULTIVITAMIN) tablet Take 1 tablet by mouth daily.  [name] omega-3 acid ethyl esters (LOVAZA) 1 g capsule TAKE 1 CAPSULE BY MOUTH ONCE DAILY WITH FOOD FOR CHOLESTEROL (Patient taking differently: Take 1 g by mouth. TAKE 1 CAPSULE BY MOUTH ONCE DAILY WITH FOOD FOR CHOLESTEROL)  . omeprazole (PRILOSEC) 40 MG capsule Take 1 capsule (40 mg total) by mouth daily. (Patient taking differently: Take 40 mg by mouth 2 (two) times a day. )  . rOPINIRole (REQUIP) 0.25 MG tablet Take 0.25 mg by mouth at bedtime.  . saccharomyces boulardii (FLORASTOR) 250 MG capsule Take 250 mg by mouth 2 (two) times daily.  . sucralfate (CARAFATE) 1 g tablet Take 1 g by mouth 4 (four) times daily -  with meals and at bedtime.  . vitamin B-12 (CYANOCOBALAMIN) 250 MCG tablet Take 250 mcg by mouth daily.   No facility-administered encounter medications on file as of 09/04/2019.     PHYSICAL EXAM / ROS:   Current and past weights: 273 lbs General: NAD, frail appearing, obese, States no pain Cardiovascular: no chest pain reported, no edema reported  Pulmonary: no cough, no increased SOB, Room air Abdomen:  appetite good, denies constipation, incontinent of bowel GU: denies dysuria, incontinent of urine, h/o UTI but no sx now MSK:  no joint deformities,  Non ambulatory Skin: no rashes or wounds reported, record reports skin injury Neurological: Weakness, sleep is good, endorses restless leg syndrome  Jason Coop, NP   [facility]

## 2019-09-04 NOTE — Telephone Encounter (Signed)
Lab results faxed to the fax number provided. Fax confirmation received.

## 2019-09-04 NOTE — Telephone Encounter (Signed)
Call made to [facility] to make them aware of lab results. Spoke to [employer] [place]. She asked that we fax results to [facility] at [phone].  No new orders at this time.   Forwarding message to RN in office to send fax.

## 2019-09-07 ENCOUNTER — Telehealth: Payer: Self-pay | Admitting: Primary Care

## 2019-09-07 DIAGNOSIS — E119 Type 2 diabetes mellitus without complications: Secondary | ICD-10-CM | POA: Diagnosis not present

## 2019-09-07 DIAGNOSIS — U071 COVID-19: Secondary | ICD-10-CM | POA: Diagnosis not present

## 2019-09-07 DIAGNOSIS — E785 Hyperlipidemia, unspecified: Secondary | ICD-10-CM | POA: Diagnosis not present

## 2019-09-07 DIAGNOSIS — I10 Essential (primary) hypertension: Secondary | ICD-10-CM | POA: Diagnosis not present

## 2019-09-07 DIAGNOSIS — I482 Chronic atrial fibrillation, unspecified: Secondary | ICD-10-CM | POA: Diagnosis not present

## 2019-09-07 DIAGNOSIS — K219 Gastro-esophageal reflux disease without esophagitis: Secondary | ICD-10-CM | POA: Diagnosis not present

## 2019-09-07 DIAGNOSIS — K5909 Other constipation: Secondary | ICD-10-CM | POA: Diagnosis not present

## 2019-09-07 DIAGNOSIS — R262 Difficulty in walking, not elsewhere classified: Secondary | ICD-10-CM | POA: Diagnosis not present

## 2019-09-07 NOTE — Telephone Encounter (Signed)
Call returned, I will have to return call in 2 days after I return to the office.

## 2019-09-08 DIAGNOSIS — K219 Gastro-esophageal reflux disease without esophagitis: Secondary | ICD-10-CM | POA: Diagnosis not present

## 2019-09-08 DIAGNOSIS — R262 Difficulty in walking, not elsewhere classified: Secondary | ICD-10-CM | POA: Diagnosis not present

## 2019-09-08 DIAGNOSIS — I482 Chronic atrial fibrillation, unspecified: Secondary | ICD-10-CM | POA: Diagnosis not present

## 2019-09-08 DIAGNOSIS — E785 Hyperlipidemia, unspecified: Secondary | ICD-10-CM | POA: Diagnosis not present

## 2019-09-08 DIAGNOSIS — K5909 Other constipation: Secondary | ICD-10-CM | POA: Diagnosis not present

## 2019-09-08 DIAGNOSIS — U071 COVID-19: Secondary | ICD-10-CM | POA: Diagnosis not present

## 2019-09-08 DIAGNOSIS — E119 Type 2 diabetes mellitus without complications: Secondary | ICD-10-CM | POA: Diagnosis not present

## 2019-09-08 DIAGNOSIS — I10 Essential (primary) hypertension: Secondary | ICD-10-CM | POA: Diagnosis not present

## 2019-09-09 DIAGNOSIS — K219 Gastro-esophageal reflux disease without esophagitis: Secondary | ICD-10-CM | POA: Diagnosis not present

## 2019-09-09 DIAGNOSIS — E785 Hyperlipidemia, unspecified: Secondary | ICD-10-CM | POA: Diagnosis not present

## 2019-09-09 DIAGNOSIS — I10 Essential (primary) hypertension: Secondary | ICD-10-CM | POA: Diagnosis not present

## 2019-09-09 DIAGNOSIS — K5909 Other constipation: Secondary | ICD-10-CM | POA: Diagnosis not present

## 2019-09-09 DIAGNOSIS — L97919 Non-pressure chronic ulcer of unspecified part of right lower leg with unspecified severity: Secondary | ICD-10-CM | POA: Diagnosis not present

## 2019-09-09 DIAGNOSIS — D485 Neoplasm of uncertain behavior of skin: Secondary | ICD-10-CM | POA: Diagnosis not present

## 2019-09-09 DIAGNOSIS — I482 Chronic atrial fibrillation, unspecified: Secondary | ICD-10-CM | POA: Diagnosis not present

## 2019-09-09 DIAGNOSIS — U071 COVID-19: Secondary | ICD-10-CM | POA: Diagnosis not present

## 2019-09-09 DIAGNOSIS — R262 Difficulty in walking, not elsewhere classified: Secondary | ICD-10-CM | POA: Diagnosis not present

## 2019-09-09 DIAGNOSIS — E119 Type 2 diabetes mellitus without complications: Secondary | ICD-10-CM | POA: Diagnosis not present

## 2019-09-10 DIAGNOSIS — E785 Hyperlipidemia, unspecified: Secondary | ICD-10-CM | POA: Diagnosis not present

## 2019-09-10 DIAGNOSIS — U071 COVID-19: Secondary | ICD-10-CM | POA: Diagnosis not present

## 2019-09-10 DIAGNOSIS — I10 Essential (primary) hypertension: Secondary | ICD-10-CM | POA: Diagnosis not present

## 2019-09-10 DIAGNOSIS — I482 Chronic atrial fibrillation, unspecified: Secondary | ICD-10-CM | POA: Diagnosis not present

## 2019-09-10 DIAGNOSIS — R21 Rash and other nonspecific skin eruption: Secondary | ICD-10-CM | POA: Diagnosis not present

## 2019-09-10 DIAGNOSIS — K219 Gastro-esophageal reflux disease without esophagitis: Secondary | ICD-10-CM | POA: Diagnosis not present

## 2019-09-10 DIAGNOSIS — K5909 Other constipation: Secondary | ICD-10-CM | POA: Diagnosis not present

## 2019-09-10 DIAGNOSIS — E119 Type 2 diabetes mellitus without complications: Secondary | ICD-10-CM | POA: Diagnosis not present

## 2019-09-10 DIAGNOSIS — R262 Difficulty in walking, not elsewhere classified: Secondary | ICD-10-CM | POA: Diagnosis not present

## 2019-09-11 DIAGNOSIS — E785 Hyperlipidemia, unspecified: Secondary | ICD-10-CM | POA: Diagnosis not present

## 2019-09-11 DIAGNOSIS — I482 Chronic atrial fibrillation, unspecified: Secondary | ICD-10-CM | POA: Diagnosis not present

## 2019-09-11 DIAGNOSIS — E119 Type 2 diabetes mellitus without complications: Secondary | ICD-10-CM | POA: Diagnosis not present

## 2019-09-11 DIAGNOSIS — I10 Essential (primary) hypertension: Secondary | ICD-10-CM | POA: Diagnosis not present

## 2019-09-11 DIAGNOSIS — U071 COVID-19: Secondary | ICD-10-CM | POA: Diagnosis not present

## 2019-09-11 DIAGNOSIS — K219 Gastro-esophageal reflux disease without esophagitis: Secondary | ICD-10-CM | POA: Diagnosis not present

## 2019-09-11 DIAGNOSIS — K5909 Other constipation: Secondary | ICD-10-CM | POA: Diagnosis not present

## 2019-09-11 DIAGNOSIS — R262 Difficulty in walking, not elsewhere classified: Secondary | ICD-10-CM | POA: Diagnosis not present

## 2019-09-14 DIAGNOSIS — Z79899 Other long term (current) drug therapy: Secondary | ICD-10-CM | POA: Diagnosis not present

## 2019-09-14 DIAGNOSIS — N390 Urinary tract infection, site not specified: Secondary | ICD-10-CM | POA: Diagnosis not present

## 2019-09-14 DIAGNOSIS — D649 Anemia, unspecified: Secondary | ICD-10-CM | POA: Diagnosis not present

## 2019-09-14 DIAGNOSIS — M6281 Muscle weakness (generalized): Secondary | ICD-10-CM | POA: Diagnosis not present

## 2019-09-14 DIAGNOSIS — R319 Hematuria, unspecified: Secondary | ICD-10-CM | POA: Diagnosis not present

## 2019-09-14 DIAGNOSIS — R262 Difficulty in walking, not elsewhere classified: Secondary | ICD-10-CM | POA: Diagnosis not present

## 2019-09-14 DIAGNOSIS — R110 Nausea: Secondary | ICD-10-CM | POA: Diagnosis not present

## 2019-09-14 DIAGNOSIS — K5909 Other constipation: Secondary | ICD-10-CM | POA: Diagnosis not present

## 2019-09-14 DIAGNOSIS — K219 Gastro-esophageal reflux disease without esophagitis: Secondary | ICD-10-CM | POA: Diagnosis not present

## 2019-09-14 DIAGNOSIS — I482 Chronic atrial fibrillation, unspecified: Secondary | ICD-10-CM | POA: Diagnosis not present

## 2019-09-14 DIAGNOSIS — N3000 Acute cystitis without hematuria: Secondary | ICD-10-CM | POA: Diagnosis not present

## 2019-09-14 DIAGNOSIS — E785 Hyperlipidemia, unspecified: Secondary | ICD-10-CM | POA: Diagnosis not present

## 2019-09-14 DIAGNOSIS — I10 Essential (primary) hypertension: Secondary | ICD-10-CM | POA: Diagnosis not present

## 2019-09-14 DIAGNOSIS — E119 Type 2 diabetes mellitus without complications: Secondary | ICD-10-CM | POA: Diagnosis not present

## 2019-09-14 DIAGNOSIS — U071 COVID-19: Secondary | ICD-10-CM | POA: Diagnosis not present

## 2019-09-15 DIAGNOSIS — U071 COVID-19: Secondary | ICD-10-CM | POA: Diagnosis not present

## 2019-09-15 DIAGNOSIS — I10 Essential (primary) hypertension: Secondary | ICD-10-CM | POA: Diagnosis not present

## 2019-09-15 DIAGNOSIS — K5909 Other constipation: Secondary | ICD-10-CM | POA: Diagnosis not present

## 2019-09-15 DIAGNOSIS — E119 Type 2 diabetes mellitus without complications: Secondary | ICD-10-CM | POA: Diagnosis not present

## 2019-09-15 DIAGNOSIS — I482 Chronic atrial fibrillation, unspecified: Secondary | ICD-10-CM | POA: Diagnosis not present

## 2019-09-15 DIAGNOSIS — E785 Hyperlipidemia, unspecified: Secondary | ICD-10-CM | POA: Diagnosis not present

## 2019-09-15 DIAGNOSIS — K219 Gastro-esophageal reflux disease without esophagitis: Secondary | ICD-10-CM | POA: Diagnosis not present

## 2019-09-15 DIAGNOSIS — R262 Difficulty in walking, not elsewhere classified: Secondary | ICD-10-CM | POA: Diagnosis not present

## 2019-09-16 DIAGNOSIS — K5909 Other constipation: Secondary | ICD-10-CM | POA: Diagnosis not present

## 2019-09-16 DIAGNOSIS — I482 Chronic atrial fibrillation, unspecified: Secondary | ICD-10-CM | POA: Diagnosis not present

## 2019-09-16 DIAGNOSIS — U071 COVID-19: Secondary | ICD-10-CM | POA: Diagnosis not present

## 2019-09-16 DIAGNOSIS — I10 Essential (primary) hypertension: Secondary | ICD-10-CM | POA: Diagnosis not present

## 2019-09-16 DIAGNOSIS — K219 Gastro-esophageal reflux disease without esophagitis: Secondary | ICD-10-CM | POA: Diagnosis not present

## 2019-09-16 DIAGNOSIS — E785 Hyperlipidemia, unspecified: Secondary | ICD-10-CM | POA: Diagnosis not present

## 2019-09-16 DIAGNOSIS — L98499 Non-pressure chronic ulcer of skin of other sites with unspecified severity: Secondary | ICD-10-CM | POA: Diagnosis not present

## 2019-09-16 DIAGNOSIS — R262 Difficulty in walking, not elsewhere classified: Secondary | ICD-10-CM | POA: Diagnosis not present

## 2019-09-16 DIAGNOSIS — E119 Type 2 diabetes mellitus without complications: Secondary | ICD-10-CM | POA: Diagnosis not present

## 2019-09-17 DIAGNOSIS — E785 Hyperlipidemia, unspecified: Secondary | ICD-10-CM | POA: Diagnosis not present

## 2019-09-17 DIAGNOSIS — K219 Gastro-esophageal reflux disease without esophagitis: Secondary | ICD-10-CM | POA: Diagnosis not present

## 2019-09-17 DIAGNOSIS — R262 Difficulty in walking, not elsewhere classified: Secondary | ICD-10-CM | POA: Diagnosis not present

## 2019-09-17 DIAGNOSIS — I10 Essential (primary) hypertension: Secondary | ICD-10-CM | POA: Diagnosis not present

## 2019-09-17 DIAGNOSIS — K5909 Other constipation: Secondary | ICD-10-CM | POA: Diagnosis not present

## 2019-09-17 DIAGNOSIS — U071 COVID-19: Secondary | ICD-10-CM | POA: Diagnosis not present

## 2019-09-17 DIAGNOSIS — E119 Type 2 diabetes mellitus without complications: Secondary | ICD-10-CM | POA: Diagnosis not present

## 2019-09-17 DIAGNOSIS — I482 Chronic atrial fibrillation, unspecified: Secondary | ICD-10-CM | POA: Diagnosis not present

## 2019-09-18 ENCOUNTER — Ambulatory Visit: Payer: Medicare Other | Admitting: Nurse Practitioner

## 2019-09-18 DIAGNOSIS — K219 Gastro-esophageal reflux disease without esophagitis: Secondary | ICD-10-CM | POA: Diagnosis not present

## 2019-09-18 DIAGNOSIS — U071 COVID-19: Secondary | ICD-10-CM | POA: Diagnosis not present

## 2019-09-18 DIAGNOSIS — Z8619 Personal history of other infectious and parasitic diseases: Secondary | ICD-10-CM | POA: Diagnosis not present

## 2019-09-18 DIAGNOSIS — R262 Difficulty in walking, not elsewhere classified: Secondary | ICD-10-CM | POA: Diagnosis not present

## 2019-09-18 DIAGNOSIS — I4891 Unspecified atrial fibrillation: Secondary | ICD-10-CM | POA: Diagnosis not present

## 2019-09-18 DIAGNOSIS — Z8673 Personal history of transient ischemic attack (TIA), and cerebral infarction without residual deficits: Secondary | ICD-10-CM | POA: Diagnosis not present

## 2019-09-18 DIAGNOSIS — I10 Essential (primary) hypertension: Secondary | ICD-10-CM | POA: Diagnosis not present

## 2019-09-18 DIAGNOSIS — E785 Hyperlipidemia, unspecified: Secondary | ICD-10-CM | POA: Diagnosis not present

## 2019-09-18 DIAGNOSIS — K5909 Other constipation: Secondary | ICD-10-CM | POA: Diagnosis not present

## 2019-09-18 DIAGNOSIS — E119 Type 2 diabetes mellitus without complications: Secondary | ICD-10-CM | POA: Diagnosis not present

## 2019-09-18 DIAGNOSIS — I482 Chronic atrial fibrillation, unspecified: Secondary | ICD-10-CM | POA: Diagnosis not present

## 2019-09-18 DIAGNOSIS — Z794 Long term (current) use of insulin: Secondary | ICD-10-CM | POA: Diagnosis not present

## 2019-09-21 DIAGNOSIS — E119 Type 2 diabetes mellitus without complications: Secondary | ICD-10-CM | POA: Diagnosis not present

## 2019-09-21 DIAGNOSIS — K219 Gastro-esophageal reflux disease without esophagitis: Secondary | ICD-10-CM | POA: Diagnosis not present

## 2019-09-21 DIAGNOSIS — E785 Hyperlipidemia, unspecified: Secondary | ICD-10-CM | POA: Diagnosis not present

## 2019-09-21 DIAGNOSIS — R262 Difficulty in walking, not elsewhere classified: Secondary | ICD-10-CM | POA: Diagnosis not present

## 2019-09-21 DIAGNOSIS — U071 COVID-19: Secondary | ICD-10-CM | POA: Diagnosis not present

## 2019-09-21 DIAGNOSIS — K5909 Other constipation: Secondary | ICD-10-CM | POA: Diagnosis not present

## 2019-09-21 DIAGNOSIS — I10 Essential (primary) hypertension: Secondary | ICD-10-CM | POA: Diagnosis not present

## 2019-09-21 DIAGNOSIS — I482 Chronic atrial fibrillation, unspecified: Secondary | ICD-10-CM | POA: Diagnosis not present

## 2019-09-24 ENCOUNTER — Emergency Department: Payer: Medicare Other

## 2019-09-24 ENCOUNTER — Other Ambulatory Visit: Payer: Self-pay

## 2019-09-24 ENCOUNTER — Emergency Department
Admission: EM | Admit: 2019-09-24 | Discharge: 2019-09-24 | Disposition: A | Discharge status: Other Acute Inpatient Hospital | Payer: Medicare Other | Attending: Student | Admitting: Student

## 2019-09-24 DIAGNOSIS — K219 Gastro-esophageal reflux disease without esophagitis: Secondary | ICD-10-CM | POA: Diagnosis not present

## 2019-09-24 DIAGNOSIS — Z8673 Personal history of transient ischemic attack (TIA), and cerebral infarction without residual deficits: Secondary | ICD-10-CM | POA: Insufficient documentation

## 2019-09-24 DIAGNOSIS — Y929 Unspecified place or not applicable: Secondary | ICD-10-CM | POA: Insufficient documentation

## 2019-09-24 DIAGNOSIS — S42209A Unspecified fracture of upper end of unspecified humerus, initial encounter for closed fracture: Secondary | ICD-10-CM | POA: Diagnosis not present

## 2019-09-24 DIAGNOSIS — Y999 Unspecified external cause status: Secondary | ICD-10-CM | POA: Insufficient documentation

## 2019-09-24 DIAGNOSIS — W19XXXA Unspecified fall, initial encounter: Secondary | ICD-10-CM | POA: Diagnosis not present

## 2019-09-24 DIAGNOSIS — Z01811 Encounter for preprocedural respiratory examination: Secondary | ICD-10-CM

## 2019-09-24 DIAGNOSIS — M79603 Pain in arm, unspecified: Secondary | ICD-10-CM | POA: Diagnosis not present

## 2019-09-24 DIAGNOSIS — G2581 Restless legs syndrome: Secondary | ICD-10-CM | POA: Diagnosis not present

## 2019-09-24 DIAGNOSIS — Z8619 Personal history of other infectious and parasitic diseases: Secondary | ICD-10-CM | POA: Diagnosis not present

## 2019-09-24 DIAGNOSIS — G4733 Obstructive sleep apnea (adult) (pediatric): Secondary | ICD-10-CM | POA: Diagnosis not present

## 2019-09-24 DIAGNOSIS — N390 Urinary tract infection, site not specified: Secondary | ICD-10-CM | POA: Diagnosis not present

## 2019-09-24 DIAGNOSIS — D62 Acute posthemorrhagic anemia: Secondary | ICD-10-CM | POA: Diagnosis not present

## 2019-09-24 DIAGNOSIS — S300XXA Contusion of lower back and pelvis, initial encounter: Secondary | ICD-10-CM | POA: Diagnosis not present

## 2019-09-24 DIAGNOSIS — S42201A Unspecified fracture of upper end of right humerus, initial encounter for closed fracture: Secondary | ICD-10-CM | POA: Diagnosis not present

## 2019-09-24 DIAGNOSIS — N179 Acute kidney failure, unspecified: Secondary | ICD-10-CM

## 2019-09-24 DIAGNOSIS — E785 Hyperlipidemia, unspecified: Secondary | ICD-10-CM | POA: Diagnosis not present

## 2019-09-24 DIAGNOSIS — S3993XA Unspecified injury of pelvis, initial encounter: Secondary | ICD-10-CM | POA: Diagnosis not present

## 2019-09-24 DIAGNOSIS — I2510 Atherosclerotic heart disease of native coronary artery without angina pectoris: Secondary | ICD-10-CM | POA: Insufficient documentation

## 2019-09-24 DIAGNOSIS — Z79899 Other long term (current) drug therapy: Secondary | ICD-10-CM | POA: Diagnosis not present

## 2019-09-24 DIAGNOSIS — M7989 Other specified soft tissue disorders: Secondary | ICD-10-CM | POA: Diagnosis not present

## 2019-09-24 DIAGNOSIS — S0990XA Unspecified injury of head, initial encounter: Secondary | ICD-10-CM | POA: Insufficient documentation

## 2019-09-24 DIAGNOSIS — M6281 Muscle weakness (generalized): Secondary | ICD-10-CM | POA: Diagnosis not present

## 2019-09-24 DIAGNOSIS — I4819 Other persistent atrial fibrillation: Secondary | ICD-10-CM | POA: Diagnosis not present

## 2019-09-24 DIAGNOSIS — S72491A Other fracture of lower end of right femur, initial encounter for closed fracture: Secondary | ICD-10-CM | POA: Insufficient documentation

## 2019-09-24 DIAGNOSIS — S72351A Displaced comminuted fracture of shaft of right femur, initial encounter for closed fracture: Secondary | ICD-10-CM | POA: Diagnosis not present

## 2019-09-24 DIAGNOSIS — S299XXA Unspecified injury of thorax, initial encounter: Secondary | ICD-10-CM | POA: Diagnosis not present

## 2019-09-24 DIAGNOSIS — S42301A Unspecified fracture of shaft of humerus, right arm, initial encounter for closed fracture: Secondary | ICD-10-CM | POA: Diagnosis not present

## 2019-09-24 DIAGNOSIS — R9431 Abnormal electrocardiogram [ECG] [EKG]: Secondary | ICD-10-CM | POA: Diagnosis not present

## 2019-09-24 DIAGNOSIS — I129 Hypertensive chronic kidney disease with stage 1 through stage 4 chronic kidney disease, or unspecified chronic kidney disease: Secondary | ICD-10-CM | POA: Insufficient documentation

## 2019-09-24 DIAGNOSIS — Z7401 Bed confinement status: Secondary | ICD-10-CM | POA: Diagnosis not present

## 2019-09-24 DIAGNOSIS — Z794 Long term (current) use of insulin: Secondary | ICD-10-CM | POA: Diagnosis not present

## 2019-09-24 DIAGNOSIS — S4991XA Unspecified injury of right shoulder and upper arm, initial encounter: Secondary | ICD-10-CM | POA: Diagnosis present

## 2019-09-24 DIAGNOSIS — N183 Chronic kidney disease, stage 3 unspecified: Secondary | ICD-10-CM | POA: Diagnosis not present

## 2019-09-24 DIAGNOSIS — S7001XA Contusion of right hip, initial encounter: Secondary | ICD-10-CM | POA: Diagnosis not present

## 2019-09-24 DIAGNOSIS — E1122 Type 2 diabetes mellitus with diabetic chronic kidney disease: Secondary | ICD-10-CM | POA: Diagnosis not present

## 2019-09-24 DIAGNOSIS — Y939 Activity, unspecified: Secondary | ICD-10-CM | POA: Diagnosis not present

## 2019-09-24 DIAGNOSIS — I050 Rheumatic mitral stenosis: Secondary | ICD-10-CM | POA: Diagnosis not present

## 2019-09-24 DIAGNOSIS — E119 Type 2 diabetes mellitus without complications: Secondary | ICD-10-CM | POA: Insufficient documentation

## 2019-09-24 DIAGNOSIS — T797XXA Traumatic subcutaneous emphysema, initial encounter: Secondary | ICD-10-CM | POA: Diagnosis not present

## 2019-09-24 DIAGNOSIS — S79911A Unspecified injury of right hip, initial encounter: Secondary | ICD-10-CM | POA: Diagnosis not present

## 2019-09-24 DIAGNOSIS — I272 Pulmonary hypertension, unspecified: Secondary | ICD-10-CM | POA: Diagnosis not present

## 2019-09-24 DIAGNOSIS — I5032 Chronic diastolic (congestive) heart failure: Secondary | ICD-10-CM | POA: Diagnosis not present

## 2019-09-24 DIAGNOSIS — S72401A Unspecified fracture of lower end of right femur, initial encounter for closed fracture: Secondary | ICD-10-CM

## 2019-09-24 DIAGNOSIS — R410 Disorientation, unspecified: Secondary | ICD-10-CM | POA: Diagnosis not present

## 2019-09-24 DIAGNOSIS — M2550 Pain in unspecified joint: Secondary | ICD-10-CM | POA: Diagnosis not present

## 2019-09-24 DIAGNOSIS — S99911A Unspecified injury of right ankle, initial encounter: Secondary | ICD-10-CM | POA: Diagnosis not present

## 2019-09-24 DIAGNOSIS — S42331A Displaced oblique fracture of shaft of humerus, right arm, initial encounter for closed fracture: Secondary | ICD-10-CM | POA: Insufficient documentation

## 2019-09-24 DIAGNOSIS — S72141A Displaced intertrochanteric fracture of right femur, initial encounter for closed fracture: Secondary | ICD-10-CM | POA: Diagnosis not present

## 2019-09-24 DIAGNOSIS — I10 Essential (primary) hypertension: Secondary | ICD-10-CM | POA: Diagnosis not present

## 2019-09-24 DIAGNOSIS — I130 Hypertensive heart and chronic kidney disease with heart failure and stage 1 through stage 4 chronic kidney disease, or unspecified chronic kidney disease: Secondary | ICD-10-CM | POA: Diagnosis not present

## 2019-09-24 DIAGNOSIS — Z743 Need for continuous supervision: Secondary | ICD-10-CM | POA: Diagnosis not present

## 2019-09-24 DIAGNOSIS — T1490XA Injury, unspecified, initial encounter: Secondary | ICD-10-CM | POA: Diagnosis not present

## 2019-09-24 DIAGNOSIS — Y33XXXA Other specified events, undetermined intent, initial encounter: Secondary | ICD-10-CM | POA: Diagnosis not present

## 2019-09-24 LAB — CBC WITH DIFFERENTIAL/PLATELET
Abs Immature Granulocytes: 0.08 10*3/uL — ABNORMAL HIGH (ref 0.00–0.07)
Basophils Absolute: 0 10*3/uL (ref 0.0–0.1)
Basophils Relative: 0 %
Eosinophils Absolute: 0.1 10*3/uL (ref 0.0–0.5)
Eosinophils Relative: 1 %
HCT: 36.2 % (ref 36.0–46.0)
Hemoglobin: 12.2 g/dL (ref 12.0–15.0)
Immature Granulocytes: 1 %
Lymphocytes Relative: 7 %
Lymphs Abs: 0.8 10*3/uL (ref 0.7–4.0)
MCH: 28.7 pg (ref 26.0–34.0)
MCHC: 33.7 g/dL (ref 30.0–36.0)
MCV: 85.2 fL (ref 80.0–100.0)
Monocytes Absolute: 0.6 10*3/uL (ref 0.1–1.0)
Monocytes Relative: 5 %
Neutro Abs: 8.9 10*3/uL — ABNORMAL HIGH (ref 1.7–7.7)
Neutrophils Relative %: 86 %
Platelets: 260 10*3/uL (ref 150–400)
RBC: 4.25 MIL/uL (ref 3.87–5.11)
RDW: 14.6 % (ref 11.5–15.5)
WBC: 10.4 10*3/uL (ref 4.0–10.5)
nRBC: 0 % (ref 0.0–0.2)

## 2019-09-24 LAB — URINALYSIS, COMPLETE (UACMP) WITH MICROSCOPIC
Bilirubin Urine: NEGATIVE
Glucose, UA: NEGATIVE mg/dL
Hgb urine dipstick: NEGATIVE
Ketones, ur: NEGATIVE mg/dL
Nitrite: NEGATIVE
Protein, ur: 100 mg/dL — AB
Specific Gravity, Urine: 1.006 (ref 1.005–1.030)
WBC, UA: >50 WBC/hpf — ABNORMAL HIGH (ref 0–5)
pH: 8 (ref 5.0–8.0)

## 2019-09-24 LAB — BASIC METABOLIC PANEL
Anion gap: 15 (ref 5–15)
BUN: 40 mg/dL — ABNORMAL HIGH (ref 8–23)
CO2: 29 mmol/L (ref 22–32)
Calcium: 8.8 mg/dL — ABNORMAL LOW (ref 8.9–10.3)
Chloride: 96 mmol/L — ABNORMAL LOW (ref 98–111)
Creatinine, Ser: 2.03 mg/dL — ABNORMAL HIGH (ref 0.44–1.00)
GFR calc Af Amer: 27 mL/min — ABNORMAL LOW (ref >60)
GFR calc non Af Amer: 24 mL/min — ABNORMAL LOW (ref >60)
Glucose, Bld: 120 mg/dL — ABNORMAL HIGH (ref 70–99)
Potassium: 3.3 mmol/L — ABNORMAL LOW (ref 3.5–5.1)
Sodium: 140 mmol/L (ref 135–145)

## 2019-09-24 LAB — CK: Total CK: 49 U/L (ref 38–234)

## 2019-09-24 MED ORDER — SODIUM CHLORIDE 0.9 % IV SOLN
1.0000 g | Freq: Once | INTRAVENOUS | Status: AC
  Administered 2019-09-24: 1 g via INTRAVENOUS
  Filled 2019-09-24 (×2): qty 10

## 2019-09-24 MED ORDER — FENTANYL CITRATE (PF) 100 MCG/2ML IJ SOLN: 50.0000 ug | Freq: Once | INTRAMUSCULAR | Status: DC

## 2019-09-24 MED ORDER — SODIUM CHLORIDE 0.9 % IV BOLUS
500.0000 mL | Freq: Once | INTRAVENOUS | Status: AC
  Administered 2019-09-24: 19:00:00 500 mL via INTRAVENOUS

## 2019-09-24 NOTE — ED Notes (Signed)
EMTALA and Medical Necessity documentation reviewed at this time and found to be complete per policy. 

## 2019-09-24 NOTE — ED Notes (Signed)
Pts daughter updated on transfer to [facility]. Daughter denies further questions at this time.

## 2019-09-24 NOTE — ED Notes (Signed)
Report to Programme researcher, broadcasting/film/video at [facility] ED

## 2019-09-24 NOTE — ED Notes (Signed)
Bed alarm on.

## 2019-09-24 NOTE — ED Notes (Signed)
Attempted to call legal guardian [name] x2, no answer, will try again later.

## 2019-09-24 NOTE — ED Notes (Signed)
POA aware of transfer to [facility]

## 2019-09-24 NOTE — ED Notes (Signed)
[facility]  Transfer center  [employer]  Per  Dr  Joan Mayans md

## 2019-09-24 NOTE — ED Notes (Signed)
Pt unable to sign for transfer. Verbalized ok

## 2019-09-24 NOTE — ED Notes (Signed)
Pt repeatedly asking RN where she is at, reoriented pt to place and situation/plan of care .

## 2019-09-24 NOTE — ED Triage Notes (Signed)
Pt fell at [facility], fall was unwitnessed. Pt with c/o right arm pain. Denies LOC and hitting head

## 2019-09-24 NOTE — ED Notes (Signed)
Admitting MD to bedside 

## 2019-09-24 NOTE — ED Notes (Signed)
Pt placed in right arm sling. External catheter placed. Pt covered in 1 more warm blanket and heat set to highest temp in room per pt request. Call bell in reach.

## 2019-09-24 NOTE — ED Notes (Signed)
[facility]

## 2019-09-24 NOTE — ED Notes (Signed)
Pt to CT

## 2019-09-24 NOTE — ED Notes (Signed)
Pt states no pain in right leg, but then did c/o pain when this RN placed socks on pts feet

## 2019-09-24 NOTE — ED Provider Notes (Signed)
[facility] Emergency Department Provider Note  ____________________________________________   First MD Initiated Contact with Patient 09/24/19 1416     (approximate)  I have reviewed the triage vital signs and the nursing notes.  History  Chief Complaint Fall    HPI Megan Obrien is a 73 y.o. female with history of heart failure, paroxysmal atrial fibrillation (not on anticoagulation due to recent history of GI bleed requiring transfusion), mitral stenosis, CKD, COVID-19 positive in November who presents from her living facility for an unwitnessed fall.  Per report, patient was found on the ground by the side of her bed today.  Patient does not recall falling.  Unknown downtime.  Patient primarily complains of pain to her right mid arm.  5/10 in severity.  Sharp.  Worsened with movement.  No alleviating factors.  No radiation.  On exam, she also complains of pain to the right distal femur/knee with movement and examination, but otherwise did not originally endorse any pain to the area.  Mild in severity.  Sharp.  No radiation.   Past Medical Hx Past Medical History:  Diagnosis Date  . (HFpEF) heart failure with preserved ejection fraction ([place])    a. 03/2017 Echo: EF 55-60%, no rwma, Gr1 DD, mild to mod MS, mod to sev dil LA; b. 08/2019 Echo: EF 60-65%, PA 48/27(34). Nl RV fxn. Mod dil LA. Mild MR, mod MS. Triv TR. Mild to mod Ao Sclerosis w/o stenosis. Mild PR.  [name] Arthritis   . Carotid arterial disease ([place])    a. 11/2017 CTA Head/Neck: RICA 45, LICA 60.  [name] Cerebrovascular disease    a. 11/2017 MRA: mod to sev stenosis of bilat PCA's P2 segments; b. 11/2017 CTA Head/Neck: RICA 45, LICA 60, sev bilat distal MCA branch stenoses w/ sev bilat P2 stenoses.  . CKD (chronic kidney disease), stage III   . COVID-19 08/2019  . Depression   . GERD (gastroesophageal reflux disease)   . GIB (gastrointestinal bleeding)    a. 08/2019 in setting of INR>10.  .  Hyperlipidemia   . Hypertension   . Left Atrial Appendage Thrombus    a. 11/2017 TEE (performed in setting of stroke/aphasia): nl EF, mod MS, dil LA w/ heavy smoke and small LAA thrombus-->Warfarin initiated.  . Mitral stenosis    a. Mild to moderate by echo 03/2017; b. 03/2017 R/LHC: PCWP 103mmHg, LVEDP 74mmHg. Mean grad of 44mmHg. Valve area of 2.02cm^2; c. 11/2017 TEE: Mod MS.  . Morbid obesity ([place])   . Non-obstructive CAD (coronary artery disease)    a. 03/2017 Cath: mild, non-obstructive CAD.  [name] OSA (obstructive sleep apnea)    a. Previously wore CPAP for several years but stopped on her own and now just uses O2 via [place] @ night.  [name] PAH (pulmonary artery hypertension) ([place])    a. 03/2017 RHC: PA 3mmHg.  [name] Persistent atrial fibrillation ([place])   . Seasonal allergies   . Stroke [facility])    a. 11/2017 - presented w/ aphasia->MRI acute to early subacute ischemia w/in multiple vascular territories, largest in R frontal & L parietal lobes. Smaller foci of ischemia w/in R hippocampus, R parietal lobe, and L peripheral postcentral gyrus.  . Type 2 diabetes mellitus ([place])   . Urinary incontinence     Problem List Patient Active Problem List   Diagnosis Date Noted  . Pressure injury of skin 08/12/2019  . Chronic blood loss anemia 08/10/2019  . Morbid obesity ([place])   . CKD (chronic kidney  disease), stage IV ([place])   . AKI (acute kidney injury) ([place])   . Hemorrhagic shock ([place])   . Acute blood loss anemia   . Coagulopathy ([place])   . Moderate mitral stenosis   . Atrial fibrillation, chronic ([place])   . Acute metabolic encephalopathy   . GI bleed 08/08/2019  . Sepsis ([place]) 01/01/2019  . History of medication noncompliance 05/26/2018  . Vaginal bleeding 02/06/2018  . Expressive aphasia 12/18/2017  . Stroke ([place]) 12/18/2017  . Mitral valve disease   . Exertional dyspnea 04/17/2017  . Fatigue 02/20/2017  . 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 Medicare annual wellness visit, subsequent 07/24/2016  . Hyperlipidemia  02/06/2016  . Essential hypertension 02/06/2016  . Type 2 diabetes mellitus with complication, without long-term current use of insulin ([place]) 02/06/2016  . Depression 02/06/2016  . GERD (gastroesophageal reflux disease) 02/06/2016  . Urge incontinence of urine 02/06/2016    Past Surgical Hx Past Surgical History:  Procedure Laterality Date  . BACK SURGERY    . CARDIAC CATHETERIZATION    . KNEE SURGERY Left   . RIGHT/LEFT HEART CATH AND CORONARY ANGIOGRAPHY Bilateral 04/25/2017   Procedure: Right/Left Heart Cath and Coronary Angiography;  Surgeon: Wellington Hampshire, MD;  Location: [place] [facility] LAB;  Service: Cardiovascular;  Laterality: Bilateral;  . TEE WITHOUT CARDIOVERSION N/A 12/20/2017   Procedure: TRANSESOPHAGEAL ECHOCARDIOGRAM (TEE);  Surgeon: Wellington Hampshire, MD;  Location: [facility] ORS;  Service: Cardiovascular;  Laterality: N/A;  . [place]    Medications Prior to Admission medications   Medication Sig Start Date End Date Taking? Authorizing Provider  acetaminophen (TYLENOL) 650 MG CR tablet Take 650 mg by mouth every 8 (eight) hours as needed for pain.    [provider]  alum & mag hydroxide-simeth (MAALOX/MYLANTA) 200-200-20 MG/5ML suspension Take 10 mLs by mouth every 4 (four) hours as needed for indigestion or heartburn.     [provider]  ammonium lactate (LAC-HYDRIN) 12 % lotion Apply 1 application topically 2 (two) times daily. Apply to feet    [provider]  atorvastatin (LIPITOR) 40 MG tablet Take 1 tablet (40 mg total) by mouth at bedtime. 08/14/18   Pleas Koch, NP  carvedilol (COREG) 12.5 MG tablet Take 1 tablet (12.5 mg total) by mouth 2 (two) times daily with a meal. 08/17/19   Bonnielee Haff, MD  diclofenac sodium (VOLTAREN) 1 % GEL Apply 2 g topically 2 (two) times a day. To both knees    [provider]  docusate sodium (COLACE) 100 MG capsule Take 1 capsule (100 mg total) by mouth 2  (two) times daily. 01/05/19   Hillary Bow, MD  FLUoxetine (PROZAC) 40 MG capsule Take 1 capsule by mouth once daily for depression and anxiety. 11/11/18   Pleas Koch, NP  furosemide (LASIX) 20 MG tablet Take 3 tablets (60 mg total) by mouth daily. 08/17/19   Bonnielee Haff, MD  hydrALAZINE (APRESOLINE) 50 MG tablet Take 2 tablets (100 mg total) by mouth 3 (three) times daily. 08/17/19   Bonnielee Haff, MD  Hydrocortisone (GERHARDT'S BUTT CREAM) CREA Apply in a thin layer after cleansing to perineal area, posterior and medial thighs and buttocks.  Turn from side to side with a pillow between knees to promote air-flow 08/17/19   Bonnielee Haff, MD  Insulin Glargine (LANTUS) 100 UNIT/ML Solostar Pen Inject 15 Units into the skin every evening. Patient taking differently: Inject 17 Units into the skin daily.  04/09/18   Carlis Abbott,  Leticia Penna, NP  loperamide (IMODIUM) 2 MG capsule Take 2 mg by mouth 4 (four) times daily as needed for diarrhea or loose stools.     [provider]  magnesium oxide (MAG-OX) 400 MG tablet Take 400 mg by mouth daily.    [provider]  Melatonin 5 MG TABS Take 5 mg by mouth at bedtime.    [provider]  Multiple Vitamin (MULTIVITAMIN) tablet Take 1 tablet by mouth daily.    [provider]  omega-3 acid ethyl esters (LOVAZA) 1 g capsule TAKE 1 CAPSULE BY MOUTH ONCE DAILY WITH FOOD FOR CHOLESTEROL Patient taking differently: Take 1 g by mouth. TAKE 1 CAPSULE BY MOUTH ONCE DAILY WITH FOOD FOR CHOLESTEROL 09/01/18   Pleas Koch, NP  omeprazole (PRILOSEC) 40 MG capsule Take 1 capsule (40 mg total) by mouth daily. Patient taking differently: Take 40 mg by mouth 2 (two) times a day.  11/13/18   Pleas Koch, NP  rOPINIRole (REQUIP) 0.25 MG tablet Take 0.25 mg by mouth at bedtime.    [provider]  saccharomyces boulardii (FLORASTOR) 250 MG capsule Take 250 mg by mouth 2 (two) times daily.    [provider]   sucralfate (CARAFATE) 1 g tablet Take 1 g by mouth 4 (four) times daily -  with meals and at bedtime.    [provider]  vitamin B-12 (CYANOCOBALAMIN) 250 MCG tablet Take 250 mcg by mouth daily.    [provider]    Allergies Patient has no known allergies.  Family Hx Family History  Problem Relation Age of Onset  . Lung cancer Father   . Alzheimer's disease Mother   . Stroke Maternal Grandfather     Social Hx Social History   Tobacco Use  . Smoking status: Never Smoker  . Smokeless tobacco: Never Used  Substance Use Topics  . Alcohol use: No    Alcohol/week: 0.0 standard drinks    Comment: rarely  . Drug use: No     Review of Systems  Constitutional: Negative for fever, chills. + unwitnessed fall Eyes: Negative for visual changes. ENT: Negative for sore throat. Cardiovascular: Negative for chest pain. Respiratory: Negative for shortness of breath. Gastrointestinal: Negative for nausea, vomiting.  Genitourinary: Negative for dysuria. Musculoskeletal: + RIGHT arm and leg pain Skin: Negative for rash. Neurological: Negative for for headaches.   Physical Exam  Vital Signs: ED Triage Vitals  Enc Vitals Group     BP 09/24/19 1418 (!) 150/85     Pulse Rate 09/24/19 1418 (!) 54     Resp 09/24/19 1418 (!) 22     Temp 09/24/19 1418 98.5 F (36.9 C)     Temp Source 09/24/19 1418 Oral     SpO2 09/24/19 1418 100 %     Weight 09/24/19 1420 270 lb 4.5 oz (122.6 kg)     Height 09/24/19 1420 5\' 6"  (1.676 m)     Head Circumference --      [facility] --      Pain Score 09/24/19 1419 5     Pain Loc --      Pain Edu? --      Excl. in [place]? --     Constitutional: Awake, oriented to self. Able to localize areas of pain but cannot recall falling.  Head: Normocephalic. Atraumatic. Old scar/ecchymosis to R forehead area.  Eyes: Conjunctivae clear. Sclera anicteric. Nose: No congestion. No rhinorrhea. Mouth/Throat: Wearing mask.  Neck: No stridor.  No  midline CS tenderness.  Cardiovascular: HR 50-60s, irregular. Extremities well perfused. 2+ R radial pulse by palpation. Biphasic doppler R DP and PT pulse.  Respiratory: Normal respiratory effort.  Lungs CTAB. Chest: Chest wall stable, NT. No crepitance.  Gastrointestinal: Soft. Non-tender. Non-distended.  Musculoskeletal: Obvious deformity and tenderness to the R proximal/mid humerus area as well as the distal R femur. RLE appears shortened, held in external rotation.  Both extremities are CLOSED, and distally NV in tact. BLE edema. Neurologic:  Normal speech and language. Able to wiggles toes on R. Motor/sensation in tact in R radial, median, ulnar distribution.  Skin: Scattered ecchymosis of various stages to extremities.  Psychiatric: Mood and affect are appropriate for situation.  EKG  Personally reviewed.   Rate: 63 Rhythm: atrial fibrillation Axis: LAD Intervals: prolonged QTc No STEMI    Radiology  CT head: IMPRESSION:  1. No skull fracture or intracranial hemorrhage.  2. Interval old or subacute left occipital lobe infarct.  3. Progressive atrophy and chronic small vessel white matter  ischemic changes.  4. Left inferior mastoid mucosal thickening and chronic sphenoid  sinusitis.   XR RIGHT humerus:  IMPRESSION:  Oblique minimally displaced proximal and mid humeral shaft fracture.   XR RIGHT hip: IMPRESSION:  Lucency in the right femoral intertrochanteric region concerning for  nondisplaced intertrochanteric fracture.   XR RIGHT femur:  IMPRESSION:  Comminuted, displaced distal right femoral metaphyseal fracture.   No definite proximal femoral/intertrochanteric fracture as  questioned on hip series. This could be further evaluated with CT if  felt clinically indicated.   CT hip:  IMPRESSION:  1. Large hematoma in the right gluteus maximus muscle.  2. No fracture or dislocation of the right hip or of the visualized  bones of the right side of the pelvis.  The tiny irregular lucency  seen on radiographs is therefore artifactual.   CXR:  IMPRESSION:  Cardiomegaly. No active disease.   Procedures  Procedure(s) performed (including critical care):  Procedures   Initial Impression / Assessment and Plan / ED Course  73 y.o. female who presents to the ED for an unwitnessed fall, will concern for related trauma to the R arm and R leg.  Will obtain imaging, as well as basic labs given her unknown downtime and to evaluate for any underlying etiology for her fall.   Work-up reveals a proximal mid humeral shaft fracture, as well as a comminuted displaced distal right femur fracture. Both of these injuries are closed, distally NV in tact.   Discussed case with orthopedics here, who recommends transfer for ortho trauma availability.  Discussed with patient's legal guardian, who agrees with plan of care.  Discussed case with [facility], unfortunately they do not have ortho trauma available at this time.  Patient's legal guardian comfortable with attempting transfer to [facility].  Other work-up otherwise reveals mild AKI and UTI.  Will give fluids and antibiotics.  Patient accepted for transfer to [facility], ED to ED.   Final Clinical Impression(s) / ED Diagnosis  Final diagnoses:  Fall, initial encounter  Right arm fracture, closed, initial encounter  Closed fracture of distal end of right femur, unspecified fracture morphology, initial encounter ([place])  AKI (acute kidney injury) ([place])  Urinary tract infection in elderly patient       Note:  This document was prepared using Dragon voice recognition software and may include unintentional dictation errors.   Lilia Pro., MD 09/24/19 2157

## 2019-10-01 MED ORDER — OXYCODONE HCL 5 MG PO TABS
2.50 | ORAL_TABLET | ORAL | Status: DC
Start: ? — End: 2019-10-01

## 2019-10-01 MED ORDER — HYDRALAZINE HCL 25 MG PO TABS
100.00 | ORAL_TABLET | ORAL | Status: DC
Start: 2019-09-30 — End: 2019-10-01

## 2019-10-01 MED ORDER — CARVEDILOL 12.5 MG PO TABS
12.50 | ORAL_TABLET | ORAL | Status: DC
Start: 2019-10-01 — End: 2019-10-01

## 2019-10-01 MED ORDER — NYSTATIN 100000 UNIT/GM EX POWD
CUTANEOUS | Status: DC
Start: 2019-10-01 — End: 2019-10-01

## 2019-10-01 MED ORDER — POLYETHYLENE GLYCOL 3350 17 GM/SCOOP PO POWD
17.00 | ORAL | Status: DC
Start: 2019-10-01 — End: 2019-10-01

## 2019-10-01 MED ORDER — HEPARIN SODIUM (PORCINE) 5000 UNIT/ML IJ SOLN
7500.00 | INTRAMUSCULAR | Status: DC
Start: 2019-09-30 — End: 2019-10-01

## 2019-10-01 MED ORDER — DEXTROSE 50 % IV SOLN
12.50 | INTRAVENOUS | Status: DC
Start: ? — End: 2019-10-01

## 2019-10-01 MED ORDER — ONDANSETRON HCL 4 MG/2ML IJ SOLN
4.00 | INTRAMUSCULAR | Status: DC
Start: ? — End: 2019-10-01

## 2019-10-01 MED ORDER — CYANOCOBALAMIN 500 MCG PO TABS
250.00 | ORAL_TABLET | ORAL | Status: DC
Start: 2019-10-01 — End: 2019-10-01

## 2019-10-01 MED ORDER — ATORVASTATIN CALCIUM 40 MG PO TABS
40.00 | ORAL_TABLET | ORAL | Status: DC
Start: 2019-10-01 — End: 2019-10-01

## 2019-10-01 MED ORDER — MELATONIN 3 MG PO TABS
3.00 | ORAL_TABLET | ORAL | Status: DC
Start: ? — End: 2019-10-01

## 2019-10-01 MED ORDER — GENERIC EXTERNAL MEDICATION
60.00 | Status: DC
Start: 2019-10-01 — End: 2019-10-01

## 2019-10-01 MED ORDER — SENNOSIDES-DOCUSATE SODIUM 8.6-50 MG PO TABS
2.00 | ORAL_TABLET | ORAL | Status: DC
Start: 2019-09-30 — End: 2019-10-01

## 2019-10-01 MED ORDER — MELATONIN 3 MG PO TABS
3.00 | ORAL_TABLET | ORAL | Status: DC
Start: 2019-09-30 — End: 2019-10-01

## 2019-10-01 MED ORDER — INSULIN LISPRO 100 UNIT/ML ~~LOC~~ SOLN
0.00 | SUBCUTANEOUS | Status: DC
Start: 2019-10-01 — End: 2019-10-01

## 2019-10-01 MED ORDER — ROPINIROLE HCL 0.25 MG PO TABS
0.25 | ORAL_TABLET | ORAL | Status: DC
Start: 2019-09-30 — End: 2019-10-01

## 2019-10-01 MED ORDER — SUCRALFATE 1 G PO TABS
1.00 | ORAL_TABLET | ORAL | Status: DC
Start: 2019-09-30 — End: 2019-10-01

## 2019-10-01 MED ORDER — THIAMINE HCL 100 MG PO TABS
100.00 | ORAL_TABLET | ORAL | Status: DC
Start: 2019-10-01 — End: 2019-10-01

## 2019-10-01 MED ORDER — VITAMIN D3 10 MCG (400 UNIT) PO TABS
1600.00 | ORAL_TABLET | ORAL | Status: DC
Start: 2019-10-01 — End: 2019-10-01

## 2019-10-01 MED ORDER — FLUOXETINE HCL 20 MG PO CAPS
40.00 | ORAL_CAPSULE | ORAL | Status: DC
Start: 2019-10-01 — End: 2019-10-01

## 2019-10-01 MED ORDER — GLUCAGON (RDNA) 1 MG IJ KIT
1.00 | PACK | INTRAMUSCULAR | Status: DC
Start: ? — End: 2019-10-01

## 2019-10-01 MED ORDER — QUINTABS PO TABS
1.00 | ORAL_TABLET | ORAL | Status: DC
Start: 2019-10-01 — End: 2019-10-01

## 2019-10-01 MED ORDER — ACETAMINOPHEN 325 MG PO TABS
650.00 | ORAL_TABLET | ORAL | Status: DC
Start: 2019-09-30 — End: 2019-10-01

## 2019-10-01 MED ORDER — CALCIUM CARBONATE-VITAMIN D 600-400 MG-UNIT PO TABS
1.00 | ORAL_TABLET | ORAL | Status: DC
Start: 2019-10-01 — End: 2019-10-01

## 2019-10-01 MED ORDER — PANTOPRAZOLE SODIUM 40 MG PO TBEC
40.00 | DELAYED_RELEASE_TABLET | ORAL | Status: DC
Start: 2019-10-01 — End: 2019-10-01

## 2019-10-02 DIAGNOSIS — M79671 Pain in right foot: Secondary | ICD-10-CM | POA: Diagnosis not present

## 2019-10-02 DIAGNOSIS — S72491A Other fracture of lower end of right femur, initial encounter for closed fracture: Secondary | ICD-10-CM | POA: Diagnosis not present

## 2019-10-03 DIAGNOSIS — K219 Gastro-esophageal reflux disease without esophagitis: Secondary | ICD-10-CM | POA: Diagnosis not present

## 2019-10-03 DIAGNOSIS — Z4781 Encounter for orthopedic aftercare following surgical amputation: Secondary | ICD-10-CM | POA: Diagnosis not present

## 2019-10-03 DIAGNOSIS — R262 Difficulty in walking, not elsewhere classified: Secondary | ICD-10-CM | POA: Diagnosis not present

## 2019-10-03 DIAGNOSIS — Z79899 Other long term (current) drug therapy: Secondary | ICD-10-CM | POA: Diagnosis not present

## 2019-10-03 DIAGNOSIS — R1312 Dysphagia, oropharyngeal phase: Secondary | ICD-10-CM | POA: Diagnosis not present

## 2019-10-03 DIAGNOSIS — R498 Other voice and resonance disorders: Secondary | ICD-10-CM | POA: Diagnosis not present

## 2019-10-03 DIAGNOSIS — E119 Type 2 diabetes mellitus without complications: Secondary | ICD-10-CM | POA: Diagnosis not present

## 2019-10-03 DIAGNOSIS — E785 Hyperlipidemia, unspecified: Secondary | ICD-10-CM | POA: Diagnosis not present

## 2019-10-03 DIAGNOSIS — K5909 Other constipation: Secondary | ICD-10-CM | POA: Diagnosis not present

## 2019-10-03 DIAGNOSIS — I10 Essential (primary) hypertension: Secondary | ICD-10-CM | POA: Diagnosis not present

## 2019-10-04 DIAGNOSIS — E119 Type 2 diabetes mellitus without complications: Secondary | ICD-10-CM | POA: Diagnosis not present

## 2019-10-04 DIAGNOSIS — S72491D Other fracture of lower end of right femur, subsequent encounter for closed fracture with routine healing: Secondary | ICD-10-CM | POA: Diagnosis not present

## 2019-10-04 DIAGNOSIS — Z8673 Personal history of transient ischemic attack (TIA), and cerebral infarction without residual deficits: Secondary | ICD-10-CM | POA: Diagnosis not present

## 2019-10-04 DIAGNOSIS — S42291D Other displaced fracture of upper end of right humerus, subsequent encounter for fracture with routine healing: Secondary | ICD-10-CM | POA: Diagnosis not present

## 2019-10-04 DIAGNOSIS — S82831D Other fracture of upper and lower end of right fibula, subsequent encounter for closed fracture with routine healing: Secondary | ICD-10-CM | POA: Diagnosis not present

## 2019-10-05 DIAGNOSIS — D649 Anemia, unspecified: Secondary | ICD-10-CM | POA: Diagnosis not present

## 2019-10-05 DIAGNOSIS — Z4781 Encounter for orthopedic aftercare following surgical amputation: Secondary | ICD-10-CM | POA: Diagnosis not present

## 2019-10-05 DIAGNOSIS — K219 Gastro-esophageal reflux disease without esophagitis: Secondary | ICD-10-CM | POA: Diagnosis not present

## 2019-10-05 DIAGNOSIS — R1312 Dysphagia, oropharyngeal phase: Secondary | ICD-10-CM | POA: Diagnosis not present

## 2019-10-05 DIAGNOSIS — I10 Essential (primary) hypertension: Secondary | ICD-10-CM | POA: Diagnosis not present

## 2019-10-05 DIAGNOSIS — E785 Hyperlipidemia, unspecified: Secondary | ICD-10-CM | POA: Diagnosis not present

## 2019-10-05 DIAGNOSIS — R319 Hematuria, unspecified: Secondary | ICD-10-CM | POA: Diagnosis not present

## 2019-10-05 DIAGNOSIS — R262 Difficulty in walking, not elsewhere classified: Secondary | ICD-10-CM | POA: Diagnosis not present

## 2019-10-05 DIAGNOSIS — K5909 Other constipation: Secondary | ICD-10-CM | POA: Diagnosis not present

## 2019-10-05 DIAGNOSIS — E119 Type 2 diabetes mellitus without complications: Secondary | ICD-10-CM | POA: Diagnosis not present

## 2019-10-05 DIAGNOSIS — R498 Other voice and resonance disorders: Secondary | ICD-10-CM | POA: Diagnosis not present

## 2019-10-06 ENCOUNTER — Telehealth: Payer: Self-pay | Admitting: Primary Care

## 2019-10-06 DIAGNOSIS — E119 Type 2 diabetes mellitus without complications: Secondary | ICD-10-CM | POA: Diagnosis not present

## 2019-10-06 DIAGNOSIS — E785 Hyperlipidemia, unspecified: Secondary | ICD-10-CM | POA: Diagnosis not present

## 2019-10-06 DIAGNOSIS — R262 Difficulty in walking, not elsewhere classified: Secondary | ICD-10-CM | POA: Diagnosis not present

## 2019-10-06 DIAGNOSIS — I10 Essential (primary) hypertension: Secondary | ICD-10-CM | POA: Diagnosis not present

## 2019-10-06 DIAGNOSIS — Z4781 Encounter for orthopedic aftercare following surgical amputation: Secondary | ICD-10-CM | POA: Diagnosis not present

## 2019-10-06 DIAGNOSIS — K219 Gastro-esophageal reflux disease without esophagitis: Secondary | ICD-10-CM | POA: Diagnosis not present

## 2019-10-06 DIAGNOSIS — R1312 Dysphagia, oropharyngeal phase: Secondary | ICD-10-CM | POA: Diagnosis not present

## 2019-10-06 DIAGNOSIS — K5909 Other constipation: Secondary | ICD-10-CM | POA: Diagnosis not present

## 2019-10-06 DIAGNOSIS — R498 Other voice and resonance disorders: Secondary | ICD-10-CM | POA: Diagnosis not present

## 2019-10-06 NOTE — Telephone Encounter (Signed)
I received a call last pm from patient's son. He wanted some information about his mothers palliative care admission. I discussed the role of palliative care nurse practitioners and what my role at [facility] was. He asked if I could update him after my visits with his mother.His sister is currently power of attorney. I will speak with POA after next visit and follow up as indicated.

## 2019-10-07 DIAGNOSIS — R1312 Dysphagia, oropharyngeal phase: Secondary | ICD-10-CM | POA: Diagnosis not present

## 2019-10-07 DIAGNOSIS — Z4781 Encounter for orthopedic aftercare following surgical amputation: Secondary | ICD-10-CM | POA: Diagnosis not present

## 2019-10-07 DIAGNOSIS — E119 Type 2 diabetes mellitus without complications: Secondary | ICD-10-CM | POA: Diagnosis not present

## 2019-10-07 DIAGNOSIS — E785 Hyperlipidemia, unspecified: Secondary | ICD-10-CM | POA: Diagnosis not present

## 2019-10-07 DIAGNOSIS — R262 Difficulty in walking, not elsewhere classified: Secondary | ICD-10-CM | POA: Diagnosis not present

## 2019-10-07 DIAGNOSIS — I10 Essential (primary) hypertension: Secondary | ICD-10-CM | POA: Diagnosis not present

## 2019-10-07 DIAGNOSIS — R498 Other voice and resonance disorders: Secondary | ICD-10-CM | POA: Diagnosis not present

## 2019-10-07 DIAGNOSIS — K219 Gastro-esophageal reflux disease without esophagitis: Secondary | ICD-10-CM | POA: Diagnosis not present

## 2019-10-07 DIAGNOSIS — K5909 Other constipation: Secondary | ICD-10-CM | POA: Diagnosis not present

## 2019-10-08 DIAGNOSIS — D62 Acute posthemorrhagic anemia: Secondary | ICD-10-CM | POA: Diagnosis not present

## 2019-10-08 DIAGNOSIS — R498 Other voice and resonance disorders: Secondary | ICD-10-CM | POA: Diagnosis not present

## 2019-10-08 DIAGNOSIS — Z4781 Encounter for orthopedic aftercare following surgical amputation: Secondary | ICD-10-CM | POA: Diagnosis not present

## 2019-10-08 DIAGNOSIS — E119 Type 2 diabetes mellitus without complications: Secondary | ICD-10-CM | POA: Diagnosis not present

## 2019-10-08 DIAGNOSIS — R262 Difficulty in walking, not elsewhere classified: Secondary | ICD-10-CM | POA: Diagnosis not present

## 2019-10-08 DIAGNOSIS — I10 Essential (primary) hypertension: Secondary | ICD-10-CM | POA: Diagnosis not present

## 2019-10-08 DIAGNOSIS — K219 Gastro-esophageal reflux disease without esophagitis: Secondary | ICD-10-CM | POA: Diagnosis not present

## 2019-10-08 DIAGNOSIS — K5909 Other constipation: Secondary | ICD-10-CM | POA: Diagnosis not present

## 2019-10-08 DIAGNOSIS — I4819 Other persistent atrial fibrillation: Secondary | ICD-10-CM | POA: Diagnosis not present

## 2019-10-08 DIAGNOSIS — E785 Hyperlipidemia, unspecified: Secondary | ICD-10-CM | POA: Diagnosis not present

## 2019-10-08 DIAGNOSIS — R1312 Dysphagia, oropharyngeal phase: Secondary | ICD-10-CM | POA: Diagnosis not present

## 2019-10-10 DIAGNOSIS — D508 Other iron deficiency anemias: Secondary | ICD-10-CM | POA: Diagnosis not present

## 2019-10-10 DIAGNOSIS — D649 Anemia, unspecified: Secondary | ICD-10-CM | POA: Diagnosis not present

## 2019-10-12 DIAGNOSIS — M47814 Spondylosis without myelopathy or radiculopathy, thoracic region: Secondary | ICD-10-CM | POA: Diagnosis not present

## 2019-10-12 DIAGNOSIS — K5909 Other constipation: Secondary | ICD-10-CM | POA: Diagnosis not present

## 2019-10-12 DIAGNOSIS — Y33XXXD Other specified events, undetermined intent, subsequent encounter: Secondary | ICD-10-CM | POA: Diagnosis not present

## 2019-10-12 DIAGNOSIS — S92141D Displaced dome fracture of right talus, subsequent encounter for fracture with routine healing: Secondary | ICD-10-CM | POA: Diagnosis not present

## 2019-10-12 DIAGNOSIS — M1711 Unilateral primary osteoarthritis, right knee: Secondary | ICD-10-CM | POA: Diagnosis not present

## 2019-10-12 DIAGNOSIS — S42291A Other displaced fracture of upper end of right humerus, initial encounter for closed fracture: Secondary | ICD-10-CM | POA: Diagnosis not present

## 2019-10-12 DIAGNOSIS — M19011 Primary osteoarthritis, right shoulder: Secondary | ICD-10-CM | POA: Diagnosis not present

## 2019-10-12 DIAGNOSIS — S72491A Other fracture of lower end of right femur, initial encounter for closed fracture: Secondary | ICD-10-CM | POA: Diagnosis not present

## 2019-10-12 DIAGNOSIS — R1312 Dysphagia, oropharyngeal phase: Secondary | ICD-10-CM | POA: Diagnosis not present

## 2019-10-12 DIAGNOSIS — I10 Essential (primary) hypertension: Secondary | ICD-10-CM | POA: Diagnosis not present

## 2019-10-12 DIAGNOSIS — R262 Difficulty in walking, not elsewhere classified: Secondary | ICD-10-CM | POA: Diagnosis not present

## 2019-10-12 DIAGNOSIS — K219 Gastro-esophageal reflux disease without esophagitis: Secondary | ICD-10-CM | POA: Diagnosis not present

## 2019-10-12 DIAGNOSIS — E119 Type 2 diabetes mellitus without complications: Secondary | ICD-10-CM | POA: Diagnosis not present

## 2019-10-12 DIAGNOSIS — M84421S Pathological fracture, right humerus, sequela: Secondary | ICD-10-CM | POA: Diagnosis not present

## 2019-10-12 DIAGNOSIS — R498 Other voice and resonance disorders: Secondary | ICD-10-CM | POA: Diagnosis not present

## 2019-10-12 DIAGNOSIS — E785 Hyperlipidemia, unspecified: Secondary | ICD-10-CM | POA: Diagnosis not present

## 2019-10-12 DIAGNOSIS — S42201A Unspecified fracture of upper end of right humerus, initial encounter for closed fracture: Secondary | ICD-10-CM | POA: Diagnosis not present

## 2019-10-12 DIAGNOSIS — S82831A Other fracture of upper and lower end of right fibula, initial encounter for closed fracture: Secondary | ICD-10-CM | POA: Diagnosis not present

## 2019-10-12 DIAGNOSIS — W1839XA Other fall on same level, initial encounter: Secondary | ICD-10-CM | POA: Diagnosis not present

## 2019-10-12 DIAGNOSIS — M85851 Other specified disorders of bone density and structure, right thigh: Secondary | ICD-10-CM | POA: Diagnosis not present

## 2019-10-12 DIAGNOSIS — M1611 Unilateral primary osteoarthritis, right hip: Secondary | ICD-10-CM | POA: Diagnosis not present

## 2019-10-12 DIAGNOSIS — Z4781 Encounter for orthopedic aftercare following surgical amputation: Secondary | ICD-10-CM | POA: Diagnosis not present

## 2019-10-12 DIAGNOSIS — M25571 Pain in right ankle and joints of right foot: Secondary | ICD-10-CM | POA: Diagnosis not present

## 2019-10-13 DIAGNOSIS — R498 Other voice and resonance disorders: Secondary | ICD-10-CM | POA: Diagnosis not present

## 2019-10-13 DIAGNOSIS — K5909 Other constipation: Secondary | ICD-10-CM | POA: Diagnosis not present

## 2019-10-13 DIAGNOSIS — Z4781 Encounter for orthopedic aftercare following surgical amputation: Secondary | ICD-10-CM | POA: Diagnosis not present

## 2019-10-13 DIAGNOSIS — R262 Difficulty in walking, not elsewhere classified: Secondary | ICD-10-CM | POA: Diagnosis not present

## 2019-10-13 DIAGNOSIS — K219 Gastro-esophageal reflux disease without esophagitis: Secondary | ICD-10-CM | POA: Diagnosis not present

## 2019-10-13 DIAGNOSIS — E785 Hyperlipidemia, unspecified: Secondary | ICD-10-CM | POA: Diagnosis not present

## 2019-10-13 DIAGNOSIS — R1312 Dysphagia, oropharyngeal phase: Secondary | ICD-10-CM | POA: Diagnosis not present

## 2019-10-13 DIAGNOSIS — I10 Essential (primary) hypertension: Secondary | ICD-10-CM | POA: Diagnosis not present

## 2019-10-13 DIAGNOSIS — E119 Type 2 diabetes mellitus without complications: Secondary | ICD-10-CM | POA: Diagnosis not present

## 2019-10-13 DIAGNOSIS — Z1159 Encounter for screening for other viral diseases: Secondary | ICD-10-CM | POA: Diagnosis not present

## 2019-10-14 ENCOUNTER — Other Ambulatory Visit: Payer: Self-pay

## 2019-10-14 ENCOUNTER — Non-Acute Institutional Stay: Payer: Medicare Other | Admitting: Primary Care

## 2019-10-14 DIAGNOSIS — K219 Gastro-esophageal reflux disease without esophagitis: Secondary | ICD-10-CM | POA: Diagnosis not present

## 2019-10-14 DIAGNOSIS — Z515 Encounter for palliative care: Secondary | ICD-10-CM | POA: Diagnosis not present

## 2019-10-14 DIAGNOSIS — D519 Vitamin B12 deficiency anemia, unspecified: Secondary | ICD-10-CM | POA: Diagnosis not present

## 2019-10-14 DIAGNOSIS — E785 Hyperlipidemia, unspecified: Secondary | ICD-10-CM | POA: Diagnosis not present

## 2019-10-14 DIAGNOSIS — E119 Type 2 diabetes mellitus without complications: Secondary | ICD-10-CM | POA: Diagnosis not present

## 2019-10-14 DIAGNOSIS — K5909 Other constipation: Secondary | ICD-10-CM | POA: Diagnosis not present

## 2019-10-14 DIAGNOSIS — I10 Essential (primary) hypertension: Secondary | ICD-10-CM | POA: Diagnosis not present

## 2019-10-14 DIAGNOSIS — Z4781 Encounter for orthopedic aftercare following surgical amputation: Secondary | ICD-10-CM | POA: Diagnosis not present

## 2019-10-14 DIAGNOSIS — R1312 Dysphagia, oropharyngeal phase: Secondary | ICD-10-CM | POA: Diagnosis not present

## 2019-10-14 DIAGNOSIS — R262 Difficulty in walking, not elsewhere classified: Secondary | ICD-10-CM | POA: Diagnosis not present

## 2019-10-14 DIAGNOSIS — D649 Anemia, unspecified: Secondary | ICD-10-CM | POA: Diagnosis not present

## 2019-10-14 DIAGNOSIS — R498 Other voice and resonance disorders: Secondary | ICD-10-CM | POA: Diagnosis not present

## 2019-10-14 DIAGNOSIS — D509 Iron deficiency anemia, unspecified: Secondary | ICD-10-CM | POA: Diagnosis not present

## 2019-10-14 NOTE — Progress Notes (Signed)
[facility] Palliative Care Consult Note Telephone: [phone]  Fax: [phone]  TELEHEALTH VISIT STATEMENT Due to the COVID-19 crisis, this visit was done via telemedicine from my office. It was initiated and consented to by this patient and/or family.  PATIENT NAME: Obrien Obrien [place] [address] [phone] (home)  DOB: Jul 23, 1946 MRN: [id number]  PRIMARY CARE PROVIDER:   [name], MD, [address] [place] [address] [phone]  REFERRING PROVIDER:  [name], MD [address] [place],  [place] [address] [phone]  RESPONSIBLE PARTY:   Extended Emergency Contact Information Primary Emergency Contact: [place] of [place] Mobile Phone: [phone] Relation: Daughter Secondary Emergency Contact: Jennette Kettle, [place] of [place] Phone: [phone] Relation: Son                      ASSESSMENT AND RECOMMENDATIONS:   1. Advance Care Planning/Goals of Care: Goals include to maximize quality of life and symptom management. Daughter mailed in an advance directive and requests DNR, per staff. I called POA Ms Marianna Payment and discussed mother's status. She has had many significant illnesses this autumn to include covid infection, falls and fractures, worsening cognitive function. She has significant underlying co morbidities e.g. CKD3 and a fib. She has had a gi bleed ostensibly related to anti coagulation and has had 6 units transfused in the past several months. These issues are taking a significant toll cumutively. Daughter voices she'd been told her mother may be approaching hospice eligibility by a physician at [facility]. I concurred this would be possible given her multiple areas of decline.   We will continue to discuss goals of care in light of this syndrome of decline.  2. Symptom Management:   Pain: Currently on Oxycodone 2.5 mg qid and 5 mg q 4 h prn. Pain not  well controlled, recommend ATC acetaminophen instead of prn for better pain control in addition to oxycodone scheduled and prn. States she is afraid to move due to pain. Specific to arm and leg fx sites.Sarmiento brace on arm. Had been in sling prior.  Anemia: Patient has had gi bleed recently and received 6 units of blood. Now h /h = 10/05/19 27.6 and hgb 9.2.  Recent H and H improved.  Daughter was concerned if anticoagulation has been restarted and voices she may choose to d/c that. It is my understanding she's been stopped of all anticoag. rx.  Nutrition: Recommend nutritional supplements. Loss of 4 % in 30 days per SNF record. Poor intake, Wt 236.4 lbs, 09/14/19 = 256 lbs. Edema was significant to abdomen, and has lost this. Weight could be fluid and now has 2+ to knees. 59 BMI on 12/3 recorded. TIBC 147, Transferrin 55.99, 616 Ferritin, H/H 9.6 and 29.9. Albumin 2.8, stage 3 CKD.  Is adding house pudding, shakes, prostat and liberalizing diet.Recommend remeron.  Therapy: Not taking therapy currently and has been non weight bearing. New orders have been received for PT to evaluate.  3. Family /Caregiver/Community Supports:  Daughter and son are involved in care. Lives in [place].  4. Cognitive / Functional decline: Declining in mentation and function. Had been oriented and conversant before events of past 3 months (covid infection, falls and fractures). Now oriented to self only and forgetful. Not able to do IADLS. Unable to do most adls. Can feed self with set up.  5. Follow up Palliative  Care Visit: Palliative care will continue to follow for goals of care clarification and symptom management. Return 2-3 weeks or prn.  I spent 35 minutes providing this consultation,  from 1400 to 1435. More than 50% of the time in this consultation was spent coordinating communication.   HISTORY OF PRESENT ILLNESS:  Obrien Obrien is a 74 y.o. year old female with multiple medical problems including protein  calorie malnutrition, recent covid 19 infection, recent fx of right femur and humerus, CAD, CVD, PAH, increasing cognitive impairment. Palliative Care was asked to follow this patient by consultation request of [name], MD to help address advance care planning and goals of care. This is a follow up visit.  CODE STATUS: DNR  PPS: 30% HOSPICE ELIGIBILITY/DIAGNOSIS: Yes, protein calorie malnutrition, CAD, PAH, post covid infection  PAST MEDICAL HISTORY:  Past Medical History:  Diagnosis Date  . (HFpEF) heart failure with preserved ejection fraction ([place])    a. 03/2017 Echo: EF 55-60%, no rwma, Gr1 DD, mild to mod MS, mod to sev dil LA; b. 08/2019 Echo: EF 60-65%, PA 48/27(34). Nl RV fxn. Mod dil LA. Mild MR, mod MS. Triv TR. Mild to mod Ao Sclerosis w/o stenosis. Mild PR.  [name] Arthritis   . Carotid arterial disease ([place])    a. 11/2017 CTA Head/Neck: RICA 45, LICA 60.  [name] Cerebrovascular disease    a. 11/2017 MRA: mod to sev stenosis of bilat PCA's P2 segments; b. 11/2017 CTA Head/Neck: RICA 45, LICA 60, sev bilat distal MCA branch stenoses w/ sev bilat P2 stenoses.  . CKD (chronic kidney disease), stage III   . COVID-19 08/2019  . Depression   . GERD (gastroesophageal reflux disease)   . GIB (gastrointestinal bleeding)    a. 08/2019 in setting of INR>10.  . Hyperlipidemia   . Hypertension   . Left Atrial Appendage Thrombus    a. 11/2017 TEE (performed in setting of stroke/aphasia): nl EF, mod MS, dil LA w/ heavy smoke and small LAA thrombus-->Warfarin initiated.  . Mitral stenosis    a. Mild to moderate by echo 03/2017; b. 03/2017 R/LHC: PCWP 69mmHg, LVEDP 35mmHg. Mean grad of 41mmHg. Valve area of 2.02cm^2; c. 11/2017 TEE: Mod MS.  . Morbid obesity ([place])   . Non-obstructive CAD (coronary artery disease)    a. 03/2017 Cath: mild, non-obstructive CAD.  [name] OSA (obstructive sleep apnea)    a. Previously wore CPAP for several years but stopped on her own and now just uses O2 via [place] @ night.  [name]  PAH (pulmonary artery hypertension) ([place])    a. 03/2017 RHC: PA 59mmHg.  [name] Persistent atrial fibrillation ([place])   . Seasonal allergies   . Stroke [facility])    a. 11/2017 - presented w/ aphasia->MRI acute to early subacute ischemia w/in multiple vascular territories, largest in R frontal & L parietal lobes. Smaller foci of ischemia w/in R hippocampus, R parietal lobe, and L peripheral postcentral gyrus.  . Type 2 diabetes mellitus ([place])   . Urinary incontinence     SOCIAL HX:  Social History   Tobacco Use  . Smoking status: Never Smoker  . Smokeless tobacco: Never Used  Substance Use Topics  . Alcohol use: No    Alcohol/week: 0.0 standard drinks    Comment: rarely    ALLERGIES: No Known Allergies   PERTINENT MEDICATIONS:  Outpatient Encounter Medications as of 10/14/2019  Medication Sig  . acetaminophen (TYLENOL) 650 MG CR tablet Take 650 mg by mouth every 8 (eight) hours  as needed for pain.  [name] alum & mag hydroxide-simeth (MAALOX/MYLANTA) 200-200-20 MG/5ML suspension Take 10 mLs by mouth every 4 (four) hours as needed for indigestion or heartburn.   [name] ammonium lactate (LAC-HYDRIN) 12 % lotion Apply 1 application topically 2 (two) times daily. Apply to feet  . atorvastatin (LIPITOR) 40 MG tablet Take 1 tablet (40 mg total) by mouth at bedtime.  . carvedilol (COREG) 12.5 MG tablet Take 1 tablet (12.5 mg total) by mouth 2 (two) times daily with a meal.  . diclofenac sodium (VOLTAREN) 1 % GEL Apply 2 g topically 2 (two) times a day. To both knees  . docusate sodium (COLACE) 100 MG capsule Take 1 capsule (100 mg total) by mouth 2 (two) times daily.  [name] FLUoxetine (PROZAC) 40 MG capsule Take 1 capsule by mouth once daily for depression and anxiety.  . furosemide (LASIX) 20 MG tablet Take 3 tablets (60 mg total) by mouth daily.  . hydrALAZINE (APRESOLINE) 50 MG tablet Take 2 tablets (100 mg total) by mouth 3 (three) times daily.  . Hydrocortisone (GERHARDT'S BUTT CREAM) CREA Apply in a thin layer  after cleansing to perineal area, posterior and medial thighs and buttocks.  Turn from side to side with a pillow between knees to promote air-flow  . Insulin Glargine (LANTUS) 100 UNIT/ML Solostar Pen Inject 15 Units into the skin every evening. (Patient taking differently: Inject 15 Units into the skin daily. )  . loperamide (IMODIUM) 2 MG capsule Take 2 mg by mouth 4 (four) times daily as needed for diarrhea or loose stools.   . magnesium oxide (MAG-OX) 400 MG tablet Take 400 mg by mouth daily.  . Melatonin 5 MG TABS Take 5 mg by mouth at bedtime.  . Multiple Vitamin (MULTIVITAMIN) tablet Take 1 tablet by mouth daily.  [name] omega-3 acid ethyl esters (LOVAZA) 1 g capsule TAKE 1 CAPSULE BY MOUTH ONCE DAILY WITH FOOD FOR CHOLESTEROL (Patient taking differently: Take 1 g by mouth. TAKE 1 CAPSULE BY MOUTH ONCE DAILY WITH FOOD FOR CHOLESTEROL)  . omeprazole (PRILOSEC) 40 MG capsule Take 1 capsule (40 mg total) by mouth daily.  . ondansetron (ZOFRAN) 4 MG tablet Take 4 mg by mouth every 8 (eight) hours as needed for nausea or vomiting.  [name] rOPINIRole (REQUIP) 0.25 MG tablet Take 0.25 mg by mouth at bedtime.  . saccharomyces boulardii (FLORASTOR) 250 MG capsule Take 250 mg by mouth 2 (two) times daily.  . sucralfate (CARAFATE) 1 g tablet Take 1 g by mouth 4 (four) times daily -  with meals and at bedtime.  . vitamin B-12 (CYANOCOBALAMIN) 250 MCG tablet Take 250 mcg by mouth daily.   No facility-administered encounter medications on file as of 10/14/2019.    PHYSICAL EXAM / ROS:   Current and past weights: Wt 236.4 lbs, 09/14/19 = 256 lbs. General: NAD, frail appearing,obese, Endorses moderate persistent pain In fx sites Cardiovascular: no chest pain reported, edema 2+ to knees Pulmonary: no cough, no increased SOB, oxygen use  Abdomen: appetite fair, denies constipation, incontinent of bowel GU: denies dysuria, incontinent of urine MSK:  no joint deformities, non-ambulatory, had ORIF R femur and now  has brace for R humerus Skin:  Superficial abrasion from immobilizer, healing Neurological: Weakness, memory loss which is progressing. She states she wants to block out memories but cannot remember event of the day, where she is, date.  Jason Coop, NP [facility]

## 2019-10-15 DIAGNOSIS — S72001D Fracture of unspecified part of neck of right femur, subsequent encounter for closed fracture with routine healing: Secondary | ICD-10-CM | POA: Diagnosis not present

## 2019-10-15 DIAGNOSIS — K5909 Other constipation: Secondary | ICD-10-CM | POA: Diagnosis not present

## 2019-10-15 DIAGNOSIS — R262 Difficulty in walking, not elsewhere classified: Secondary | ICD-10-CM | POA: Diagnosis not present

## 2019-10-15 DIAGNOSIS — Z4781 Encounter for orthopedic aftercare following surgical amputation: Secondary | ICD-10-CM | POA: Diagnosis not present

## 2019-10-15 DIAGNOSIS — E119 Type 2 diabetes mellitus without complications: Secondary | ICD-10-CM | POA: Diagnosis not present

## 2019-10-15 DIAGNOSIS — K219 Gastro-esophageal reflux disease without esophagitis: Secondary | ICD-10-CM | POA: Diagnosis not present

## 2019-10-15 DIAGNOSIS — N189 Chronic kidney disease, unspecified: Secondary | ICD-10-CM | POA: Diagnosis not present

## 2019-10-15 DIAGNOSIS — I10 Essential (primary) hypertension: Secondary | ICD-10-CM | POA: Diagnosis not present

## 2019-10-15 DIAGNOSIS — Z8673 Personal history of transient ischemic attack (TIA), and cerebral infarction without residual deficits: Secondary | ICD-10-CM | POA: Diagnosis not present

## 2019-10-15 DIAGNOSIS — R1312 Dysphagia, oropharyngeal phase: Secondary | ICD-10-CM | POA: Diagnosis not present

## 2019-10-15 DIAGNOSIS — R498 Other voice and resonance disorders: Secondary | ICD-10-CM | POA: Diagnosis not present

## 2019-10-15 DIAGNOSIS — E785 Hyperlipidemia, unspecified: Secondary | ICD-10-CM | POA: Diagnosis not present

## 2019-10-16 DIAGNOSIS — K219 Gastro-esophageal reflux disease without esophagitis: Secondary | ICD-10-CM | POA: Diagnosis not present

## 2019-10-16 DIAGNOSIS — I10 Essential (primary) hypertension: Secondary | ICD-10-CM | POA: Diagnosis not present

## 2019-10-16 DIAGNOSIS — Z4781 Encounter for orthopedic aftercare following surgical amputation: Secondary | ICD-10-CM | POA: Diagnosis not present

## 2019-10-16 DIAGNOSIS — R498 Other voice and resonance disorders: Secondary | ICD-10-CM | POA: Diagnosis not present

## 2019-10-16 DIAGNOSIS — K5909 Other constipation: Secondary | ICD-10-CM | POA: Diagnosis not present

## 2019-10-16 DIAGNOSIS — R1312 Dysphagia, oropharyngeal phase: Secondary | ICD-10-CM | POA: Diagnosis not present

## 2019-10-16 DIAGNOSIS — E785 Hyperlipidemia, unspecified: Secondary | ICD-10-CM | POA: Diagnosis not present

## 2019-10-16 DIAGNOSIS — E119 Type 2 diabetes mellitus without complications: Secondary | ICD-10-CM | POA: Diagnosis not present

## 2019-10-16 DIAGNOSIS — R262 Difficulty in walking, not elsewhere classified: Secondary | ICD-10-CM | POA: Diagnosis not present

## 2019-10-19 DIAGNOSIS — E785 Hyperlipidemia, unspecified: Secondary | ICD-10-CM | POA: Diagnosis not present

## 2019-10-19 DIAGNOSIS — R498 Other voice and resonance disorders: Secondary | ICD-10-CM | POA: Diagnosis not present

## 2019-10-19 DIAGNOSIS — R262 Difficulty in walking, not elsewhere classified: Secondary | ICD-10-CM | POA: Diagnosis not present

## 2019-10-19 DIAGNOSIS — K219 Gastro-esophageal reflux disease without esophagitis: Secondary | ICD-10-CM | POA: Diagnosis not present

## 2019-10-19 DIAGNOSIS — E119 Type 2 diabetes mellitus without complications: Secondary | ICD-10-CM | POA: Diagnosis not present

## 2019-10-19 DIAGNOSIS — R1312 Dysphagia, oropharyngeal phase: Secondary | ICD-10-CM | POA: Diagnosis not present

## 2019-10-19 DIAGNOSIS — Z4781 Encounter for orthopedic aftercare following surgical amputation: Secondary | ICD-10-CM | POA: Diagnosis not present

## 2019-10-19 DIAGNOSIS — I10 Essential (primary) hypertension: Secondary | ICD-10-CM | POA: Diagnosis not present

## 2019-10-19 DIAGNOSIS — K5909 Other constipation: Secondary | ICD-10-CM | POA: Diagnosis not present

## 2019-10-20 DIAGNOSIS — R498 Other voice and resonance disorders: Secondary | ICD-10-CM | POA: Diagnosis not present

## 2019-10-20 DIAGNOSIS — Z1159 Encounter for screening for other viral diseases: Secondary | ICD-10-CM | POA: Diagnosis not present

## 2019-10-20 DIAGNOSIS — I10 Essential (primary) hypertension: Secondary | ICD-10-CM | POA: Diagnosis not present

## 2019-10-20 DIAGNOSIS — R262 Difficulty in walking, not elsewhere classified: Secondary | ICD-10-CM | POA: Diagnosis not present

## 2019-10-20 DIAGNOSIS — R1312 Dysphagia, oropharyngeal phase: Secondary | ICD-10-CM | POA: Diagnosis not present

## 2019-10-20 DIAGNOSIS — Z4781 Encounter for orthopedic aftercare following surgical amputation: Secondary | ICD-10-CM | POA: Diagnosis not present

## 2019-10-20 DIAGNOSIS — K5909 Other constipation: Secondary | ICD-10-CM | POA: Diagnosis not present

## 2019-10-20 DIAGNOSIS — K219 Gastro-esophageal reflux disease without esophagitis: Secondary | ICD-10-CM | POA: Diagnosis not present

## 2019-10-20 DIAGNOSIS — E119 Type 2 diabetes mellitus without complications: Secondary | ICD-10-CM | POA: Diagnosis not present

## 2019-10-20 DIAGNOSIS — E785 Hyperlipidemia, unspecified: Secondary | ICD-10-CM | POA: Diagnosis not present

## 2019-10-21 DIAGNOSIS — E785 Hyperlipidemia, unspecified: Secondary | ICD-10-CM | POA: Diagnosis not present

## 2019-10-21 DIAGNOSIS — K5909 Other constipation: Secondary | ICD-10-CM | POA: Diagnosis not present

## 2019-10-21 DIAGNOSIS — K219 Gastro-esophageal reflux disease without esophagitis: Secondary | ICD-10-CM | POA: Diagnosis not present

## 2019-10-21 DIAGNOSIS — R498 Other voice and resonance disorders: Secondary | ICD-10-CM | POA: Diagnosis not present

## 2019-10-21 DIAGNOSIS — I10 Essential (primary) hypertension: Secondary | ICD-10-CM | POA: Diagnosis not present

## 2019-10-21 DIAGNOSIS — R262 Difficulty in walking, not elsewhere classified: Secondary | ICD-10-CM | POA: Diagnosis not present

## 2019-10-21 DIAGNOSIS — E119 Type 2 diabetes mellitus without complications: Secondary | ICD-10-CM | POA: Diagnosis not present

## 2019-10-21 DIAGNOSIS — Z4781 Encounter for orthopedic aftercare following surgical amputation: Secondary | ICD-10-CM | POA: Diagnosis not present

## 2019-10-21 DIAGNOSIS — R1312 Dysphagia, oropharyngeal phase: Secondary | ICD-10-CM | POA: Diagnosis not present

## 2019-10-22 DIAGNOSIS — Z4781 Encounter for orthopedic aftercare following surgical amputation: Secondary | ICD-10-CM | POA: Diagnosis not present

## 2019-10-22 DIAGNOSIS — R1312 Dysphagia, oropharyngeal phase: Secondary | ICD-10-CM | POA: Diagnosis not present

## 2019-10-22 DIAGNOSIS — E119 Type 2 diabetes mellitus without complications: Secondary | ICD-10-CM | POA: Diagnosis not present

## 2019-10-22 DIAGNOSIS — E785 Hyperlipidemia, unspecified: Secondary | ICD-10-CM | POA: Diagnosis not present

## 2019-10-22 DIAGNOSIS — K5909 Other constipation: Secondary | ICD-10-CM | POA: Diagnosis not present

## 2019-10-22 DIAGNOSIS — R262 Difficulty in walking, not elsewhere classified: Secondary | ICD-10-CM | POA: Diagnosis not present

## 2019-10-22 DIAGNOSIS — R498 Other voice and resonance disorders: Secondary | ICD-10-CM | POA: Diagnosis not present

## 2019-10-22 DIAGNOSIS — I10 Essential (primary) hypertension: Secondary | ICD-10-CM | POA: Diagnosis not present

## 2019-10-22 DIAGNOSIS — K219 Gastro-esophageal reflux disease without esophagitis: Secondary | ICD-10-CM | POA: Diagnosis not present

## 2019-10-23 DIAGNOSIS — K219 Gastro-esophageal reflux disease without esophagitis: Secondary | ICD-10-CM | POA: Diagnosis not present

## 2019-10-23 DIAGNOSIS — Z4781 Encounter for orthopedic aftercare following surgical amputation: Secondary | ICD-10-CM | POA: Diagnosis not present

## 2019-10-23 DIAGNOSIS — I10 Essential (primary) hypertension: Secondary | ICD-10-CM | POA: Diagnosis not present

## 2019-10-23 DIAGNOSIS — R498 Other voice and resonance disorders: Secondary | ICD-10-CM | POA: Diagnosis not present

## 2019-10-23 DIAGNOSIS — K5909 Other constipation: Secondary | ICD-10-CM | POA: Diagnosis not present

## 2019-10-23 DIAGNOSIS — E785 Hyperlipidemia, unspecified: Secondary | ICD-10-CM | POA: Diagnosis not present

## 2019-10-23 DIAGNOSIS — R1312 Dysphagia, oropharyngeal phase: Secondary | ICD-10-CM | POA: Diagnosis not present

## 2019-10-23 DIAGNOSIS — R262 Difficulty in walking, not elsewhere classified: Secondary | ICD-10-CM | POA: Diagnosis not present

## 2019-10-23 DIAGNOSIS — E119 Type 2 diabetes mellitus without complications: Secondary | ICD-10-CM | POA: Diagnosis not present

## 2019-10-24 DIAGNOSIS — D649 Anemia, unspecified: Secondary | ICD-10-CM | POA: Diagnosis not present

## 2019-10-24 DIAGNOSIS — R6889 Other general symptoms and signs: Secondary | ICD-10-CM | POA: Diagnosis not present

## 2019-10-26 DIAGNOSIS — R1312 Dysphagia, oropharyngeal phase: Secondary | ICD-10-CM | POA: Diagnosis not present

## 2019-10-26 DIAGNOSIS — Z4781 Encounter for orthopedic aftercare following surgical amputation: Secondary | ICD-10-CM | POA: Diagnosis not present

## 2019-10-26 DIAGNOSIS — R262 Difficulty in walking, not elsewhere classified: Secondary | ICD-10-CM | POA: Diagnosis not present

## 2019-10-26 DIAGNOSIS — E785 Hyperlipidemia, unspecified: Secondary | ICD-10-CM | POA: Diagnosis not present

## 2019-10-26 DIAGNOSIS — I10 Essential (primary) hypertension: Secondary | ICD-10-CM | POA: Diagnosis not present

## 2019-10-26 DIAGNOSIS — R498 Other voice and resonance disorders: Secondary | ICD-10-CM | POA: Diagnosis not present

## 2019-10-26 DIAGNOSIS — K5909 Other constipation: Secondary | ICD-10-CM | POA: Diagnosis not present

## 2019-10-26 DIAGNOSIS — K219 Gastro-esophageal reflux disease without esophagitis: Secondary | ICD-10-CM | POA: Diagnosis not present

## 2019-10-26 DIAGNOSIS — E119 Type 2 diabetes mellitus without complications: Secondary | ICD-10-CM | POA: Diagnosis not present

## 2019-10-27 DIAGNOSIS — I10 Essential (primary) hypertension: Secondary | ICD-10-CM | POA: Diagnosis not present

## 2019-10-27 DIAGNOSIS — R1312 Dysphagia, oropharyngeal phase: Secondary | ICD-10-CM | POA: Diagnosis not present

## 2019-10-27 DIAGNOSIS — E785 Hyperlipidemia, unspecified: Secondary | ICD-10-CM | POA: Diagnosis not present

## 2019-10-27 DIAGNOSIS — R262 Difficulty in walking, not elsewhere classified: Secondary | ICD-10-CM | POA: Diagnosis not present

## 2019-10-27 DIAGNOSIS — R498 Other voice and resonance disorders: Secondary | ICD-10-CM | POA: Diagnosis not present

## 2019-10-27 DIAGNOSIS — Z4781 Encounter for orthopedic aftercare following surgical amputation: Secondary | ICD-10-CM | POA: Diagnosis not present

## 2019-10-27 DIAGNOSIS — K219 Gastro-esophageal reflux disease without esophagitis: Secondary | ICD-10-CM | POA: Diagnosis not present

## 2019-10-27 DIAGNOSIS — K5909 Other constipation: Secondary | ICD-10-CM | POA: Diagnosis not present

## 2019-10-27 DIAGNOSIS — E119 Type 2 diabetes mellitus without complications: Secondary | ICD-10-CM | POA: Diagnosis not present

## 2019-10-27 DIAGNOSIS — Z1159 Encounter for screening for other viral diseases: Secondary | ICD-10-CM | POA: Diagnosis not present

## 2019-10-28 ENCOUNTER — Non-Acute Institutional Stay: Payer: Medicare Other | Admitting: Primary Care

## 2019-10-28 ENCOUNTER — Other Ambulatory Visit: Payer: Self-pay

## 2019-10-28 DIAGNOSIS — K219 Gastro-esophageal reflux disease without esophagitis: Secondary | ICD-10-CM | POA: Diagnosis not present

## 2019-10-28 DIAGNOSIS — E785 Hyperlipidemia, unspecified: Secondary | ICD-10-CM | POA: Diagnosis not present

## 2019-10-28 DIAGNOSIS — Z4781 Encounter for orthopedic aftercare following surgical amputation: Secondary | ICD-10-CM | POA: Diagnosis not present

## 2019-10-28 DIAGNOSIS — Z515 Encounter for palliative care: Secondary | ICD-10-CM

## 2019-10-28 DIAGNOSIS — K5909 Other constipation: Secondary | ICD-10-CM | POA: Diagnosis not present

## 2019-10-28 DIAGNOSIS — R1312 Dysphagia, oropharyngeal phase: Secondary | ICD-10-CM | POA: Diagnosis not present

## 2019-10-28 DIAGNOSIS — R498 Other voice and resonance disorders: Secondary | ICD-10-CM | POA: Diagnosis not present

## 2019-10-28 DIAGNOSIS — R262 Difficulty in walking, not elsewhere classified: Secondary | ICD-10-CM | POA: Diagnosis not present

## 2019-10-28 DIAGNOSIS — E119 Type 2 diabetes mellitus without complications: Secondary | ICD-10-CM | POA: Diagnosis not present

## 2019-10-28 DIAGNOSIS — I10 Essential (primary) hypertension: Secondary | ICD-10-CM | POA: Diagnosis not present

## 2019-10-28 NOTE — Progress Notes (Signed)
[facility] Palliative Care Consult Note Telephone: [phone]  Fax: [phone]  TELEHEALTH VISIT STATEMENT Due to the COVID-19 crisis, this visit was done via telemedicine from my office. It was initiated and consented to by this patient and/or family.  PATIENT NAME: Megan Obrien [phone] (home)  DOB: 11-13-1945 MRN: [id number]  PRIMARY CARE PROVIDER:   [name], MD, [address] [place] [address] [phone]  REFERRING PROVIDER:  [name], MD [address] [place],  [place] [address] [phone]  RESPONSIBLE PARTY:   Extended Emergency Contact Information Primary Emergency Contact: [place] of [place] Mobile Phone: [phone] Relation: Daughter Secondary Emergency Contact: Jennette Kettle, [place] of [place] Phone: [phone] Relation: Son   ASSESSMENT AND RECOMMENDATIONS:   1. Advance Care Planning/Goals of Care: Goals include to maximize quality of life and symptom management. Patientt has MOST with DNR, Full scope of care, abx, iv and feeding tube use. Son called to check on his mother's status.  I returned call and filled him in on her current status. I also called daughter Santiago Glad, message left.  2. Symptom Management:   Mobility: States she is "not getting much PT" but PT is seeing her. Doing ROM. She is still non weight bearing to RUE and RLE. Sling in place.  Anemia: H and H = 10/24/19 Hgb= 10.7   Hct= 31.5  Which is improved, but albumin= 2.6 which is down from 2.8 two weeks ago.  Nutrition:Getting house shakes, Prostat. Endorses early satiety. Added milk with lunch and dinner trays, which she sometimes drinks. Recommend increase in protein. Eating 50-75% of food and still losing weight, implying and absorption issue.  Pain: No pain now, oxycodone 2.5 mg qid controlling for no need for break through Denies pain now,  appears not painful. States mild pain but this is much better.  Mental Status: Not to baseline. Confused at times. States pain is better.  3. Family /Caregiver/Community Supports: Has daughter and son who are involved. Lives in [place].  4. Cognitive / Functional decline:  Appears more interactive and reliable with memory. Getting PT now although staff states non weight bearing.  5. Follow up Palliative Care Visit: Palliative care will continue to follow for goals of care clarification and symptom management. Return 4 weeks or prn.  I spent 35 minutes providing this consultation,  from 1400 to 1435. More than 50% of the time in this consultation was spent coordinating communication.   HISTORY OF PRESENT ILLNESS:  Megan Obrien is a 74 y.o. year old female with multiple medical problems including protein calorie malnutrition, recent covid 19 infection, recent fx of right femur and humerus, CAD, CVD, PAH, increasing cognitive impairment. Palliative Care was asked to follow this patient by consultation request of [name], MD to help address advance care planning and goals of care. This is a follow up visit.  CODE STATUS: DNR, otherwise FULL scope  PPS: 30% HOSPICE ELIGIBILITY/DIAGNOSIS: TBD  PAST MEDICAL HISTORY:  Past Medical History:  Diagnosis Date  . (HFpEF) heart failure with preserved ejection fraction ([place])    a. 03/2017 Echo: EF 55-60%, no rwma, Gr1 DD, mild to mod MS, mod to sev dil LA; b. 08/2019 Echo: EF 60-65%, PA 48/27(34). Nl RV fxn. Mod dil LA. Mild MR, mod MS. Triv TR. Mild to mod Ao Sclerosis w/o stenosis. Mild PR.  [name] Arthritis   .  Carotid arterial disease ([place])    a. 11/2017 CTA Head/Neck: RICA 45, LICA 60.  [name] Cerebrovascular disease    a. 11/2017 MRA: mod to sev stenosis of bilat PCA's P2 segments; b. 11/2017 CTA Head/Neck: RICA 45, LICA 60, sev bilat distal MCA branch stenoses w/ sev bilat P2 stenoses.  . CKD (chronic kidney disease), stage III   . COVID-19  08/2019  . Depression   . GERD (gastroesophageal reflux disease)   . GIB (gastrointestinal bleeding)    a. 08/2019 in setting of INR>10.  . Hyperlipidemia   . Hypertension   . Left Atrial Appendage Thrombus    a. 11/2017 TEE (performed in setting of stroke/aphasia): nl EF, mod MS, dil LA w/ heavy smoke and small LAA thrombus-->Warfarin initiated.  . Mitral stenosis    a. Mild to moderate by echo 03/2017; b. 03/2017 R/LHC: PCWP 35mmHg, LVEDP 68mmHg. Mean grad of 15mmHg. Valve area of 2.02cm^2; c. 11/2017 TEE: Mod MS.  . Morbid obesity ([place])   . Non-obstructive CAD (coronary artery disease)    a. 03/2017 Cath: mild, non-obstructive CAD.  [name] OSA (obstructive sleep apnea)    a. Previously wore CPAP for several years but stopped on her own and now just uses O2 via [place] @ night.  [name] PAH (pulmonary artery hypertension) ([place])    a. 03/2017 RHC: PA 48mmHg.  [name] Persistent atrial fibrillation ([place])   . Seasonal allergies   . Stroke [facility])    a. 11/2017 - presented w/ aphasia->MRI acute to early subacute ischemia w/in multiple vascular territories, largest in R frontal & L parietal lobes. Smaller foci of ischemia w/in R hippocampus, R parietal lobe, and L peripheral postcentral gyrus.  . Type 2 diabetes mellitus ([place])   . Urinary incontinence     SOCIAL HX:  Social History   Tobacco Use  . Smoking status: Never Smoker  . Smokeless tobacco: Never Used  Substance Use Topics  . Alcohol use: No    Alcohol/week: 0.0 standard drinks    Comment: rarely    ALLERGIES: No Known Allergies   PERTINENT MEDICATIONS:  Outpatient Encounter Medications as of 10/28/2019  Medication Sig  . acetaminophen (TYLENOL) 650 MG CR tablet Take 650 mg by mouth every 8 (eight) hours as needed for pain.  [name] alum & mag hydroxide-simeth (MAALOX/MYLANTA) 200-200-20 MG/5ML suspension Take 10 mLs by mouth every 4 (four) hours as needed for indigestion or heartburn.   [name] ammonium lactate (LAC-HYDRIN) 12 % lotion Apply 1 application  topically 2 (two) times daily. Apply to feet  . atorvastatin (LIPITOR) 40 MG tablet Take 1 tablet (40 mg total) by mouth at bedtime.  . carvedilol (COREG) 12.5 MG tablet Take 1 tablet (12.5 mg total) by mouth 2 (two) times daily with a meal.  . diclofenac sodium (VOLTAREN) 1 % GEL Apply 2 g topically 2 (two) times a day. To both knees  . docusate sodium (COLACE) 100 MG capsule Take 1 capsule (100 mg total) by mouth 2 (two) times daily.  [name] FLUoxetine (PROZAC) 40 MG capsule Take 1 capsule by mouth once daily for depression and anxiety.  . furosemide (LASIX) 20 MG tablet Take 3 tablets (60 mg total) by mouth daily.  . hydrALAZINE (APRESOLINE) 50 MG tablet Take 2 tablets (100 mg total) by mouth 3 (three) times daily.  . Hydrocortisone (GERHARDT'S BUTT CREAM) CREA Apply in a thin layer after cleansing to perineal area, posterior and medial thighs and buttocks.  Turn from side to side with a pillow between  knees to promote air-flow  . Insulin Glargine (LANTUS) 100 UNIT/ML Solostar Pen Inject 15 Units into the skin every evening. (Patient taking differently: Inject 15 Units into the skin daily. )  . loperamide (IMODIUM) 2 MG capsule Take 2 mg by mouth 4 (four) times daily as needed for diarrhea or loose stools.   . magnesium oxide (MAG-OX) 400 MG tablet Take 400 mg by mouth daily.  . Melatonin 5 MG TABS Take 5 mg by mouth at bedtime.  . Multiple Vitamin (MULTIVITAMIN) tablet Take 1 tablet by mouth daily.  [name] omega-3 acid ethyl esters (LOVAZA) 1 g capsule TAKE 1 CAPSULE BY MOUTH ONCE DAILY WITH FOOD FOR CHOLESTEROL (Patient taking differently: Take 1 g by mouth. TAKE 1 CAPSULE BY MOUTH ONCE DAILY WITH FOOD FOR CHOLESTEROL)  . omeprazole (PRILOSEC) 40 MG capsule Take 1 capsule (40 mg total) by mouth daily.  . ondansetron (ZOFRAN) 4 MG tablet Take 4 mg by mouth every 8 (eight) hours as needed for nausea or vomiting.  [name] rOPINIRole (REQUIP) 0.25 MG tablet Take 0.25 mg by mouth at bedtime.  . saccharomyces  boulardii (FLORASTOR) 250 MG capsule Take 250 mg by mouth 2 (two) times daily.  . sucralfate (CARAFATE) 1 g tablet Take 1 g by mouth 4 (four) times daily -  with meals and at bedtime.  . vitamin B-12 (CYANOCOBALAMIN) 250 MCG tablet Take 250 mcg by mouth daily.   No facility-administered encounter medications on file as of 10/28/2019.    PHYSICAL EXAM / ROS:   Current and past weights: 234 lbs. General: NAD, frail appearing, obese Cardiovascular: no chest pain reported, no edema reported Pulmonary: no cough, no increased SOB, no DOE Abdomen: appetite fair, 50-75% most of the time, denies constipation, continent of bowel per self report GU: denies dysuria, continent of urine per self report MSK:  no joint deformities, non ambulatory, PT for ROM. Skin: no rashes or wounds reported Neurological: Weakness, confusion improved, pain reduced. Clearer mentally but not at baseline.  Jason Coop, NP, [facility]

## 2019-10-29 ENCOUNTER — Telehealth: Payer: Self-pay | Admitting: Primary Care

## 2019-10-29 DIAGNOSIS — K922 Gastrointestinal hemorrhage, unspecified: Secondary | ICD-10-CM | POA: Diagnosis not present

## 2019-10-29 DIAGNOSIS — E119 Type 2 diabetes mellitus without complications: Secondary | ICD-10-CM | POA: Diagnosis not present

## 2019-10-29 DIAGNOSIS — I10 Essential (primary) hypertension: Secondary | ICD-10-CM | POA: Diagnosis not present

## 2019-10-29 DIAGNOSIS — Z8616 Personal history of COVID-19: Secondary | ICD-10-CM | POA: Diagnosis not present

## 2019-10-29 DIAGNOSIS — M6281 Muscle weakness (generalized): Secondary | ICD-10-CM | POA: Diagnosis not present

## 2019-10-29 NOTE — Telephone Encounter (Signed)
T/c to daughter RE patient's status. We discussed her weight loss, 80 pounds in 6 months. Daughter is concerned about her poor intake, she will call dietary Re: preferences and finger foods, and she may benefit from feeding from staff entire meal.   We discussed her mentation and discussed delirium and how it's fluid and variable. We discussed her recent blisters and lab work. We discussed her 3 broken bones and immobility.   Daughter states her mother has states she wants to go naturally, not be hooked up to machines and lines.  She says she'd never want to be on a feeding tube. We discussed hospice services as well, staying in place, having services in place and family visitation. We discussed MOST form and she states she will re do it.  She will change to DNR, limited health care, abx use, iv use, and no feeding tube.

## 2019-10-30 DIAGNOSIS — K5909 Other constipation: Secondary | ICD-10-CM | POA: Diagnosis not present

## 2019-10-30 DIAGNOSIS — Z4781 Encounter for orthopedic aftercare following surgical amputation: Secondary | ICD-10-CM | POA: Diagnosis not present

## 2019-10-30 DIAGNOSIS — K219 Gastro-esophageal reflux disease without esophagitis: Secondary | ICD-10-CM | POA: Diagnosis not present

## 2019-10-30 DIAGNOSIS — R498 Other voice and resonance disorders: Secondary | ICD-10-CM | POA: Diagnosis not present

## 2019-10-30 DIAGNOSIS — E119 Type 2 diabetes mellitus without complications: Secondary | ICD-10-CM | POA: Diagnosis not present

## 2019-10-30 DIAGNOSIS — R1312 Dysphagia, oropharyngeal phase: Secondary | ICD-10-CM | POA: Diagnosis not present

## 2019-10-30 DIAGNOSIS — E785 Hyperlipidemia, unspecified: Secondary | ICD-10-CM | POA: Diagnosis not present

## 2019-10-30 DIAGNOSIS — R262 Difficulty in walking, not elsewhere classified: Secondary | ICD-10-CM | POA: Diagnosis not present

## 2019-10-30 DIAGNOSIS — I10 Essential (primary) hypertension: Secondary | ICD-10-CM | POA: Diagnosis not present

## 2019-11-02 DIAGNOSIS — K5909 Other constipation: Secondary | ICD-10-CM | POA: Diagnosis not present

## 2019-11-02 DIAGNOSIS — R6889 Other general symptoms and signs: Secondary | ICD-10-CM | POA: Diagnosis not present

## 2019-11-02 DIAGNOSIS — Z4781 Encounter for orthopedic aftercare following surgical amputation: Secondary | ICD-10-CM | POA: Diagnosis not present

## 2019-11-02 DIAGNOSIS — E119 Type 2 diabetes mellitus without complications: Secondary | ICD-10-CM | POA: Diagnosis not present

## 2019-11-02 DIAGNOSIS — R262 Difficulty in walking, not elsewhere classified: Secondary | ICD-10-CM | POA: Diagnosis not present

## 2019-11-02 DIAGNOSIS — D649 Anemia, unspecified: Secondary | ICD-10-CM | POA: Diagnosis not present

## 2019-11-02 DIAGNOSIS — E785 Hyperlipidemia, unspecified: Secondary | ICD-10-CM | POA: Diagnosis not present

## 2019-11-02 DIAGNOSIS — I482 Chronic atrial fibrillation, unspecified: Secondary | ICD-10-CM | POA: Diagnosis not present

## 2019-11-02 DIAGNOSIS — I10 Essential (primary) hypertension: Secondary | ICD-10-CM | POA: Diagnosis not present

## 2019-11-02 DIAGNOSIS — K219 Gastro-esophageal reflux disease without esophagitis: Secondary | ICD-10-CM | POA: Diagnosis not present

## 2019-11-03 DIAGNOSIS — K5909 Other constipation: Secondary | ICD-10-CM | POA: Diagnosis not present

## 2019-11-03 DIAGNOSIS — I482 Chronic atrial fibrillation, unspecified: Secondary | ICD-10-CM | POA: Diagnosis not present

## 2019-11-03 DIAGNOSIS — N390 Urinary tract infection, site not specified: Secondary | ICD-10-CM | POA: Diagnosis not present

## 2019-11-03 DIAGNOSIS — K219 Gastro-esophageal reflux disease without esophagitis: Secondary | ICD-10-CM | POA: Diagnosis not present

## 2019-11-03 DIAGNOSIS — I10 Essential (primary) hypertension: Secondary | ICD-10-CM | POA: Diagnosis not present

## 2019-11-03 DIAGNOSIS — Z4781 Encounter for orthopedic aftercare following surgical amputation: Secondary | ICD-10-CM | POA: Diagnosis not present

## 2019-11-03 DIAGNOSIS — R262 Difficulty in walking, not elsewhere classified: Secondary | ICD-10-CM | POA: Diagnosis not present

## 2019-11-03 DIAGNOSIS — Z1159 Encounter for screening for other viral diseases: Secondary | ICD-10-CM | POA: Diagnosis not present

## 2019-11-03 DIAGNOSIS — E785 Hyperlipidemia, unspecified: Secondary | ICD-10-CM | POA: Diagnosis not present

## 2019-11-03 DIAGNOSIS — R319 Hematuria, unspecified: Secondary | ICD-10-CM | POA: Diagnosis not present

## 2019-11-03 DIAGNOSIS — E119 Type 2 diabetes mellitus without complications: Secondary | ICD-10-CM | POA: Diagnosis not present

## 2019-11-03 DIAGNOSIS — Z79899 Other long term (current) drug therapy: Secondary | ICD-10-CM | POA: Diagnosis not present

## 2019-11-05 ENCOUNTER — Ambulatory Visit (INDEPENDENT_AMBULATORY_CARE_PROVIDER_SITE_OTHER): Payer: Medicare Other | Admitting: Cardiovascular Disease

## 2019-11-05 ENCOUNTER — Encounter: Payer: Self-pay | Admitting: Cardiovascular Disease

## 2019-11-05 ENCOUNTER — Other Ambulatory Visit: Payer: Self-pay

## 2019-11-05 VITALS — BP 132/63 | HR 62

## 2019-11-05 DIAGNOSIS — I10 Essential (primary) hypertension: Secondary | ICD-10-CM | POA: Diagnosis not present

## 2019-11-05 DIAGNOSIS — I480 Paroxysmal atrial fibrillation: Secondary | ICD-10-CM | POA: Diagnosis not present

## 2019-11-05 DIAGNOSIS — I050 Rheumatic mitral stenosis: Secondary | ICD-10-CM | POA: Diagnosis not present

## 2019-11-05 DIAGNOSIS — I5032 Chronic diastolic (congestive) heart failure: Secondary | ICD-10-CM | POA: Diagnosis not present

## 2019-11-05 NOTE — Progress Notes (Signed)
Cardiology Office Note   Date:  11/05/2019   ID:  [name], DOB 05-Jan-1946, MRN [id number]  PCP:  Megan Pitch, MD  Cardiologist:   [name], MD   Chief Complaint  Patient presents with  . office visit    2 month F/U-Patient reports nausea; Meds reviewed with med rec sheet sent by nursing care facility.      History of Present Illness: Megan Obrien is a 74 y.o. female who Is here today for a follow-up visit regarding chronic diastolic heart failure, pulmonary hypertension, left atrial appendage thrombus with presumed paroxysmal atrial fibrillation and mitral stenosis. She has known history of type 2 diabetes, morbid obesity, hyperlipidemia, stroke, chronic kidney disease, sleep apnea and essential hypertension.  Left cardiac catheterization in July 2018 showed mild nonobstructive coronary artery disease. Right heart catheterization showed moderately elevated filling pressures, severe pulmonary hypertension and normal cardiac output. Mean PA pressure was 59 mmHg and pulmonary capillary wedge pressure was 31 mmHg with an LVEDP of 19 mmHg. Mitral stenosis was mild-moderate with a mean gradient of 8 mmHg and valve area of 2.02 cm.   She is currently at [facility] rehab and might have to stay there long-term given her poor functional capacity.    She declined significantly over the last 6 months.  She was hospitalized in November with upper GI bleed in the setting of INR greater than 10.  Hemoglobin was 3.9.  Her warfarin was reversed and she required 4 units of packed RBCs.  She tested positive for COVID-19 and was transferred to [facility] where she was treated with steroids and remdesivir.  She had acute on chronic renal failure that subsequently improved.  Telemetry showed intermittent atrial fibrillation.  She was hospitalized in December after a fall and hip fracture.  She had hip surgery done.  Overall, she has been gradually declining with gradual  weight loss and poor appetite.  She was seen by GI regarding her blood loss anemia and was felt to be not a good candidate for endoscopic procedures.  As a matter fact she has been followed by palliative care team with recommendations to consider hospice care.  The patient is minimally interactive today.    Past Medical History:  Diagnosis Date  . (HFpEF) heart failure with preserved ejection fraction ([place])    a. 03/2017 Echo: EF 55-60%, no rwma, Gr1 DD, mild to mod MS, mod to sev dil LA; b. 08/2019 Echo: EF 60-65%, PA 48/27(34). Nl RV fxn. Mod dil LA. Mild MR, mod MS. Triv TR. Mild to mod Ao Sclerosis w/o stenosis. Mild PR.  [name] Arthritis   . Carotid arterial disease ([place])    a. 11/2017 CTA Head/Neck: RICA 45, LICA 60.  [name] Cerebrovascular disease    a. 11/2017 MRA: mod to sev stenosis of bilat PCA's P2 segments; b. 11/2017 CTA Head/Neck: RICA 45, LICA 60, sev bilat distal MCA branch stenoses w/ sev bilat P2 stenoses.  . CKD (chronic kidney disease), stage III   . COVID-19 08/2019  . Depression   . GERD (gastroesophageal reflux disease)   . GIB (gastrointestinal bleeding)    a. 08/2019 in setting of INR>10.  . Hyperlipidemia   . Hypertension   . Left Atrial Appendage Thrombus    a. 11/2017 TEE (performed in setting of stroke/aphasia): nl EF, mod MS, dil LA w/ heavy smoke and small LAA thrombus-->Warfarin initiated.  . Mitral stenosis    a. Mild to moderate by echo 03/2017; b. 03/2017  R/LHC: PCWP 102mmHg, LVEDP 41mmHg. Mean grad of 29mmHg. Valve area of 2.02cm^2; c. 11/2017 TEE: Mod MS.  . Morbid obesity ([place])   . Non-obstructive CAD (coronary artery disease)    a. 03/2017 Cath: mild, non-obstructive CAD.  [name] OSA (obstructive sleep apnea)    a. Previously wore CPAP for several years but stopped on her own and now just uses O2 via [place] @ night.  [name] PAH (pulmonary artery hypertension) ([place])    a. 03/2017 RHC: PA 42mmHg.  [name] Persistent atrial fibrillation ([place])   . Seasonal allergies   . Stroke [facility])     a. 11/2017 - presented w/ aphasia->MRI acute to early subacute ischemia w/in multiple vascular territories, largest in R frontal & L parietal lobes. Smaller foci of ischemia w/in R hippocampus, R parietal lobe, and L peripheral postcentral gyrus.  . Type 2 diabetes mellitus ([place])   . Urinary incontinence     Past Surgical History:  Procedure Laterality Date  . BACK SURGERY    . CARDIAC CATHETERIZATION    . KNEE SURGERY Left   . RIGHT/LEFT HEART CATH AND CORONARY ANGIOGRAPHY Bilateral 04/25/2017   Procedure: Right/Left Heart Cath and Coronary Angiography;  Surgeon: Wellington Hampshire, MD;  Location: [place] [facility];  Service: Cardiovascular;  Laterality: Bilateral;  . TEE WITHOUT CARDIOVERSION N/A 12/20/2017   Procedure: TRANSESOPHAGEAL ECHOCARDIOGRAM (TEE);  Surgeon: Wellington Hampshire, MD;  Location: [facility] ORS;  Service: Cardiovascular;  Laterality: N/A;  . TONSILLECTOMY AND ADENOIDECTOMY  1952     Current Outpatient Medications  Medication Sig Dispense Refill  . acetaminophen (TYLENOL) 650 MG CR tablet Take 650 mg by mouth every 8 (eight) hours as needed for pain.    . Amino Acids-Protein Hydrolys (FEEDING SUPPLEMENT, PRO-STAT SUGAR FREE 64,) LIQD Take 30 mLs by mouth daily.    . diclofenac sodium (VOLTAREN) 1 % GEL Apply 2 g topically 2 (two) times a day. To both knees    . docusate sodium (COLACE) 100 MG capsule Take 1 capsule (100 mg total) by mouth 2 (two) times daily. 10 capsule 0  . FLUoxetine (PROZAC) 40 MG capsule Take 1 capsule by mouth once daily for depression and anxiety. 90 capsule 1  . furosemide (LASIX) 20 MG tablet Take 3 tablets (60 mg total) by mouth daily. 90 tablet 3  . hydrALAZINE (APRESOLINE) 50 MG tablet Take 2 tablets (100 mg total) by mouth 3 (three) times daily.    . Hydrocortisone (GERHARDT'S BUTT CREAM) CREA Apply in a thin layer after cleansing to perineal area, posterior and medial thighs and buttocks.  Turn from side to side with a pillow between knees to  promote air-flow    . loperamide (IMODIUM) 2 MG capsule Take 2 mg by mouth 4 (four) times daily as needed for diarrhea or loose stools.     . magnesium oxide (MAG-OX) 400 MG tablet Take 400 mg by mouth daily.    . Melatonin 5 MG TABS Take 5 mg by mouth at bedtime.    . Multiple Vitamin (MULTIVITAMIN) tablet Take 1 tablet by mouth daily.    [name] omega-3 acid ethyl esters (LOVAZA) 1 g capsule TAKE 1 CAPSULE BY MOUTH ONCE DAILY WITH FOOD FOR CHOLESTEROL (Patient taking differently: Take 1 g by mouth. TAKE 1 CAPSULE BY MOUTH ONCE DAILY WITH FOOD FOR CHOLESTEROL) 90 capsule 1  . omeprazole (PRILOSEC) 40 MG capsule Take 1 capsule (40 mg total) by mouth daily. 90 capsule 1  . ondansetron (ZOFRAN) 4 MG tablet Take 4  mg by mouth every 8 (eight) hours as needed for nausea or vomiting.    . OXYCODONE ER PO Take 5 mg by mouth 4 (four) times daily.    . polyethylene glycol (MIRALAX / GLYCOLAX) 17 g packet Take 17 g by mouth 2 (two) times daily.    [name] rOPINIRole (REQUIP) 0.25 MG tablet Take 0.25 mg by mouth at bedtime.    . saccharomyces boulardii (FLORASTOR) 250 MG capsule Take 250 mg by mouth 2 (two) times daily.    . sucralfate (CARAFATE) 1 g tablet Take 1 g by mouth 4 (four) times daily -  with meals and at bedtime.    . thiamine (VITAMIN B-1) 50 MG tablet Take 100 mg by mouth daily.    . vitamin B-12 (CYANOCOBALAMIN) 250 MCG tablet Take 250 mcg by mouth daily.    [name] VITAMIN D, CHOLECALCIFEROL, PO Take 400 Int'l Units by mouth daily.    [name] alum & mag hydroxide-simeth (MAALOX/MYLANTA) 200-200-20 MG/5ML suspension Take 10 mLs by mouth every 4 (four) hours as needed for indigestion or heartburn.     [name] ammonium lactate (LAC-HYDRIN) 12 % lotion Apply 1 application topically 2 (two) times daily. Apply to feet    . atorvastatin (LIPITOR) 40 MG tablet Take 1 tablet (40 mg total) by mouth at bedtime. 90 tablet 1  . carvedilol (COREG) 12.5 MG tablet Take 1 tablet (12.5 mg total) by mouth 2 (two) times daily with a meal.       No current facility-administered medications for this visit.    Allergies:   Patient has no known allergies.    Social History:  The patient  reports that she has never smoked. She has never used smokeless tobacco. She reports that she does not drink alcohol or use drugs.   Family History:  The patient's family history includes Alzheimer's disease in her mother; Lung cancer in her father; Stroke in her maternal grandfather.    ROS:  Please see the history of present illness.   Otherwise, review of systems are positive for none.   All other systems are reviewed and negative.    PHYSICAL EXAM: VS:  BP 132/63 (BP Location: Left Arm, Patient Position: Sitting, Cuff Size: Large)   Pulse 62   SpO2 95%  , BMI There is no height or weight on file to calculate BMI. GEN: Well nourished, well developed, in no acute distress  HEENT: normal  Neck: no JVD, carotid bruits, or masses Cardiac: RRR; no rubs, or gallops. 2/6 systolic ejection murmur in the aortic area which is early peaking. There is mild bilateral leg edema bilaterally with tonic stasis dermatitis Respiratory:  clear to auscultation bilaterally, normal work of breathing GI: soft, nontender, nondistended, + BS MS: no deformity or atrophy  Skin: warm and dry, no rash Neuro:  Strength and sensation are intact Psych: euthymic mood, full affect   EKG:  EKG is ordered today. The ekg ordered today demonstrates sinus rhythm with PACs.  Right bundle branch block.   Recent Labs: 08/09/2019: TSH 3.914 08/10/2019: B Natriuretic Peptide 668.9 08/15/2019: ALT 15 08/16/2019: Magnesium 1.6 09/24/2019: BUN 40; Creatinine, Ser 2.03; Hemoglobin 12.2; Platelets 260; Potassium 3.3; Sodium 140    Lipid Panel    Component Value Date/Time   CHOL 139 02/06/2018 1145   TRIG 148.0 02/06/2018 1145   HDL 41.10 02/06/2018 1145   CHOLHDL 3 02/06/2018 1145   VLDL 29.6 02/06/2018 1145   LDLCALC 68 02/06/2018 1145      Wt Readings  from Last 3  Encounters:  09/24/19 270 lb 4.5 oz (122.6 kg)  08/17/19 (!) 376 lb 15.8 oz (171 kg)  08/08/19 (!) 304 lb 3.8 oz (138 kg)       PAD Screen 04/19/2017  Previous PAD dx? Yes  Previous surgical procedure? No  Pain with walking? Yes  Subsides with rest? Yes  Feet/toe relief with dangling? No  Painful, non-healing ulcers? No  Extremities discolored? Yes      ASSESSMENT AND PLAN:   1. Nonobstructive coronary artery disease: Currently with no chest pain or anginal symptoms.  Continue medical therapy 2. Paroxysmal atrial fibrillation with left atrial appendage thrombus: This was detected as part of stroke work-up in 2019.  The patient had a life-threatening bleed on warfarin with a hemoglobin of 3.9.  She was seen by GI who felt that endoscopic procedures were high risk considering her comorbidities.  I think it will be difficult to resume anticoagulation without knowing her bleeding risk.  Given gradual decline of her health issues, I am not going to resume anticoagulation at the present time. 3. Moderate mitral stenosis: This was moderate on most recent evaluation.  Given poor functional capacity, she is not a candidate for any valve surgery in the future 4. Chronic diastolic heart failure: She seems to be euvolemic on current dose of furosemide.   5. Essential hypertension: Blood pressure is controlled on current medications 6. Hyperlipidemia: Continue atorvastatin   Disposition:   Virtual visit in 6 months.  Continue palliative care approach and I agree with consideration of hospice care  Signed,  [name], MD  11/05/2019 11:13 AM    [place]

## 2019-11-05 NOTE — Patient Instructions (Signed)
Medication Instructions:  Your physician recommends that you continue on your current medications as directed. Please refer to the Current Medication list given to you today.  *If you need a refill on your cardiac medications before your next appointment, please call your pharmacy*  Lab Work: None ordered If you have labs (blood work) drawn today and your tests are completely normal, you will receive your results only by: [name] MyChart Message (if you have MyChart) OR . A paper copy in the mail If you have any lab test that is abnormal or we need to change your treatment, we will call you to review the results.  Testing/Procedures: None ordered  Follow-Up: At [facility], you and your health needs are our priority.  As part of our continuing mission to provide you with exceptional heart care, we have created designated Provider Care Teams.  These Care Teams include your primary Cardiologist (physician) and Advanced Practice Providers (APPs -  Physician Assistants and Nurse Practitioners) who all work together to provide you with the care you need, when you need it.  Your next appointment:   6 month(s)  The format for your next appointment:   Virtual Visit   Provider:    You may see [name], MD or one of the following Advanced Practice Providers on your designated Care Team:    Murray Hodgkins, NP  [name], PA-C  Marrianne Mood, PA-C   Other Instructions N/A

## 2019-11-06 NOTE — Addendum Note (Signed)
Addended by: Raelene Bott, Caleigh Rabelo L on: 11/06/2019 03:15 PM   Modules accepted: Orders

## 2019-11-10 DIAGNOSIS — L853 Xerosis cutis: Secondary | ICD-10-CM | POA: Diagnosis not present

## 2019-11-10 DIAGNOSIS — I739 Peripheral vascular disease, unspecified: Secondary | ICD-10-CM | POA: Diagnosis not present

## 2019-11-10 DIAGNOSIS — Z1159 Encounter for screening for other viral diseases: Secondary | ICD-10-CM | POA: Diagnosis not present

## 2019-11-10 DIAGNOSIS — B351 Tinea unguium: Secondary | ICD-10-CM | POA: Diagnosis not present

## 2019-11-11 ENCOUNTER — Other Ambulatory Visit: Payer: Self-pay

## 2019-11-11 ENCOUNTER — Non-Acute Institutional Stay: Payer: Medicare Other | Admitting: Primary Care

## 2019-11-11 DIAGNOSIS — Z515 Encounter for palliative care: Secondary | ICD-10-CM

## 2019-11-11 NOTE — Progress Notes (Signed)
[facility] Palliative Care Consult Note Telephone: [phone]  Fax: [phone]  TELEHEALTH VISIT STATEMENT Due to the COVID-19 crisis, this visit was done via telemedicine from my office. It was initiated and consented to by this patient and/or family.  PATIENT NAME: Megan Obrien. [address] [place] [address] [phone] (home)  DOB: 1946/03/20 MRN: [id number]  PRIMARY CARE PROVIDER:   [name], MD, [address] [place] [address] [phone]  REFERRING PROVIDER:  [name], MD [address] [place],  [place] [address] [phone]  RESPONSIBLE PARTY:   Extended Emergency Contact Information Primary Emergency Contact: [name] Address: [address]          [place], [place] [address] Johnnette Litter of [place] Phone: [phone] Relation: Daughter Secondary Emergency Contact: Jennette Kettle, Harlan [place] of [place] Phone: [phone] Relation: Son   ASSESSMENT AND RECOMMENDATIONS:   1. Advance Care Planning/Goals of Care: Goals include to maximize quality of life and symptom management.  MOST on file with DNR, Full Scope, Abx use, use of IV, use of feeding tube. I called POA [name]. No answer, message and direct number left.  2. Symptom Management:   Nutrition: Recommend staff feeding due to broken dominant arm, recommend mirtazapine. States not drinking milkshakes or  prostat. Eating <25% which is decreased from 2 weeks ago. She has lost  16 lbs in a month, 7% of weight.  Endorses poor appetite. She is also right handed, and has a broken right arm. She may benefit from more assistance feeding as she is having to eat with non dominant hand. Also recommend mirtazipine for appetite stimulation as she endorsed anorexia as well.  Pain: States she's not having to ask;  narcotic has been reduced to bid with  Acetaminophen ATC tid. Appears comfortable on current  regimen.  Mobility: Remains non weight bearing to both extremities. She states she has PT for ROM. Recent radiography report cites post surgical changes. Due for another xray 2/23.  Sleeping: Endorses good sleep at night time. Denies hs pain.  3. Family /Caregiver/Community Supports: Has daughter and son who are involved. Daughter is POA.  Lives in [place].  4. Cognitive / Functional decline: Appears clear today, still somewhat forgetful. Dependent in adls, esp due to dominant arm fx.  5. Follow up Palliative Care Visit: Palliative care will continue to follow for goals of care clarification and symptom management. Return 3-4 weeks or prn.  I spent 25 minutes providing this consultation,  from 1430 to 1455. More than 50% of the time in this consultation was spent coordinating communication.   HISTORY OF PRESENT ILLNESS:  Megan Obrien is a 74 y.o. year old female with multiple medical problems including protein calorie malnutrition, recent covid 19 infection, recent fx of right femur and humerus, CAD, CVD, PAH, increasing cognitive impairment. Palliative Care was asked to follow this patient by consultation request of [name], MD to help address advance care planning and goals of care. This is a follow up visit.  CODE STATUS: DNR but full scope of care.  PPS: 30% HOSPICE ELIGIBILITY/DIAGNOSIS: TBD  PAST MEDICAL HISTORY:  Past Medical History:  Diagnosis Date  . (HFpEF) heart failure with preserved ejection fraction ([place])    a. 03/2017 Echo: EF 55-60%, no rwma, Gr1 DD, mild to mod MS, mod to sev dil LA; b. 08/2019 Echo: EF 60-65%, PA 48/27(34). Nl RV fxn. Mod  dil LA. Mild MR, mod MS. Triv TR. Mild to mod Ao Sclerosis w/o stenosis. Mild PR.  [name] Arthritis   . Carotid arterial disease ([place])    a. 11/2017 CTA Head/Neck: RICA 45, LICA 60.  [name] Cerebrovascular disease    a. 11/2017 MRA: mod to sev stenosis of bilat PCA's P2 segments; b. 11/2017 CTA Head/Neck: RICA 45, LICA 60, sev bilat  distal MCA branch stenoses w/ sev bilat P2 stenoses.  . CKD (chronic kidney disease), stage III   . COVID-19 08/2019  . Depression   . GERD (gastroesophageal reflux disease)   . GIB (gastrointestinal bleeding)    a. 08/2019 in setting of INR>10.  . Hyperlipidemia   . Hypertension   . Left Atrial Appendage Thrombus    a. 11/2017 TEE (performed in setting of stroke/aphasia): nl EF, mod MS, dil LA w/ heavy smoke and small LAA thrombus-->Warfarin initiated.  . Mitral stenosis    a. Mild to moderate by echo 03/2017; b. 03/2017 R/LHC: PCWP 78mmHg, LVEDP 55mmHg. Mean grad of 50mmHg. Valve area of 2.02cm^2; c. 11/2017 TEE: Mod MS.  . Morbid obesity ([place])   . Non-obstructive CAD (coronary artery disease)    a. 03/2017 Cath: mild, non-obstructive CAD.  [name] OSA (obstructive sleep apnea)    a. Previously wore CPAP for several years but stopped on her own and now just uses O2 via [place] @ night.  [name] PAH (pulmonary artery hypertension) ([place])    a. 03/2017 RHC: PA 81mmHg.  [name] Persistent atrial fibrillation ([place])   . Seasonal allergies   . Stroke [facility])    a. 11/2017 - presented w/ aphasia->MRI acute to early subacute ischemia w/in multiple vascular territories, largest in R frontal & L parietal lobes. Smaller foci of ischemia w/in R hippocampus, R parietal lobe, and L peripheral postcentral gyrus.  . Type 2 diabetes mellitus ([place])   . Urinary incontinence     SOCIAL HX:  Social History   Tobacco Use  . Smoking status: Never Smoker  . Smokeless tobacco: Never Used  Substance Use Topics  . Alcohol use: No    Alcohol/week: 0.0 standard drinks    Comment: rarely    ALLERGIES: No Known Allergies   PERTINENT MEDICATIONS:  Outpatient Encounter Medications as of 11/11/2019  Medication Sig  . acetaminophen (TYLENOL) 650 MG CR tablet Take 650 mg by mouth every 8 (eight) hours as needed for pain.  [name] alum & mag hydroxide-simeth (MAALOX/MYLANTA) 200-200-20 MG/5ML suspension Take 10 mLs by mouth every 4 (four) hours as  needed for indigestion or heartburn.   . Amino Acids-Protein Hydrolys (FEEDING SUPPLEMENT, PRO-STAT SUGAR FREE 64,) LIQD Take 30 mLs by mouth daily.  [name] ammonium lactate (LAC-HYDRIN) 12 % lotion Apply 1 application topically 2 (two) times daily. Apply to feet  . atorvastatin (LIPITOR) 40 MG tablet Take 1 tablet (40 mg total) by mouth at bedtime.  . carvedilol (COREG) 12.5 MG tablet Take 1 tablet (12.5 mg total) by mouth 2 (two) times daily with a meal.  . diclofenac sodium (VOLTAREN) 1 % GEL Apply 2 g topically 2 (two) times a day. To both knees  . docusate sodium (COLACE) 100 MG capsule Take 1 capsule (100 mg total) by mouth 2 (two) times daily.  [name] FLUoxetine (PROZAC) 40 MG capsule Take 1 capsule by mouth once daily for depression and anxiety.  . furosemide (LASIX) 20 MG tablet Take 3 tablets (60 mg total) by mouth daily.  . hydrALAZINE (APRESOLINE) 50 MG tablet Take 2 tablets (100  mg total) by mouth 3 (three) times daily.  . Hydrocortisone (GERHARDT'S BUTT CREAM) CREA Apply in a thin layer after cleansing to perineal area, posterior and medial thighs and buttocks.  Turn from side to side with a pillow between knees to promote air-flow  . loperamide (IMODIUM) 2 MG capsule Take 2 mg by mouth 4 (four) times daily as needed for diarrhea or loose stools.   . magnesium oxide (MAG-OX) 400 MG tablet Take 400 mg by mouth daily.  . Melatonin 5 MG TABS Take 5 mg by mouth at bedtime.  . Multiple Vitamin (MULTIVITAMIN) tablet Take 1 tablet by mouth daily.  [name] omega-3 acid ethyl esters (LOVAZA) 1 g capsule TAKE 1 CAPSULE BY MOUTH ONCE DAILY WITH FOOD FOR CHOLESTEROL (Patient taking differently: Take 1 g by mouth. TAKE 1 CAPSULE BY MOUTH ONCE DAILY WITH FOOD FOR CHOLESTEROL)  . omeprazole (PRILOSEC) 40 MG capsule Take 1 capsule (40 mg total) by mouth daily.  . ondansetron (ZOFRAN) 4 MG tablet Take 4 mg by mouth every 8 (eight) hours as needed for nausea or vomiting.  . OXYCODONE ER PO Take 5 mg by mouth 4 (four)  times daily.  . polyethylene glycol (MIRALAX / GLYCOLAX) 17 g packet Take 17 g by mouth 2 (two) times daily.  [name] rOPINIRole (REQUIP) 0.25 MG tablet Take 0.25 mg by mouth at bedtime.  . saccharomyces boulardii (FLORASTOR) 250 MG capsule Take 250 mg by mouth 2 (two) times daily.  . sucralfate (CARAFATE) 1 g tablet Take 1 g by mouth 4 (four) times daily -  with meals and at bedtime.  . thiamine (VITAMIN B-1) 50 MG tablet Take 100 mg by mouth daily.  . vitamin B-12 (CYANOCOBALAMIN) 250 MCG tablet Take 250 mcg by mouth daily.  [name] VITAMIN D, CHOLECALCIFEROL, PO Take 400 Int'l Units by mouth daily.   No facility-administered encounter medications on file as of 11/11/2019.    PHYSICAL EXAM / ROS:   Current and past weights: 227.5 lbs, 10/1018 243 lbs; 16 lbs loss in 1 month, 7% General: NAD, frail appearing, obese Cardiovascular: no chest pain reported, no edema reported Pulmonary: no cough, no increased SOB, room air Abdomen: appetite poor, denies constipation, incontinent of bowel GU: denies dysuria, incontinent of urine MSK:  no joint deformities, non ambulatory/non wt bearing Skin: no rashes or wounds reported Neurological: Weakness, states increased sleep. Denies pain today, well managed on current regimen.  Jason Coop, NP [facility]

## 2019-11-19 ENCOUNTER — Other Ambulatory Visit: Payer: Self-pay

## 2019-11-19 NOTE — Patient Outreach (Signed)
[facility]) Care Management  11/19/2019  Megan Obrien [birth year] [id number]   Medication Adherence call to Mrs. Megan Obrien, Megan Obrien is at a home care facility at this time  Megan Obrien is showing past due on Lisinopril 10 mg under [facility].  [facility] Management Direct Dial [phone]  Fax [phone] [email or web address]

## 2019-11-22 DIAGNOSIS — M6282 Rhabdomyolysis: Secondary | ICD-10-CM | POA: Diagnosis not present

## 2019-11-22 DIAGNOSIS — E119 Type 2 diabetes mellitus without complications: Secondary | ICD-10-CM | POA: Diagnosis not present

## 2019-11-22 DIAGNOSIS — K922 Gastrointestinal hemorrhage, unspecified: Secondary | ICD-10-CM | POA: Diagnosis not present

## 2019-11-22 DIAGNOSIS — I10 Essential (primary) hypertension: Secondary | ICD-10-CM | POA: Diagnosis not present

## 2019-11-22 DIAGNOSIS — Z8673 Personal history of transient ischemic attack (TIA), and cerebral infarction without residual deficits: Secondary | ICD-10-CM | POA: Diagnosis not present

## 2019-11-24 DIAGNOSIS — R319 Hematuria, unspecified: Secondary | ICD-10-CM | POA: Diagnosis not present

## 2019-11-24 DIAGNOSIS — S82831A Other fracture of upper and lower end of right fibula, initial encounter for closed fracture: Secondary | ICD-10-CM | POA: Diagnosis not present

## 2019-11-24 DIAGNOSIS — M19011 Primary osteoarthritis, right shoulder: Secondary | ICD-10-CM | POA: Diagnosis not present

## 2019-11-24 DIAGNOSIS — S42291A Other displaced fracture of upper end of right humerus, initial encounter for closed fracture: Secondary | ICD-10-CM | POA: Diagnosis not present

## 2019-11-24 DIAGNOSIS — W1839XA Other fall on same level, initial encounter: Secondary | ICD-10-CM | POA: Diagnosis not present

## 2019-11-24 DIAGNOSIS — S72401A Unspecified fracture of lower end of right femur, initial encounter for closed fracture: Secondary | ICD-10-CM | POA: Diagnosis not present

## 2019-11-24 DIAGNOSIS — M1611 Unilateral primary osteoarthritis, right hip: Secondary | ICD-10-CM | POA: Diagnosis not present

## 2019-11-24 DIAGNOSIS — M85811 Other specified disorders of bone density and structure, right shoulder: Secondary | ICD-10-CM | POA: Diagnosis not present

## 2019-11-24 DIAGNOSIS — N390 Urinary tract infection, site not specified: Secondary | ICD-10-CM | POA: Diagnosis not present

## 2019-12-01 DIAGNOSIS — N390 Urinary tract infection, site not specified: Secondary | ICD-10-CM | POA: Diagnosis not present

## 2019-12-01 DIAGNOSIS — Z79899 Other long term (current) drug therapy: Secondary | ICD-10-CM | POA: Diagnosis not present

## 2019-12-01 DIAGNOSIS — R319 Hematuria, unspecified: Secondary | ICD-10-CM | POA: Diagnosis not present

## 2019-12-02 ENCOUNTER — Non-Acute Institutional Stay: Payer: Medicare Other | Admitting: Primary Care

## 2019-12-02 ENCOUNTER — Other Ambulatory Visit: Payer: Self-pay

## 2019-12-02 DIAGNOSIS — Z515 Encounter for palliative care: Secondary | ICD-10-CM
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
## 2019-12-10 DIAGNOSIS — D649 Anemia, unspecified: Secondary | ICD-10-CM | POA: Diagnosis not present

## 2019-12-10 DIAGNOSIS — R6889 Other general symptoms and signs: Secondary | ICD-10-CM | POA: Diagnosis not present

## 2019-12-10 DIAGNOSIS — Z79899 Other long term (current) drug therapy: Secondary | ICD-10-CM | POA: Diagnosis not present

## 2019-12-10 DIAGNOSIS — R319 Hematuria, unspecified: Secondary | ICD-10-CM | POA: Diagnosis not present

## 2019-12-10 DIAGNOSIS — N390 Urinary tract infection, site not specified: Secondary | ICD-10-CM | POA: Diagnosis not present

## 2019-12-10 DIAGNOSIS — I10 Essential (primary) hypertension: Secondary | ICD-10-CM | POA: Diagnosis not present

## 2019-12-11 DIAGNOSIS — R52 Pain, unspecified: Secondary | ICD-10-CM | POA: Diagnosis not present

## 2019-12-11 DIAGNOSIS — D649 Anemia, unspecified: Secondary | ICD-10-CM | POA: Diagnosis not present

## 2019-12-11 DIAGNOSIS — R451 Restlessness and agitation: Secondary | ICD-10-CM | POA: Diagnosis not present

## 2019-12-14 ENCOUNTER — Other Ambulatory Visit: Payer: Self-pay

## 2019-12-14 DIAGNOSIS — D649 Anemia, unspecified: Secondary | ICD-10-CM | POA: Diagnosis not present

## 2019-12-14 DIAGNOSIS — R6889 Other general symptoms and signs: Secondary | ICD-10-CM | POA: Diagnosis not present

## 2019-12-14 NOTE — Patient Outreach (Signed)
[facility]) Care Management  12/14/2019  CONGETTA ODRISCOLL Sep 07, 1946 [id number]   Medication Adherence call to Mrs. Charmane Loudenslager patient is at nursing home at this time and they are proving with all her medications. Mrs. Heward is showing past due under [place].   [facility] Management Direct Dial [phone]  Fax [phone] Tirth [email or web address]

## 2019-12-15 DIAGNOSIS — N390 Urinary tract infection, site not specified: Secondary | ICD-10-CM | POA: Diagnosis not present

## 2019-12-15 DIAGNOSIS — R319 Hematuria, unspecified: Secondary | ICD-10-CM | POA: Diagnosis not present

## 2019-12-21 DIAGNOSIS — E119 Type 2 diabetes mellitus without complications: Secondary | ICD-10-CM | POA: Diagnosis not present

## 2019-12-21 DIAGNOSIS — S72491A Other fracture of lower end of right femur, initial encounter for closed fracture: Secondary | ICD-10-CM | POA: Diagnosis not present

## 2019-12-21 DIAGNOSIS — I482 Chronic atrial fibrillation, unspecified: Secondary | ICD-10-CM | POA: Diagnosis not present

## 2019-12-21 DIAGNOSIS — R451 Restlessness and agitation: Secondary | ICD-10-CM | POA: Diagnosis not present

## 2019-12-28 DIAGNOSIS — R451 Restlessness and agitation: Secondary | ICD-10-CM | POA: Diagnosis not present

## 2019-12-28 DIAGNOSIS — E119 Type 2 diabetes mellitus without complications: Secondary | ICD-10-CM | POA: Diagnosis not present

## 2019-12-28 DIAGNOSIS — I482 Chronic atrial fibrillation, unspecified: Secondary | ICD-10-CM | POA: Diagnosis not present

## 2019-12-28 DIAGNOSIS — S72491A Other fracture of lower end of right femur, initial encounter for closed fracture: Secondary | ICD-10-CM | POA: Diagnosis not present

## 2020-01-06 ENCOUNTER — Telehealth: Payer: Self-pay | Admitting: Primary Care

## 2020-01-06 NOTE — Telephone Encounter (Signed)
Patient was discharged from Palliative on 12/30/19 due to an insurance change, and by request of POA [name], via [employer] SW at [facility].

## 2020-02-03 ENCOUNTER — Other Ambulatory Visit: Payer: Self-pay | Admitting: Gastroenterology

## 2020-02-04 ENCOUNTER — Other Ambulatory Visit: Payer: Self-pay | Admitting: Gastroenterology

## 2020-02-04 DIAGNOSIS — R1084 Generalized abdominal pain: Secondary | ICD-10-CM

## 2020-02-04 DIAGNOSIS — D62 Acute posthemorrhagic anemia: Secondary | ICD-10-CM

## 2020-02-16 ENCOUNTER — Ambulatory Visit: Payer: Medicare Other | Admitting: Urology

## 2020-02-16 ENCOUNTER — Encounter: Payer: Self-pay | Admitting: Urology

## 2020-02-16 NOTE — Progress Notes (Incomplete)
02/16/20 12:17 AM   Megan Obrien 03-14-1946 [id number]  Referring provider: [name], MD [place],  [name] [address] No chief complaint on file.   HPI: Megan Obrien is a 74 y.o. F who presents today for the evaluation and management of rUTIs.   UA from 09/25/19 revealed >50 RBC, 22 WBC and >50 bacteria.   +Urine culture 01/01/2019 indicative of E. Coli  07/24/2016 indicative of K. pneumoniae resistant to ampicillin   PMH: Past Medical History:  Diagnosis Date  . (HFpEF) heart failure with preserved ejection fraction ([place])    a. 03/2017 Echo: EF 55-60%, no rwma, Gr1 DD, mild to mod MS, mod to sev dil LA; b. 08/2019 Echo: EF 60-65%, PA 48/27(34). Nl RV fxn. Mod dil LA. Mild MR, mod MS. Triv TR. Mild to mod Ao Sclerosis w/o stenosis. Mild PR.  [name] Arthritis   . Carotid arterial disease ([name])    a. 11/2017 CTA Head/Neck: RICA 45, LICA 60.  [name] Cerebrovascular disease    a. 11/2017 MRA: mod to sev stenosis of bilat PCA's P2 segments; b. 11/2017 CTA Head/Neck: RICA 45, LICA 60, sev bilat distal MCA branch stenoses w/ sev bilat P2 stenoses.  . CKD (chronic kidney disease), stage III   . COVID-19 08/2019  . Depression   . GERD (gastroesophageal reflux disease)   . GIB (gastrointestinal bleeding)    a. 08/2019 in setting of INR>10.  . Hyperlipidemia   . Hypertension   . Left Atrial Appendage Thrombus    a. 11/2017 TEE (performed in setting of stroke/aphasia): nl EF, mod MS, dil LA w/ heavy smoke and small LAA thrombus-->Warfarin initiated.  . Mitral stenosis    a. Mild to moderate by echo 03/2017; b. 03/2017 R/LHC: PCWP 70mmHg, LVEDP 67mmHg. Mean grad of 41mmHg. Valve area of 2.02cm^2; c. 11/2017 TEE: Mod MS.  . Morbid obesity ([place])   . Non-obstructive CAD (coronary artery disease)    a. 03/2017 Cath: mild, non-obstructive CAD.  [name] OSA (obstructive sleep apnea)    a. Previously wore CPAP for several years but stopped on her own and now just  uses O2 via [place] @ night.  [name] PAH (pulmonary artery hypertension) ([place])    a. 03/2017 RHC: PA 19mmHg.  [name] Persistent atrial fibrillation ([name])   . Seasonal allergies   . Stroke [facility])    a. 11/2017 - presented w/ aphasia->MRI acute to early subacute ischemia w/in multiple vascular territories, largest in R frontal & L parietal lobes. Smaller foci of ischemia w/in R hippocampus, R parietal lobe, and L peripheral postcentral gyrus.  . Type 2 diabetes mellitus ([place])   . Urinary incontinence     Surgical History: Past Surgical History:  Procedure Laterality Date  . BACK SURGERY    . CARDIAC CATHETERIZATION    . KNEE SURGERY Left   . RIGHT/LEFT HEART CATH AND CORONARY ANGIOGRAPHY Bilateral 04/25/2017   Procedure: Right/Left Heart Cath and Coronary Angiography;  Surgeon: Wellington Hampshire, MD;  Location: [facility];  Service: Cardiovascular;  Laterality: Bilateral;  . TEE WITHOUT CARDIOVERSION N/A 12/20/2017   Procedure: TRANSESOPHAGEAL ECHOCARDIOGRAM (TEE);  Surgeon: Wellington Hampshire, MD;  Location: [facility] ORS;  Service: Cardiovascular;  Laterality: N/A;  . TONSILLECTOMY AND ADENOIDECTOMY  1952    Home Medications:  Allergies as of 02/16/2020   No Known Allergies     Medication List       Accurate as of Feb 16, 2020 12:17 AM.  If you have any questions, ask your nurse or doctor.        acetaminophen 650 MG CR tablet Commonly known as: TYLENOL Take 650 mg by mouth every 8 (eight) hours as needed for pain.   ammonium lactate 12 % lotion Commonly known as: LAC-HYDRIN Apply 1 application topically 2 (two) times daily. Apply to feet   atorvastatin 40 MG tablet Commonly known as: LIPITOR Take 1 tablet (40 mg total) by mouth at bedtime.   carvedilol 25 MG tablet Commonly known as: COREG Take 25 mg by mouth 2 (two) times daily with a meal.   ciclopirox 0.77 % cream Commonly known as: LOPROX Apply 1 application topically daily. Apply to bilateral toenails.   docusate sodium  100 MG capsule Commonly known as: COLACE Take 1 capsule (100 mg total) by mouth 2 (two) times daily.   feeding supplement (PRO-STAT SUGAR FREE 64) Liqd Take 30 mLs by mouth daily.   FLUoxetine 40 MG capsule Commonly known as: PROZAC Take 1 capsule by mouth once daily for depression and anxiety.   furosemide 20 MG tablet Commonly known as: LASIX Take 3 tablets (60 mg total) by mouth daily.   Gerhardt's butt cream Crea Apply in a thin layer after cleansing to perineal area, posterior and medial thighs and buttocks.  Turn from side to side with a pillow between knees to promote air-flow   hydrALAZINE 50 MG tablet Commonly known as: APRESOLINE Take 2 tablets (100 mg total) by mouth 3 (three) times daily.   loperamide 2 MG capsule Commonly known as: IMODIUM Take 2 mg by mouth 4 (four) times daily as needed for diarrhea or loose stools.   magnesium oxide 400 MG tablet Commonly known as: MAG-OX Take 400 mg by mouth daily.   melatonin 5 MG Tabs Take 5 mg by mouth at bedtime.   multivitamin tablet Take 1 tablet by mouth daily.   omega-3 acid ethyl esters 1 g capsule Commonly known as: LOVAZA TAKE 1 CAPSULE BY MOUTH ONCE DAILY WITH FOOD FOR CHOLESTEROL What changed:   how much to take  how to take this   omeprazole 40 MG capsule Commonly known as: PRILOSEC Take 1 capsule (40 mg total) by mouth daily.   ondansetron 4 MG tablet Commonly known as: ZOFRAN Take 4 mg by mouth every 8 (eight) hours as needed for nausea or vomiting.   OXYCODONE ER PO Take 5 mg by mouth 4 (four) times daily.   polyethylene glycol 17 g packet Commonly known as: MIRALAX / GLYCOLAX Take 17 g by mouth 2 (two) times daily.   rOPINIRole 0.25 MG tablet Commonly known as: REQUIP Take 0.25 mg by mouth at bedtime.   saccharomyces boulardii 250 MG capsule Commonly known as: FLORASTOR Take 250 mg by mouth 2 (two) times daily.   sucralfate 1 g tablet Commonly known as: CARAFATE Take 1 g by mouth  4 (four) times daily -  with meals and at bedtime.   thiamine 50 MG tablet Commonly known as: VITAMIN B-1 Take 100 mg by mouth daily.   vitamin B-12 250 MCG tablet Commonly known as: CYANOCOBALAMIN Take 250 mcg by mouth daily.   VITAMIN D (CHOLECALCIFEROL) PO Take 400 Int'l Units by mouth daily.   Voltaren 1 % Gel Generic drug: diclofenac sodium Apply 2 g topically 2 (two) times a day. To both knees       Allergies: No Known Allergies  Family History: Family History  Problem Relation Age of Onset  . Lung cancer  Father   . Alzheimer's disease Mother   . Stroke Maternal Grandfather     Social History:  reports that she has never smoked. She has never used smokeless tobacco. She reports that she does not drink alcohol or use drugs.   Physical Exam: There were no vitals taken for this visit.  Constitutional:  Alert and oriented, No acute distress. HEENT: [place] AT, moist mucus membranes.  Trachea midline, no masses. Cardiovascular: No clubbing, cyanosis, or edema. Respiratory: Normal respiratory effort, no increased work of breathing. GI: Abdomen is soft, nontender, nondistended, no abdominal masses GU: No CVA tenderness Lymph: No cervical or inguinal lymphadenopathy. Skin: No rashes, bruises or suspicious lesions. Neurologic: Grossly intact, no focal deficits, moving all 4 extremities. Psychiatric: Normal mood and affect.  Laboratory Data:   Urinalysis   Pertinent Imaging:   Assessment & Plan:     No follow-ups on file.  [facility] [address] [place], [place] [address] [phone]  I, Lucas Mallow, am acting as a scribe for Dr. Hollice Espy,  {Add Scribe Attestation Statement}

## 2020-02-18 ENCOUNTER — Ambulatory Visit: Admission: RE | Admit: 2020-02-18 | Payer: Medicare Other | Source: Ambulatory Visit

## 2020-03-01 DEATH — deceased

## 2020-05-12 ENCOUNTER — Ambulatory Visit: Payer: Medicare Other | Admitting: Cardiovascular Disease

## 2020-10-06 IMAGING — CT CT HIP*R* W/O CM
2 of 4 series · 17 of 46 positions shown, 19 images · non-contrast
Comparison: Radiographs dated 09/24/2019

CLINICAL DATA: Trauma secondary to a fall. Abnormal radiographs of
the right hip.

EXAM:
CT OF THE RIGHT HIP WITHOUT CONTRAST
TECHNIQUE: Multidetector CT imaging of the right hip was performed according to
the standard protocol. Multiplanar CT image reconstructions were
also generated.

[Series 3: axial st · axial · 0.47mm/px · z∈[-1298,-1114]mm · 14 of 106 slices shown, 16 images]
[im 7/106  soft-tissue]
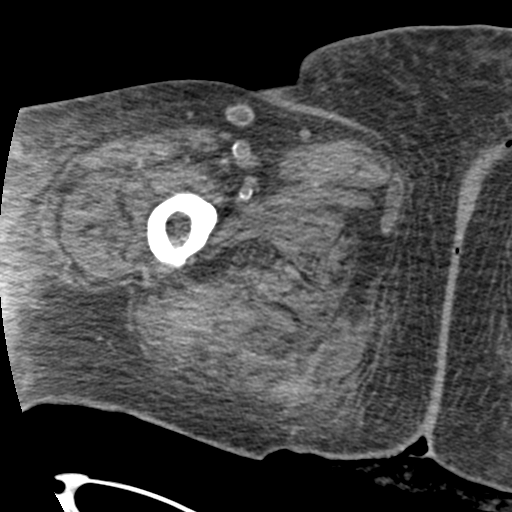
[im 7/106  bone]
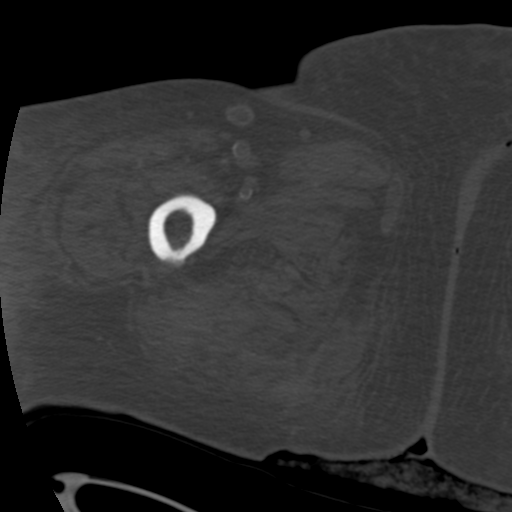
[im 14/106  soft-tissue]
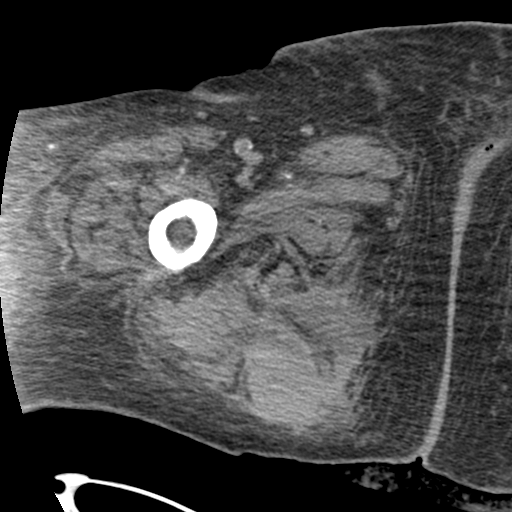
[im 21/106  soft-tissue]
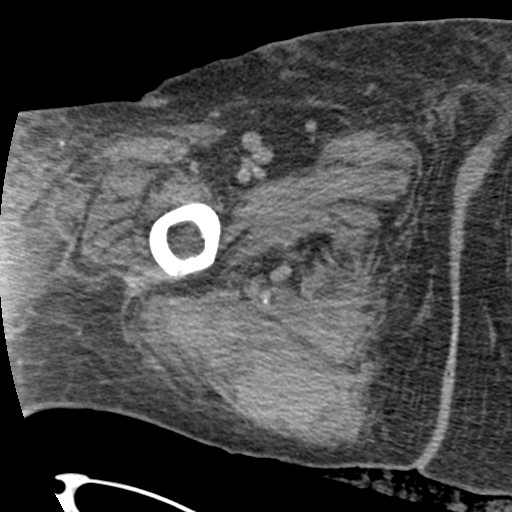
[im 28/106  soft-tissue]
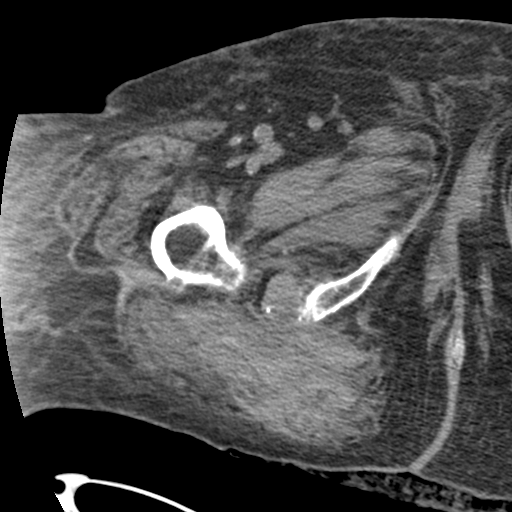
[im 34/106  soft-tissue]
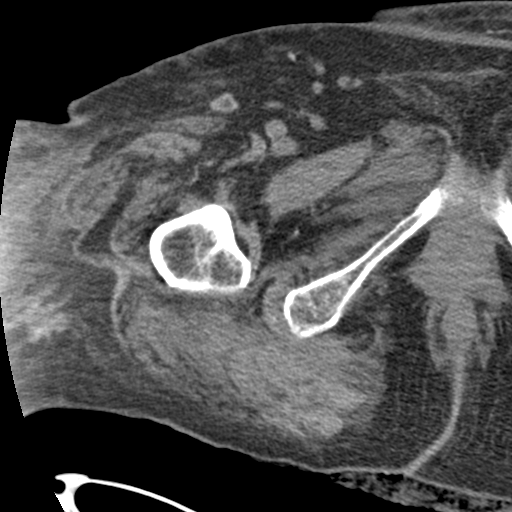
[im 41/106  soft-tissue]
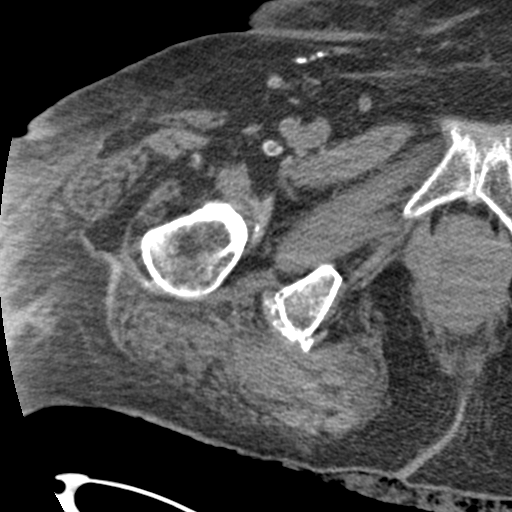
[im 48/106  soft-tissue]
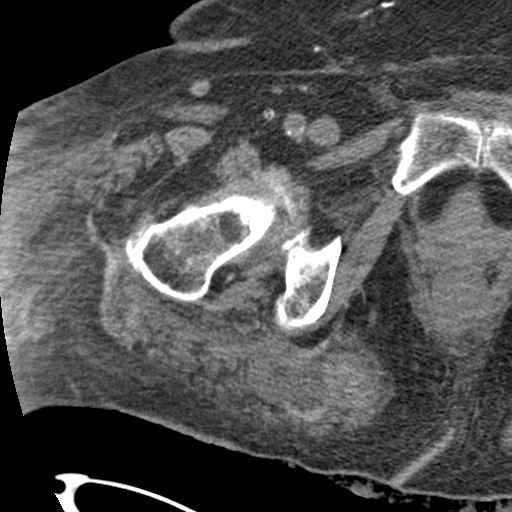
[im 58/106  soft-tissue]
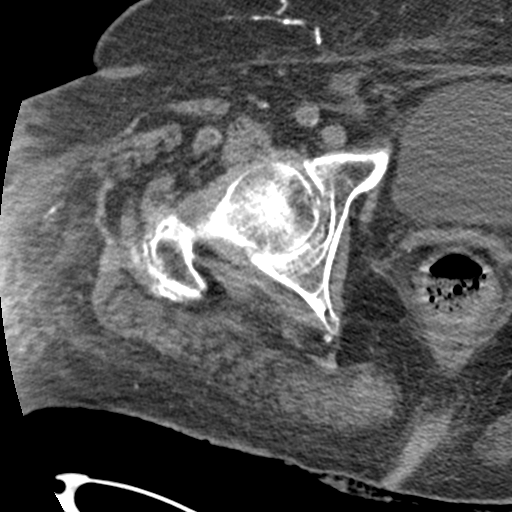
[im 65/106  soft-tissue]
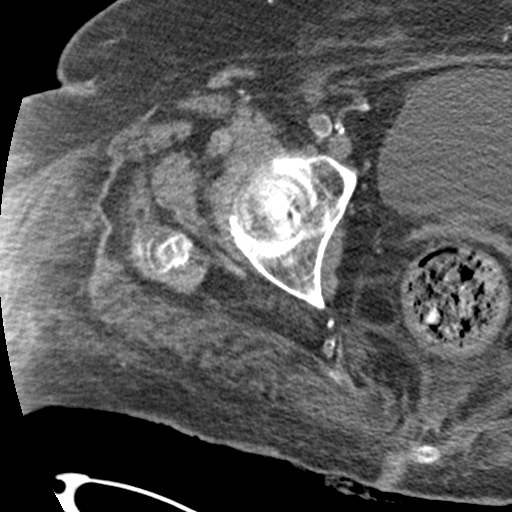
[im 65/106  bone]
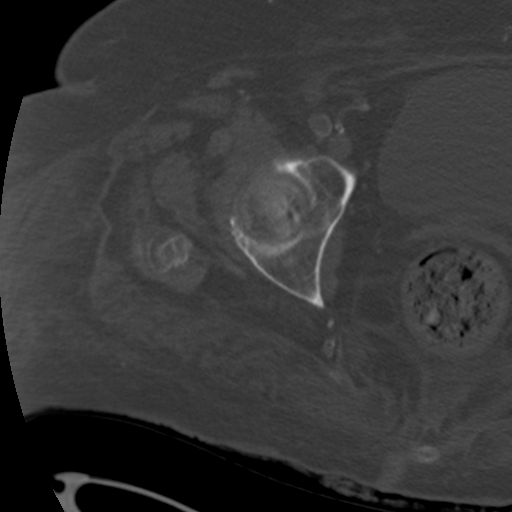
[im 72/106  soft-tissue]
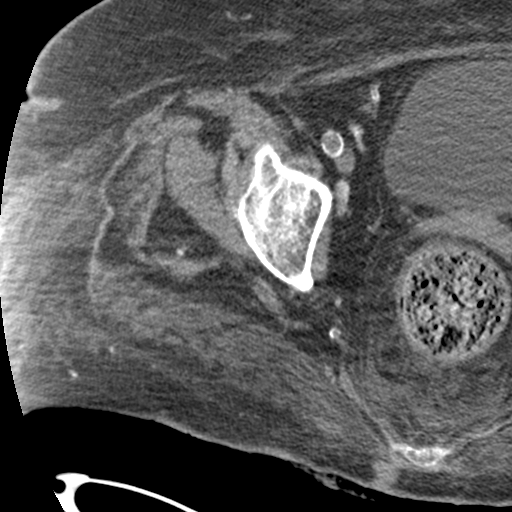
[im 78/106  soft-tissue]
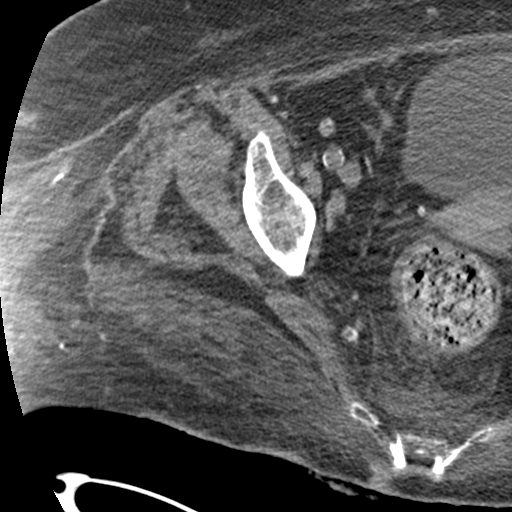
[im 85/106  soft-tissue]
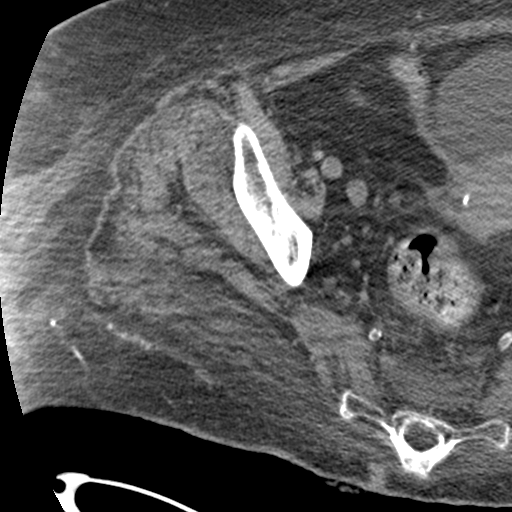
[im 92/106  soft-tissue]
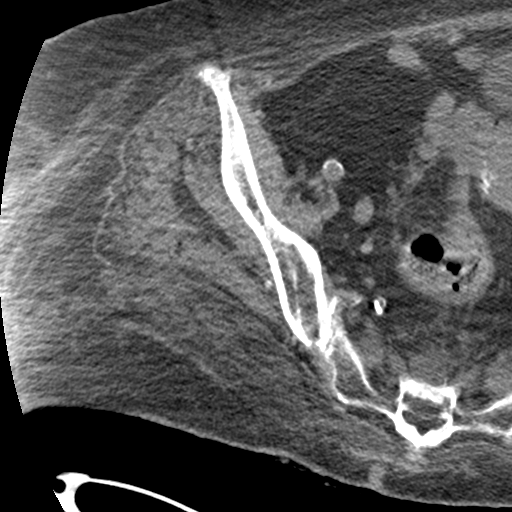
[im 99/106  soft-tissue]
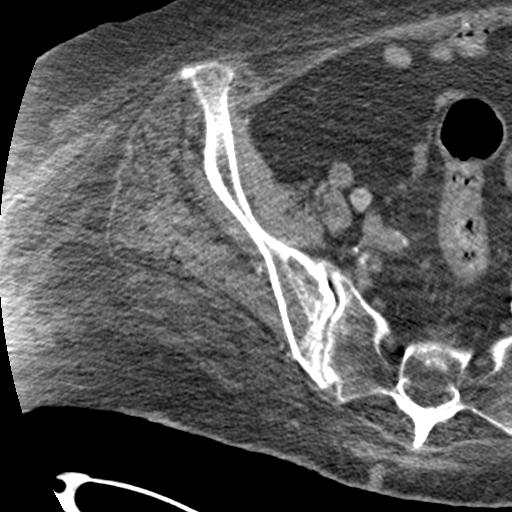

[Series 6: coronal st · coronal · 0.48mm/px · 3 of 112 slices shown]
[im 23/112  soft-tissue]
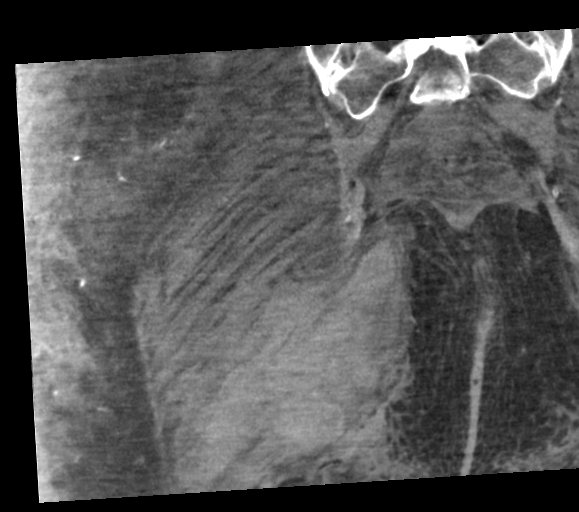
[im 45/112  soft-tissue]
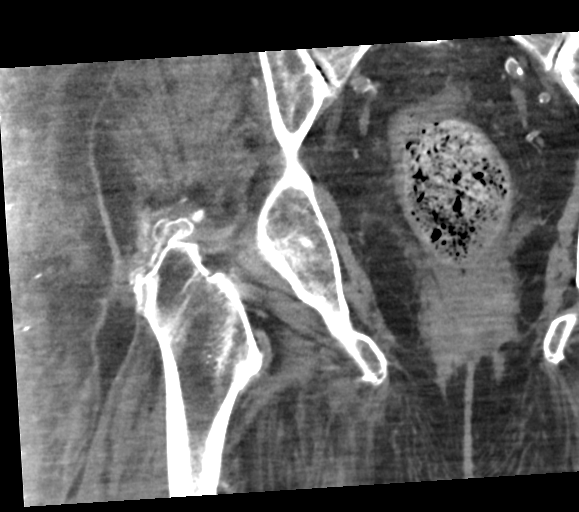
[im 67/112  soft-tissue]
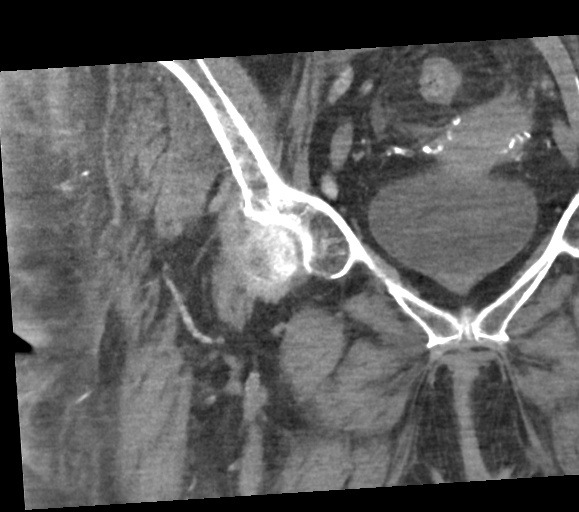

[17 of 46 positions shown; findings below may reference images not displayed]

FINDINGS: Bones/Joint/Cartilage

There is no fracture or dislocation of the bones of the right hip or
of the visualized bones of the right side of the pelvis. The tiny
irregular lucency seen on radiographs is therefore artifactual.

Muscles and Tendons

There is a prominent hematoma in the right gluteus maximus muscle.
The hematoma measures 14 by 6 by 6 cm.

Soft tissues

Edema in the subcutaneous fat lateral to the right hip.
IMPRESSION: 1. Large hematoma in the right gluteus maximus muscle.
2. No fracture or dislocation of the right hip or of the visualized
bones of the right side of the pelvis. The tiny irregular lucency
seen on radiographs is therefore artifactual.

## 2020-10-06 IMAGING — CR DG HUMERUS 2V *R*
4 series · 4 of 4 positions shown · non-contrast
Comparison: None

CLINICAL DATA: Unwitnessed fall

EXAM:
RIGHT HUMERUS - 2+ VIEW

[humerus ap (1 of 2)]
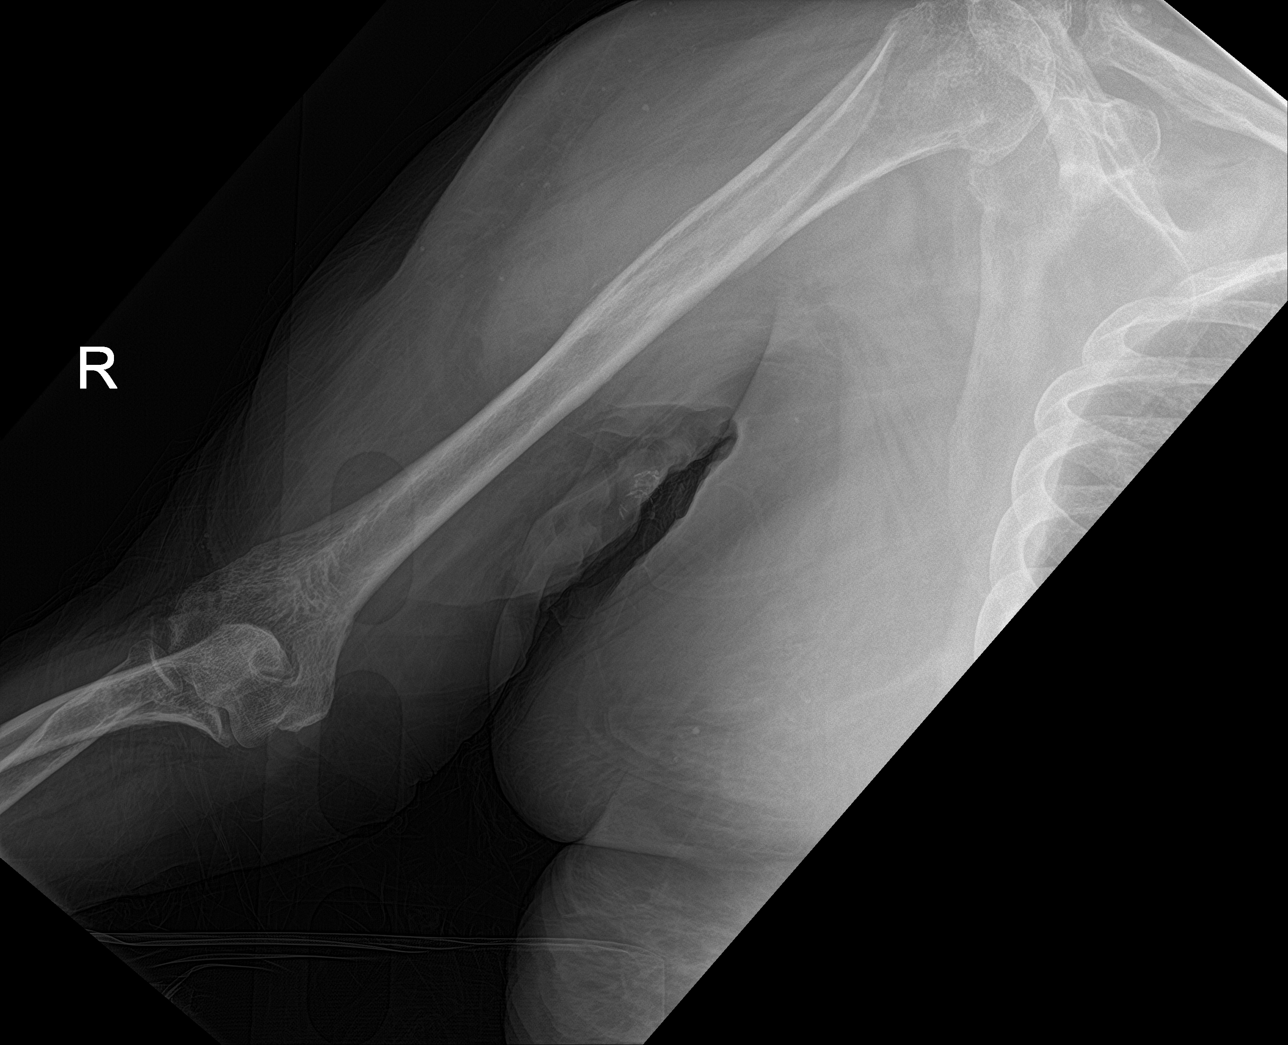

[humerus lat (1 of 2)]
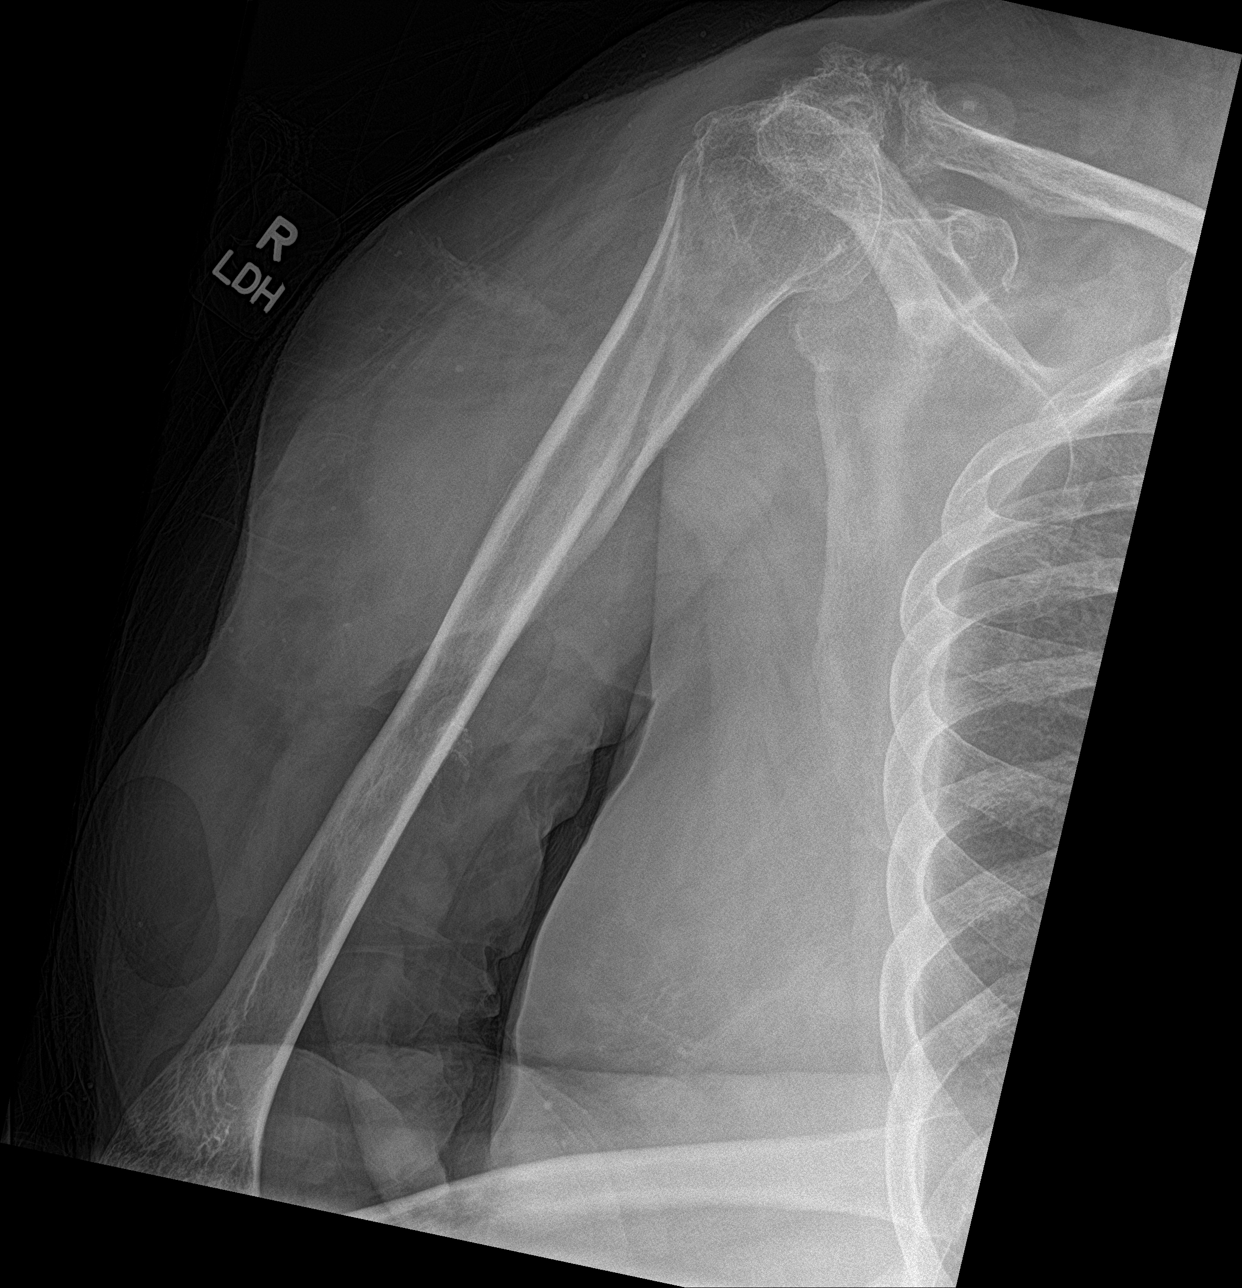

[humerus lat (2 of 2)]
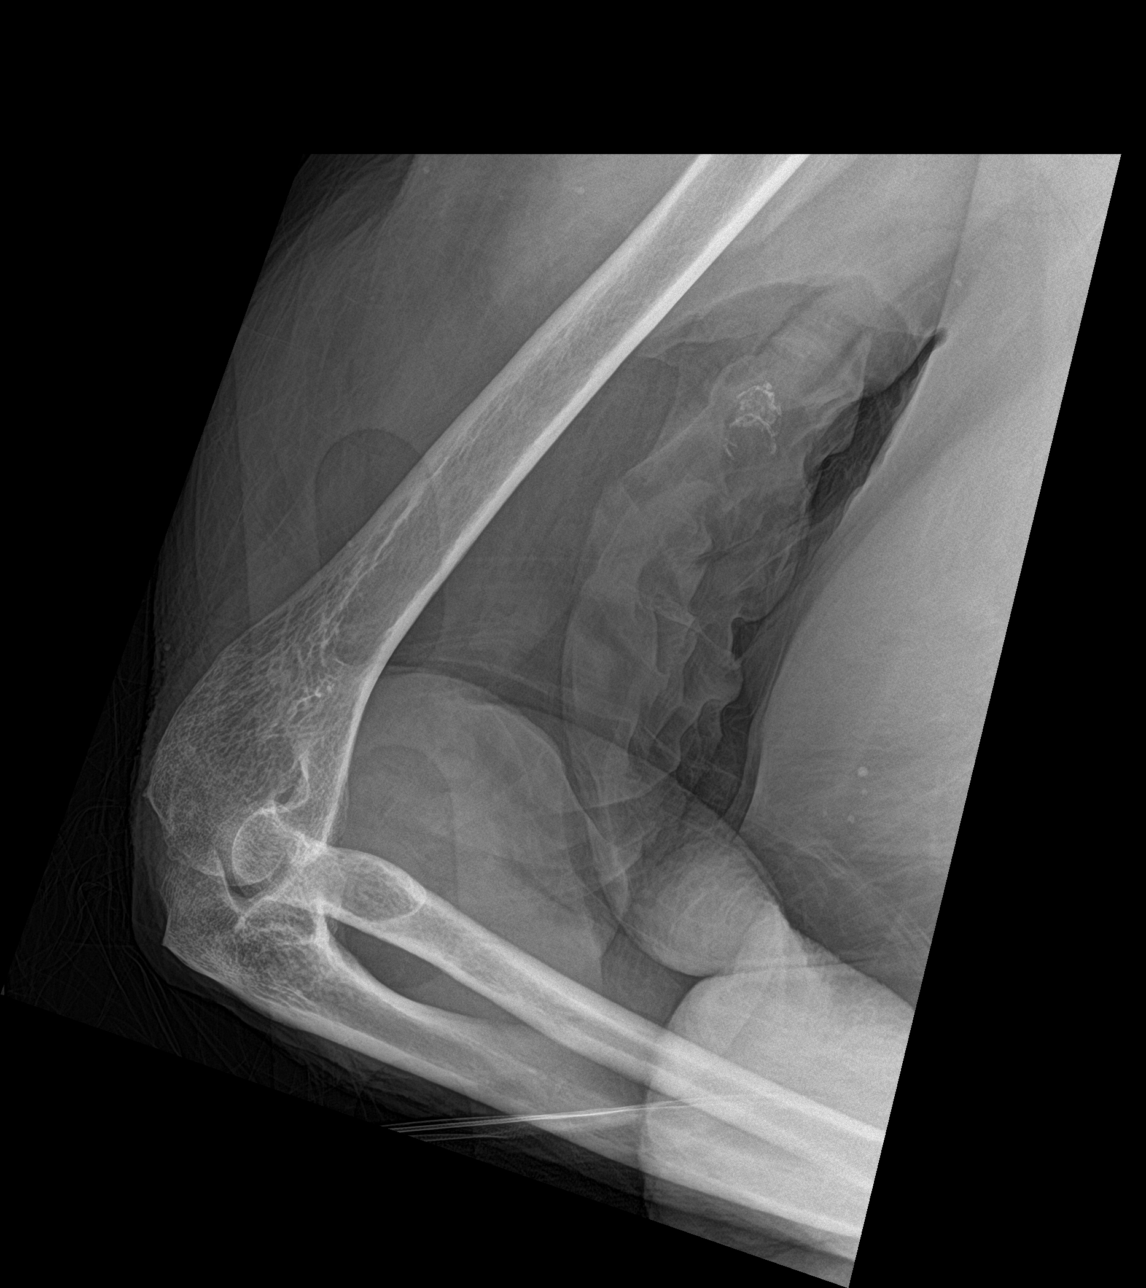

[humerus ap (2 of 2)]
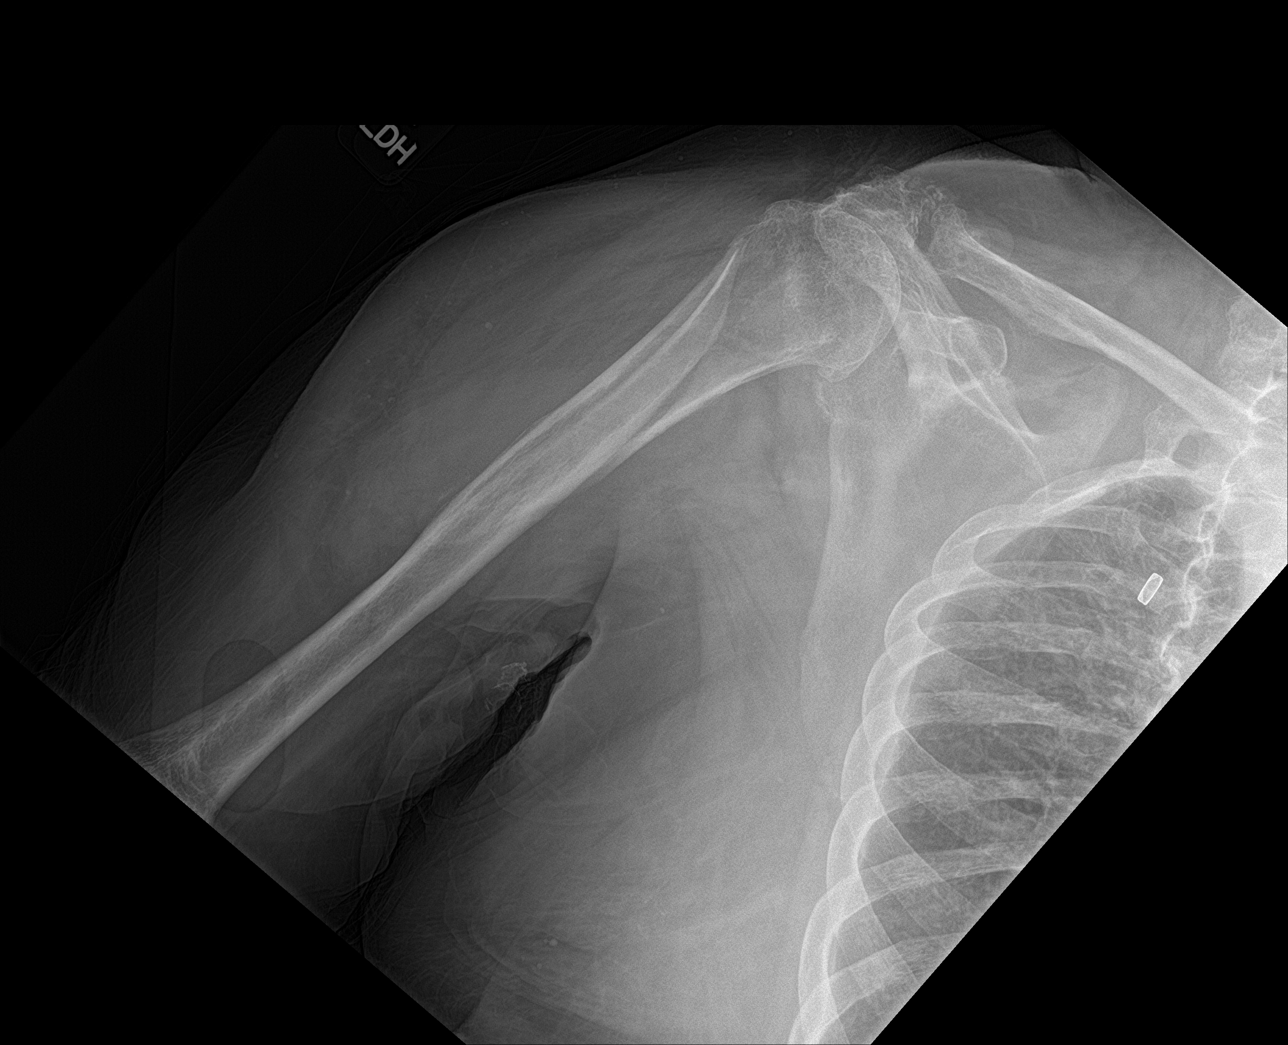

[4 of 4 positions shown; findings below may reference images not displayed]

FINDINGS: Oblique fracture noted through the proximal to mid right humeral
shaft. Minimal displacement. Advanced degenerative changes in the
right AC and glenohumeral joints.
IMPRESSION: Oblique minimally displaced proximal and mid humeral shaft fracture.
